# Patient Record
Sex: Male | Born: 1945
Health system: Southern US, Community
[De-identification: ages and names within clinical notes are randomized; demographics above are authoritative.]

## PROBLEM LIST (undated history)

## (undated) DIAGNOSIS — N209 Urinary calculus, unspecified: Secondary | ICD-10-CM

## (undated) DIAGNOSIS — B743 Loiasis: Secondary | ICD-10-CM

## (undated) DIAGNOSIS — G4730 Sleep apnea, unspecified: Secondary | ICD-10-CM

## (undated) DIAGNOSIS — J40 Bronchitis, not specified as acute or chronic: Secondary | ICD-10-CM

## (undated) DIAGNOSIS — M109 Gout, unspecified: Secondary | ICD-10-CM

## (undated) DIAGNOSIS — J449 Chronic obstructive pulmonary disease, unspecified: Secondary | ICD-10-CM

## (undated) DIAGNOSIS — R7303 Prediabetes: Secondary | ICD-10-CM

## (undated) DIAGNOSIS — I499 Cardiac arrhythmia, unspecified: Secondary | ICD-10-CM

## (undated) DIAGNOSIS — M1712 Unilateral primary osteoarthritis, left knee: Secondary | ICD-10-CM

## (undated) DIAGNOSIS — M5410 Radiculopathy, site unspecified: Secondary | ICD-10-CM

## (undated) DIAGNOSIS — H269 Unspecified cataract: Secondary | ICD-10-CM

## (undated) DIAGNOSIS — I4891 Unspecified atrial fibrillation: Secondary | ICD-10-CM

## (undated) DIAGNOSIS — I5033 Acute on chronic diastolic (congestive) heart failure: Secondary | ICD-10-CM

## (undated) DIAGNOSIS — Z6841 Body Mass Index (BMI) 40.0 and over, adult: Secondary | ICD-10-CM

## (undated) DIAGNOSIS — J189 Pneumonia, unspecified organism: Secondary | ICD-10-CM

## (undated) DIAGNOSIS — Z96659 Presence of unspecified artificial knee joint: Secondary | ICD-10-CM

## (undated) DIAGNOSIS — M5137 Other intervertebral disc degeneration, lumbosacral region: Secondary | ICD-10-CM

## (undated) DIAGNOSIS — Z87442 Personal history of urinary calculi: Secondary | ICD-10-CM

## (undated) DIAGNOSIS — M51379 Other intervertebral disc degeneration, lumbosacral region without mention of lumbar back pain or lower extremity pain: Secondary | ICD-10-CM

## (undated) DIAGNOSIS — I10 Essential (primary) hypertension: Secondary | ICD-10-CM

## (undated) HISTORY — DX: Loiasis: B74.3

## (undated) HISTORY — DX: Prediabetes: R73.03

## (undated) HISTORY — DX: Presence of unspecified artificial knee joint: Z96.659

## (undated) HISTORY — DX: Unspecified atrial fibrillation: I48.91

## (undated) HISTORY — DX: Acute on chronic diastolic (congestive) heart failure: I50.33

## (undated) HISTORY — DX: Other intervertebral disc degeneration, lumbosacral region: M51.37

## (undated) HISTORY — PX: KNEE ARTHROSCOPY: SUR90

## (undated) HISTORY — DX: Unilateral primary osteoarthritis, left knee: M17.12

## (undated) HISTORY — DX: Morbid (severe) obesity due to excess calories: E66.01

## (undated) HISTORY — DX: Body Mass Index (BMI) 40.0 and over, adult: Z684

## (undated) HISTORY — DX: Radiculopathy, site unspecified: M54.10

## (undated) HISTORY — DX: Chronic obstructive pulmonary disease, unspecified: J44.9

## (undated) HISTORY — DX: Unspecified cataract: H26.9

## (undated) HISTORY — DX: Other intervertebral disc degeneration, lumbosacral region without mention of lumbar back pain or lower extremity pain: M51.379

## (undated) HISTORY — DX: Gout, unspecified: M10.9

## (undated) HISTORY — PX: CHOLECYSTECTOMY: SHX55

---

## 2000-09-05 ENCOUNTER — Encounter (INDEPENDENT_AMBULATORY_CARE_PROVIDER_SITE_OTHER): Payer: Self-pay | Admitting: *Deleted

## 2000-09-05 ENCOUNTER — Other Ambulatory Visit: Admission: RE | Admit: 2000-09-05 | Discharge: 2000-09-05 | Payer: Self-pay | Admitting: Plastic Surgery

## 2001-03-02 ENCOUNTER — Encounter (HOSPITAL_COMMUNITY): Admission: RE | Admit: 2001-03-02 | Discharge: 2001-04-01 | Payer: Self-pay | Admitting: Rheumatology

## 2001-04-19 HISTORY — PX: TOTAL KNEE ARTHROPLASTY: SHX125

## 2001-04-20 ENCOUNTER — Encounter (HOSPITAL_COMMUNITY): Admission: RE | Admit: 2001-04-20 | Discharge: 2001-05-20 | Payer: Self-pay | Admitting: Rheumatology

## 2001-08-10 ENCOUNTER — Encounter (HOSPITAL_COMMUNITY): Admission: RE | Admit: 2001-08-10 | Discharge: 2001-09-09 | Payer: Self-pay | Admitting: Rheumatology

## 2001-10-12 ENCOUNTER — Encounter (HOSPITAL_COMMUNITY): Admission: RE | Admit: 2001-10-12 | Discharge: 2001-11-11 | Payer: Self-pay | Admitting: Rheumatology

## 2001-12-05 ENCOUNTER — Encounter: Payer: Self-pay | Admitting: Orthopedic Surgery

## 2001-12-11 ENCOUNTER — Inpatient Hospital Stay (HOSPITAL_COMMUNITY): Admission: RE | Admit: 2001-12-11 | Discharge: 2001-12-14 | Payer: Self-pay | Admitting: Orthopedic Surgery

## 2002-12-02 ENCOUNTER — Emergency Department (HOSPITAL_COMMUNITY): Admission: EM | Admit: 2002-12-02 | Discharge: 2002-12-03 | Payer: Self-pay | Admitting: *Deleted

## 2004-02-04 ENCOUNTER — Ambulatory Visit (HOSPITAL_COMMUNITY): Admission: RE | Admit: 2004-02-04 | Discharge: 2004-02-04 | Payer: Self-pay | Admitting: Internal Medicine

## 2004-09-11 ENCOUNTER — Encounter: Admission: RE | Admit: 2004-09-11 | Discharge: 2004-09-11 | Payer: Self-pay | Admitting: Sports Medicine

## 2005-06-23 ENCOUNTER — Encounter: Admission: RE | Admit: 2005-06-23 | Discharge: 2005-06-23 | Payer: Self-pay | Admitting: Family Medicine

## 2005-07-07 ENCOUNTER — Encounter: Admission: RE | Admit: 2005-07-07 | Discharge: 2005-07-07 | Payer: Self-pay | Admitting: Family Medicine

## 2005-07-27 ENCOUNTER — Encounter: Admission: RE | Admit: 2005-07-27 | Discharge: 2005-07-27 | Payer: Self-pay | Admitting: Family Medicine

## 2008-02-05 ENCOUNTER — Ambulatory Visit (HOSPITAL_COMMUNITY): Admission: RE | Admit: 2008-02-05 | Discharge: 2008-02-05 | Payer: Self-pay | Admitting: Family Medicine

## 2008-04-17 ENCOUNTER — Encounter: Admission: RE | Admit: 2008-04-17 | Discharge: 2008-04-17 | Payer: Self-pay | Admitting: Neurosurgery

## 2008-12-31 ENCOUNTER — Ambulatory Visit (HOSPITAL_COMMUNITY): Admission: RE | Admit: 2008-12-31 | Discharge: 2008-12-31 | Payer: Self-pay | Admitting: Family Medicine

## 2009-01-28 ENCOUNTER — Encounter: Admission: RE | Admit: 2009-01-28 | Discharge: 2009-03-10 | Payer: Self-pay | Admitting: Anesthesiology

## 2009-11-26 ENCOUNTER — Encounter: Admission: RE | Admit: 2009-11-26 | Discharge: 2009-11-26 | Payer: Self-pay | Admitting: Anesthesiology

## 2010-03-05 ENCOUNTER — Ambulatory Visit (HOSPITAL_COMMUNITY): Admission: RE | Admit: 2010-03-05 | Discharge: 2010-03-05 | Payer: Self-pay | Admitting: Ophthalmology

## 2010-05-10 ENCOUNTER — Encounter: Payer: Self-pay | Admitting: Family Medicine

## 2010-06-30 LAB — BASIC METABOLIC PANEL
Creatinine, Ser: 0.93 mg/dL (ref 0.4–1.5)
GFR calc non Af Amer: >60 mL/min (ref >60)
Glucose, Bld: 81 mg/dL (ref 70–99)
Sodium: 141 mEq/L (ref 135–145)

## 2010-06-30 LAB — HEMOGLOBIN AND HEMATOCRIT, BLOOD: HCT: 42.5 % (ref 39.0–52.0)

## 2010-09-04 NOTE — Op Note (Signed)
NAME:  Garrett Munoz, Garrett Munoz                         ACCOUNT NO.:  [id number]   MEDICAL RECORD NO.:  [id number]                   PATIENT TYPE:  INP   LOCATION:  5024                                 FACILITY:  [facility]   PHYSICIAN:  Elana Alm. Thurston Hole, M.D.              DATE OF BIRTH:  1945-05-03   DATE OF PROCEDURE:  DATE OF DISCHARGE:                                 OPERATIVE REPORT   PREOPERATIVE DIAGNOSIS:  Right knee degenerative joint disease.   POSTOPERATIVE DIAGNOSIS:  Right  knee degenerative joint disease.   PROCEDURE:  1. Right total knee replacement using Osteonix Scorpio total knee system     with a #9 femoral cemented component, a #11 cemented tibial component     with a 15 mm polyethylene tibial spacer, a 28 mm polyethylene cemented     patella.  2. Right knee lateral retinacular release.   SURGEON:  Elana Alm. Thurston Hole, M.D.   ASSISTANT:  Julien Girt, P.A.   ANESTHESIA:  General.   OPERATIVE TIME:  1 hour and 40 minutes.   COMPLICATIONS:  None.   DESCRIPTION OF PROCEDURE:  The patient was brought to the operating room on  December 11, 2001, and placed on the operating table in the supine position.  After an adequate level of general anesthesia was obtained, his right knee  was examined under anesthesia. The range of motion from 0 to 125 degrees, 1  to 2+ crepitation, moderate varus deformity, knee stable ligamentous exam  with normal patellar tracking. He  had a Foley catheter placed  under  sterile conditions and received Ancef 1 gm IV preoperatively for  prophylaxis. Initially the leg was exsanguinated and a thigh tourniquet was  elevated to 375 mm.   A 20 cm longitudinal incision was placed over the patella. The underlying  subcutaneous tissues were incised along with the skin incision. A median  arthrotomy was performed revealing an excessive amount of normal appearing  joint fluid. The articular surfaces were inspected. He had grade 4 changes  medially,  grade 3 and 4 changes laterally and grade 3 and 4 changes in the  patellofemoral joint. Osteophytes were removed from the femoral condyles and  tibial plateau as well as around the patella.   After this was done the medial and lateral meniscal remnants were removed as  well as the anterior cruciate ligament. An intramedullary was then drilled  up the femoral canal for placement of the distal femoral cutting jig which  was placed in the appropriate amount of rotation and the distal 12 mm cut  was made. The proximal tibia was exposed. The tibial spines were removed  with an oscillating saw. The distal  femur was then further sized. A #9 was  felt to be the appropriate size and a #9 cutting jig was placed and then  these cuts were made.   After this was done, the proximal tibia was again  exposed. The tibial  surface was sized and a #11 was found to be the appropriate size. An  intramedullary drill was then drilled down the tibial canal for placement of  the  proximal tibial cutting jig, and a proximal 10  mm cut was made in the  appropriate amount  of rotation and angulation.   After this was done and the Scorpio PCL cutter was placed back on the distal  femur and these cuts were made. At this point the #9 femoral trial was  hammered into position with an excellent fit. The #11 tibial base plate was  placed with a 15 mm polyethylene spacer. This was found to give excellent  stability, range of motion 0 to 125 degrees with no liftoff on the tray, and  excellent correction of his varus deformity.  The tibial baseplate was then  marked for rotation and the keel cut was made.   After this was done the patella was sized. The 28 mm was found to be the  appropriate size and a recessed 10 x 28 mm cut was made and three locking  holes were placed. After this was done, it was felt that all the trial  components were of excellent size, fit and stability. The knee was then jet  lavage irrigated with  3 liters of saline solution. The proximal tibia was  then exposed and the #11 tibial base plate with cement backing was then  hammered into position with an excellent fit with excess cement being  removed from  around the edges. The #9 femoral component with cement backing  was hammered into position, also with an excellent fit,  with excess cement  being removed from around the edges.   The 15 mm polyethylene  spacer was then locked on the tibial base plate. The  knee was taken through the range of motion, 0 to 120 degrees with excellent  stability and no  liftoff on the tray and excellent correction of  his varus  deformity. The 28 mm patella with cement backing was also locked into its  recessed hole and held there with a clamp.   After the cement hardened,  patellofemoral tracking was evaluated.. There  was some lateral tracking noted and lateral patellofemoral tightness, and  thus a lateral retinacular release was carried out, decompressing the  patellofemoral joint and improving the patellar tracking to normal.   After this was  done, it was felt that all the components were of  excellent  size, fit and stability.  The knee was further irrigated with antibiotic solution. The arthrotomy was  then closed with #1 Ethibond sutures over two medium Hemovac drains. The  subcutaneous tissues were closed with 0 and 2-0 Vicryl. The skin was closed  with skin staples. Sterile dressings were applied. The Hemovac was injected  with 0.25% Marcaine with epinephrine and clamped. The tourniquet was  released. The patient then had a femoral nerve block placed by anesthesia  for postoperative pain control.   He was  then awakened and transferred to the recovery room in stable  condition. Needle and  sponge counts were correct x 2 at the end of the  case.                                                  Robert A. Thurston Hole, M.D.  RAW/MEDQ  D:  12/11/2001  T:  12/12/2001  Job:  [id number]

## 2010-09-04 NOTE — Consult Note (Signed)
[facility]  Patient:    Garrett Munoz, Garrett Munoz Visit Number: [id number] MRN: [id number]          Service Type: RHE Location: [facility] Attending Physician:  Aundra Dubin Dictated by:   Nathaneil Canary, M.D. Proc. Date: 10/11/01 Admit Date:  10/12/2001   CC:         Patrica Duel, M.D.   Consultation Report  CHIEF COMPLAINT:  Right knee, suspected gout.  HISTORY:  The patient returns reporting that his right knee remains his worst joint.  He had an injection to the right knee by Dr. Nobie Putnam about two months ago.  He has few days where the knee does not bother him.  This is basically a chronic every day problem.  He was doing fairly well last week but on Sunday the knee flared up.  He has had two aspirations by myself which show negative gout crystals.  He has some pain in the left knee but no other joints are particularly bothering him.  He has been on some prednisone but this has been stopped for about three to four weeks.  MEDICINES: 1. Colchicine 0.6 mg b.i.d. 2. Bextra 10 mg q.d. 3. Glucosamine. 4. Ambien 10 mg h.s.  PHYSICAL EXAMINATION  VITAL SIGNS:  Weight 305 pounds, blood pressure 136/80, respirations 18.  GENERAL:  no distress.  MUSCULOSKELETAL:  Hands, wrists, elbows, shoulders have a good range of motion and show no arthritis.  BACK:  Nontender.  EXTREMITIES:  The right knee is cool and flexes easily to about 120 degrees and shows no tenderness with flexion beyond this.  There was mild, if any, right joint line tenderness.  The ankles and feet were nontender.  ASSESSMENT AND PLAN:  Right knee.  Mr. Labonte has become frustrated with his lack of improvement with his right knee.  I wanted to x-ray the knee but he said that he brought the x-rays at some point in the past.  He may have done this.  I have no record of what was seen on these if I did look at them.  I will try and obtain the MRI report.  We discussed him going to get the  x-rays but he became frustrated and left the office and did not want to make a return appointment.  I was continuing him on colchicine 0.6 mg b.i.d. for suspected gout to other joints.  He was planned to return in two months and we will see if he is interested. Dictated by:   Nathaneil Canary, M.D. Attending Physician:  Aundra Dubin DD:  10/12/01 TD:  10/13/01 Job: [id number]

## 2010-09-04 NOTE — Consult Note (Signed)
[facility]  Patient:    Garrett Munoz, Garrett Munoz Visit Number: [id number] MRN: [id number]          Service Type: RHE Location: [facility] Attending Physician:  Aundra Dubin Dictated by:   Aundra Dubin, M.D. Proc. Date: 03/02/01 Admit Date:  03/02/2001   CC:         Patrica Duel, M.D., [facility]   Consultation Report  CHIEF COMPLAINT:  "Knees."  Dear Loraine Leriche,  Thank you for allowing me to help in Mr. Dawsons care.  He is a 65 year old man who has had a diagnosis of gout for several years.  Over this last year, he feels that he has worsened and in the last three to four months, he has had probably three to four episodes of acute swelling to the right knee.  He has also had a recent MRI of the right knee and he tells me that there is a "tear."  He is scheduled for arthroscopy with Dr. Elana Alm. Thurston Hole for March 22, 2001.  He has never had a swollen big toe.  Ankles have been involved occasionally, as has a wrist.  He remembers at least several episodes of going to bed, having no pain in any joint, and then awakening with a swollen, hurting joint.  REVIEW OF SYSTEMS:  He feels his weight is up 40 pounds over the last year. His energy level is good.  He does not sleep well and snores frequently.  He is quite stiff with a number of joints, especially the knees in the mornings, but has little pain.  He denies fevers, rashes, headaches, chest pain, shortness of breath, constipation, blood or mucus with the bowel movement.  He has had some diarrhea recently.  He has some moderate back pain.  PAST MEDICAL/SURGICAL HISTORY:  History of irregular heartburn, kidney stones, episode of pneumonia, left knee arthroscopy, 2002, cholecystectomy in 1990.  MEDICINES: 1. Voltaren 75 mg q.d. 2. Darvocet-N 100 q.d. 3. Vitamin E. 4. Glucosamine. 5. Chondroitin.  DRUG INTOLERANCES:  None.  FAMILY HISTORY:  His father died at age 75 with lung cancer.   His mother died at age 35 and she had a history of a stroke.  SOCIAL HISTORY:  He is a [place] native.  He has three grown children.  He worked many years and retired at [employer].  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  He completed high school.  He smoked for 20 years and stopped 15 years or so ago.  He has a glass of wine or beer most nights.  PHYSICAL EXAMINATION:  VITAL SIGNS:  Weight 300 pounds.  Height 5 feet 11 inches.  Blood pressure 132/84; respirations 18; pulse 60, irregular.  GENERAL:  He is significantly overweight and is in no distress.  SKIN:  Clear.  HEENT:  PERRL/EOMI.  Mouth:  Dentures.  No obvious ulcers or petechiae.  NECK:  Negative JVD.  LUNGS:  Clear.  HEART:  Regular with no murmur.  ABDOMEN:  Soft, obese, nontender.  MUSCULOSKELETAL:  The hands, wrists, elbows, shoulders and neck have a good range of motion with mild neck stiffness.  Trigger points in the low back were nontender.  Hips:  Good range of motion.  The right knee is swollen, warm and quite tender, with limited range of motion with flexion to 100 degrees.  The left knee is cool and nontender.  The ankles and feet were nonswollen and nontender.  There was no chronic  swelling to the MTPs.  NEUROLOGIC:  Nonfocal.  PROCEDURE:  Right knee aspiration and injection.  DESCRIPTION OF PROCEDURE:  The skin was cleaned with Betadine and alcohol swabs and cooled with ethyl chloride.  Using a 21-gauge needle, approximately 50 cc of mildly hazy-yellow and fairly clear fluid were removed.  Depo-Medrol 80 mg in 1 cc of 2% lidocaine was injected.  ASSESSMENT AND PLAN: 1. Suspected gout.  Hopefully with the above fluid removed from the right    knee, we can diagnose gout.  He likely has two processes going on to the    knee.  There could be chronic gout attacks but also he has the tear that    has been observed on the MRI.     I will treat this as  gout and have placed him on colchicine 0.6 mg b.i.d.    He can also use Voltaren 75 mg q.d.-- b.i.d. p.r.n.  I will check a    complete blood count, CMET and uric acid.  I have discussed with him    avoiding foods that might precipitate a gout attack.  He has mentioned that    tuna can do this for him.  I believe if he is drinking no more than one or    two glasses or beer or wine per day that this will be acceptable.  2. Right knee swelling.  He is planned for arthroscopy, March 22, 2001.  If    this happens to be more of a chronic gout situation, then this may possibly    improve with the above approach and the injection.  3. Obesity.  4. Insomnia.  His obesity and insomnia may be related and there is some    possibility he could have sleep apnea.  I am not working this up at this    point.  5. History of heart arrhythmia.  Loraine Leriche, I suspect that Mr. Colley does have gout, but I am not seeing a presentation at this point to fully diagnose this.  We may have a diagnosis if the monosodium urate crystals are found with the joint fluid.  I will be seeing him back in two months but before this if joints start swelling.  Thank you.  Sincerely, Dictated by:   Aundra Dubin, M.D. Attending Physician:  Aundra Dubin DD:  03/02/01 TD:  03/02/01 Job: [id number]

## 2010-09-04 NOTE — Consult Note (Signed)
[facility]  Patient:    Garrett Munoz, Garrett Munoz Visit Number: [id number] MRN: [id number]          Service Type: RHE Location: [facility] Attending Physician:  Aundra Dubin Dictated by:   Nathaneil Canary, M.D. Proc. Date: 08/10/01 Admit Date:  08/10/2001   CC:         Patrica Duel, M.D.   Consultation Report  CHIEF COMPLAINT:  Suspected gout.  HISTORY OF PRESENT ILLNESS:  Since seeing the patient in January he has called the office twice reporting that he was having knee swelling. He says that he increases the colchicine to about three or four pills a day and his pain quickly improves. On July 12, 2001, he was also hurting in his knees. I believe he received a cortisone injection through Dr. Lars Pinks office which he improved but now he has worsened. He is aching a great deal, primarily in the bilateral knees. He did undergo right knee arthroscopy in February. It is very sore. His hands, wrists, and elbows are not bothering him. He does have some achiness in his ankles. His weight is up eight pounds. He says he eats out of frustration.  CURRENT MEDICATIONS:  Colchicine 0.6 mg b.i.d., Ambien 10 mg h.s., Bextra 10 mg q.d., glucosamine q.d.  PHYSICAL EXAMINATION:  VITAL SIGNS:  Weight 305 pounds. Blood pressure 130/88, respirations 16.  SKIN:  Clear.  LUNGS:  Clear.  HEART:  Regular.  MUSCULOSKELETAL:  Hands and wrists have a full appearance but are cool and nontender. Elbows extend fully, shoulders good range of motion with mild stiffness. The right knee has the scars consistent with arthroscopy. The knee is warm, there is no effusion. He has moderate tenderness with flexion at 120 degrees. The left knee has slight warmth and very minor tenderness with flexion. The ankles have some slight edema but were cool and nontender. Compression of the MTPs is nontender.  ASSESSMENT AND PLAN: 1. Possible gout. It seems that he still has gout as he improves  very    quickly with the increased colchicine. His uric acid was mildly    elevated at 7.7 in late 2002. The plan is to treat him with prednisone    40 mg, then 30 mg, then 20 mg each for four day intervals, and then    he will continue on 10 mg a day. I have encouraged him to not gain    weight while on the prednisone. I have seen many, many people lose    a few pounds while on these types of doses.     I am increasing the colchicine to 0.6 mg t.i.d. I will start him on    allopurinol 150 mg q.d. on Aug 24, 2001. I will be looking to try    and increase this when he returns. This is still presently not    crystal-proven gout. 2. Recent right knee arthroscopy. He has had several x-rays through    Dr. Clide Cliff office, and he is willing to go and get these x-rays    so I can evaluate them.  He will return in about five weeks. Dictated by:   Nathaneil Canary, M.D. Attending Physician:  Aundra Dubin DD:  08/10/01 TD:  08/10/01 Job: [id number]

## 2010-09-04 NOTE — Consult Note (Signed)
[facility]  Patient:    Garrett Munoz, Garrett Munoz Visit Number: [id number] MRN: [id number]          Service Type: RHE Location: [facility] Attending Physician:  Donnal Moat Dictated by:   Aundra Dubin, M.D. Proc. Date: 03/30/01 Admit Date:  03/02/2001 Discharge Date: 04/01/2001   CC:         Garrett Munoz, M.D.                          Consultation Report  CHIEF COMPLAINT:  Right knee.  HISTORY:  Mr. Garrett Munoz is back in the office today because of a swollen right knee.  This began swelling one and a half to two days ago.  Prior to this he did not feel that he was having any pain in any joints.  For some reason he became confused and had stopped the colchicine.  He does take some Voltaren. He stopped the colchicine about two weeks after I saw him on March 02, 2001.  The joint fluid from the right knee at that time did not show MSU crystals.  His weight is stable.  No other joints are bothering him.  MEDICATIONS: 1. Voltaren 75 mg q.d. 2. Darvocet one q.d. 3. No colchicine presently. 4. Glucosamine one q.d.  PHYSICAL EXAMINATION  VITAL SIGNS:  Weight 300 pounds, blood pressure 116/70, respirations 18.  MUSCULOSKELETAL:  Hands, wrists, elbows, shoulders, left knee, ankles, feet, and toes show no swelling and all are cool and nontender.  The right knee is warm, has moderate effusion, and there is little tenderness until flexion at 120 degrees.  PROCEDURE:  Right knee aspiration and injection.  The skin was cleaned with Betadine and alcohol swabs and cooled with ethyl chloride.  Using a 21 gauge needle 30 cc of mildly hazy yellow fluid was removed.  The viscosity seemed a little thin.  Depo-Medrol 80 mg and 1 cc of 2% lidocaine was injected.  ASSESSMENT AND PLAN: 1. Right knee swelling.  The fluid will be again sent to evaluate for MSU    crystals and cell count.  I have placed him on prednisone for six days    starting at 60 mg.  He will  return to taking colchicine b.i.d. and will    continue this.  He can also use the Voltaren. 2. Obesity.  His weight is stable.  He will return on April 20, 2001. Dictated by:   Aundra Dubin, M.D. Attending Physician:  Donnal Moat DD:  03/30/01 TD:  03/30/01 Job: [id number]

## 2010-09-04 NOTE — Op Note (Signed)
Garrett Munoz, Garrett Munoz               ACCOUNT NO.:  [id number]   MEDICAL RECORD NO.:  [id number]          PATIENT TYPE:  AMB   LOCATION:  DAY                           FACILITY:  [facility]   PHYSICIAN:  Lionel December, M.D.    DATE OF BIRTH:  [birth year]   DATE OF PROCEDURE:  02/04/2004  DATE OF DISCHARGE:                                 OPERATIVE REPORT   PROCEDURE:  Total colonoscopy.   INDICATIONS:  Garrett Munoz is a 65 year old Caucasian male who is here for  screening colonoscopy.  He presently does not have any GI symptoms.  Family  history is negative for colorectal carcinoma.   The procedure risks were reviewed with the patient, and informed consent was  obtained.   PREMEDICATION:  Demerol 50 mg IV, Versed 4 mg IV.   FINDINGS:  Procedure performed in endoscopy suite.  The patient's vital  signs and O2 saturation were monitored during procedure and remained stable.  The patient was placed in the left lateral recumbent position and rectal  examination performed.  No abnormality noted on external or digital exam.  The Olympus video scope was placed in the rectum and advanced under vision  into the sigmoid colon and beyond.  Preparation was satisfactory.  He still  had some liquid stool, which was suctioned out.  The scope was easily passed  into the cecum, which was identified by the appendiceal orifice and  ileocecal valve.  Pictures taken for the record.  As the scope was  withdrawn, colonic mucosa was carefully examined.  There was submucosal  lipoma at mid-transverse colon measuring about 10 x 6 mm.  This was left  alone.  Pictures, however, were taken for the record.  Mucosa of the rest of  the colon was normal.  Rectal mucosa similarly was normal.  Scope was  retroflexed to examine the anorectal junction, which was unremarkable.  The  endoscope was straightened and withdrawn.  The patient tolerated the  procedure well.   FINAL DIAGNOSES:  1.  Normal colonoscopy.  2.  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   RECOMMENDATIONS:  1.  Standard instructions given.  2.  He should continue yearly Hemoccults for occult blood and consider next      screening exam in 10 years from now.     Naje   NR/MEDQ  D:  02/04/2004  T:  02/04/2004  Job:  [id number]   cc:   Patrica Duel, M.D.  [address]  [place]  [place] [address]  Fax: [phone]

## 2010-09-04 NOTE — Discharge Summary (Signed)
NAME:  HERNANDO, REALI NO.:  [id number]   MEDICAL RECORD NO.:  [id number]                   PATIENT TYPE:   LOCATION:                                       FACILITY:   PHYSICIAN:  Elana Alm. Thurston Hole, M.D.              DATE OF BIRTH:  May 21, 1945   DATE OF ADMISSION:  12/11/2001  DATE OF DISCHARGE:  12/14/2001                                 DISCHARGE SUMMARY   ADMISSION DIAGNOSES:  1. 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  2. Hypertension.  3. Irregular heart beat.   DISCHARGE DIAGNOSES:  1. 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  2. Hypertension.  3. History of an irregular heart beat.   HISTORY OF PRESENT ILLNESS:  The patient is a 65 year old white male with a  history of end stage DJD of his right knee. He has tried conservative  treatment including anti-inflammatories and cortisone injections, all  without success. 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 He  understand the risks, benefits and possible complications of a right total  knee replacement.   PROCEDURE:  12/11/2001 - Right total knee replacement by Dr. Thurston Hole.   HOSPITAL COURSE:  On 12/11/2001 the patient underwent a right total knee  replacement by Dr. Thurston Hole. He tolerated the procedure well. Immediately  postoperatively, he had a femoral nerve block by anesthesia; he tolerated  that very well. On postoperative day one the patient began having symptoms  of a gouty flareup. His wife gave him 0.6 mg of Colchicine and he tolerated  this well. The surgical wound was well approximated. His drain was  discontinued. His hemoglobin was 11.8. He was afebrile. His PCA was  discontinued. He was started on p.o. pain medicine and up with physical  therapy. On postoperative day two the patient was progressing well.  His  hemoglobin was 11.5, his BMET was within normal limits. He was still  afebrile. Surgical wound was well approximated. On postoperative day three  the patient had a bowel movement. Hemoglobin was 11.4 and INR was 1.2. He  was metabolically stable. He was afebrile. He was discharged to home after  physical therapy in stable condition.   DISCHARGE MEDICATIONS:  1. Percocet 1-2 q.4-6h. p.r.n. pain, no more than 8 per day.  2. Coumadin 5 mg 2 tablets daily until redraw of Coumadin on 12/14/01.  3. Colace 100 mg one tablet twice a day.  4. Senokot 2 tablets q.h.s.   DISCHARGE INSTRUCTIONS:  1. He was instructed to keep his wound clean and dry.  2. He is weight bearing as tolerated.  3. He is on a regular diet.  4.     He will follow up with Dr. Thurston Hole 12/25/01. He will call with  a temperature     greater than 101.0, increased pain or increased drainage.  5. He was discharged to home in stable condition with home health physical     therapy and home health nursing.     Kirstin Shepperson, P.A.                  Robert A. Thurston Hole, M.D.    KS/MEDQ  D:  01/23/2002  T:  01/26/2002  Job:  [id number]

## 2010-09-04 NOTE — Consult Note (Signed)
[facility]  Patient:    Garrett Munoz, Garrett Munoz Visit Number: [id number] MRN: [id number]          Service Type: RHE Location: [facility] Attending Physician:  Aundra Dubin Dictated by:   Aundra Dubin, M.D. Proc. Date: 04/20/01 Admit Date:  04/20/2001   CC:         Patrica Duel, M.D.   Consultation Report  CHIEF COMPLAINT:  Suspected gout.  HISTORY:  Mr. Sartin right knee synovial fluid of March 30, 2001 again did not show gout crystals.  The synovial fluid sent for a differential count was mishandled and a CBC was run.  He improved very quickly after the injection and prednisone use.  He continues on the b.i.d. colchicine dose.  No other joints are bothering him.  He has some mild achiness to the right knee from a prior injury.  MEDICATIONS: 1. Colchicine 0.6 mg b.i.d. 2. Ambien 10 mg h.s.  PHYSICAL EXAMINATION  VITAL SIGNS:  Weight 297 pounds, blood pressure 140/90, respirations 16.  GENERAL:  No distress.  LUNGS:  Clear.  MUSCULOSKELETAL:  Hands, wrists, elbows, shoulders:  Good range of motion.  No arthritis.  There are no elbow nodules.  The knees flex well and are nontender.  The right knee is significantly less swollen and is nontender. Ankles and feet are nontender.  ASSESSMENT AND PLAN:  Suspected gout.  Although the synovial fluid did not show the gout crystals, again, I still believe that his clinical presentation is consistent with gout.  Also, we are treating only for gout with the colchicine and he is now asymptomatic.  He will continue with the colchicine 0.6 mg b.i.d.  He will return in four months. Dictated by:   Aundra Dubin, M.D. Attending Physician:  Aundra Dubin DD:  04/20/01 TD:  04/20/01 Job: [id number]

## 2011-02-18 HISTORY — PX: EYE SURGERY: SHX253

## 2011-03-20 HISTORY — PX: RETINAL DETACHMENT SURGERY: SHX105

## 2011-07-19 HISTORY — PX: EYE EXAMINATION UNDER ANESTHESIA W/ RETINAL CRYOTHERAPY AND RETINAL LASER: SHX1561

## 2012-01-21 ENCOUNTER — Encounter (HOSPITAL_COMMUNITY): Payer: Self-pay | Admitting: Pharmacy Technician

## 2012-01-26 ENCOUNTER — Other Ambulatory Visit: Payer: Self-pay | Admitting: Physician Assistant

## 2012-01-26 ENCOUNTER — Encounter: Payer: Self-pay | Admitting: Physician Assistant

## 2012-01-26 DIAGNOSIS — M1712 Unilateral primary osteoarthritis, left knee: Secondary | ICD-10-CM | POA: Insufficient documentation

## 2012-01-26 DIAGNOSIS — M5410 Radiculopathy, site unspecified: Secondary | ICD-10-CM | POA: Insufficient documentation

## 2012-01-26 DIAGNOSIS — M109 Gout, unspecified: Secondary | ICD-10-CM

## 2012-01-26 DIAGNOSIS — H269 Unspecified cataract: Secondary | ICD-10-CM

## 2012-01-26 DIAGNOSIS — Z96659 Presence of unspecified artificial knee joint: Secondary | ICD-10-CM

## 2012-01-26 DIAGNOSIS — M5137 Other intervertebral disc degeneration, lumbosacral region: Secondary | ICD-10-CM

## 2012-01-26 DIAGNOSIS — B743 Loiasis: Secondary | ICD-10-CM | POA: Insufficient documentation

## 2012-01-26 NOTE — H&P (Signed)
TOTAL KNEE ADMISSION H&P  Patient is being admitted for left total knee arthroplasty.  Subjective:  Chief Complaint:left knee pain.  HPI: Garrett Munoz, 65 y.o. male, has a history of pain and functional disability in the left knee due to arthritis and has failed non-surgical conservative treatments for greater than 12 weeks to includeNSAID's and/or analgesics, corticosteriod injections, flexibility and strengthening excercises, weight reduction as appropriate and activity modification.  Onset of symptoms was gradual, starting 10 years ago with gradually worsening course since that time. The patient noted prior procedures on the knee to include  arthroscopy and menisectomy on the bilaterally knee(s).  Patient currently rates pain in the left knee(s) at 8 out of 10 with activity. Patient has night pain, worsening of pain with activity and weight bearing, pain that interferes with activities of daily living, crepitus and joint swelling.  Patient has evidence of subchondral sclerosis, periarticular osteophytes and joint space narrowing by imaging studies.  There is no active infection.  Patient Active Problem List   Diagnosis Date Noted  . Gout   . Cataract   . Eye worm   . S/P total knee replacement   . DDD (degenerative disc disease), lumbosacral   . Radicular syndrome of left leg   . Left knee DJD    Past Medical History  Diagnosis Date  . Gout   . Cataract     right eye  . Eye worm     right eye  . S/P total knee replacement     right  . DDD (degenerative disc disease), lumbosacral   . Radicular syndrome of left leg   . Left knee DJD     Past Surgical History  Procedure Date  . Eye surgery 02/2011  . Retinal detachment surgery 03/2011  . Eye examination under anesthesia w/ retinal cryotherapy and retinal laser 07/2011  . Cholecystectomy   . Knee arthroscopy     bilateral  . Total knee arthroplasty 2003    Current Outpatient Prescriptions on File Prior to Visit    Medication Sig Dispense Refill  . allopurinol (ZYLOPRIM) 300 MG tablet Take 300 mg by mouth daily.      . diazepam (VALIUM) 2 MG tablet Take 2 mg by mouth daily.      . fish oil-omega-3 fatty acids 1000 MG capsule Take 1 g by mouth daily.      . HYDROcodone-acetaminophen (LORCET) 10-650 MG per tablet Take 1 tablet by mouth 4 (four) times daily as needed. For pain      . magnesium oxide (MAG-OX) 400 MG tablet Take 400 mg by mouth daily.      . Misc Natural Products (BLACK CHERRY CONCENTRATE PO) Take 1 tablet by mouth daily.      . temazepam (RESTORIL) 30 MG capsule Take 30 mg by mouth at bedtime.        (Not in a hospital admission) No Known Allergies  History  Substance Use Topics  . Smoking status: Never Smoker   . Smokeless tobacco: Not on file  . Alcohol Use: Yes     occasional    Family History  Problem Relation Age of Onset  . Lung cancer Father   . Lung cancer Sister      Review of Systems  Constitutional: Negative.   HENT: Positive for hearing loss. Negative for ear pain, nosebleeds, congestion, sore throat, neck pain, tinnitus and ear discharge.   Eyes: Negative for blurred vision, double vision, photophobia, pain, discharge and redness.  Respiratory: Negative   for cough, hemoptysis, sputum production, shortness of breath, wheezing and stridor.   Cardiovascular: Negative for chest pain, palpitations, orthopnea, claudication, leg swelling and PND.  Gastrointestinal: Negative for heartburn, nausea, vomiting, abdominal pain, diarrhea, constipation, blood in stool and melena.  Genitourinary: Negative for dysuria, urgency, frequency, hematuria and flank pain.  Musculoskeletal: Positive for back pain and joint pain. Negative for myalgias and falls.       Left knee  Skin: Negative.   Neurological: Negative for dizziness, tingling, tremors, sensory change, speech change, focal weakness, seizures, loss of consciousness and headaches.  Endo/Heme/Allergies: Negative for  environmental allergies and polydipsia. Does not bruise/bleed easily.  Psychiatric/Behavioral: Negative for depression, suicidal ideas, hallucinations, memory loss and substance abuse. The patient is not nervous/anxious and does not have insomnia.     Objective:  Physical Exam  Constitutional: He is oriented to person, place, and time. He appears well-developed and well-nourished.  HENT:  Head: Normocephalic and atraumatic.  Mouth/Throat: Oropharynx is clear and moist.  Eyes: Conjunctivae normal and EOM are normal. Pupils are equal, round, and reactive to light.  Neck: Neck supple.  Cardiovascular: Normal rate, regular rhythm and intact distal pulses.   Respiratory: Effort normal and breath sounds normal.  GI: Soft. Bowel sounds are normal.  Genitourinary:       Not pertinent to current symptomatology therefore not examined.  Musculoskeletal:       He is independently ambulatory with a moderately antalgic gait.  Right knee has active range of motion 0-110 degrees.  Well healed total knee incision.  Neurovascularly intact.  Left knee has active range of motion 0-120 degrees with a positive straight leg raise at 70 degrees with pain radiating all the way down his left leg with sciatica.  He has 2+ dorsalis pedis pulses.  He is neurovascularly intact.     Neurological: He is alert and oriented to person, place, and time.  Skin: Skin is warm and dry.  Psychiatric: He has a normal mood and affect. His behavior is normal. Judgment and thought content normal.    Vital signs in last 24 hours:  171/76 bp 97.6 temp 95% O2 sat 52 pulse   Estimated Body mass index is 42.09 kg/(m^2) as calculated from the following:   Height as of this encounter: 5' 9"(1.753 m).   Weight as of this encounter: 285 lb(129.275 kg).   Imaging Review Four view knee shows right total knee replacement in excellent position.  End stage DJD of his left knee with medial compartment bone on bone, varus deformity,  periosteal spurring and subchondral sclerosis.    Plain radiographs demonstrate severe degenerative joint disease of the left knee(s). The overall alignment issignificant varus. The bone quality appears to be good for age and reported activity level.  Assessment/Plan:  End stage arthritis, left knee  Patient Active Problem List  Diagnosis  . Gout  . Cataract  . Eye worm  . S/P total knee replacement  . DDD (degenerative disc disease), lumbosacral  . Radicular syndrome of left leg  . Left knee DJD    The patient history, physical examination, clinical judgment of the provider and imaging studies are consistent with end stage degenerative joint disease of the left knee(s) and total knee arthroplasty is deemed medically necessary. The treatment options including medical management, injection therapy arthroscopy and arthroplasty were discussed at length. The risks and benefits of total knee arthroplasty were presented and reviewed. The risks due to aseptic loosening, infection, stiffness, patella tracking problems, thromboembolic complications and   other imponderables were discussed. The patient acknowledged the explanation, agreed to proceed with the plan and consent was signed. Patient is being admitted for inpatient treatment for surgery, pain control, PT, OT, prophylactic antibiotics, VTE prophylaxis, progressive ambulation and ADL's and discharge planning. The patient is planning to be discharged home with home health services   

## 2012-02-02 ENCOUNTER — Ambulatory Visit (HOSPITAL_COMMUNITY)
Admission: RE | Admit: 2012-02-02 | Discharge: 2012-02-02 | Disposition: A | Discharge status: Home / Self Care | Payer: 59 | Source: Ambulatory Visit | Attending: Physician Assistant | Admitting: Physician Assistant

## 2012-02-02 ENCOUNTER — Encounter (HOSPITAL_COMMUNITY): Payer: Self-pay

## 2012-02-02 ENCOUNTER — Encounter (HOSPITAL_COMMUNITY)
Admission: RE | Admit: 2012-02-02 | Discharge: 2012-02-02 | Disposition: A | Discharge status: Home / Self Care | Payer: 59 | Source: Ambulatory Visit | Attending: Orthopedic Surgery | Admitting: Orthopedic Surgery

## 2012-02-02 DIAGNOSIS — Z01818 Encounter for other preprocedural examination: Secondary | ICD-10-CM | POA: Insufficient documentation

## 2012-02-02 DIAGNOSIS — Z01812 Encounter for preprocedural laboratory examination: Secondary | ICD-10-CM | POA: Insufficient documentation

## 2012-02-02 HISTORY — DX: Urinary calculus, unspecified: N20.9

## 2012-02-02 HISTORY — DX: Cardiac arrhythmia, unspecified: I49.9

## 2012-02-02 HISTORY — DX: Sleep apnea, unspecified: G47.30

## 2012-02-02 HISTORY — DX: Bronchitis, not specified as acute or chronic: J40

## 2012-02-02 HISTORY — DX: Pneumonia, unspecified organism: J18.9

## 2012-02-02 LAB — COMPREHENSIVE METABOLIC PANEL
Albumin: 4.1 g/dL (ref 3.5–5.2)
BUN: 10 mg/dL (ref 6–23)
Calcium: 9.7 mg/dL (ref 8.4–10.5)
Creatinine, Ser: 0.85 mg/dL (ref 0.50–1.35)
Total Protein: 7.9 g/dL (ref 6.0–8.3)

## 2012-02-02 LAB — PROTIME-INR
INR: 0.93 (ref 0.00–1.49)
Prothrombin Time: 12.4 seconds (ref 11.6–15.2)

## 2012-02-02 LAB — URINALYSIS, ROUTINE W REFLEX MICROSCOPIC
Bilirubin Urine: NEGATIVE
Ketones, ur: NEGATIVE mg/dL
Nitrite: NEGATIVE
Urobilinogen, UA: 0.2 mg/dL (ref 0.0–1.0)

## 2012-02-02 LAB — ABO/RH: ABO/RH(D): O POS

## 2012-02-02 LAB — TYPE AND SCREEN: Antibody Screen: NEGATIVE

## 2012-02-02 LAB — SURGICAL PCR SCREEN
MRSA, PCR: NEGATIVE
Staphylococcus aureus: NEGATIVE

## 2012-02-02 LAB — URINE MICROSCOPIC-ADD ON

## 2012-02-02 MED ORDER — CHLORHEXIDINE GLUCONATE 4 % EX LIQD: 60.0000 mL | Freq: Once | CUTANEOUS | Status: DC

## 2012-02-02 NOTE — Pre-Procedure Instructions (Addendum)
18 Garrett Munoz  02/02/2012   Your procedure is scheduled on:  02/07/12  Report to [facility] at 715AM.  Call this number if you have problems the morning of surgery: [phone]   Remember:   Do not eat food or drink:After Midnight.    Take these medicines the morning of surgery with A SIP OF WATER: pain med,STOP black cherry concentrate, fish oil, any nsaids or aspirin now   Do not wear jewelry,   Do not wear lotions, powders, or perfumes. .  Do not shave 48 hours prior to surgery. Men may shave face and neck.  Do not bring valuables to the hospital.  Contacts, dentures or bridgework may not be worn into surgery.  Leave suitcase in the car. After surgery it may be brought to your room.  For patients admitted to the hospital, checkout time is 11:00 AM the day of discharge.   Patients discharged the day of surgery will not be allowed to drive home.  Name and phone number of your driver: sandra wife [phone]  Special Instructions: Incentive Spirometry - Practice and bring it with you on the day of surgery. Shower using CHG 2 nights before surgery and the night before surgery.  If you shower the day of surgery use CHG.  Use special wash - you have one bottle of CHG for all showers.  You should use approximately 1/3 of the bottle for each shower.   Please read over the following fact sheets that you were given: Pain Booklet, Coughing and Deep Breathing, Blood Transfusion Information, Total Joint Packet, MRSA Information and Surgical Site Infection Prevention

## 2012-02-02 NOTE — Progress Notes (Signed)
req'd card studies, notes done 6-7 yrs ago at [facility]

## 2012-02-03 LAB — URINE CULTURE: Culture: NO GROWTH

## 2012-02-03 NOTE — Consult Note (Addendum)
Anesthesia Chart Review: Anesthesia chart review: Patient is a 66 year old male scheduled for left knee total knee replacement by Dr. Thurston Hole on 10/21/131st 2013. History includes prior pneumonia, bronchitis, sleep apnea, gout, cataract extraction '11, prior right total knee replacement '03, kidney stones, history of "irregular heart beat" without mention of afib (documented as early as 2003), obesity.  EKG on 02/02/12 showed sinus bradycardia with marked sinus arrhythmia, cannot rule out inferior infarct, age undetermined.  It was not felt to significantly changed from his previous EKG on 02/26/10.  He has had prior Cardiology evaluation at [facility], but not since 2006.   Chest x-ray on 02/02/2012 showed low lung volumes, no acute abnormality.  Labs noted.  CBC was not done at [facility].  Kirstin Shepperson, PA-C notified.  Patient will come to Short Stay on 02/04/12 for a lab only visit.  I left a message for Mr. Hing to call me so I can inquire further about his "irregular heart beat" history.  Shonna Chock, PA-C 02/03/12 1645  Addendum: 02/04/12 1545 I spoke with Mr. Arita Miss earlier today over the telephone.  He has had an "irregular heart beat" all of his life.  He does not have a history of atrial fibrillation, known MI, chest pain, or SOB.  He said that he saw a cardiologist at [facility] in 2006 for a baseline and reportedly had a normal stress and echo then.  His CBC today was WNL.  He has no known CAD/MI, afib, or DM history.  His EKG is stable for at least the past 2 years.  He denied any CV symptoms.  If no significant change in his status then anticipate he can proceed as planned.

## 2012-02-04 ENCOUNTER — Encounter (HOSPITAL_COMMUNITY)
Admission: RE | Admit: 2012-02-04 | Discharge: 2012-02-04 | Disposition: A | Discharge status: Home / Self Care | Payer: 59 | Source: Ambulatory Visit | Attending: Orthopedic Surgery | Admitting: Orthopedic Surgery

## 2012-02-04 LAB — DIFFERENTIAL
Basophils Absolute: 0 10*3/uL (ref 0.0–0.1)
Basophils Relative: 0 % (ref 0–1)
Eosinophils Absolute: 0.2 10*3/uL (ref 0.0–0.7)
Eosinophils Relative: 2 % (ref 0–5)
Monocytes Absolute: 0.6 10*3/uL (ref 0.1–1.0)
Neutro Abs: 6.3 10*3/uL (ref 1.7–7.7)

## 2012-02-04 LAB — CBC
HCT: 45.2 % (ref 39.0–52.0)
MCH: 32.2 pg (ref 26.0–34.0)
MCHC: 35.2 g/dL (ref 30.0–36.0)
RDW: 13.6 % (ref 11.5–15.5)

## 2012-02-06 MED ORDER — LACTATED RINGERS IV SOLN
INTRAVENOUS | Status: DC
  Administered 2012-02-07: 50 mL/h via INTRAVENOUS

## 2012-02-06 MED ORDER — DEXTROSE 5 % IV SOLN
3.0000 g | INTRAVENOUS | Status: AC
  Administered 2012-02-07: 3 g via INTRAVENOUS
  Filled 2012-02-06: qty 3000

## 2012-02-06 MED ORDER — POVIDONE-IODINE 7.5 % EX SOLN
Freq: Once | CUTANEOUS | Status: DC
  Filled 2012-02-06: qty 118

## 2012-02-07 ENCOUNTER — Ambulatory Visit (HOSPITAL_COMMUNITY): Payer: 59 | Admitting: Vascular Surgery

## 2012-02-07 ENCOUNTER — Inpatient Hospital Stay (HOSPITAL_COMMUNITY)
Admission: RE | Admit: 2012-02-07 | Discharge: 2012-02-08 | DRG: 470 | Disposition: A | Discharge status: Home Health | Payer: 59 | Source: Ambulatory Visit | Attending: Orthopedic Surgery | Admitting: Orthopedic Surgery

## 2012-02-07 ENCOUNTER — Encounter (HOSPITAL_COMMUNITY): Payer: Self-pay | Admitting: Vascular Surgery

## 2012-02-07 ENCOUNTER — Encounter (HOSPITAL_COMMUNITY)
Admission: RE | Disposition: A | Discharge status: Home Health | Payer: Self-pay | Source: Ambulatory Visit | Attending: Orthopedic Surgery

## 2012-02-07 DIAGNOSIS — F411 Generalized anxiety disorder: Secondary | ICD-10-CM | POA: Diagnosis present

## 2012-02-07 DIAGNOSIS — M5137 Other intervertebral disc degeneration, lumbosacral region: Secondary | ICD-10-CM | POA: Diagnosis present

## 2012-02-07 DIAGNOSIS — Z96659 Presence of unspecified artificial knee joint: Secondary | ICD-10-CM

## 2012-02-07 DIAGNOSIS — G4733 Obstructive sleep apnea (adult) (pediatric): Secondary | ICD-10-CM | POA: Diagnosis present

## 2012-02-07 DIAGNOSIS — Z801 Family history of malignant neoplasm of trachea, bronchus and lung: Secondary | ICD-10-CM

## 2012-02-07 DIAGNOSIS — I498 Other specified cardiac arrhythmias: Secondary | ICD-10-CM | POA: Diagnosis present

## 2012-02-07 DIAGNOSIS — I10 Essential (primary) hypertension: Secondary | ICD-10-CM | POA: Diagnosis present

## 2012-02-07 DIAGNOSIS — Z87891 Personal history of nicotine dependence: Secondary | ICD-10-CM

## 2012-02-07 DIAGNOSIS — E669 Obesity, unspecified: Secondary | ICD-10-CM | POA: Diagnosis present

## 2012-02-07 DIAGNOSIS — M1710 Unilateral primary osteoarthritis, unspecified knee: Principal | ICD-10-CM | POA: Diagnosis present

## 2012-02-07 DIAGNOSIS — M1712 Unilateral primary osteoarthritis, left knee: Secondary | ICD-10-CM

## 2012-02-07 DIAGNOSIS — M109 Gout, unspecified: Secondary | ICD-10-CM | POA: Diagnosis present

## 2012-02-07 DIAGNOSIS — M51379 Other intervertebral disc degeneration, lumbosacral region without mention of lumbar back pain or lower extremity pain: Secondary | ICD-10-CM | POA: Diagnosis present

## 2012-02-07 DIAGNOSIS — Z01812 Encounter for preprocedural laboratory examination: Secondary | ICD-10-CM

## 2012-02-07 DIAGNOSIS — M5410 Radiculopathy, site unspecified: Secondary | ICD-10-CM | POA: Diagnosis present

## 2012-02-07 DIAGNOSIS — H269 Unspecified cataract: Secondary | ICD-10-CM | POA: Diagnosis present

## 2012-02-07 HISTORY — PX: TOTAL KNEE ARTHROPLASTY: SHX125

## 2012-02-07 SURGERY — ARTHROPLASTY, KNEE, TOTAL
Anesthesia: General | Site: Knee | Laterality: Left | Wound class: Clean

## 2012-02-07 MED ORDER — OXYCODONE HCL 5 MG PO TABS
5.0000 mg | ORAL_TABLET | ORAL | Status: DC | PRN
  Administered 2012-02-07 – 2012-02-08 (×7): 10 mg via ORAL
  Filled 2012-02-07 (×6): qty 2

## 2012-02-07 MED ORDER — METOCLOPRAMIDE HCL 10 MG PO TABS: 5.0000 mg | ORAL_TABLET | Freq: Three times a day (TID) | ORAL | Status: DC | PRN

## 2012-02-07 MED ORDER — CELECOXIB 200 MG PO CAPS
ORAL_CAPSULE | ORAL | Status: AC
  Administered 2012-02-07: 200 mg via ORAL
  Filled 2012-02-07: qty 1

## 2012-02-07 MED ORDER — ACETAMINOPHEN 650 MG RE SUPP: 650.0000 mg | Freq: Four times a day (QID) | RECTAL | Status: DC | PRN

## 2012-02-07 MED ORDER — LIDOCAINE HCL (CARDIAC) 20 MG/ML IV SOLN
INTRAVENOUS | Status: DC | PRN
  Administered 2012-02-07: 100 mg via INTRAVENOUS

## 2012-02-07 MED ORDER — OXYCODONE HCL 5 MG PO TABS: 5.0000 mg | ORAL_TABLET | Freq: Once | ORAL | Status: DC | PRN

## 2012-02-07 MED ORDER — TEMAZEPAM 15 MG PO CAPS
30.0000 mg | ORAL_CAPSULE | Freq: Every day | ORAL | Status: DC
  Administered 2012-02-07: 30 mg via ORAL
  Filled 2012-02-07: qty 2

## 2012-02-07 MED ORDER — OXYCODONE HCL 5 MG/5ML PO SOLN: 5.0000 mg | Freq: Once | ORAL | Status: DC | PRN

## 2012-02-07 MED ORDER — CEFUROXIME SODIUM 1.5 G IJ SOLR
INTRAMUSCULAR | Status: AC
  Filled 2012-02-07: qty 1.5

## 2012-02-07 MED ORDER — HYDROMORPHONE HCL PF 1 MG/ML IJ SOLN
0.5000 mg | INTRAMUSCULAR | Status: DC | PRN
  Administered 2012-02-07 – 2012-02-08 (×3): 1 mg via INTRAVENOUS
  Filled 2012-02-07 (×3): qty 1

## 2012-02-07 MED ORDER — BUPIVACAINE-EPINEPHRINE 0.25% -1:200000 IJ SOLN
INTRAMUSCULAR | Status: DC | PRN
  Administered 2012-02-07: 30 mL

## 2012-02-07 MED ORDER — CELECOXIB 200 MG PO CAPS
200.0000 mg | ORAL_CAPSULE | Freq: Two times a day (BID) | ORAL | Status: DC
  Administered 2012-02-07 – 2012-02-08 (×2): 200 mg via ORAL
  Filled 2012-02-07 (×3): qty 1

## 2012-02-07 MED ORDER — CEFUROXIME SODIUM 1.5 G IJ SOLR
INTRAMUSCULAR | Status: DC | PRN
  Administered 2012-02-07: 1.5 g

## 2012-02-07 MED ORDER — ACETAMINOPHEN 325 MG PO TABS: 650.0000 mg | ORAL_TABLET | Freq: Four times a day (QID) | ORAL | Status: DC | PRN

## 2012-02-07 MED ORDER — BISACODYL 5 MG PO TBEC
10.0000 mg | DELAYED_RELEASE_TABLET | Freq: Every day | ORAL | Status: DC
  Administered 2012-02-07: 10 mg via ORAL
  Filled 2012-02-07: qty 2

## 2012-02-07 MED ORDER — 0.9 % SODIUM CHLORIDE (POUR BTL) OPTIME
TOPICAL | Status: DC | PRN
  Administered 2012-02-07: 1000 mL

## 2012-02-07 MED ORDER — ACETAMINOPHEN 10 MG/ML IV SOLN
INTRAVENOUS | Status: AC
  Filled 2012-02-07: qty 100

## 2012-02-07 MED ORDER — DEXAMETHASONE SODIUM PHOSPHATE 10 MG/ML IJ SOLN
INTRAMUSCULAR | Status: AC
  Administered 2012-02-07: 10 mg
  Filled 2012-02-07: qty 1

## 2012-02-07 MED ORDER — DOCUSATE SODIUM 100 MG PO CAPS
100.0000 mg | ORAL_CAPSULE | Freq: Two times a day (BID) | ORAL | Status: DC
  Administered 2012-02-07 – 2012-02-08 (×2): 100 mg via ORAL
  Filled 2012-02-07 (×3): qty 1

## 2012-02-07 MED ORDER — CEFAZOLIN SODIUM-DEXTROSE 2-3 GM-% IV SOLR
2.0000 g | Freq: Four times a day (QID) | INTRAVENOUS | Status: AC
  Administered 2012-02-07 (×2): 2 g via INTRAVENOUS
  Filled 2012-02-07 (×3): qty 50

## 2012-02-07 MED ORDER — LACTATED RINGERS IV SOLN
INTRAVENOUS | Status: DC | PRN
  Administered 2012-02-07: 09:00:00 via INTRAVENOUS

## 2012-02-07 MED ORDER — FENTANYL CITRATE 0.05 MG/ML IJ SOLN
INTRAMUSCULAR | Status: AC
  Filled 2012-02-07: qty 2

## 2012-02-07 MED ORDER — MAGNESIUM OXIDE 400 (241.3 MG) MG PO TABS
400.0000 mg | ORAL_TABLET | Freq: Every day | ORAL | Status: DC
  Administered 2012-02-07 – 2012-02-08 (×2): 400 mg via ORAL
  Filled 2012-02-07 (×3): qty 1

## 2012-02-07 MED ORDER — DEXAMETHASONE SODIUM PHOSPHATE 4 MG/ML IJ SOLN
INTRAMUSCULAR | Status: DC | PRN
  Administered 2012-02-07: 4 mg

## 2012-02-07 MED ORDER — PHENOL 1.4 % MT LIQD
1.0000 | OROMUCOSAL | Status: DC | PRN
  Administered 2012-02-07: 1 via OROMUCOSAL
  Filled 2012-02-07: qty 177

## 2012-02-07 MED ORDER — ARTIFICIAL TEARS OP OINT
TOPICAL_OINTMENT | OPHTHALMIC | Status: DC | PRN
  Administered 2012-02-07: 1 via OPHTHALMIC

## 2012-02-07 MED ORDER — BUPIVACAINE-EPINEPHRINE PF 0.5-1:200000 % IJ SOLN
INTRAMUSCULAR | Status: DC | PRN
  Administered 2012-02-07: 25 mL

## 2012-02-07 MED ORDER — NEOSTIGMINE METHYLSULFATE 1 MG/ML IJ SOLN
INTRAMUSCULAR | Status: DC | PRN
  Administered 2012-02-07: 4 mg via INTRAVENOUS

## 2012-02-07 MED ORDER — ALLOPURINOL 300 MG PO TABS
300.0000 mg | ORAL_TABLET | Freq: Every day | ORAL | Status: DC
  Administered 2012-02-07 – 2012-02-08 (×2): 300 mg via ORAL
  Filled 2012-02-07 (×2): qty 1

## 2012-02-07 MED ORDER — ONDANSETRON HCL 4 MG PO TABS: 4.0000 mg | ORAL_TABLET | Freq: Four times a day (QID) | ORAL | Status: DC | PRN

## 2012-02-07 MED ORDER — ENOXAPARIN SODIUM 30 MG/0.3ML ~~LOC~~ SOLN
30.0000 mg | Freq: Two times a day (BID) | SUBCUTANEOUS | Status: DC
  Administered 2012-02-08: 30 mg via SUBCUTANEOUS
  Filled 2012-02-07 (×3): qty 0.3

## 2012-02-07 MED ORDER — MAGNESIUM OXIDE 400 MG PO TABS: 400.0000 mg | ORAL_TABLET | Freq: Every day | ORAL | Status: DC

## 2012-02-07 MED ORDER — GLYCOPYRROLATE 0.2 MG/ML IJ SOLN
INTRAMUSCULAR | Status: DC | PRN
  Administered 2012-02-07: .8 mg via INTRAVENOUS
  Administered 2012-02-07 (×2): 0.2 mg via INTRAVENOUS

## 2012-02-07 MED ORDER — ACETAMINOPHEN 10 MG/ML IV SOLN
1000.0000 mg | Freq: Once | INTRAVENOUS | Status: AC
  Administered 2012-02-07: 1000 mg via INTRAVENOUS
  Filled 2012-02-07: qty 100

## 2012-02-07 MED ORDER — HYDROMORPHONE HCL PF 1 MG/ML IJ SOLN: 0.2500 mg | INTRAMUSCULAR | Status: DC | PRN

## 2012-02-07 MED ORDER — POTASSIUM CHLORIDE IN NACL 20-0.9 MEQ/L-% IV SOLN
INTRAVENOUS | Status: DC
  Administered 2012-02-07: 100 mL via INTRAVENOUS
  Administered 2012-02-08: 03:00:00 via INTRAVENOUS
  Filled 2012-02-07 (×5): qty 1000

## 2012-02-07 MED ORDER — FENTANYL CITRATE 0.05 MG/ML IJ SOLN
INTRAMUSCULAR | Status: DC | PRN
  Administered 2012-02-07 (×5): 50 ug via INTRAVENOUS

## 2012-02-07 MED ORDER — METOCLOPRAMIDE HCL 5 MG/ML IJ SOLN: 5.0000 mg | Freq: Three times a day (TID) | INTRAMUSCULAR | Status: DC | PRN

## 2012-02-07 MED ORDER — DIAZEPAM 2 MG PO TABS
2.0000 mg | ORAL_TABLET | Freq: Three times a day (TID) | ORAL | Status: DC | PRN
  Administered 2012-02-08 (×2): 2 mg via ORAL
  Filled 2012-02-07 (×3): qty 1

## 2012-02-07 MED ORDER — DEXAMETHASONE SODIUM PHOSPHATE 10 MG/ML IJ SOLN
10.0000 mg | Freq: Every day | INTRAMUSCULAR | Status: DC
  Filled 2012-02-07 (×3): qty 1

## 2012-02-07 MED ORDER — MIDAZOLAM HCL 2 MG/2ML IJ SOLN
1.0000 mg | INTRAMUSCULAR | Status: DC | PRN
  Administered 2012-02-07: 2 mg via INTRAVENOUS

## 2012-02-07 MED ORDER — OXYCODONE HCL 5 MG PO TABS
ORAL_TABLET | ORAL | Status: AC
  Filled 2012-02-07: qty 2

## 2012-02-07 MED ORDER — MENTHOL 3 MG MT LOZG: 1.0000 | LOZENGE | OROMUCOSAL | Status: DC | PRN

## 2012-02-07 MED ORDER — DEXAMETHASONE 6 MG PO TABS
10.0000 mg | ORAL_TABLET | Freq: Every day | ORAL | Status: DC
  Administered 2012-02-07 – 2012-02-08 (×2): 10 mg via ORAL
  Filled 2012-02-07 (×3): qty 1

## 2012-02-07 MED ORDER — BUPIVACAINE-EPINEPHRINE PF 0.25-1:200000 % IJ SOLN
INTRAMUSCULAR | Status: AC
  Filled 2012-02-07: qty 30

## 2012-02-07 MED ORDER — ROCURONIUM BROMIDE 100 MG/10ML IV SOLN
INTRAVENOUS | Status: DC | PRN
  Administered 2012-02-07: 50 mg via INTRAVENOUS

## 2012-02-07 MED ORDER — FENTANYL CITRATE 0.05 MG/ML IJ SOLN
50.0000 ug | Freq: Once | INTRAMUSCULAR | Status: AC
  Administered 2012-02-07: 100 ug via INTRAVENOUS

## 2012-02-07 MED ORDER — MIDAZOLAM HCL 2 MG/2ML IJ SOLN
INTRAMUSCULAR | Status: AC
  Filled 2012-02-07: qty 2

## 2012-02-07 MED ORDER — PROMETHAZINE HCL 25 MG/ML IJ SOLN: 6.2500 mg | INTRAMUSCULAR | Status: DC | PRN

## 2012-02-07 MED ORDER — ONDANSETRON HCL 4 MG/2ML IJ SOLN: 4.0000 mg | Freq: Four times a day (QID) | INTRAMUSCULAR | Status: DC | PRN

## 2012-02-07 MED ORDER — ONDANSETRON HCL 4 MG/2ML IJ SOLN
INTRAMUSCULAR | Status: DC | PRN
  Administered 2012-02-07: 4 mg via INTRAVENOUS

## 2012-02-07 MED ORDER — PROPOFOL 10 MG/ML IV BOLUS
INTRAVENOUS | Status: DC | PRN
  Administered 2012-02-07: 200 mg via INTRAVENOUS

## 2012-02-07 SURGICAL SUPPLY — 77 items
BANDAGE ESMARK 6X9 LF (GAUZE/BANDAGES/DRESSINGS) ×1 IMPLANT
BLADE SAGITTAL 25.0X1.19X90 (BLADE) ×2 IMPLANT
BLADE SAW SGTL 11.0X1.19X90.0M (BLADE) IMPLANT
BLADE SAW SGTL 13.0X1.19X90.0M (BLADE) ×2 IMPLANT
BLADE SURG 10 STRL SS (BLADE) ×4 IMPLANT
BNDG CMPR 9X6 STRL LF SNTH (GAUZE/BANDAGES/DRESSINGS) ×1
BNDG CMPR MED 15X6 ELC VLCR LF (GAUZE/BANDAGES/DRESSINGS) ×1
BNDG ELASTIC 6X15 VLCR STRL LF (GAUZE/BANDAGES/DRESSINGS) ×2 IMPLANT
BNDG ESMARK 6X9 LF (GAUZE/BANDAGES/DRESSINGS) ×2
BOWL SMART MIX CTS (DISPOSABLE) ×2 IMPLANT
CEMENT HV SMART SET (Cement) ×4 IMPLANT
CLOTH BEACON ORANGE TIMEOUT ST (SAFETY) ×2 IMPLANT
CLSR STERI-STRIP ANTIMIC 1/2X4 (GAUZE/BANDAGES/DRESSINGS) ×1 IMPLANT
COVER BACK TABLE 24X17X13 BIG (DRAPES) IMPLANT
COVER PROBE W GEL 5X96 (DRAPES) ×2 IMPLANT
COVER SURGICAL LIGHT HANDLE (MISCELLANEOUS) ×2 IMPLANT
CUFF TOURNIQUET SINGLE 34IN LL (TOURNIQUET CUFF) ×2 IMPLANT
CUFF TOURNIQUET SINGLE 44IN (TOURNIQUET CUFF) IMPLANT
DRAPE EXTREMITY T 121X128X90 (DRAPE) ×2 IMPLANT
DRAPE INCISE IOBAN 66X45 STRL (DRAPES) ×2 IMPLANT
DRAPE PROXIMA HALF (DRAPES) ×2 IMPLANT
DRAPE U-SHAPE 47X51 STRL (DRAPES) ×2 IMPLANT
DRSG ADAPTIC 3X8 NADH LF (GAUZE/BANDAGES/DRESSINGS) ×2 IMPLANT
DRSG PAD ABDOMINAL 8X10 ST (GAUZE/BANDAGES/DRESSINGS) ×4 IMPLANT
DURAPREP 26ML APPLICATOR (WOUND CARE) ×2 IMPLANT
ELECT CAUTERY BLADE 6.4 (BLADE) ×3 IMPLANT
ELECT REM PT RETURN 9FT ADLT (ELECTROSURGICAL) ×2
ELECTRODE REM PT RTRN 9FT ADLT (ELECTROSURGICAL) ×1 IMPLANT
EVACUATOR 1/8 PVC DRAIN (DRAIN) ×1 IMPLANT
FACESHIELD LNG OPTICON STERILE (SAFETY) ×2 IMPLANT
GLOVE BIO SURGEON STRL SZ7 (GLOVE) ×2 IMPLANT
GLOVE BIOGEL PI IND STRL 6.5 (GLOVE) IMPLANT
GLOVE BIOGEL PI IND STRL 7.0 (GLOVE) ×1 IMPLANT
GLOVE BIOGEL PI IND STRL 7.5 (GLOVE) ×1 IMPLANT
GLOVE BIOGEL PI INDICATOR 6.5 (GLOVE) ×1
GLOVE BIOGEL PI INDICATOR 7.0 (GLOVE) ×1
GLOVE BIOGEL PI INDICATOR 7.5 (GLOVE) ×1
GLOVE BIOGEL PI ORTHO PRO SZ7 (GLOVE) ×1
GLOVE ECLIPSE 6.5 STRL STRAW (GLOVE) ×1 IMPLANT
GLOVE PI ORTHO PRO STRL SZ7 (GLOVE) IMPLANT
GLOVE SS BIOGEL STRL SZ 7.5 (GLOVE) ×1 IMPLANT
GLOVE SUPERSENSE BIOGEL SZ 7.5 (GLOVE) ×1
GLOVE SURG SS PI 7.0 STRL IVOR (GLOVE) ×1 IMPLANT
GOWN PREVENTION PLUS XLARGE (GOWN DISPOSABLE) ×4 IMPLANT
GOWN STRL NON-REIN LRG LVL3 (GOWN DISPOSABLE) ×4 IMPLANT
HANDPIECE INTERPULSE COAX TIP (DISPOSABLE) ×2
HOOD PEEL AWAY FACE SHEILD DIS (HOOD) ×4 IMPLANT
IMMOBILIZER KNEE 22 UNIV (SOFTGOODS) ×1 IMPLANT
INSERT CUSHION PRONEVIEW LG (MISCELLANEOUS) ×2 IMPLANT
KIT BASIN OR (CUSTOM PROCEDURE TRAY) ×2 IMPLANT
KIT ROOM TURNOVER OR (KITS) ×2 IMPLANT
MANIFOLD NEPTUNE II (INSTRUMENTS) ×2 IMPLANT
NS IRRIG 1000ML POUR BTL (IV SOLUTION) ×2 IMPLANT
PACK TOTAL JOINT (CUSTOM PROCEDURE TRAY) ×2 IMPLANT
PAD ARMBOARD 7.5X6 YLW CONV (MISCELLANEOUS) ×4 IMPLANT
PAD CAST 4YDX4 CTTN HI CHSV (CAST SUPPLIES) ×1 IMPLANT
PADDING CAST COTTON 4X4 STRL (CAST SUPPLIES) ×2
PADDING CAST COTTON 6X4 STRL (CAST SUPPLIES) ×2 IMPLANT
POSITIONER HEAD PRONE TRACH (MISCELLANEOUS) ×2 IMPLANT
RUBBERBAND STERILE (MISCELLANEOUS) ×3 IMPLANT
SET HNDPC FAN SPRY TIP SCT (DISPOSABLE) ×1 IMPLANT
SHIELD SPLASH 9X12 PIC/PGM (MISCELLANEOUS) ×1 IMPLANT
SPONGE GAUZE 4X4 12PLY (GAUZE/BANDAGES/DRESSINGS) ×2 IMPLANT
STRIP CLOSURE SKIN 1/2X4 (GAUZE/BANDAGES/DRESSINGS) ×3 IMPLANT
SUCTION FRAZIER TIP 10 FR DISP (SUCTIONS) ×2 IMPLANT
SUT ETHIBOND NAB CT1 #1 30IN (SUTURE) ×3 IMPLANT
SUT MNCRL AB 3-0 PS2 18 (SUTURE) ×2 IMPLANT
SUT VIC AB 0 CT1 27 (SUTURE) ×4
SUT VIC AB 0 CT1 27XBRD ANBCTR (SUTURE) ×2 IMPLANT
SUT VIC AB 2-0 CT1 27 (SUTURE) ×4
SUT VIC AB 2-0 CT1 TAPERPNT 27 (SUTURE) ×2 IMPLANT
SUT VLOC 180 0 24IN GS25 (SUTURE) ×2 IMPLANT
SYR 30ML SLIP (SYRINGE) ×3 IMPLANT
TOWEL OR 17X24 6PK STRL BLUE (TOWEL DISPOSABLE) ×2 IMPLANT
TOWEL OR 17X26 10 PK STRL BLUE (TOWEL DISPOSABLE) ×2 IMPLANT
TRAY FOLEY CATH 14FR (SET/KITS/TRAYS/PACK) ×2 IMPLANT
WATER STERILE IRR 1000ML POUR (IV SOLUTION) ×6 IMPLANT

## 2012-02-07 NOTE — OR Nursing (Signed)
A/o x 3 / hemodynamically stable but has some marked brady cardia into 30's / Dr Randa Evens notified. Will see

## 2012-02-07 NOTE — Progress Notes (Signed)
UR COMPLETED  

## 2012-02-07 NOTE — H&P (View-Only) (Signed)
TOTAL KNEE ADMISSION H&P  Patient is being admitted for left total knee arthroplasty.  Subjective:  Chief Complaint:left knee pain.  HPI: Garrett Munoz, 66 y.o. male, has a history of pain and functional disability in the left knee due to arthritis and has failed non-surgical conservative treatments for greater than 12 weeks to includeNSAID's and/or analgesics, corticosteriod injections, flexibility and strengthening excercises, weight reduction as appropriate and activity modification.  Onset of symptoms was gradual, starting 10 years ago with gradually worsening course since that time. The patient noted prior procedures on the knee to include  arthroscopy and menisectomy on the bilaterally knee(s).  Patient currently rates pain in the left knee(s) at 8 out of 10 with activity. Patient has night pain, worsening of pain with activity and weight bearing, pain that interferes with activities of daily living, crepitus and joint swelling.  Patient has evidence of subchondral sclerosis, periarticular osteophytes and joint space narrowing by imaging studies.  There is no active infection.  Patient Active Problem List   Diagnosis Date Noted  . Gout   . Cataract   . Eye worm   . S/P total knee replacement   . DDD (degenerative disc disease), lumbosacral   . Radicular syndrome of left leg   . Left knee DJD    Past Medical History  Diagnosis Date  . Gout   . Cataract     right eye  . Eye worm     right eye  . S/P total knee replacement     right  . DDD (degenerative disc disease), lumbosacral   . Radicular syndrome of left leg   . Left knee DJD     Past Surgical History  Procedure Date  . Eye surgery 02/2011  . Retinal detachment surgery 03/2011  . Eye examination under anesthesia w/ retinal cryotherapy and retinal laser 07/2011  . Cholecystectomy   . Knee arthroscopy     bilateral  . Total knee arthroplasty 2003    Current Outpatient Prescriptions on File Prior to Visit    Medication Sig Dispense Refill  . allopurinol (ZYLOPRIM) 300 MG tablet Take 300 mg by mouth daily.      . diazepam (VALIUM) 2 MG tablet Take 2 mg by mouth daily.      . fish oil-omega-3 fatty acids 1000 MG capsule Take 1 g by mouth daily.      [name] HYDROcodone-acetaminophen (LORCET) 10-650 MG per tablet Take 1 tablet by mouth 4 (four) times daily as needed. For pain      . magnesium oxide (MAG-OX) 400 MG tablet Take 400 mg by mouth daily.      . Misc Natural Products (BLACK CHERRY CONCENTRATE PO) Take 1 tablet by mouth daily.      . temazepam (RESTORIL) 30 MG capsule Take 30 mg by mouth at bedtime.        (Not in a hospital admission) No Known Allergies  History  Substance Use Topics  . Smoking status: Never Smoker   . Smokeless tobacco: Not on file  . Alcohol Use: Yes     occasional    Family History  Problem Relation Age of Onset  . Lung cancer Father   . Lung cancer Sister      Review of Systems  Constitutional: Negative.   HENT: Positive for hearing loss. Negative for ear pain, nosebleeds, congestion, sore throat, neck pain, tinnitus and ear discharge.   Eyes: Negative for blurred vision, double vision, photophobia, pain, discharge and redness.  Respiratory: Negative  for cough, hemoptysis, sputum production, shortness of breath, wheezing and stridor.   Cardiovascular: Negative for chest pain, palpitations, orthopnea, claudication, leg swelling and PND.  Gastrointestinal: Negative for heartburn, nausea, vomiting, abdominal pain, diarrhea, constipation, blood in stool and melena.  Genitourinary: Negative for dysuria, urgency, frequency, hematuria and flank pain.  Musculoskeletal: Positive for back pain and joint pain. Negative for myalgias and falls.       Left knee  Skin: Negative.   Neurological: Negative for dizziness, tingling, tremors, sensory change, speech change, focal weakness, seizures, loss of consciousness and headaches.  Endo/Heme/Allergies: Negative for  environmental allergies and polydipsia. Does not bruise/bleed easily.  Psychiatric/Behavioral: Negative for depression, suicidal ideas, hallucinations, memory loss and substance abuse. The patient is not nervous/anxious and does not have insomnia.     Objective:  Physical Exam  Constitutional: He is oriented to person, place, and time. He appears well-developed and well-nourished.  HENT:  Head: Normocephalic and atraumatic.  Mouth/Throat: Oropharynx is clear and moist.  Eyes: Conjunctivae normal and EOM are normal. Pupils are equal, round, and reactive to light.  Neck: Neck supple.  Cardiovascular: Normal rate, regular rhythm and intact distal pulses.   Respiratory: Effort normal and breath sounds normal.  GI: Soft. Bowel sounds are normal.  Genitourinary:       Not pertinent to current symptomatology therefore not examined.  Musculoskeletal:       He is independently ambulatory with a moderately antalgic gait.  Right knee has active range of motion 0-110 degrees.  Well healed total knee incision.  Neurovascularly intact.  Left knee has active range of motion 0-120 degrees with a positive straight leg raise at 70 degrees with pain radiating all the way down his left leg with sciatica.  He has 2+ dorsalis pedis pulses.  He is neurovascularly intact.     Neurological: He is alert and oriented to person, place, and time.  Skin: Skin is warm and dry.  Psychiatric: He has a normal mood and affect. His behavior is normal. Judgment and thought content normal.    Vital signs in last 24 hours:  171/76 bp 97.6 temp 95% O2 sat 52 pulse   Estimated Body mass index is 42.09 kg/(m^2) as calculated from the following:   Height as of this encounter: 5\' 9" (1.753 m).   Weight as of this encounter: 285 lb(129.275 kg).   Imaging Review Four view knee shows right total knee replacement in excellent position.  End stage DJD of his left knee with medial compartment bone on bone, varus deformity,  periosteal spurring and subchondral sclerosis.    Plain radiographs demonstrate severe degenerative joint disease of the left knee(s). The overall alignment issignificant varus. The bone quality appears to be good for age and reported activity level.  Assessment/Plan:  End stage arthritis, left knee  Patient Active Problem List  Diagnosis  . Gout  . Cataract  . Eye worm  . S/P total knee replacement  . DDD (degenerative disc disease), lumbosacral  . Radicular syndrome of left leg  . Left knee DJD    The patient history, physical examination, clinical judgment of the provider and imaging studies are consistent with end stage degenerative joint disease of the left knee(s) and total knee arthroplasty is deemed medically necessary. The treatment options including medical management, injection therapy arthroscopy and arthroplasty were discussed at length. The risks and benefits of total knee arthroplasty were presented and reviewed. The risks due to aseptic loosening, infection, stiffness, patella tracking problems, thromboembolic complications and  other imponderables were discussed. The patient acknowledged the explanation, agreed to proceed with the plan and consent was signed. Patient is being admitted for inpatient treatment for surgery, pain control, PT, OT, prophylactic antibiotics, VTE prophylaxis, progressive ambulation and ADL's and discharge planning. The patient is planning to be discharged home with home health services

## 2012-02-07 NOTE — Consult Note (Signed)
Reason for Consult:  Bradycardia Referring Physician:  [name] is an 66 y.o. male.  HPI:   The patient is a 66 yo obese male who has intentionally lost 50 pounds in the last six months or so with increased exercise and dietary modification.  His history includes irregular HR all his life, OSA, for which he does not use a CPAP, HTN (taken off BP meds because of improvements), Gout, OA, Anxiety.  He was last seen by Dr. Domingo Munoz on July 15, 2004.  He just had a left total knee arthroplasty.  We are ask to see the patient for bradycardia.  He is a patient at [facility].  The patients states he has had an irregular HR for all his life.  He does have LEE.  He denies N, V, fever, CP, dizziness, SOB, orthopnea, PND, Abd pain, dysuria, cough, congestion.    Past Medical History  Diagnosis Date  . Gout   . Cataract     right eye  . Eye worm     right eye  . S/P total knee replacement     right  . DDD (degenerative disc disease), lumbosacral   . Radicular syndrome of left leg   . Left knee DJD   . Bronchitis     hx  . Pneumonia     hx  . Sleep apnea     can't use sleep study 10 yrs ago  . Dysrhythmia     irregular heatbeat  . 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 Retinal detachment surgery 03/2011    rt  . Eye examination under anesthesia w/ retinal cryotherapy and retinal laser 07/2011  . Cholecystectomy   . Knee arthroscopy     bilateral  . Total knee arthroplasty 2003    rt  . Eye surgery 02/2011    cat rt    Family History  Problem Relation Age of Onset  . Lung cancer Father   . Lung cancer Sister     Social History:  reports that he quit smoking about 20 years ago. His smoking use included Cigarettes. He has a 30 pack-year smoking history. He does not have any smokeless tobacco history on file. He reports that he drinks alcohol. He reports that he does not use illicit drugs.  Allergies: No Known  Allergies  Medications:    . acetaminophen  1,000 mg Intravenous Once  . allopurinol  300 mg Oral Daily  . bisacodyl  10 mg Oral QAC supper  .  ceFAZolin (ANCEF) IV  3 g Intravenous 60 min Pre-Op  .  ceFAZolin (ANCEF) IV  2 g Intravenous Q6H  . celecoxib  200 mg Oral Q12H  . dexamethasone      . dexamethasone  10 mg Oral Q breakfast   Or  . dexamethasone  10 mg Intravenous Q breakfast  . docusate sodium  100 mg Oral BID  . enoxaparin (LOVENOX) injection  30 mg Subcutaneous Q12H  . fentaNYL  50-100 mcg Intravenous Once  . magnesium oxide  400 mg Oral Daily  . oxyCODONE      . temazepam  30 mg Oral QHS  . DISCONTD: magnesium oxide  400 mg Oral Daily  . DISCONTD: povidone-iodine   Topical Once     No results found for this or any previous visit (from the past 48 hour(s)).  No results found.  Review of Systems  Constitutional: Negative for  fever.  HENT: Negative for congestion and sore throat.   Eyes: Negative for blurred vision.  Respiratory: Negative for cough and shortness of breath.   Cardiovascular: Negative for chest pain, palpitations, orthopnea, leg swelling and PND.  Gastrointestinal: Negative for nausea, vomiting, abdominal pain, diarrhea and constipation.  Genitourinary: Negative for dysuria.  Musculoskeletal: Negative for myalgias.  Neurological: Negative for dizziness and weakness.   Blood pressure 150/68, pulse 49, temperature 97.6 F (36.4 C), temperature source Oral, resp. rate 12, SpO2 95.00%. Physical Exam  Constitutional: He is oriented to person, place, and time. He appears well-developed. No distress.  HENT:  Head: Normocephalic and atraumatic.  Eyes: EOM are normal. Pupils are equal, round, and reactive to light. No scleral icterus.  Neck: Normal range of motion. Neck supple.  Cardiovascular: S1 normal and S2 normal.  An irregularly irregular rhythm present. Bradycardia present.   No murmur heard. Pulses:      Radial pulses are 2+ on the right  side, and 2+ on the left side.       Dorsalis pedis pulses are 2+ on the right side. Left dorsalis pedis pulse not accessible.       No carotid Bruits.  Respiratory: Effort normal and breath sounds normal. He has no wheezes. He has no rales.  GI: Soft. Bowel sounds are normal. He exhibits no distension. There is no tenderness.  Musculoskeletal:       1+ left LEE   Lymphadenopathy:    He has no cervical adenopathy.  Neurological: He is oriented to person, place, and time. He exhibits normal muscle tone.  Skin: Skin is warm and dry.  Psychiatric: He has a normal mood and affect.    Assessment/Plan: Patient Active Hospital Problem List: Left knee DJD () Gout () S/P total knee replacement () DDD (degenerative disc disease), lumbosacral () Radicular syndrome of left leg ()  Plan:  S/P left total knee today.  EKG from 10/16 shows sinus bradycardia and irregular.  Tele looks like he is in and out of afib or second degree block.   Getting EKG to confirm.   HR range 40-72.  BP elevated.  Ordering Echo.  EKG:  Sinus brady. Rate 58. Irregular.  Sinus node dysfunction.  Q in II, aVF.   He is not on any rate controlling meds and apparently this is a chronic issue.  He is asymptomatic.  Actually, his wife just told me he does get SOB at times particularly with exertion.   Garrett Munoz, Garrett Munoz 02/07/2012, 2:57 PM    Patient seen and examined. Agree with assessment and plan. Very pleasant 67 yo WM with history of prior HTN, OSA untreated due to previous intolerance to FFM. He had a normal myoview scan in 2005 with inferior thinning. He has had an irregular heart rate noted in past. Today he underwent Left TKR and developed transient sinus arrythmia with asymptomatic bradycardia noted. ECG suggests possible inferior Q waves; no known history of MI.  He is not a on any rate controlling meds. At present will check 2d- echo. Ultimately, he would benefit from a myoview scan once stable from knee surgery as an  outpatient.  I also discussed with him the potential adverse cardiovascular complications associated with untreated sleep apnea. He has lost 50 lbs which will undoubtedly be helpful, but a with new mask technology, a f/u sleep study may be worthwhile in the future to readdress this issue.   Lennette Bihari, MD, [facility] 02/07/2012 5:05 PM

## 2012-02-07 NOTE — Preoperative (Signed)
Beta Blockers   Reason not to administer Beta Blockers: not prescribed 

## 2012-02-07 NOTE — OR Nursing (Signed)
Garrett Munoz notified of  Hr concerns/ will place in tele and  Have [facility] see for f/u

## 2012-02-07 NOTE — Anesthesia Preprocedure Evaluation (Addendum)
Anesthesia Evaluation  Patient identified by MRN, date of birth, ID band Patient awake    Reviewed: Allergy & Precautions, H&P , NPO status , Patient's Chart, lab work & pertinent test results  Airway Mallampati: II TM Distance: >3 FB Neck ROM: Full    Dental   Pulmonary sleep apnea , former smoker,  breath sounds clear to auscultation        Cardiovascular Rhythm:Regular Rate:Normal     Neuro/Psych  Neuromuscular disease    GI/Hepatic   Endo/Other  Morbid obesity  Renal/GU      Musculoskeletal   Abdominal (+) + obese,   Peds  Hematology   Anesthesia Other Findings   Reproductive/Obstetrics                          Anesthesia Physical Anesthesia Plan  ASA: III  Anesthesia Plan: General   Post-op Pain Management:    Induction: Intravenous  Airway Management Planned: Oral ETT  Additional Equipment:   Intra-op Plan:   Post-operative Plan: Extubation in OR  Informed Consent: I have reviewed the patients History and Physical, chart, labs and discussed the procedure including the risks, benefits and alternatives for the proposed anesthesia with the patient or authorized representative who has indicated his/her understanding and acceptance.     Plan Discussed with: CRNA and Surgeon  Anesthesia Plan Comments:         Anesthesia Quick Evaluation

## 2012-02-07 NOTE — Interval H&P Note (Signed)
History and Physical Interval Note:  02/07/2012 8:59 AM  Garrett Munoz  has presented today for surgery, with the diagnosis of DJD LEFT KNEE  The various methods of treatment have been discussed with the patient and family. After consideration of risks, benefits and other options for treatment, the patient has consented to  Procedure(s) (LRB) with comments: TOTAL KNEE ARTHROPLASTY (Left) as a surgical intervention .  The patient's history has been reviewed, patient examined, no change in status, stable for surgery.  I have reviewed the patient's chart and labs.  Questions were answered to the patient's satisfaction.     Salvatore Marvel A

## 2012-02-07 NOTE — Op Note (Signed)
MRN:     [id number] DOB/AGE:    68/14/1947 / 68 y.o.       OPERATIVE REPORT    DATE OF PROCEDURE:  02/07/2012       PREOPERATIVE DIAGNOSIS:   DJD LEFT KNEE      There is no height or weight on file to calculate BMI.                                                        POSTOPERATIVE DIAGNOSIS:   DJD LEFT KNEE                                                                      PROCEDURE:  Procedure(s): TOTAL KNEE ARTHROPLASTY Using Depuy Sigma RP implants #4 Femur, #4Tibia, 12.63mm sigma RP bearing, 35 Patella     SURGEON: Ulysee Fyock A    ASSISTANT:  Kirstin Shepperson PA-C   (Present and scrubbed throughout the case, critical for assistance with exposure, retraction, instrumentation, and closure.)         ANESTHESIA: GET with Femoral Nerve Block  DRAINS: foley, 2 medium hemovac in knee   TOURNIQUET TIME:   COMPLICATIONS:  None     SPECIMENS: None   INDICATIONS FOR PROCEDURE: The patient has  DJD LEFT KNEE, varus deformities, XR shows bone on bone arthritis. Patient has failed all conservative measures including anti-inflammatory medicines, narcotics, attempts at  exercise and weight loss, cortisone injections and viscosupplementation.  Risks and benefits of surgery have been discussed, questions answered.   DESCRIPTION OF PROCEDURE: The patient identified by armband, received  right femoral nerve block and IV antibiotics, in the holding area at [facility]. Patient taken to the operating room, appropriate anesthetic  monitors were attached General endotracheal anesthesia induced with  the patient in supine position, Foley catheter was inserted. Tourniquet  applied high to the operative thigh. Lateral post and foot positioner  applied to the table, the lower extremity was then prepped and draped  in usual sterile fashion from the ankle to the tourniquet. Time-out procedure was performed. The limb was wrapped with an Esmarch bandage and the tourniquet inflated to  365 mmHg. We began the operation by making the anterior midline incision starting at handbreadth above the patella going over the patella 1 cm medial to and  4 cm distal to the tibial tubercle. Small bleeders in the skin and the  subcutaneous tissue identified and cauterized. Transverse retinaculum was incised and reflected medially and a medial parapatellar arthrotomy was accomplished. the patella was everted and theprepatellar fat pad resected. The superficial medial collateral  ligament was then elevated from anterior to posterior along the proximal  flare of the tibia and anterior half of the menisci resected. The knee was hyperflexed exposing bone on bone arthritis. Peripheral and notch osteophytes as well as the cruciate ligaments were then resected. We continued to  work our way around posteriorly along the proximal tibia, and externally  rotated the tibia subluxing it out from underneath the femur. A McHale  retractor was placed through the notch and a lateral  Hohmann retractor  placed, and we then drilled through the proximal tibia in line with the  axis of the tibia followed by an intramedullary guide rod and 2-degree  posterior slope cutting guide. The tibial cutting guide was pinned into place  allowing resection of 4 mm of bone medially and about 7 mm of bone  laterally because of her varus deformity. Satisfied with the tibial resection, we then  entered the distal femur 2 mm anterior to the PCL origin with the  intramedullary guide rod and applied the distal femoral cutting guide  set at 11mm, with 5 degrees of valgus. This was pinned along the  epicondylar axis. At this point, the distal femoral cut was accomplished without difficulty. We then sized for a #4 femoral component and pinned the guide in 3 degrees of external rotation.The chamfer cutting guide was pinned into place. The anterior, posterior, and chamfer cuts were accomplished without difficulty followed by  the Sigma RP box  cutting guide and the box cut. We also removed posterior osteophytes from the posterior femoral condyles. At this  time, the knee was brought into full extension. We checked our  extension and flexion gaps and found them symmetric at 12.25mm.  The patella thickness measured at 21 mm. We set the cutting guide at 12 and removed the posterior 9 mm  of the patella sized for 35 button and drilled the lollipop. The knee  was then once again hyperflexed exposing the proximal tibia. We sized for a #4 tibial base plate, applied the smokestack and the conical reamer followed by the the Delta fin keel punch. We then hammered into place the Sigma RP trial femoral component, inserted a 12.5-mm trial bearing, trial patellar button, and took the knee through range of motion from 0-130 degrees. No thumb pressure was required for patellar  tracking. At this point, all trial components were removed, a double batch of DePuy HV cement with 1500 mg of Zinacef was mixed and applied to all bony metallic mating surfaces except for the posterior condyles of the femur itself. In order, we  hammered into place the tibial tray and removed excess cement, the femoral component and removed excess cement, a 12.5-mm Sigma RP bearing  was inserted, and the knee brought to full extension with compression.  The patellar button was clamped into place, and excess cement  removed. While the cement cured the wound was irrigated out with normal saline solution pulse lavage, and medium Hemovac drains were placed.. Ligament stability and patellar tracking were checked and found to be excellent. The tourniquet was then released and hemostasis was obtained with cautery. The parapatellar arthrotomy was closed with  #1 ethibond suture. The subcutaneous tissue with 0 and 2-0 undyed  Vicryl suture, and 4-0 Monocryl.. A dressing of Xeroform,  4 x 4, dressing sponges, Webril, and Ace wrap applied. Needle and sponge count were correct times 2.The patient  awakened, extubated, and taken to recovery room without difficulty. Vascular status was normal, pulses 2+ and symmetric.   [name] A 02/07/2012, 11:13 AM

## 2012-02-07 NOTE — Anesthesia Procedure Notes (Addendum)
Anesthesia Regional Block:  Femoral nerve block  Pre-Anesthetic Checklist: ,, timeout performed, Correct Patient, Correct Site, Correct Laterality, Correct Procedure, Correct Position, site marked, Risks and benefits discussed,  Surgical consent,  Pre-op evaluation,  At surgeon's request and post-op pain management  Laterality: Left  Prep: chloraprep       Needles:   Needle Type: Stimulator Needle - 80     Needle Length: 8cm  Needle Gauge: 22 and 22 G    Additional Needles:  Procedures: nerve stimulator Femoral nerve block  Nerve Stimulator or Paresthesia:  Response: 0.5 mA,   Additional Responses:   Narrative:  Start time: 02/07/2012 8:53 AM End time: 02/07/2012 9:00 AM Injection made incrementally with aspirations every 5 mL. Anesthesiologist: Dr Gypsy Balsam  Additional Notes: [phone] L FNB POP CHG prep, sterile tech #22 stim needle with stim down to .5ma Multiple neg asp Vernia Buff .5% w/epi 25cc+ decadron 4mg  infiltrated No compl Dr Gypsy Balsam   Procedure Name: Intubation Date/Time: 02/07/2012 9:41 AM Performed by: Gayla Medicus Pre-anesthesia Checklist: Patient identified, Timeout performed, Emergency Drugs available, Suction available and Patient being monitored Patient Re-evaluated:Patient Re-evaluated prior to inductionOxygen Delivery Method: Circle system utilized Preoxygenation: Pre-oxygenation with 100% oxygen Intubation Type: IV induction Ventilation: Mask ventilation without difficulty Laryngoscope Size: Mac and 3 Grade View: Grade II Tube type: Oral Tube size: 7.5 mm Number of attempts: 1 Airway Equipment and Method: Stylet Placement Confirmation: ETT inserted through vocal cords under direct vision,  positive ETCO2 and breath sounds checked- equal and bilateral Secured at: 23 cm Tube secured with: Tape Dental Injury: Teeth and Oropharynx as per pre-operative assessment

## 2012-02-07 NOTE — Transfer of Care (Signed)
Immediate Anesthesia Transfer of Care Note  Patient: Garrett Munoz  Procedure(s) Performed: Procedure(s) (LRB) with comments: TOTAL KNEE ARTHROPLASTY (Left)  Patient Location: PACU  Anesthesia Type: General  Level of Consciousness: awake, alert  and oriented  Airway & Oxygen Therapy: Patient Spontanous Breathing and Patient connected to nasal cannula oxygen  Post-op Assessment: Report given to PACU RN, Post -op Vital signs reviewed and stable and Patient moving all extremities X 4  Post vital signs: Reviewed and stable  Complications: No apparent anesthesia complications

## 2012-02-07 NOTE — Progress Notes (Signed)
Orthopedic Tech Progress Note Patient Details:  Garrett Munoz 30-Jun-1945 [id number]  CPM Left Knee CPM Left Knee: On Left Knee Flexion (Degrees): 60  Left Knee Extension (Degrees): 0  Additional Comments: TRAPEZE BAR   [name] 02/07/2012, 12:47 PM

## 2012-02-07 NOTE — Anesthesia Postprocedure Evaluation (Signed)
  Anesthesia Post-op Note  Patient: Garrett Munoz  Procedure(s) Performed: Procedure(s) (LRB) with comments: TOTAL KNEE ARTHROPLASTY (Left)  Patient Location: PACU  Anesthesia Type: General  Level of Consciousness: awake  Airway and Oxygen Therapy: Patient Spontanous Breathing  Post-op Pain: mild  Post-op Assessment: Post-op Vital signs reviewed  Post-op Vital Signs: Reviewed  Complications: No apparent anesthesia complications

## 2012-02-08 ENCOUNTER — Encounter (HOSPITAL_COMMUNITY): Payer: Self-pay | Admitting: Orthopedic Surgery

## 2012-02-08 LAB — CBC
HCT: 39.4 % (ref 39.0–52.0)
Hemoglobin: 13.6 g/dL (ref 13.0–17.0)
MCH: 31.6 pg (ref 26.0–34.0)
MCHC: 34.5 g/dL (ref 30.0–36.0)
MCV: 91.6 fL (ref 78.0–100.0)
RBC: 4.3 MIL/uL (ref 4.22–5.81)

## 2012-02-08 LAB — BASIC METABOLIC PANEL
BUN: 10 mg/dL (ref 6–23)
CO2: 28 mEq/L (ref 19–32)
Calcium: 8.8 mg/dL (ref 8.4–10.5)
Creatinine, Ser: 0.75 mg/dL (ref 0.50–1.35)
GFR calc non Af Amer: >90 mL/min (ref >90)
Glucose, Bld: 153 mg/dL — ABNORMAL HIGH (ref 70–99)

## 2012-02-08 LAB — GLUCOSE, CAPILLARY

## 2012-02-08 MED ORDER — DSS 100 MG PO CAPS: 100.0000 mg | ORAL_CAPSULE | Freq: Two times a day (BID) | ORAL | Status: DC

## 2012-02-08 MED ORDER — OXYCODONE HCL 5 MG PO TABS: 5.0000 mg | ORAL_TABLET | ORAL | Status: DC | PRN

## 2012-02-08 MED ORDER — BISACODYL 5 MG PO TBEC: DELAYED_RELEASE_TABLET | ORAL | Status: DC

## 2012-02-08 MED ORDER — ACETAMINOPHEN 325 MG PO TABS: 650.0000 mg | ORAL_TABLET | Freq: Four times a day (QID) | ORAL | Status: DC | PRN

## 2012-02-08 MED ORDER — DEXAMETHASONE 2 MG PO TABS: 4.0000 mg | ORAL_TABLET | Freq: Every day | ORAL | Status: DC

## 2012-02-08 MED ORDER — ENOXAPARIN SODIUM 30 MG/0.3ML ~~LOC~~ SOLN: 30.0000 mg | Freq: Two times a day (BID) | SUBCUTANEOUS | Status: DC

## 2012-02-08 NOTE — Care Management Note (Signed)
    Page 1 of 1   02/08/2012     1:33:44 PM   CARE MANAGEMENT NOTE 02/08/2012  Patient:  Garrett Munoz, Garrett Munoz   Account Number:  [id number]  Date Initiated:  02/08/2012  Documentation initiated by:  Anette Guarneri  Subjective/Objective Assessment:   left TKA  [facility] services pre-arranged by MD office w/[facility]  has DME     Action/Plan:   home with [facility]   Anticipated DC Date:  02/08/2012   Anticipated DC Plan:  HOME W HOME HEALTH SERVICES      DC Planning Services  CM consult      Choice offered to / List presented to:             Status of service:  Completed, signed off Medicare Important Message given?   (If response is "NO", the following Medicare IM given date fields will be blank) Date Medicare IM given:   Date Additional Medicare IM given:    Discharge Disposition:  HOME W HOME HEALTH SERVICES  Per UR Regulation:  Reviewed for med. necessity/level of care/duration of stay  If discussed at Long Length of Stay Meetings, dates discussed:    Comments:  02/08/12  13:32  Anette Guarneri RN/CM [facility] services pre-arranged by MD office with [facility] per Mr. Arita Miss he has all DME at home(3n1, RW, CPM)

## 2012-02-08 NOTE — Progress Notes (Signed)
[facility]  Subjective: No complaints of dizziness, SOB, CP  Objective: Vital signs in last 24 hours: Temp:  [97.4 F (36.3 C)-99 F (37.2 C)] 97.4 F (36.3 C) (10/22 0521) Pulse Rate:  [40-87] 57  (10/22 0521) Resp:  [9-19] 18  (10/22 0521) BP: (124-133)/(66-85) 124/66 mmHg (10/22 0521) SpO2:  [93 %-99 %] 98 % (10/22 0521) FiO2 (%):  [2 %] 2 % (10/21 0854) Last BM Date: 02/07/12  Intake/Output from previous day: 10/21 0701 - 10/22 0700 In: 3710 [P.O.:760; I.V.:2900; IV Piggyback:50] Out: 4950 [Urine:4725; Drains:200; Blood:25] Intake/Output this shift:    Medications Current Facility-Administered Medications  Medication Dose Route Frequency Provider Last Rate Last Dose  . 0.9 % NaCl with KCl 20 mEq/ L  infusion   Intravenous Continuous Kirstin J Shepperson, PA 100 mL/hr at 02/08/12 0310    . acetaminophen (OFIRMEV) IV 1,000 mg  1,000 mg Intravenous Once Bedelia Person, MD   1,000 mg at 02/07/12 0945  . acetaminophen (TYLENOL) tablet 650 mg  650 mg Oral Q6H PRN Kirstin J Shepperson, PA       Or  . acetaminophen (TYLENOL) suppository 650 mg  650 mg Rectal Q6H PRN Kirstin J Shepperson, PA      . allopurinol (ZYLOPRIM) tablet 300 mg  300 mg Oral Daily Kirstin J Shepperson, PA   300 mg at 02/07/12 1601  . bisacodyl (DULCOLAX) EC tablet 10 mg  10 mg Oral QAC supper Kirstin J Shepperson, PA   10 mg at 02/07/12 1718  . ceFAZolin (ANCEF) 3 g in dextrose 5 % 50 mL IVPB  3 g Intravenous 60 min Pre-Op Kirstin J Shepperson, PA   3 g at 02/07/12 0942  . ceFAZolin (ANCEF) IVPB 2 g/50 mL premix  2 g Intravenous Q6H Kirstin J Shepperson, PA   2 g at 02/07/12 2201  . celecoxib (CELEBREX) capsule 200 mg  200 mg Oral Q12H Kirstin J Shepperson, PA   200 mg at 02/07/12 1158  . dexamethasone (DECADRON) 10 MG/ML injection        10 mg at 02/07/12 1158  . dexamethasone (DECADRON) tablet 10 mg  10 mg Oral Q breakfast Kirstin J Shepperson, PA   10 mg at 02/08/12 0757   Or    . dexamethasone (DECADRON) injection 10 mg  10 mg Intravenous Q breakfast Kirstin J Shepperson, PA      . diazepam (VALIUM) tablet 2 mg  2 mg Oral Q8H PRN Pascal Lux, PA   2 mg at 02/08/12 0612  . docusate sodium (COLACE) capsule 100 mg  100 mg Oral BID Kirstin J Shepperson, PA   100 mg at 02/07/12 2203  . enoxaparin (LOVENOX) injection 30 mg  30 mg Subcutaneous Q12H Kirstin J Shepperson, PA   30 mg at 02/08/12 0757  . fentaNYL (SUBLIMAZE) injection 50-100 mcg  50-100 mcg Intravenous Once Bedelia Person, MD   100 mcg at 02/07/12 0902  . HYDROmorphone (DILAUDID) injection 0.5-1 mg  0.5-1 mg Intravenous Q2H PRN Pascal Lux, PA   1 mg at 02/08/12 0757  . magnesium oxide (MAG-OX) tablet 400 mg  400 mg Oral Daily Nilda Simmer, MD   400 mg at 02/07/12 1602  . menthol-cetylpyridinium (CEPACOL) lozenge 3 mg  1 lozenge Oral PRN Kirstin J Shepperson, PA       Or  . phenol (CHLORASEPTIC) mouth spray 1 spray  1 spray Mouth/Throat PRN Pascal Lux, PA   1 spray at 02/07/12 2159  .  metoCLOPramide (REGLAN) tablet 5-10 mg  5-10 mg Oral Q8H PRN Kirstin J Shepperson, PA       Or  . metoCLOPramide (REGLAN) injection 5-10 mg  5-10 mg Intravenous Q8H PRN Kirstin J Shepperson, PA      . ondansetron (ZOFRAN) tablet 4 mg  4 mg Oral Q6H PRN Kirstin J Shepperson, PA       Or  . ondansetron (ZOFRAN) injection 4 mg  4 mg Intravenous Q6H PRN Kirstin J Shepperson, PA      . oxyCODONE (Oxy IR/ROXICODONE) 5 MG immediate release tablet           . oxyCODONE (Oxy IR/ROXICODONE) immediate release tablet 5-10 mg  5-10 mg Oral Q3H PRN Pascal Lux, PA   10 mg at 02/08/12 0612  . temazepam (RESTORIL) capsule 30 mg  30 mg Oral QHS Kirstin J Shepperson, PA   30 mg at 02/07/12 2201  . DISCONTD: 0.9 % irrigation (POUR BTL)    PRN Nilda Simmer, MD   1,000 mL at 02/07/12 1016  . DISCONTD: bupivacaine-EPINEPHrine (MARCAINE W/ EPI) 0.25 % (with pres) injection    PRN Nilda Simmer, MD   30 mL at  02/07/12 1017  . DISCONTD: cefUROXime (ZINACEF) injection    PRN Nilda Simmer, MD   1.5 g at 02/07/12 1018  . DISCONTD: HYDROmorphone (DILAUDID) injection 0.25-0.5 mg  0.25-0.5 mg Intravenous Q5 min PRN Bedelia Person, MD      . DISCONTD: lactated ringers infusion   Intravenous Continuous Pascal Lux, PA 50 mL/hr at 02/07/12 0833 50 mL/hr at 02/07/12 0833  . DISCONTD: magnesium oxide (MAG-OX) tablet 400 mg  400 mg Oral Daily Kirstin J Shepperson, PA      . DISCONTD: midazolam (VERSED) injection 1-2 mg  1-2 mg Intravenous PRN Bedelia Person, MD   2 mg at 02/07/12 0901  . DISCONTD: oxyCODONE (Oxy IR/ROXICODONE) immediate release tablet 5 mg  5 mg Oral Once PRN Bedelia Person, MD      . DISCONTD: oxyCODONE (ROXICODONE) 5 MG/5ML solution 5 mg  5 mg Oral Once PRN Bedelia Person, MD      . DISCONTD: povidone-iodine (BETADINE) 7.5 % scrub   Topical Once Kirstin J Shepperson, PA      . DISCONTD: promethazine (PHENERGAN) injection 6.25-12.5 mg  6.25-12.5 mg Intravenous Q15 min PRN Bedelia Person, MD       Facility-Administered Medications Ordered in Other Encounters  Medication Dose Route Frequency Provider Last Rate Last Dose  . DISCONTD: artificial tears (LACRILUBE) ophthalmic ointment    PRN Lesle Chris. Hypes, RN   1 application at 02/07/12 0942  . DISCONTD: Bupivacaine-Epinephrine PF (MARCAINE W/ EPI (PF)) 0.5-1:200000 % injection    PRN Bedelia Person, MD   25 mL at 02/07/12 0858  . DISCONTD: dexamethasone (DECADRON) injection    PRN Bedelia Person, MD   4 mg at 02/07/12 0858  . DISCONTD: fentaNYL (SUBLIMAZE) injection    PRN Lesle Chris. Hypes, RN   50 mcg at 02/07/12 1015  . DISCONTD: glycopyrrolate (ROBINUL) injection    PRN Lesle Chris. Hypes, RN   0.8 mg at 02/07/12 1113  . DISCONTD: lactated ringers infusion    Continuous PRN Lesle Chris. Hypes, RN      . DISCONTD: lidocaine (cardiac) 100 mg/64ml (XYLOCAINE) 20 MG/ML injection 2%    PRN Lesle Chris. Hypes, RN   100 mg at 02/07/12 0938  . DISCONTD: neostigmine (PROSTIGMINE) injection    Intravenous PRN Lesle Chris. Hypes, RN  4 mg at 02/07/12 1113  . DISCONTD: ondansetron (ZOFRAN) injection    PRN Lesle Chris. Hypes, RN   4 mg at 02/07/12 1054  . DISCONTD: propofol (DIPRIVAN) 10 mg/mL bolus/IV push    PRN Lesle Chris. Hypes, RN   200 mg at 02/07/12 0938  . DISCONTD: rocuronium (ZEMURON) injection    PRN Lesle Chris. Hypes, RN   50 mg at 02/07/12 1610    PE: General appearance: alert, cooperative and no distress Lungs: clear to auscultation bilaterally Heart: irregular rhythm Pulses: 2+ and symmetric Neurologic: Grossly normal  Lab Results:   [facility] 02/08/12 0520  WBC 14.7*  HGB 13.6  HCT 39.4  PLT 181   BMET  Basename 02/08/12 0520  NA 137  K 4.4  CL 102  CO2 28  GLUCOSE 153*  BUN 10  CREATININE 0.75  CALCIUM 8.8    Assessment/Plan   Principal Problem:  *Left knee DJD Active Problems:  Gout  S/P total knee replacement  DDD (degenerative disc disease), lumbosacral  Radicular syndrome of left leg  Plan:  Maintaining irreg sinus rhythm.  2D echo pending.  BP and HR stable.  2D echo pending.  He is not on any rate control meds.  He has a long history of irreg heart beat.   If echo ok, 30 monitor would be a good idea.   LOS: 1 day    HAGER, BRYAN 02/08/2012 8:35 AM  I have seen & examined the patient this PM after Mr. Leron Croak.  I agree with his findings, exam & impression / recommendations.  I have reviewed his echo- poor quality images, but no obvious abnormalities.  Preserved EF & no obviously valvular lesions.  BP & HR stable - no further notable bradycardia.    Should be fine for d/c from Cardiology standpoint -- can see Dr. Tresa Endo or myself in OP to address need for monitor +/- Myoview ST.  Marykay Lex, M.D., M.S. [facility] [address] [place], [place]  [address]  [phone] Pager # [phone] 02/08/2012 3:03 PM

## 2012-02-08 NOTE — Progress Notes (Signed)
  Echocardiogram 2D Echocardiogram has been performed.  Garrett Munoz 02/08/2012, 11:31 AM

## 2012-02-08 NOTE — Progress Notes (Signed)
Physical Therapy Treatment Patient Details Name: Garrett Munoz MRN: [id number] DOB: 1946-01-31 Today's Date: 02/08/2012 Time: 1191-4782 PT Time Calculation (min): 28 min  PT Assessment / Plan / Recommendation Comments on Treatment Session  Pt continues to progress towards PT goals.  Pt able to increase ambulation and demonstrates ability to perform stair negotiation and car transfers safely.  Reviewed HEP with patient and discussed expectations for activity and mobility upon discharge.      Follow Up Recommendations  Home health PT     Does the patient have the potential to tolerate intense rehabilitation     Barriers to Discharge        Equipment Recommendations  None recommended by PT    Recommendations for Other Services    Frequency 7X/week   Plan Discharge plan remains appropriate    Precautions / Restrictions Precautions Precautions: Knee Precaution Booklet Issued: No Precaution Comments: discussed no pillow under knee Restrictions Weight Bearing Restrictions: Yes LLE Weight Bearing: Weight bearing as tolerated   Pertinent Vitals/Pain 4/10    Mobility  Bed Mobility Bed Mobility: Not assessed Transfers Transfers: Sit to Stand;Stand to Sit Sit to Stand: 5: Supervision;From chair/3-in-1;With armrests Stand to Sit: 5: Supervision;To chair/3-in-1;With armrests Ambulation/Gait Ambulation/Gait Assistance: 5: Supervision Ambulation Distance (Feet): 400 Feet Assistive device: Rolling walker Gait Pattern: Step-through pattern;Antalgic;Trunk flexed Gait velocity: decreased General Gait Details: Pt very steady with ambulation Stairs: Yes Stairs Assistance: 5: Supervision Stairs Assistance Details (indicate cue type and reason): VC's on technique and sequencing Stair Management Technique: Forwards;Two rails Number of Stairs: 6       PT Goals Acute Rehab PT Goals PT Goal Formulation: With patient Time For Goal Achievement: 02/13/12 Potential to Achieve Goals:  Good Pt will Stand: with modified independence PT Goal: Stand - Progress: Progressing toward goal Pt will Ambulate: >150 feet;with modified independence;with rolling walker PT Goal: Ambulate - Progress: Progressing toward goal Pt will Go Up / Down Stairs: 3-5 stairs;with rolling walker;with least restrictive assistive device PT Goal: Up/Down Stairs - Progress: Met Pt will Perform Home Exercise Program: Independently PT Goal: Perform Home Exercise Program - Progress: Progressing toward goal  Visit Information  Last PT Received On: 02/08/12 Assistance Needed: +1    Subjective Data  Subjective: I have been waiting to get my walking papers Patient Stated Goal: to go home today   Cognition  Overall Cognitive Status: Appears within functional limits for tasks assessed/performed Arousal/Alertness: Awake/alert Orientation Level: Oriented X4 / Intact;Appears intact for tasks assessed Behavior During Session: [facility] for tasks performed    Balance  Balance Balance Assessed: Yes High Level Balance High Level Balance Activites: Side stepping;Backward walking;Direction changes;Turns;Sudden stops;Head turns High Level Balance Comments: Pt steady with all aspects of balance  End of Session PT - End of Session Equipment Utilized During Treatment: Gait belt Activity Tolerance: Patient tolerated treatment well Patient left: in chair;with call bell/phone within reach;with family/visitor present Nurse Communication: Mobility status;Other (comment) (PT safe for dc) CPM Left Knee CPM Left Knee: On Left Knee Flexion (Degrees): 75  Left Knee Extension (Degrees): 0    GP     Fabio Asa 02/08/2012, 4:34 PM  Charlotte Crumb, PT DPT  [phone]

## 2012-02-08 NOTE — Evaluation (Signed)
Physical Therapy Evaluation Patient Details Name: Garrett Munoz MRN: [id number] DOB: 06/23/1945 Today's Date: 02/08/2012 Time: 1478-2956 PT Time Calculation (min): 46 min  PT Assessment / Plan / Recommendation Clinical Impression  Pt is pleasent and motivated 66 y.o. male s/p left TKA.  Pt itolerated ther-ex and ambulation well.  Pt has been instructed on curb negotiation and education of expections for activity upon discharge. Feel pt will be safe for d/c home after afternoon session. Rec home PT upon discharge.    PT Assessment  Patient needs continued PT services    Follow Up Recommendations  Home health PT    Does the patient have the potential to tolerate intense rehabilitation      Barriers to Discharge        Equipment Recommendations  None recommended by PT    Recommendations for Other Services     Frequency 7X/week    Precautions / Restrictions Precautions Precautions: Knee Precaution Booklet Issued: No Precaution Comments: discussed no pillow under knee Restrictions Weight Bearing Restrictions: Yes LLE Weight Bearing: Weight bearing as tolerated   Pertinent Vitals/Pain 4/10      Mobility  Bed Mobility Bed Mobility: Not assessed Transfers Transfers: Sit to Stand;Stand to Sit Sit to Stand: 5: Supervision;From chair/3-in-1;With armrests Stand to Sit: 5: Supervision;To chair/3-in-1;With armrests Ambulation/Gait Ambulation/Gait Assistance: 5: Supervision Ambulation Distance (Feet): 250 Feet Assistive device: Rolling walker Ambulation/Gait Assistance Details: VC's for upright posture; pt able to convert to step thru without VC's needed Gait Pattern: Step-through pattern;Antalgic;Trunk flexed Gait velocity: decreased General Gait Details: Pt very steady with ambulation Stairs: Yes Stairs Assistance: 5: Supervision Stairs Assistance Details (indicate cue type and reason): instruction provided for ascend/descend curb Stair Management Technique: No  rails;Forwards;With walker Number of Stairs: 1        Exercises Total Joint Exercises Ankle Circles/Pumps: AROM;20 reps;Both Quad Sets: AROM;10 reps;Left Straight Leg Raises: AROM;Left;5 reps Knee Flexion: AROM;Left;5 reps   PT Diagnosis: Difficulty walking;Generalized weakness;Abnormality of gait;Acute pain  PT Problem List: Decreased strength;Decreased range of motion;Decreased activity tolerance;Decreased mobility;Decreased safety awareness;Pain PT Treatment Interventions: DME instruction;Gait training;Stair training;Functional mobility training;Therapeutic activities;Therapeutic exercise;Patient/family education   PT Goals Acute Rehab PT Goals PT Goal Formulation: With patient Time For Goal Achievement: 02/13/12 Potential to Achieve Goals: Good Pt will Stand: with modified independence PT Goal: Stand - Progress: Goal set today Pt will Ambulate: >150 feet;with modified independence;with rolling walker PT Goal: Ambulate - Progress: Goal set today Pt will Go Up / Down Stairs: 3-5 stairs;with rolling walker;with least restrictive assistive device PT Goal: Up/Down Stairs - Progress: Goal set today Pt will Perform Home Exercise Program: Independently PT Goal: Perform Home Exercise Program - Progress: Goal set today  Visit Information  Last PT Received On: 02/08/12 Assistance Needed: +1    Subjective Data  Subjective: The pain isn't too bad right now Patient Stated Goal: to go home today   Prior Functioning  Home Living Lives With: Spouse Available Help at Discharge: Family Type of Home: House Home Access: Level entry Home Layout: One level Bathroom Shower/Tub: Health visitor: Handicapped height Bathroom Accessibility: Yes Home Adaptive Equipment: Walker - rolling;Bedside commode/3-in-1;Built-in shower seat Prior Function Level of Independence: Independent Able to Take Stairs?: Yes Driving: Yes Vocation: Retired Comments: very  active Musician: No difficulties Dominant Hand: Right    Cognition  Overall Cognitive Status: Appears within functional limits for tasks assessed/performed Arousal/Alertness: Awake/alert Orientation Level: Oriented X4 / Intact;Appears intact for tasks assessed Behavior During Session: [facility] for tasks  performed    Extremity/Trunk Assessment Right Upper Extremity Assessment RUE ROM/Strength/Tone: [facility] for tasks assessed Left Upper Extremity Assessment LUE ROM/Strength/Tone: WFL for tasks assessed Right Lower Extremity Assessment RLE ROM/Strength/Tone: [facility] for tasks assessed Left Lower Extremity Assessment LLE ROM/Strength/Tone: Deficits;Unable to fully assess;Due to pain;Due to precautions Trunk Assessment Trunk Assessment: Normal   Balance Balance Balance Assessed: Yes High Level Balance High Level Balance Activites: Side stepping;Backward walking;Direction changes;Turns;Sudden stops;Head turns High Level Balance Comments: Pt steady with all aspects of balance  End of Session PT - End of Session Equipment Utilized During Treatment: Gait belt Activity Tolerance: Patient tolerated treatment well Patient left: in chair;with call bell/phone within reach;with family/visitor present CPM Left Knee CPM Left Knee: Off  GP     Fabio Asa 02/08/2012, 11:11 AM  Charlotte Crumb, PT DPT  [phone]

## 2012-02-09 NOTE — Discharge Summary (Signed)
Patient ID: SHABAZZ WEIMAN MRN: [id number] DOB/AGE: 11-12-1945 66 y.o.  Admit date: 02/07/2012 Discharge date: 02/09/2012  Admission Diagnoses:  Principal Problem:  *Left knee DJD Active Problems:  Gout  S/P total knee replacement  DDD (degenerative disc disease), lumbosacral  Radicular syndrome of left leg   Discharge Diagnoses:  Same  Past Medical History  Diagnosis Date  . Gout   . Cataract     right eye  . Eye worm     right eye  . S/P total knee replacement     right  . DDD (degenerative disc disease), lumbosacral   . Radicular syndrome of left leg   . Left knee DJD   . Bronchitis     hx  . Pneumonia     hx  . Sleep apnea     can't use sleep study 10 yrs ago  . Dysrhythmia     irregular heatbeat  . 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     Surgeries: Procedure(s): TOTAL KNEE ARTHROPLASTY on 02/07/2012   Consultants: Treatment Team:  Lennette Bihari, MD  Discharged Condition: Improved  Hospital Course: STEPHAN STRANZ is an 66 y.o. male who was admitted 02/07/2012 for operative treatment ofLeft knee DJD. Patient has severe unremitting pain that affects sleep, daily activities, and work/hobbies. After pre-op clearance the patient was taken to the operating room on 02/07/2012 and underwent  Procedure(s): TOTAL KNEE ARTHROPLASTY.    Patient was given perioperative antibiotics: Anti-infectives     Start     Dose/Rate Route Frequency Ordered Stop   02/07/12 1600   ceFAZolin (ANCEF) IVPB 2 g/50 mL premix        2 g 100 mL/hr over 30 Minutes Intravenous Every 6 hours 02/07/12 1410 02/07/12 2231   02/07/12 1018   cefUROXime (ZINACEF) injection  Status:  Discontinued          As needed 02/07/12 1019 02/07/12 1140   02/06/12 1348   ceFAZolin (ANCEF) 3 g in dextrose 5 % 50 mL IVPB        3 g 160 mL/hr over 30 Minutes Intravenous 60 min pre-op 02/06/12 1348 02/07/12 0942           Patient was given sequential compression devices, early ambulation, and  chemoprophylaxis to prevent DVT. Post operatively the patient had some assymptomatic bradycardia.  [facility] was consulted as he was prior patient.  ECHO was done and showed no acute abnormality.  They will continue to follow this patient closely on an outpatient basis.  Patient benefited maximally from hospital stay and there were no othercomplications.    Recent vital signs: No data found.    Recent laboratory studies:  [facility] 02/08/12 0520  WBC 14.7*  HGB 13.6  HCT 39.4  PLT 181  NA 137  K 4.4  CL 102  CO2 28  BUN 10  CREATININE 0.75  GLUCOSE 153*  INR --  CALCIUM 8.8     Discharge Medications:     Medication List     As of 02/09/2012  3:19 PM    STOP taking these medications         HYDROcodone-acetaminophen 10-650 MG per tablet   Commonly known as: LORCET      TAKE these medications         acetaminophen 325 MG tablet   Commonly known as: TYLENOL   Take 2 tablets (650 mg total) by mouth every 6 (six) hours as needed (or Fever >/= 101).  allopurinol 300 MG tablet   Commonly known as: ZYLOPRIM   Take 300 mg by mouth daily.      bisacodyl 5 MG EC tablet   Commonly known as: DULCOLAX   2 tablets before dinner until bowels are regular      BLACK CHERRY CONCENTRATE PO   Take 1 tablet by mouth daily.      dexamethasone 2 MG tablet   Commonly known as: DECADRON   Take 2 tablets (4 mg total) by mouth daily with breakfast.      diazepam 2 MG tablet   Commonly known as: VALIUM   Take 2 mg by mouth daily.      DSS 100 MG Caps   Take 100 mg by mouth 2 (two) times daily.      enoxaparin 30 MG/0.3ML injection   Commonly known as: LOVENOX   Inject 0.3 mLs (30 mg total) into the skin every 12 (twelve) hours.      fish oil-omega-3 fatty acids 1000 MG capsule   Take 1 g by mouth daily.      magnesium oxide 400 MG tablet   Commonly known as: MAG-OX   Take 400 mg by mouth daily.      oxyCODONE 5 MG immediate release tablet    Commonly known as: Oxy IR/ROXICODONE   Take 1-2 tablets (5-10 mg total) by mouth every 4 (four) hours as needed.      temazepam 30 MG capsule   Commonly known as: RESTORIL   Take 30 mg by mouth at bedtime.        Diagnostic Studies: Dg Chest 2 View  02/02/2012  *RADIOLOGY REPORT*  Clinical Data: Preop knee arthroplasty  CHEST - 2 VIEW  Comparison: 11/24/2010  Findings: Low lung volumes.  Negative for infiltrate or effusion. Negative for heart failure.  Negative for mass lesion.  IMPRESSION: Low lung volumes.  No acute abnormality.   Original Report Authenticated By: Camelia Phenes, M.D.     Disposition: 06-Home-Health Care Svc      Discharge Orders    Future Orders Please Complete By Expires   Diet - low sodium heart healthy      Call MD / Call 911      Comments:   If you experience chest pain or shortness of breath, CALL 911 and be transported to the hospital emergency room.  If you develope a fever above 101 F, pus (white drainage) or increased drainage or redness at the wound, or calf pain, call your surgeon's office.   Constipation Prevention      Comments:   Drink plenty of fluids.  Prune juice may be helpful.  You may use a stool softener, such as Colace (over the counter) 100 mg twice a day.  Use MiraLax (over the counter) for constipation as needed.   Increase activity slowly as tolerated      Discharge instructions      Comments:   Total Knee Replacement Care After Refer to this sheet in the next few weeks. These discharge instructions provide you with general information on caring for yourself after you leave the hospital. Your caregiver may also give you specific instructions. Your treatment has been planned according to the most current medical practices available, but unavoidable complications sometimes occur. If you have any problems or questions after discharge, please call your caregiver. Regaining a near full range of motion of your knee within the first 3 to 6 weeks  after surgery is critical. HOME CARE INSTRUCTIONS  You may resume a normal diet and activities as directed.  Perform exercises as directed.  Place yellow foam block, yellow side up under heel at all times except when in CPM or when walking.  DO NOT modify, tear, cut, or change in any way. You will receive physical therapy daily  Take showers instead of baths until informed otherwise.  Change bandages (dressings)daily Do not take over-the-counter or prescription medicines for pain, discomfort, or fever. Eat a well-balanced diet.  Avoid lifting or driving until you are instructed otherwise.  Make an appointment to see your caregiver for stitches (suture) or staple removal as directed.  If you have been sent home with a continuous passive motion machine (CPM machine), 0-90 degrees 6 hrs a day   2 hrs a shift SEEK MEDICAL CARE IF: You have swelling of your calf or leg.  You develop shortness of breath or chest pain.  You have redness, swelling, or increasing pain in the wound.  There is pus or any unusual drainage coming from the surgical site.  You notice a bad smell coming from the surgical site or dressing.  The surgical site breaks open after sutures or staples have been removed.  There is persistent bleeding from the suture or staple line.  You are getting worse or are not improving.  You have any other questions or concerns.  SEEK IMMEDIATE MEDICAL CARE IF:  You have a fever.  You develop a rash.  You have difficulty breathing.  You develop any reaction or side effects to medicines given.  Your knee motion is decreasing rather than improving.  MAKE SURE YOU:  Understand these instructions.  Will watch your condition.  Will get help right away if you are not doing well or get worse.   CPM      Comments:   Continuous passive motion machine (CPM):      Use the CPM from 0 to 90 for 6 hours per day.       You may break it up into 2 or 3 sessions per day.      Use CPM for 2 weeks or  until you are told to stop.   TED hose      Comments:   Use stockings (TED hose) for 2 weeks on both leg(s).  You may remove them at night for sleeping.   Change dressing      Comments:   Change the dressing daily with sterile 4 x 4 inch gauze dressing and apply TED hose.  You may clean the incision with alcohol prior to redressing.   Do not put a pillow under the knee. Place it under the heel.      Scheduling Instructions:   Place yellow foam block, yellow side up under heel at all times except when in CPM or when walking.  DO NOT modify, tear, cut, or change in any way the yellow foam block.      Follow-up Information    Follow up with Nilda Simmer, MD. On 02/21/2012. (appt time 2 pm)    Contact information:   [address] [place] [address] [phone]       Follow up with Lennette Bihari, MD. (Our office will call you with the appt. date and time.)    Contact information:   [address] [place] [address] [phone]           Signed: Pascal Lux 02/09/2012, 3:19 PM

## 2012-02-11 ENCOUNTER — Encounter (HOSPITAL_COMMUNITY): Payer: Self-pay

## 2012-02-18 NOTE — Addendum Note (Signed)
Addendum  created 02/18/12 1001 by Bedelia Person, MD   Modules edited:Anesthesia Responsible Staff

## 2012-02-18 NOTE — Addendum Note (Signed)
Addendum  created 02/18/12 1000 by Bedelia Person, MD   Modules edited:Anesthesia Responsible Staff

## 2012-02-24 ENCOUNTER — Ambulatory Visit (HOSPITAL_COMMUNITY)
Admission: RE | Admit: 2012-02-24 | Discharge: 2012-02-24 | Disposition: A | Discharge status: Home / Self Care | Payer: 59 | Source: Ambulatory Visit | Attending: Orthopedic Surgery | Admitting: Orthopedic Surgery

## 2012-02-24 DIAGNOSIS — IMO0001 Reserved for inherently not codable concepts without codable children: Secondary | ICD-10-CM | POA: Insufficient documentation

## 2012-02-24 DIAGNOSIS — M6281 Muscle weakness (generalized): Secondary | ICD-10-CM | POA: Insufficient documentation

## 2012-02-24 DIAGNOSIS — M25562 Pain in left knee: Secondary | ICD-10-CM | POA: Insufficient documentation

## 2012-02-24 DIAGNOSIS — Z96659 Presence of unspecified artificial knee joint: Secondary | ICD-10-CM | POA: Insufficient documentation

## 2012-02-24 DIAGNOSIS — R29898 Other symptoms and signs involving the musculoskeletal system: Secondary | ICD-10-CM | POA: Insufficient documentation

## 2012-02-24 NOTE — Evaluation (Signed)
Physical Therapy Evaluation  Patient Details  Name: Garrett Munoz MRN: [id number] Date of Birth: May 21, 1945  Today's Date: 02/24/2012 Time: 1110-1146 PT Time Calculation (min): 36 min  Visit#: 1  of 8   Re-eval: 03/25/12 Assessment Diagnosis: L TKR Surgical Date: 02/07/12 Next MD Visit: 03/22/2012 Prior Therapy: HH  Authorization: [employer]/ [employer]   Past Medical History:  Past Medical History  Diagnosis Date  . Gout   . Cataract     right eye  . Eye worm     right eye  . S/P total knee replacement     right  . DDD (degenerative disc disease), lumbosacral   . Radicular syndrome of left leg   . Left knee DJD   . Bronchitis     hx  . Pneumonia     hx  . Sleep apnea     can't use sleep study 10 yrs ago  . Dysrhythmia     irregular heatbeat  . 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    Past Surgical History:  Past Surgical History  Procedure Date  . Retinal detachment surgery 03/2011    rt  . Eye examination under anesthesia w/ retinal cryotherapy and retinal laser 07/2011  . Cholecystectomy   . Knee arthroscopy     bilateral  . Total knee arthroplasty 2003    rt  . Eye surgery 02/2011    cat rt  . Total knee arthroplasty 02/07/2012    Procedure: TOTAL KNEE ARTHROPLASTY;  Surgeon: Nilda Simmer, MD;  Location[facility];  Service: Orthopedics;  Laterality: Left;    Subjective Symptoms/Limitations Symptoms: Mr. Garrett Munoz had a TKR on 1021/2013.  He went home with [facility] and is now being referred to OP therapy.  He comes to the department walking without an assistive device. He states he has been limiting his activity until he is told that he is able to increase his activity level.  He is eager to get back into the pool. Therapist explained that he can not get into the pool until his scars are completely healed. How long can you sit comfortably?: Able to sit as long as he wants. How long can you stand comfortably?: Able to stand as long as he wants. How long can you walk comfortably?:  Able to walk for a mile Pain Assessment Currently in Pain?: Yes Pain Score:   2 Pain Location: Knee Pain Orientation: Left Pain Type: Surgical pain    Prior Functioning  Prior Function Leisure: Hobbies-yes (Comment) Comments: gardening    Sensation/Coordination/Flexibility/Functional Tests Functional Tests Functional Tests: LEFS 47/64  Assessment LLE AROM (degrees) Left Knee Extension: 9  Left Knee Flexion: 105  LLE Strength Left Hip Flexion: 4/5 Left Hip Extension: 3+/5 Left Hip ABduction: 3/5 Left Knee Flexion: 3+/5 Left Knee Extension: 4/5 Left Ankle Dorsiflexion: 5/5  Exercise/Treatments Mobility/Balance    Ambulating without an assistive device.   Aerobic Stationary Bike: 1.0 x 5:00   Seated Long Arc Quad: Strengthening;Left;10 reps;Weights Long Arc Quad Weight: 4 lbs. Supine Quad Sets: Strengthening;10 reps Heel Slides: 5 reps Straight Leg Raises: 5 reps Sidelying Hip ABduction: 10 reps Prone  Hamstring Curl: 10 reps;Limitations Hamstring Curl Limitations: 4 # Hip Extension: Left;10 reps;Limitations Hip Extension Limitations: 4#    Physical Therapy Assessment and Plan    Begin heel raise; mini squats, rockerboard, standing knee flexion with 4# wt next treatment;  3rd treatment add lateral and forward step ups as well as SLS.  Goals Home Exercise Program Pt will Perform Home  Exercise Program: Independently PT Short Term Goals Time to Complete Short Term Goals: 2 weeks PT Short Term Goal 1: Pt to state pain is no greater than a 2 80% of the day PT Short Term Goal 2: ROM increased to 5-120 to allow squatting PT Long Term Goals Time to Complete Long Term Goals: 4 weeks PT Long Term Goal 1: Pain free PT Long Term Goal 2: able to ascend steps reciprocally Long Term Goal 3: Strength wnl to allow sit to stand and return from squatted positon with ease.  Problem List Patient Active Problem List  Diagnosis  . Gout  . Cataract  . Eye worm  . S/P  total knee replacement  . DDD (degenerative disc disease), lumbosacral  . Radicular syndrome of left leg  . Left knee DJD  . Pain in left knee  . Weakness of left leg       GP Functional Assessment Tool Used: LEFS Functional Limitation: Mobility: Walking and moving around Mobility: Walking and Moving Around Current Status [phone]): At least 20 percent but less than 40 percent impaired, limited or restricted Mobility: Walking and Moving Around Goal Status [phone]): At least 1 percent but less than 20 percent impaired, limited or restricted  Garrett Munoz 02/24/2012, 11:57 AM  Physician Documentation Your signature is required to indicate approval of the treatment plan as stated above.  Please sign and either send electronically or make a copy of this report for your files and return this physician signed original.   Please mark one 1.__approve of plan  2. ___approve of plan with the following conditions.   ______________________________                                                          _____________________ Physician Signature                                                                                                             Date

## 2012-02-29 ENCOUNTER — Ambulatory Visit (HOSPITAL_COMMUNITY)
Admission: RE | Admit: 2012-02-29 | Discharge: 2012-02-29 | Disposition: A | Discharge status: Home / Self Care | Payer: 59 | Source: Ambulatory Visit | Attending: Orthopedic Surgery | Admitting: Orthopedic Surgery

## 2012-02-29 NOTE — Progress Notes (Signed)
Physical Therapy Treatment Patient Details  Name: Garrett Munoz MRN: [id number] Date of Birth: 05/14/1945  Today's Date: 02/29/2012 Time: 0845-0929 PT Time Calculation (min): 44 min Visit#: 2  of 8   Re-eval: 03/24/12 Authorization: [employer] Medicare  Authorization Visit#: 2  of 8   Charges:  therex 42'  Subjective: Symptoms/Limitations Symptoms: Pt. states he is not hurting today.  States he is compliant with his HEP, wife is helping with his PROM and is walking a little over a mile. Pain Assessment Currently in Pain?: No/denies   Exercise/Treatments Aerobic Stationary Bike: 6' @ 2.0, seat 14 Standing Heel Raises: 10 reps;Limitations Heel Raises Limitations: toeraises 10 reps Knee Flexion: 10 reps;Limitations Knee Flexion Limitations: 4# weight Functional Squat: 10 reps Rocker Board: 2 minutes Seated Long Arc Quad: 15 reps;Weights Long Arc Quad Weight: 4 lbs. Supine Quad Sets: 10 reps;Limitations Quad Sets Limitations: 5" holds Heel Slides: 10 reps Straight Leg Raises: 10 reps Knee Extension: PROM;3 sets Knee Flexion: PROM;3 sets Sidelying Hip ABduction: 10 reps;Limitations Hip ABduction Limitations: 4# Prone  Hamstring Curl: 15 reps;Limitations Hamstring Curl Limitations: 4 # Hip Extension: 15 reps;Limitations Hip Extension Limitations: 4#     Physical Therapy Assessment and Plan PT Assessment and Plan Clinical Impression Statement: Pt. progressing well with strength and ROM; pt. motivated and completing all activities per HEP.  Added new activities per PT POC and able to complete without difficulty other than manual cues for form.  Tightness palpated in posterior knee and may benefit from MFR along with prone knee hang.  Pt. instructed to continue icing at home. PT Plan: Add lateral and fwd step ups, SLS and prone knee hang with MFR to posterior knee next visit.     Problem List Patient Active Problem List  Diagnosis  . Gout  . Cataract  . Eye worm  .  S/P total knee replacement  . DDD (degenerative disc disease), lumbosacral  . Radicular syndrome of left leg  . Left knee DJD  . Pain in left knee  . Weakness of left leg    PT - End of Session Activity Tolerance: Patient tolerated treatment well General Behavior During Session: [facility] for tasks performed Cognition: [facility] for tasks performed   Lurena Nida, PTA/CLT 02/29/2012, 9:55 AM

## 2012-03-02 ENCOUNTER — Ambulatory Visit (HOSPITAL_COMMUNITY)
Admission: RE | Admit: 2012-03-02 | Discharge: 2012-03-02 | Disposition: A | Discharge status: Home / Self Care | Payer: 59 | Source: Ambulatory Visit | Attending: Orthopedic Surgery | Admitting: Orthopedic Surgery

## 2012-03-02 NOTE — Progress Notes (Signed)
Physical Therapy Treatment Patient Details  Name: Garrett Munoz MRN: [id number] Date of Birth: 1946-02-05  Today's Date: 03/02/2012 Time: 4098-1191 PT Time Calculation (min): 55 min  Visit#: 3  of 8   Re-eval: 03/24/12 Charges: Therex x 40' ice a 10'  Authorization: [employer] Medicare  Authorization Visit#: 3  of 8    Subjective: Symptoms/Limitations Symptoms: Pt states that his knee feels better every day.  Pain Assessment Currently in Pain?: No/denies   Exercise/Treatments Aerobic Stationary Bike: 6' @ 2.0, seat 15 Standing Heel Raises: 15 reps Heel Raises Limitations: Toe raises 15 reps Knee Flexion: 10 reps;Limitations Knee Flexion Limitations: 5# weight Lateral Step Up: 10 reps;Hand Hold: 2;Step Height: 4";Left Forward Step Up: 10 reps;Hand Hold: 1;Step Height: 6";Left Functional Squat: 10 reps Rocker Board: 2 minutes SLS: L 5"max of 3 Seated Long Arc Quad: 15 reps;Weights Long Arc Quad Weight: 5 lbs. Supine Quad Sets: 10 reps;Limitations Quad Sets Limitations: 5" holds Heel Slides: 10 reps Knee Extension: PROM Knee Flexion: PROM Prone  Prone Knee Hang:  (Demonstrated for HEP(May begin MFR w/knee hang next session))   Modalities Modalities: Cryotherapy Cryotherapy Number Minutes Cryotherapy: 10 Minutes Cryotherapy Location: Knee (Left) Type of Cryotherapy: Ice pack  Physical Therapy Assessment and Plan PT Assessment and Plan Clinical Impression Statement: Pt appears to be progressing well. Pt requires multimodal cueing with quad sets to avoid glute comensation. Pt presents with edema in L knee and lower leg. Unable to complete retro massage due to pants, may begin next session. Ice applied to L knee to limit swelling and pain.  PT Plan: Continue to progress per PT POC. Begin MFR with prone knee hang next session. May begin retro massage to decrease edema if needed.    Goals    Problem List Patient Active Problem List  Diagnosis  . Gout  . Cataract    . Eye worm  . S/P total knee replacement  . DDD (degenerative disc disease), lumbosacral  . Radicular syndrome of left leg  . Left knee DJD  . Pain in left knee  . Weakness of left leg    PT - End of Session Activity Tolerance: Patient tolerated treatment well General Behavior During Session: [facility] for tasks performed Cognition: [facility] for tasks performed  GP    Antonieta Iba 03/02/2012, 9:38 AM

## 2012-03-06 ENCOUNTER — Ambulatory Visit (HOSPITAL_COMMUNITY): Payer: Medicare Other | Admitting: *Deleted

## 2012-03-07 ENCOUNTER — Ambulatory Visit (HOSPITAL_COMMUNITY)
Admission: RE | Admit: 2012-03-07 | Discharge: 2012-03-07 | Disposition: A | Discharge status: Home / Self Care | Payer: 59 | Source: Ambulatory Visit | Attending: Orthopedic Surgery | Admitting: Orthopedic Surgery

## 2012-03-07 ENCOUNTER — Ambulatory Visit (HOSPITAL_COMMUNITY): Payer: Medicare Other

## 2012-03-07 DIAGNOSIS — M25562 Pain in left knee: Secondary | ICD-10-CM

## 2012-03-07 DIAGNOSIS — R29898 Other symptoms and signs involving the musculoskeletal system: Secondary | ICD-10-CM

## 2012-03-07 NOTE — Progress Notes (Signed)
Physical Therapy Treatment Patient Details  Name: Garrett Munoz MRN: [id number] Date of Birth: 11/23/1945  Today's Date: 03/07/2012 Time: 0800-0850 PT Time Calculation (min): 50 min  Visit#: 4  of 8   Re-eval: 03/24/12    Authorization: [employer] Medicare     Authorization Visit#: 4  of 8    Subjective: Symptoms/Limitations Symptoms: I just have a little pain.  I'm walking every day I'm up to a mile and a half Pain Assessment Currently in Pain?: Yes Pain Score:   2 Pain Location: Knee Pain Orientation: Left   Exercise/Treatments   Aerobic Stationary Bike: 6' @ 2.0, seat 15   Standing Heel Raises: 15 reps Heel Raises Limitations: Toe raises 15 reps Knee Flexion: 10 reps;Limitations Knee Flexion Limitations: 5# weight Terminal Knee Extension: 15 reps;Theraband Lateral Step Up: Left;15 reps;Hand Hold: 2;Step Height: 4" Forward Step Up: Left;15 reps;Hand Hold: 1;Step Height: 6" Functional Squat: 10 reps Rocker Board: 2 minutes SLS: L 5"max of 3 Seated Long Arc Quad: 15 reps;Weights Long Arc Quad Weight: 5 lbs. Supine Quad Sets: 10 reps;Limitations Heel Slides: 10 reps Knee Extension: PROM Knee Flexion: PROM Sidelying Hip ABduction: 15 reps Hip ABduction Limitations: 5# Prone  Hamstring Curl: 15 reps Hamstring Curl Limitations: 5# Hip Extension: 15 reps Hip Extension Limitations: 5# Prone Knee Hang:  (Demonstrated for HEP(May begin MFR w/knee hang next session))    Physical Therapy Assessment and Plan PT Assessment and Plan Clinical Impression Statement: Pt needs cueing to keep in proper posture with exercises.  Pt unable to tolerate even mild PROM for extension; states he is fine with the way it is.  Pt to the beach today will return in one week. PT Plan: began standing knee extension.  Edema is improving no retro needed.      Goals Home Exercise Program PT Goal: Perform Home Exercise Program - Progress: Met PT Short Term Goals PT Short Term Goal 1 -  Progress: Met PT Short Term Goal 2 - Progress: Progressing toward goal PT Long Term Goals PT Long Term Goal 1 - Progress: Progressing toward goal PT Long Term Goal 2 - Progress: Progressing toward goal Long Term Goal 3 Progress: Progressing toward goal  Problem List Patient Active Problem List  Diagnosis  . Gout  . Cataract  . Eye worm  . S/P total knee replacement  . DDD (degenerative disc disease), lumbosacral  . Radicular syndrome of left leg  . Left knee DJD  . Pain in left knee  . Weakness of left leg    PT - End of Session Activity Tolerance: Patient tolerated treatment well General Behavior During Session: [facility] for tasks performed Cognition: [facility] for tasks performed  GP    RUSSELL,CINDY 03/07/2012, 8:55 AM

## 2012-03-09 ENCOUNTER — Ambulatory Visit (HOSPITAL_COMMUNITY): Payer: Medicare Other | Admitting: *Deleted

## 2012-03-14 ENCOUNTER — Ambulatory Visit (HOSPITAL_COMMUNITY)
Admission: RE | Admit: 2012-03-14 | Discharge: 2012-03-14 | Disposition: A | Discharge status: Home / Self Care | Payer: 59 | Source: Ambulatory Visit | Attending: Orthopedic Surgery | Admitting: Orthopedic Surgery

## 2012-03-14 NOTE — Progress Notes (Signed)
Physical Therapy Treatment Patient Details  Name: Garrett Munoz MRN: [id number] Date of Birth: 1946/02/14  Today's Date: 03/14/2012 Time: 0812-0852 PT Time Calculation (min): 40 min  Visit#: 5  of 8   Re-eval: 03/24/12 Charges:  Therex x 32'   Authorization: [employer] Medicare  Authorization Visit#: 5  of 8    Subjective: Symptoms/Limitations Symptoms: Pt reports HEP compliance. Pain Assessment Currently in Pain?: No/denies   Exercise/Treatments Aerobic Stationary Bike: 6' @ 2.0, seat 15 Standing Heel Raises: 15 reps Heel Raises Limitations: Toe raises 15 reps Knee Flexion: 15 reps Knee Flexion Limitations: 5# weight Lateral Step Up: Left;15 reps;Hand Hold: 2;Step Height: 4" Forward Step Up: Left;15 reps;Hand Hold: 1;Step Height: 6" Functional Squat: 15 reps Rocker Board: 2 minutes SLS: L 10"max of 3 Seated Long Arc Quad: 15 reps;Weights Long Arc Quad Weight: 5 lbs. Supine Short Arc Quad Sets: 10 reps;Limitations Short Arc Quad Sets Limitations: 5" holds Heel Slides: 10 reps Knee Extension: PROM Knee Flexion: PROM  Physical Therapy Assessment and Plan PT Assessment and Plan Clinical Impression Statement: Pt requires constant cueing to look up. Pt refuses PROM for extension and continues to states he is pleased with it the way it is. Pt educated on the importance of regaining full extension. Pt reports no increase in pain at end of session.  PT Plan: Continue to progress strength and ROM per PT POC.     Problem List Patient Active Problem List  Diagnosis  . Gout  . Cataract  . Eye worm  . S/P total knee replacement  . DDD (degenerative disc disease), lumbosacral  . Radicular syndrome of left leg  . Left knee DJD  . Pain in left knee  . Weakness of left leg    PT - End of Session Activity Tolerance: Patient tolerated treatment well General Behavior During Session: [facility] for tasks performed Cognition: [facility] for tasks performed  Seth Bake,  PTA 03/14/2012, 9:13 AM

## 2012-03-21 ENCOUNTER — Ambulatory Visit (HOSPITAL_COMMUNITY): Payer: Medicare Other | Admitting: Physical Therapy

## 2012-03-23 ENCOUNTER — Ambulatory Visit (HOSPITAL_COMMUNITY): Payer: Medicare Other | Admitting: Physical Therapy

## 2012-07-17 NOTE — Addendum Note (Signed)
Addendum created 07/17/12 1043 by Bedelia Person, MD   Modules edited: Anesthesia Responsible Staff

## 2012-08-11 NOTE — Addendum Note (Signed)
Addendum created 08/11/12 1111 by Micaela Stith, MD   Modules edited: Anesthesia Responsible Staff    

## 2014-01-15 ENCOUNTER — Encounter (INDEPENDENT_AMBULATORY_CARE_PROVIDER_SITE_OTHER): Payer: Self-pay | Admitting: *Deleted

## 2014-02-15 ENCOUNTER — Other Ambulatory Visit (INDEPENDENT_AMBULATORY_CARE_PROVIDER_SITE_OTHER): Payer: Self-pay | Admitting: *Deleted

## 2014-02-15 ENCOUNTER — Encounter (INDEPENDENT_AMBULATORY_CARE_PROVIDER_SITE_OTHER): Payer: Self-pay | Admitting: *Deleted

## 2014-02-15 DIAGNOSIS — Z1211 Encounter for screening for malignant neoplasm of colon: Secondary | ICD-10-CM

## 2014-03-22 ENCOUNTER — Telehealth (INDEPENDENT_AMBULATORY_CARE_PROVIDER_SITE_OTHER): Payer: Self-pay | Admitting: *Deleted

## 2014-03-22 DIAGNOSIS — Z1211 Encounter for screening for malignant neoplasm of colon: Secondary | ICD-10-CM

## 2014-03-22 NOTE — Telephone Encounter (Signed)
Patient needs trilyte 

## 2014-03-26 MED ORDER — PEG 3350-KCL-NA BICARB-NACL 420 G PO SOLR: 4000.0000 mL | Freq: Once | ORAL | Status: DC

## 2014-04-08 ENCOUNTER — Telehealth (INDEPENDENT_AMBULATORY_CARE_PROVIDER_SITE_OTHER): Payer: Self-pay | Admitting: *Deleted

## 2014-04-08 NOTE — Telephone Encounter (Signed)
Referring MD/PCP: Noah Delaine   Procedure: tcs  Reason/Indication:  screening  Has patient had this procedure before?  Yes, 2005 -- epic  If so, when, by whom and where?    Is there a family history of colon cancer?  no  Who?  What age when diagnosed?    Is patient diabetic?   no      Does patient have prosthetic heart valve?  no  Do you have a pacemaker?  no  Has patient ever had endocarditis? no  Has patient had joint replacement within last 12 months?  no  Does patient tend to be constipated or take laxatives? no  Is patient on Coumadin, Plavix and/or Aspirin? no  Medications: zolpidem 10 mg daily, allopurinol 300 mg daily, diazepam 2 mg bid  Allergies: nkda  Medication Adjustment:   Procedure date & time: 05/09/14 at 830

## 2014-04-15 NOTE — Telephone Encounter (Signed)
agree

## 2014-05-09 ENCOUNTER — Encounter (HOSPITAL_COMMUNITY): Admission: RE | Payer: Self-pay | Source: Ambulatory Visit

## 2014-05-09 ENCOUNTER — Ambulatory Visit (HOSPITAL_COMMUNITY): Admission: RE | Admit: 2014-05-09 | Payer: 59 | Source: Ambulatory Visit | Admitting: Internal Medicine

## 2014-05-09 SURGERY — COLONOSCOPY
Anesthesia: Moderate Sedation

## 2014-07-04 DIAGNOSIS — M545 Low back pain: Secondary | ICD-10-CM | POA: Diagnosis not present

## 2014-07-04 DIAGNOSIS — M109 Gout, unspecified: Secondary | ICD-10-CM | POA: Diagnosis not present

## 2014-07-04 DIAGNOSIS — M2550 Pain in unspecified joint: Secondary | ICD-10-CM | POA: Diagnosis not present

## 2014-07-04 DIAGNOSIS — M1990 Unspecified osteoarthritis, unspecified site: Secondary | ICD-10-CM | POA: Diagnosis not present

## 2014-07-04 DIAGNOSIS — M064 Inflammatory polyarthropathy: Secondary | ICD-10-CM | POA: Diagnosis not present

## 2014-07-25 DIAGNOSIS — M0609 Rheumatoid arthritis without rheumatoid factor, multiple sites: Secondary | ICD-10-CM | POA: Diagnosis not present

## 2014-07-25 DIAGNOSIS — M1990 Unspecified osteoarthritis, unspecified site: Secondary | ICD-10-CM | POA: Diagnosis not present

## 2014-07-25 DIAGNOSIS — M545 Low back pain: Secondary | ICD-10-CM | POA: Diagnosis not present

## 2014-07-25 DIAGNOSIS — M109 Gout, unspecified: Secondary | ICD-10-CM | POA: Diagnosis not present

## 2014-10-10 DIAGNOSIS — M779 Enthesopathy, unspecified: Secondary | ICD-10-CM | POA: Diagnosis not present

## 2014-10-10 DIAGNOSIS — M2041 Other hammer toe(s) (acquired), right foot: Secondary | ICD-10-CM | POA: Diagnosis not present

## 2014-10-22 ENCOUNTER — Ambulatory Visit: Payer: 59 | Admitting: Podiatry

## 2015-01-03 DIAGNOSIS — F17211 Nicotine dependence, cigarettes, in remission: Secondary | ICD-10-CM | POA: Diagnosis not present

## 2015-01-03 DIAGNOSIS — Z0000 Encounter for general adult medical examination without abnormal findings: Secondary | ICD-10-CM | POA: Diagnosis not present

## 2015-01-23 DIAGNOSIS — M779 Enthesopathy, unspecified: Secondary | ICD-10-CM | POA: Diagnosis not present

## 2015-01-23 DIAGNOSIS — M2041 Other hammer toe(s) (acquired), right foot: Secondary | ICD-10-CM | POA: Diagnosis not present

## 2015-01-27 ENCOUNTER — Telehealth: Payer: Self-pay | Admitting: *Deleted

## 2015-01-27 NOTE — Telephone Encounter (Signed)
PT  WAS SENT TO OFFICE   FROM  SURGERY CENTER  WAS  TO HAVE  FOOT SURGERY  UPON HAVING EKG DONE   SOME ABNORMALITIES  WERE NOTED .SURGERY WAS  CANCELLED  AND  PT  WAS INSTRUCTED  TO FOLLOW UP WITH  CARDIOLOGISTS   REVIEWED  PT'S  RECORDS   PT  HAS SEEN  DR KELLY IN PAST  AS  A CONSULTATION IN HOSPITAL ALSO   DISCUSSED  WITH DR Marlou Porch . DR Marlou Porch  REVIEWED  EKG AND  RHYTHM  STRIPS (A FLUTTER ?)  AND PT  NEEDS  PRE OP  CLEARANCE WITH  DR KELLY   APPT  MADE FOR   01-30-15 AT  8:00 AM .PT HAS NO COMPLAINTS  AT THIS TIME OR  S/S  .EKG AND  RHYTHM STRIPS GIVEN BACK  TO PT  ALONG  WITH  NOTE  FROM DR COSGROVE TO  HAVE  FOR UPCOMING APPT .Adonis Housekeeper

## 2015-01-30 ENCOUNTER — Ambulatory Visit (INDEPENDENT_AMBULATORY_CARE_PROVIDER_SITE_OTHER): Payer: 59 | Admitting: Cardiovascular Disease

## 2015-01-30 ENCOUNTER — Encounter: Payer: Self-pay | Admitting: Cardiovascular Disease

## 2015-01-30 VITALS — BP 124/90 | HR 97 | Ht 70.5 in | Wt 297.8 lb

## 2015-01-30 DIAGNOSIS — Z79899 Other long term (current) drug therapy: Secondary | ICD-10-CM

## 2015-01-30 DIAGNOSIS — Z8739 Personal history of other diseases of the musculoskeletal system and connective tissue: Secondary | ICD-10-CM

## 2015-01-30 DIAGNOSIS — Z01818 Encounter for other preprocedural examination: Secondary | ICD-10-CM | POA: Diagnosis not present

## 2015-01-30 DIAGNOSIS — I4819 Other persistent atrial fibrillation: Secondary | ICD-10-CM

## 2015-01-30 DIAGNOSIS — Z13220 Encounter for screening for lipoid disorders: Secondary | ICD-10-CM

## 2015-01-30 DIAGNOSIS — I481 Persistent atrial fibrillation: Secondary | ICD-10-CM

## 2015-01-30 DIAGNOSIS — Z8639 Personal history of other endocrine, nutritional and metabolic disease: Secondary | ICD-10-CM

## 2015-01-30 DIAGNOSIS — I4891 Unspecified atrial fibrillation: Secondary | ICD-10-CM

## 2015-01-30 DIAGNOSIS — G4733 Obstructive sleep apnea (adult) (pediatric): Secondary | ICD-10-CM

## 2015-01-30 MED ORDER — METOPROLOL SUCCINATE ER 50 MG PO TB24: ORAL_TABLET | ORAL | Status: DC

## 2015-01-30 MED ORDER — APIXABAN 5 MG PO TABS: 5.0000 mg | ORAL_TABLET | Freq: Two times a day (BID) | ORAL | Status: DC

## 2015-01-30 MED ORDER — APIXABAN 5 MG PO TABS
5.0000 mg | ORAL_TABLET | Freq: Two times a day (BID) | ORAL | Status: DC
Start: 2015-01-30 — End: 2015-04-02

## 2015-01-30 NOTE — Patient Instructions (Addendum)
Your physician has requested that you have an echocardiogram. Echocardiography is a painless test that uses sound waves to create images of your heart. It provides your doctor with information about the size and shape of your heart and how well your heart's chambers and valves are working. This procedure takes approximately one hour. There are no restrictions for this procedure.   Your physician has requested that you have a lexiscan myoview. For further information please visit [email or web address]. Please follow instruction sheet, as given.  Your physician recommends that you return for fasting lab work.  Your physician has recommended you make the following change in your medication: start new prescription for metoprolol SUCC 50 mg as directed on the bottle. This has been sent to your pharmacy. Start new prescription for Eliquis 5 mg twice a day.  Your physician has recommended that you have a sleep study. This test records several body functions during sleep, including: brain activity, eye movement, oxygen and carbon dioxide blood levels, heart rate and rhythm, breathing rate and rhythm, the flow of air through your mouth and nose, snoring, body muscle movements, and chest and belly movement.   Your physician recommends that you schedule a follow-up appointment in: 3-4 weeks.

## 2015-01-31 LAB — CBC
HCT: 47.2 % (ref 39.0–52.0)
HEMOGLOBIN: 16.6 g/dL (ref 13.0–17.0)
MCH: 32.7 pg (ref 26.0–34.0)
MCHC: 35.2 g/dL (ref 30.0–36.0)
MCV: 92.9 fL (ref 78.0–100.0)
MPV: 11.1 fL (ref 8.6–12.4)
Platelets: 196 10*3/uL (ref 150–400)
RBC: 5.08 MIL/uL (ref 4.22–5.81)
RDW: 14.2 % (ref 11.5–15.5)
WBC: 6.6 10*3/uL (ref 4.0–10.5)

## 2015-01-31 LAB — MAGNESIUM: Magnesium: 1.9 mg/dL (ref 1.5–2.5)

## 2015-01-31 LAB — COMPREHENSIVE METABOLIC PANEL
ALBUMIN: 4 g/dL (ref 3.6–5.1)
ALT: 18 U/L (ref 9–46)
AST: 18 U/L (ref 10–35)
Alkaline Phosphatase: 60 U/L (ref 40–115)
BUN: 15 mg/dL (ref 7–25)
CHLORIDE: 104 mmol/L (ref 98–110)
CO2: 29 mmol/L (ref 20–31)
CREATININE: 0.97 mg/dL (ref 0.70–1.18)
Calcium: 9.5 mg/dL (ref 8.6–10.3)
Glucose, Bld: 96 mg/dL (ref 65–99)
POTASSIUM: 4.8 mmol/L (ref 3.5–5.3)
SODIUM: 142 mmol/L (ref 135–146)
TOTAL PROTEIN: 6.7 g/dL (ref 6.1–8.1)
Total Bilirubin: 0.4 mg/dL (ref 0.2–1.2)

## 2015-01-31 LAB — LIPID PANEL
CHOLESTEROL: 143 mg/dL (ref 125–200)
HDL: 51 mg/dL (ref >=40)
LDL Cholesterol: 65 mg/dL (ref <130)
TRIGLYCERIDES: 136 mg/dL (ref <150)
Total CHOL/HDL Ratio: 2.8 Ratio (ref <=5.0)
VLDL: 27 mg/dL (ref <30)

## 2015-01-31 LAB — TSH: TSH: 0.834 u[IU]/mL (ref 0.350–4.500)

## 2015-02-01 ENCOUNTER — Encounter: Payer: Self-pay | Admitting: Cardiovascular Disease

## 2015-02-01 DIAGNOSIS — G4733 Obstructive sleep apnea (adult) (pediatric): Secondary | ICD-10-CM | POA: Insufficient documentation

## 2015-02-01 DIAGNOSIS — I4891 Unspecified atrial fibrillation: Secondary | ICD-10-CM | POA: Insufficient documentation

## 2015-02-01 DIAGNOSIS — Z8739 Personal history of other diseases of the musculoskeletal system and connective tissue: Secondary | ICD-10-CM | POA: Insufficient documentation

## 2015-02-01 NOTE — Progress Notes (Signed)
Patient ID: Garrett Munoz, male   DOB: [birth year], 69 y.o.   MRN: [id number]     HPI: Garrett Munoz is a 69 y.o. male who presents to the office today for a preoperative evaluation after elective foot surgery was canceled due to abnormal heart rhythm.  Garrett Munoz has a history of hypertension, and remotely had taken blood pressure medications for several years but none since the last 20 years.  He was seen a proximally 3 years ago.  He has a history of prior hypertension, obstructive sleep apnea, untreated due to previous intolerance to full face mask, as well as obesity.  He admits to having an intermittent irregular rhythm.  He recently was scheduled to undergo elective surgery on his right foot hammertoe by Dr. Fritzi Mandes.  Surgery was canceled due to concerns for possible Mobitz type II block with possible atrial flutter.  He presents now for cardiology evaluation.  He denies any chest pain.  He denies shortness of breath.  He is been unable to walk secondary to his toe abnormality.  He admits to daytime sleepiness and snoring.  He is not utilize CPAP therapy in years.  He is status post cataract surgery with lens implant.  He presents for evaluation.  Past Medical History  Diagnosis Date  . Gout   . Cataract     right eye  . Eye worm     right eye  . S/P total knee replacement     right  . DDD (degenerative disc disease), lumbosacral   . Radicular syndrome of left leg   . Left knee DJD   . Bronchitis     hx  . Pneumonia     hx  . Sleep apnea     can't use sleep study 10 yrs ago  . Dysrhythmia     irregular heatbeat  . 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 Retinal detachment surgery  03/2011    rt  . Eye examination under anesthesia w/ retinal cryotherapy and retinal laser  07/2011  . Cholecystectomy    . Knee arthroscopy      bilateral  . Total knee arthroplasty  2003    rt  . Eye surgery  02/2011    cat rt  . Total  knee arthroplasty  02/07/2012    Procedure: TOTAL KNEE ARTHROPLASTY;  Surgeon: [name], MD;  Location: [facility];  Service: Orthopedics;  Laterality: Left;    No Known Allergies  Current Outpatient Prescriptions  Medication Sig Dispense Refill  . acetaminophen (TYLENOL) 325 MG tablet Take 2 tablets (650 mg total) by mouth every 6 (six) hours as needed (or Fever >/= 101).    [name] allopurinol (ZYLOPRIM) 300 MG tablet Take 300 mg by mouth daily.    [name] ALPRAZolam (XANAX) 1 MG tablet Take 1 tablet by mouth at bedtime.  2  . fish oil-omega-3 fatty acids 1000 MG capsule Take 1 g by mouth daily.    . magnesium oxide (MAG-OX) 400 MG tablet Take 400 mg by mouth daily.    . Misc Natural Products (BLACK CHERRY CONCENTRATE PO) Take 1 tablet by mouth daily.    . temazepam (RESTORIL) 30 MG capsule Take 30 mg by mouth at bedtime.    [name] apixaban (ELIQUIS) 5 MG TABS tablet Take 1 tablet (5 mg total) by mouth 2 (two) times daily. 60 tablet 6  . apixaban (ELIQUIS) 5 MG TABS tablet Take 1  tablet (5 mg total) by mouth 2 (two) times daily. 28 tablet 0  . metoprolol succinate (TOPROL-XL) 50 MG 24 hr tablet Take 1/2 tablet daily for 4 days then increase to 1 tablet daily. If pulse drops less than 56 then hold. 30 tablet 6   No current facility-administered medications for this visit.    Social History   Social History  . Marital Status: Married    Spouse Name: N/A  . Number of Children: N/A  . Years of Education: N/A   Occupational History  . Not on file.   Social History Main Topics  . Smoking status: Former Smoker -- 2.00 packs/day for 15 years    Types: Cigarettes    Quit date: 02/02/1992  . Smokeless tobacco: Not on file     Comment: occ alcohol  . Alcohol Use: 0.0 oz/week    0 Standard drinks or equivalent per week     Comment: occasional  . Drug Use: No  . Sexual Activity: Not on file   Other Topics Concern  . 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  He is married.   Family History  Problem Relation Age of Onset  . Lung cancer Father   . Lung cancer Sister    Family history is notable that his father died at 74 with lung CA and his mother died at 79.  She had an irregular heart rhythm.  He has 2 sisters and one is deceased secondary to cancer and 1 brother.  ROS General: Negative; No fevers, chills, or night sweats HEENT: He has a right eye lens  implant after cataract surgery No changes in vision or hearing, sinus congestion, difficulty swallowing Pulmonary: Negative; No cough, wheezing, shortness of breath, hemoptysis Cardiovascular: See HPI: No chest pain, presyncope, syncope, or awareness of palpatations GI: Negative; No nausea, vomiting, diarrhea, or abdominal pain GU: Negative; No dysuria, hematuria, or difficulty voiding Musculoskeletal: Negative; no myalgias, joint pain, or weakness Hematologic: Negative; no easy bruising, bleeding Endocrine: Negative; no heat/cold intolerance; no diabetes, Neuro: Negative; no changes in balance, headaches Skin: Negative; No rashes or skin lesions Psychiatric: Negative; No behavioral problems, depression Sleep: Positive for untreated sleep apnea; positive for snoring and hypersomnolence. Other comprehensive 14 point system review is negative   Physical Exam BP 124/90 mmHg  Pulse 97  Ht 5' 10.5" (1.791 m)  Wt 297 lb 12.8 oz (135.081 kg)  BMI 42.11 kg/m2 Wt Readings from Last 3 Encounters:  01/30/15 297 lb 12.8 oz (135.081 kg)  02/02/12 283 lb 8 oz (128.595 kg)  01/26/12 285 lb (129.275 kg)   General: Alert, oriented, no distress.  Skin: normal turgor, no rashes, warm and dry HEENT: Normocephalic, atraumatic. Pupils equal round and reactive to light; sclera anicteric; extraocular muscles intact, No lid lag; Nose without nasal septal hypertrophy; Mouth/Parynx benign; Mallinpatti scale 3 Neck: No JVD, no carotid bruits; normal  carotid upstroke Lungs: clear to ausculatation and percussion bilaterally; no wheezing or rales, normal inspiratory and expiratory effort Chest wall: without tenderness to palpitation Heart: PMI not displaced, RRR, s1 s2 normal, 1/6 systolic murmur, No diastolic murmur, no rubs, gallops, thrills, or heaves Abdomen: soft, nontender; no hepatosplenomehaly, BS+; abdominal aorta nontender and not dilated by palpation. Back: no CVA tenderness Pulses: 2+  Musculoskeletal: full range of motion, normal strength, no joint deformities Extremities: Pulses 2+, no clubbing cyanosis or edema, Homan's  sign negative  Neurologic: grossly nonfocal; Cranial nerves grossly wnl Psychologic: Normal mood and affect   ECG (independently read by me): Atrial fibrillation at a approximately 90-100 bpm.  Small Q wave in lead 3.  Early transition.  LABS:  BMP Latest Ref Rng 01/30/2015 02/08/2012 02/02/2012  Glucose 65 - 99 mg/dL 96 153(H) 104(H)  BUN 7 - 25 mg/dL _0 Creatinine 0.70 - 1.18 mg/dL 0.97 0.75 0.85  Sodium 135 - 146 mmol/L 142 137 140  Potassium 3.5 - 5.3 mmol/L 4.8 4.4 4.6  Chloride 98 - 110 mmol/L 104 102 104  CO2 20 - 31 mmol/L _1 Calcium 8.6 - 10.3 mg/dL 9.5 8.8 9.7    Hepatic Function Latest Ref Rng 01/30/2015 02/02/2012  Total Protein 6.1 - 8.1 g/dL 6.7 7.9  Albumin 3.6 - 5.1 g/dL 4.0 4.1  AST 10 - 35 U/L 18 21  ALT 9 - 46 U/L 18 20  Alk Phosphatase 40 - 115 U/L 60 76  Total Bilirubin 0.2 - 1.2 mg/dL 0.4 0.4    CBC Latest Ref Rng 01/30/2015 02/08/2012 02/04/2012  WBC 4.0 - 10.5 K/uL 6.6 14.7(H) 8.8  Hemoglobin 13.0 - 17.0 g/dL 16.6 13.6 15.9  Hematocrit 39.0 - 52.0 % 47.2 39.4 45.2  Platelets 150 - 400 K/uL 196 181 186   Lab Results  Component Value Date   MCV 92.9 01/30/2015   MCV 91.6 02/08/2012   MCV 91.5 02/04/2012    Lab Results  Component Value Date   TSH 0.834 01/30/2015    BNP No results found for: BNP  ProBNP No results found for:  PROBNP   Lipid Panel     Component Value Date/Time   CHOL 143 01/30/2015 0920   TRIG 136 01/30/2015 0920   HDL 51 01/30/2015 0920   CHOLHDL 2.8 01/30/2015 0920   VLDL 27 01/30/2015 0920   LDLCALC 65 01/30/2015 0920     RADIOLOGY: No results found.    ASSESSMENT AND PLAN: Garrett Munoz is a 69 year old gentleman who has a history of morbid obesity with a body mass index of 42.11, a remote history of hypertension and a history of obstructive sleep apnea, untreated for many years.  His ECG today demonstrates atrial fibrillation of questionable duration.  His recent elective surgery was canceled due to his abnormal rhythm.  He has not been very active due to his toe abnormality.  Presently, I am recommending complete set of blood work be obtained.  I am scheduling him for an echo Doppler study to evaluate both systolic and diastolic function as well as chamber dimensions.  With a small inferior Q wave in lead 3 and early transition I am also scheduling him for a Lexiscan sestamibi study to evaluate for potential CAD, scar/ischemia.  I am initiating Toprol XL 25 mg for the first 4 days and he will then increase this to 50 mg daily.  With his atrial fibrillation I discussed the importance of anticoagulation in reducing potential thromboembolic risk and as result am initiating anticoagulation therapy with eliquis 5 mg twice a day.  He has untreated sleep apnea which may be contributory in his development of atrial fibrillation.  I am scheduling him for a split-night sleep study , which will be done at [facility].  He has a history of gout, which is stable with his current dose of allupurinol.   I am presently deferring his elective surgery on his foot and I will see him in  3-4 weeks for follow-up evaluation and further recommendations will be made at that time.  Time spent: 45 minutes   [name], MD, [facility]  02/01/2015 10:37 PM

## 2015-02-02 ENCOUNTER — Encounter (HOSPITAL_BASED_OUTPATIENT_CLINIC_OR_DEPARTMENT_OTHER): Payer: Medicare Other

## 2015-02-04 ENCOUNTER — Encounter: Payer: Self-pay | Admitting: *Deleted

## 2015-02-06 ENCOUNTER — Telehealth (HOSPITAL_COMMUNITY): Payer: Self-pay

## 2015-02-06 NOTE — Telephone Encounter (Signed)
Encounter complete. 

## 2015-02-07 ENCOUNTER — Telehealth (HOSPITAL_COMMUNITY): Payer: Self-pay

## 2015-02-07 NOTE — Telephone Encounter (Signed)
Encounter complete. 

## 2015-02-11 ENCOUNTER — Ambulatory Visit (HOSPITAL_COMMUNITY)
Admission: RE | Admit: 2015-02-11 | Discharge: 2015-02-11 | Disposition: A | Discharge status: Home / Self Care | Payer: 59 | Source: Ambulatory Visit | Attending: Cardiology | Admitting: Cardiology

## 2015-02-11 DIAGNOSIS — Z87891 Personal history of nicotine dependence: Secondary | ICD-10-CM | POA: Insufficient documentation

## 2015-02-11 DIAGNOSIS — E669 Obesity, unspecified: Secondary | ICD-10-CM | POA: Diagnosis not present

## 2015-02-11 DIAGNOSIS — I4891 Unspecified atrial fibrillation: Secondary | ICD-10-CM | POA: Diagnosis not present

## 2015-02-11 DIAGNOSIS — Z6841 Body Mass Index (BMI) 40.0 and over, adult: Secondary | ICD-10-CM | POA: Diagnosis not present

## 2015-02-11 DIAGNOSIS — G4733 Obstructive sleep apnea (adult) (pediatric): Secondary | ICD-10-CM | POA: Diagnosis not present

## 2015-02-11 MED ORDER — REGADENOSON 0.4 MG/5ML IV SOLN
0.4000 mg | Freq: Once | INTRAVENOUS | Status: AC
  Administered 2015-02-11: 0.4 mg via INTRAVENOUS

## 2015-02-11 MED ORDER — TECHNETIUM TC 99M SESTAMIBI GENERIC - CARDIOLITE
30.8000 | Freq: Once | INTRAVENOUS | Status: AC | PRN
  Administered 2015-02-11: 30.8 via INTRAVENOUS

## 2015-02-12 ENCOUNTER — Encounter: Payer: Self-pay | Admitting: Cardiovascular Disease

## 2015-02-12 ENCOUNTER — Ambulatory Visit (HOSPITAL_COMMUNITY)
Admission: RE | Admit: 2015-02-12 | Discharge: 2015-02-12 | Disposition: A | Discharge status: Home / Self Care | Payer: 59 | Source: Ambulatory Visit | Attending: Cardiology | Admitting: Cardiology

## 2015-02-12 LAB — MYOCARDIAL PERFUSION IMAGING
CHL CUP NUCLEAR SDS: 1
CHL CUP RESTING HR STRESS: 86 {beats}/min
CSEPPHR: 102 {beats}/min
SRS: 1
SSS: 2
TID: 0.9

## 2015-02-12 MED ORDER — TECHNETIUM TC 99M SESTAMIBI GENERIC - CARDIOLITE
31.0000 | Freq: Once | INTRAVENOUS | Status: AC | PRN
  Administered 2015-02-12: 31 via INTRAVENOUS

## 2015-02-14 ENCOUNTER — Encounter: Payer: Self-pay | Admitting: *Deleted

## 2015-02-18 ENCOUNTER — Ambulatory Visit (HOSPITAL_COMMUNITY): Payer: 59 | Attending: Cardiovascular Disease

## 2015-02-18 ENCOUNTER — Other Ambulatory Visit: Payer: Self-pay

## 2015-02-18 DIAGNOSIS — I10 Essential (primary) hypertension: Secondary | ICD-10-CM | POA: Diagnosis not present

## 2015-02-18 DIAGNOSIS — I517 Cardiomegaly: Secondary | ICD-10-CM | POA: Insufficient documentation

## 2015-02-18 DIAGNOSIS — I4891 Unspecified atrial fibrillation: Secondary | ICD-10-CM | POA: Insufficient documentation

## 2015-02-18 DIAGNOSIS — Z6841 Body Mass Index (BMI) 40.0 and over, adult: Secondary | ICD-10-CM | POA: Insufficient documentation

## 2015-02-21 ENCOUNTER — Encounter: Payer: Self-pay | Admitting: Cardiovascular Disease

## 2015-02-21 ENCOUNTER — Ambulatory Visit
Admission: RE | Admit: 2015-02-21 | Discharge: 2015-02-21 | Disposition: A | Discharge status: Home / Self Care | Payer: 59 | Source: Ambulatory Visit | Attending: Cardiovascular Disease | Admitting: Cardiovascular Disease

## 2015-02-21 ENCOUNTER — Other Ambulatory Visit: Payer: Self-pay | Admitting: *Deleted

## 2015-02-21 ENCOUNTER — Ambulatory Visit (INDEPENDENT_AMBULATORY_CARE_PROVIDER_SITE_OTHER): Payer: 59 | Admitting: Cardiovascular Disease

## 2015-02-21 VITALS — BP 140/88 | HR 99 | Ht 71.0 in | Wt 298.3 lb

## 2015-02-21 DIAGNOSIS — G4733 Obstructive sleep apnea (adult) (pediatric): Secondary | ICD-10-CM

## 2015-02-21 DIAGNOSIS — D689 Coagulation defect, unspecified: Secondary | ICD-10-CM | POA: Diagnosis not present

## 2015-02-21 DIAGNOSIS — Z01810 Encounter for preprocedural cardiovascular examination: Secondary | ICD-10-CM | POA: Diagnosis not present

## 2015-02-21 DIAGNOSIS — I480 Paroxysmal atrial fibrillation: Secondary | ICD-10-CM | POA: Diagnosis not present

## 2015-02-21 DIAGNOSIS — Z7901 Long term (current) use of anticoagulants: Secondary | ICD-10-CM

## 2015-02-21 MED ORDER — METOPROLOL SUCCINATE ER 50 MG PO TB24: ORAL_TABLET | ORAL | Status: DC

## 2015-02-21 NOTE — Progress Notes (Signed)
Patient ID: Jerrol F Relph, male   DOB: 10/02/1945, 69 y.o.   MRN: [id number]     HPI: Durante F Goble is a 69 y.o. male who presents to the office today for follow-up evaluation of recently diagnosed atrial fibrillation.  Mr. Codrington has a history of hypertension, and remotely had taken blood pressure medications for several years but none since the last 20 years. He has a history of prior hypertension, obstructive sleep apnea, untreated due to previous intolerance to full face mask, as well as obesity.  He admits to having an intermittent irregular rhythm.  He recently was scheduled to undergo elective surgery on his right foot hammertoe by Dr. Ajlouny.  Surgery was canceled due to concerns for possible Mobitz type II block with possible atrial flutter.  He was seen by me for preoperative evaluation on 71/13/2016.  At that time, his ECG demonstrated atrial fibrillation with ventricular rate at approximately 100 bpm.  He was noted have small Q wave in lead 3.  He was started on Toprol 25 mg for 4 days and this was titrated up to 50 mg.  He also was started on eloquence 5 mg twice a day for anticoagulation.  A 2-D echo Doppler study done on February 18 2015 revealed an ejection fraction of 55-60%.  There was moderate left ventricular hypertrophy.  The left atrium was moderately dilated.  A nuclear perfusion study done on 02/13/2015 was low risk without ischemia with only a mild apical defect, probably attenuation. He denies any chest pain.  He denies shortness of breath.  He is been unable to walk secondary to his toe abnormality.  He admits to daytime sleepiness and snoring.  He has not utilize CPAP therapy in years.  He is status post cataract surgery with lens implant.    He denies PND, orthopnea.  He is unaware of his heart rhythm being irregular.  He denies bleeding as tolerated eloquence.  He presents for follow-up evaluation.  Past Medical History  Diagnosis Date  . Gout   . Cataract     right  eye  . Eye worm     right eye  . S/P total knee replacement     right  . DDD (degenerative disc disease), lumbosacral   . Radicular syndrome of left leg   . Left knee DJD   . Bronchitis     hx  . Pneumonia     hx  . Sleep apnea     can't use sleep study 10 yrs ago  . Dysrhythmia     irregular heatbeat  . 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 Retinal detachment surgery  03/2011    rt  . Eye examination under anesthesia w/ retinal cryotherapy and retinal laser  07/2011  . Cholecystectomy    . Knee arthroscopy      bilateral  . Total knee arthroplasty  2003    rt  . Eye surgery  02/2011    cat rt  . Total knee arthroplasty  02/07/2012    Procedure: TOTAL KNEE ARTHROPLASTY;  Surgeon: Robert A Wainer, MD;  Location: [facility];  Service: Orthopedics;  Laterality: Left;    No Known Allergies  Current Outpatient Prescriptions  Medication Sig Dispense Refill  . acetaminophen (TYLENOL) 325 MG tablet Take 2 tablets (650 mg total) by mouth every 6 (six) hours as needed (or Fever >/= 101).    . allopurinol (ZYLOPRIM) 300 MG tablet   Take 300 mg by mouth daily.    [name] ALPRAZolam (XANAX) 1 MG tablet Take 1 tablet by mouth at bedtime.  2  . apixaban (ELIQUIS) 5 MG TABS tablet Take 1 tablet (5 mg total) by mouth 2 (two) times daily. 28 tablet 0  . fish oil-omega-3 fatty acids 1000 MG capsule Take 1 g by mouth daily.    . magnesium oxide (MAG-OX) 400 MG tablet Take 400 mg by mouth daily.    . metoprolol succinate (TOPROL-XL) 50 MG 24 hr tablet Take 1.5 tablets once daily. If pulse drops less than 56 then hold. 45 tablet 6  . Misc Natural Products (BLACK CHERRY CONCENTRATE PO) Take 1 tablet by mouth daily.    . temazepam (RESTORIL) 30 MG capsule Take 30 mg by mouth at bedtime.     No current facility-administered medications for this visit.    Social History   Social History  . Marital Status: Married    Spouse Name: N/A  . Number of  Children: N/A  . Years of Education: N/A   Occupational History  . Not on file.   Social History Main Topics  . Smoking status: Former Smoker -- 2.00 packs/day for 15 years    Types: Cigarettes    Quit date: 02/02/1992  . Smokeless tobacco: Not on file     Comment: occ alcohol  . Alcohol Use: 0.0 oz/week    0 Standard drinks or equivalent per week     Comment: occasional  . Drug Use: No  . Sexual Activity: Not on file   Other Topics Concern  . 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  He is married.   Family History  Problem Relation Age of Onset  . Lung cancer Father   . Lung cancer Sister    Family history is notable that his father died at 26 with lung CA and his mother died at 86.  She had an irregular heart rhythm.  He has 2 sisters and one is deceased secondary to cancer and 1 brother.  ROS General: Negative; No fevers, chills, or night sweats HEENT: He has a right eye lens  implant after cataract surgery No changes in vision or hearing, sinus congestion, difficulty swallowing Pulmonary: Negative; No cough, wheezing, shortness of breath, hemoptysis Cardiovascular: See HPI: No chest pain, presyncope, syncope, or awareness of palpatations GI: Negative; No nausea, vomiting, diarrhea, or abdominal pain GU: Negative; No dysuria, hematuria, or difficulty voiding Musculoskeletal: Negative; no myalgias, joint pain, or weakness Hematologic: Negative; no easy bruising, bleeding Endocrine: Negative; no heat/cold intolerance; no diabetes, Neuro: Negative; no changes in balance, headaches Skin: Negative; No rashes or skin lesions Psychiatric: Negative; No behavioral problems, depression Sleep: Positive for untreated sleep apnea; positive for snoring and hypersomnolence. Other comprehensive 14 point system review is negative   Physical Exam BP 140/88 mmHg  Pulse 99  Ht 5' 11" (1.803 m)   Wt 298 lb 4.8 oz (135.308 kg)  BMI 41.62 kg/m2  Repeat blood pressure 130/86.  Wt Readings from Last 3 Encounters:  02/21/15 298 lb 4.8 oz (135.308 kg)  02/11/15 297 lb (134.718 kg)  01/30/15 297 lb 12.8 oz (135.081 kg)   General: Alert, oriented, no distress.  Skin: normal turgor, no rashes, warm and dry HEENT: Normocephalic, atraumatic. Pupils equal round and reactive to light; sclera anicteric; extraocular muscles intact, No lid lag; Nose without nasal  septal hypertrophy; Mouth/Parynx benign; Mallinpatti scale 3 Neck: No JVD, no carotid bruits; normal carotid upstroke Lungs: clear to ausculatation and percussion bilaterally; no wheezing or rales, normal inspiratory and expiratory effort Chest wall: without tenderness to palpitation Heart: Irregularly irregular rhythm with a rate at approximately mid 90s, s1 s2 normal, 1/6 systolic murmur, No diastolic murmur, no rubs, gallops, thrills, or heaves Abdomen: Moderate central adiposity soft, nontender; no hepatosplenomehaly, BS+; abdominal aorta nontender and not dilated by palpation. Back: no CVA tenderness Pulses: 2+  Musculoskeletal: full range of motion, normal strength, no joint deformities Extremities: Pulses 2+, no clubbing cyanosis or edema, Homan's sign negative  Neurologic: grossly nonfocal; Cranial nerves grossly wnl Psychologic: Normal mood and affect  ECG (independently read by me): Atrial fibrillation at 99 bpm.   01/30/2015 ECG (independently read by me): Atrial fibrillation at a approximately 90-100 bpm.  Small Q wave in lead 3.  Early transition.  LABS:  BMP Latest Ref Rng 01/30/2015 02/08/2012 02/02/2012  Glucose 65 - 99 mg/dL 96 153(H) 104(H)  BUN 7 - 25 mg/dL _0 Creatinine 0.70 - 1.18 mg/dL 0.97 0.75 0.85  Sodium 135 - 146 mmol/L 142 137 140  Potassium 3.5 - 5.3 mmol/L 4.8 4.4 4.6  Chloride 98 - 110 mmol/L 104 102 104  CO2 20 - 31 mmol/L _1 Calcium 8.6 - 10.3 mg/dL 9.5 8.8 9.7    Hepatic  Function Latest Ref Rng 01/30/2015 02/02/2012  Total Protein 6.1 - 8.1 g/dL 6.7 7.9  Albumin 3.6 - 5.1 g/dL 4.0 4.1  AST 10 - 35 U/L 18 21  ALT 9 - 46 U/L 18 20  Alk Phosphatase 40 - 115 U/L 60 76  Total Bilirubin 0.2 - 1.2 mg/dL 0.4 0.4    CBC Latest Ref Rng 01/30/2015 02/08/2012 02/04/2012  WBC 4.0 - 10.5 K/uL 6.6 14.7(H) 8.8  Hemoglobin 13.0 - 17.0 g/dL 16.6 13.6 15.9  Hematocrit 39.0 - 52.0 % 47.2 39.4 45.2  Platelets 150 - 400 K/uL 196 181 186   Lab Results  Component Value Date   MCV 92.9 01/30/2015   MCV 91.6 02/08/2012   MCV 91.5 02/04/2012    Lab Results  Component Value Date   TSH 0.834 01/30/2015    BNP No results found for: BNP  ProBNP No results found for: PROBNP   Lipid Panel     Component Value Date/Time   CHOL 143 01/30/2015 0920   TRIG 136 01/30/2015 0920   HDL 51 01/30/2015 0920   CHOLHDL 2.8 01/30/2015 0920   VLDL 27 01/30/2015 0920   LDLCALC 65 01/30/2015 0920     RADIOLOGY: No results found.    ASSESSMENT AND PLAN: Mr. Natasha Paulson is a 69 year old gentleman who has a history of morbid obesity with a body mass index of 41.62, a remote history of hypertension and a history of obstructive sleep apnea, untreated for many years.  When I saw him 3 weeks ago, his ECG revealed atrial fibrillation of questionable duration.  His elective surgery was canceled due to his abnormal rhythm.  He has not been very active due to his toe abnormality.  I reviewed his recent laboratory with him in detail and also reviewed his echo Doppler findings as well as his nuclear perfusion study.  His ventricular rate is still in the upper 90s.  I will now further titrate his Toprol to 75 mg daily.  I discussed potential outpatient DC cardioversion.  In several weeks he will undergo attempt at  restoration of sinus rhythm on his increased beta blocker regimen after he has been on eliquis for ~ 4-5 weeks.  I discussed the risk, benefits of the cardioversion procedure.   Repeat blood work will be obtained prior to his scheduled date.  Time spent: 25 minutes  Dorianna Mckiver A. Palmina Clodfelter, MD, FACC  02/21/2015 3:12 PM    

## 2015-02-21 NOTE — Patient Instructions (Signed)
Your physician has recommended that you have a Cardioversion (DCCV). Electrical Cardioversion uses a jolt of electricity to your heart either through paddles or wired patches attached to your chest. This is a controlled, usually prescheduled, procedure. Defibrillation is done under light anesthesia in the hospital, and you usually go home the day of the procedure. This is done to get your heart back into a normal rhythm. You are not awake for the procedure. Please see the instruction sheet given to you today. This will be done by Dr. Claiborne Billings in 1-2 weeks.  Your physician recommends that you return for lab work and chest xray within 7 days of the procedure.   Your physician has recommended you make the following change in your medication: the lopressor( metoprolol) has been changed to 1 & 1/2 tablets daily. (75 mg)   :

## 2015-02-24 ENCOUNTER — Other Ambulatory Visit: Payer: Self-pay | Admitting: *Deleted

## 2015-02-24 DIAGNOSIS — Z01818 Encounter for other preprocedural examination: Secondary | ICD-10-CM

## 2015-02-25 LAB — CBC
HCT: 46.3 % (ref 39.0–52.0)
Hemoglobin: 16.4 g/dL (ref 13.0–17.0)
MCH: 32.3 pg (ref 26.0–34.0)
MCHC: 35.4 g/dL (ref 30.0–36.0)
MCV: 91.1 fL (ref 78.0–100.0)
MPV: 11.1 fL (ref 8.6–12.4)
PLATELETS: 196 10*3/uL (ref 150–400)
RBC: 5.08 MIL/uL (ref 4.22–5.81)
RDW: 14.1 % (ref 11.5–15.5)
WBC: 6.7 10*3/uL (ref 4.0–10.5)

## 2015-02-25 LAB — APTT: aPTT: 29 seconds (ref 24–37)

## 2015-02-25 LAB — PROTIME-INR
INR: 1.01 (ref <1.50)
Prothrombin Time: 13.4 seconds (ref 11.6–15.2)

## 2015-02-26 LAB — BASIC METABOLIC PANEL
BUN: 14 mg/dL (ref 7–25)
CHLORIDE: 106 mmol/L (ref 98–110)
CO2: 24 mmol/L (ref 20–31)
CREATININE: 0.9 mg/dL (ref 0.70–1.18)
Calcium: 9.1 mg/dL (ref 8.6–10.3)
Glucose, Bld: 111 mg/dL — ABNORMAL HIGH (ref 65–99)
Potassium: 4.4 mmol/L (ref 3.5–5.3)
Sodium: 141 mmol/L (ref 135–146)

## 2015-03-04 ENCOUNTER — Encounter (HOSPITAL_COMMUNITY)
Admission: RE | Disposition: A | Discharge status: Home / Self Care | Payer: Self-pay | Source: Ambulatory Visit | Attending: Cardiovascular Disease

## 2015-03-04 ENCOUNTER — Ambulatory Visit (HOSPITAL_COMMUNITY)
Admission: RE | Admit: 2015-03-04 | Discharge: 2015-03-04 | Disposition: A | Discharge status: Home / Self Care | Payer: 59 | Source: Ambulatory Visit | Attending: Cardiovascular Disease | Admitting: Cardiovascular Disease

## 2015-03-04 ENCOUNTER — Ambulatory Visit (HOSPITAL_COMMUNITY): Payer: 59 | Admitting: Anesthesiology

## 2015-03-04 ENCOUNTER — Encounter (HOSPITAL_COMMUNITY): Payer: Self-pay | Admitting: *Deleted

## 2015-03-04 DIAGNOSIS — I481 Persistent atrial fibrillation: Secondary | ICD-10-CM

## 2015-03-04 DIAGNOSIS — Z96653 Presence of artificial knee joint, bilateral: Secondary | ICD-10-CM | POA: Diagnosis not present

## 2015-03-04 DIAGNOSIS — Z961 Presence of intraocular lens: Secondary | ICD-10-CM | POA: Diagnosis not present

## 2015-03-04 DIAGNOSIS — G4730 Sleep apnea, unspecified: Secondary | ICD-10-CM | POA: Diagnosis not present

## 2015-03-04 DIAGNOSIS — I10 Essential (primary) hypertension: Secondary | ICD-10-CM | POA: Diagnosis not present

## 2015-03-04 DIAGNOSIS — G4733 Obstructive sleep apnea (adult) (pediatric): Secondary | ICD-10-CM | POA: Diagnosis not present

## 2015-03-04 DIAGNOSIS — Z01818 Encounter for other preprocedural examination: Secondary | ICD-10-CM

## 2015-03-04 DIAGNOSIS — Z7901 Long term (current) use of anticoagulants: Secondary | ICD-10-CM | POA: Diagnosis not present

## 2015-03-04 DIAGNOSIS — I4819 Other persistent atrial fibrillation: Secondary | ICD-10-CM | POA: Insufficient documentation

## 2015-03-04 DIAGNOSIS — I4891 Unspecified atrial fibrillation: Secondary | ICD-10-CM | POA: Diagnosis not present

## 2015-03-04 DIAGNOSIS — Z87891 Personal history of nicotine dependence: Secondary | ICD-10-CM | POA: Insufficient documentation

## 2015-03-04 DIAGNOSIS — M1990 Unspecified osteoarthritis, unspecified site: Secondary | ICD-10-CM | POA: Insufficient documentation

## 2015-03-04 DIAGNOSIS — Z79899 Other long term (current) drug therapy: Secondary | ICD-10-CM | POA: Diagnosis not present

## 2015-03-04 HISTORY — PX: CARDIOVERSION: SHX1299

## 2015-03-04 SURGERY — CARDIOVERSION
Anesthesia: General

## 2015-03-04 MED ORDER — PROPOFOL 10 MG/ML IV BOLUS
INTRAVENOUS | Status: DC | PRN
  Administered 2015-03-04: 80 mg via INTRAVENOUS

## 2015-03-04 MED ORDER — HYDROCORTISONE 1 % EX CREA
1.0000 "application " | TOPICAL_CREAM | Freq: Three times a day (TID) | CUTANEOUS | Status: DC | PRN
  Filled 2015-03-04: qty 28

## 2015-03-04 MED ORDER — SODIUM CHLORIDE 0.9 % IV SOLN: 250.0000 mL | INTRAVENOUS | Status: DC

## 2015-03-04 MED ORDER — SODIUM CHLORIDE 0.9 % IJ SOLN: 3.0000 mL | INTRAMUSCULAR | Status: DC | PRN

## 2015-03-04 MED ORDER — SODIUM CHLORIDE 0.9 % IV SOLN
INTRAVENOUS | Status: DC
  Administered 2015-03-04: 500 mL via INTRAVENOUS

## 2015-03-04 MED ORDER — LIDOCAINE HCL (CARDIAC) 20 MG/ML IV SOLN
INTRAVENOUS | Status: DC | PRN
  Administered 2015-03-04: 60 mg via INTRAVENOUS

## 2015-03-04 MED ORDER — HYDROCORTISONE 1 % EX CREA: 1.0000 "application " | TOPICAL_CREAM | Freq: Three times a day (TID) | CUTANEOUS | Status: DC | PRN

## 2015-03-04 MED ORDER — SODIUM CHLORIDE 0.9 % IJ SOLN: 3.0000 mL | Freq: Two times a day (BID) | INTRAMUSCULAR | Status: DC

## 2015-03-04 NOTE — CV Procedure (Signed)
  CARDIOVERSION NOTE   Procedure: Electrical Cardioversion Indications:  Atrial Fibrillation  Procedure Details:  Consent: Risks of procedure as well as the alternatives and risks of each were explained to the (patient/caregiver).  Consent for procedure obtained.  Time Out: Verified patient identification, verified procedure, site/side was marked, verified correct patient position, special equipment/implants available, medications/allergies/relevent history reviewed, required imaging and test results available.  Performed  Patient placed on cardiac monitor, pulse oximetry, supplemental oxygen as necessary.  Sedation given: lidocaine 60 mg and propofol 80 mg Pacer pads placed anterior and posterior chest.  Cardioverted 2 time(s).  Cardioverted at 150J biphasic, synchronized  Evaluation: Findings: Post procedure EKG shows: NSR Complications: None Patient did tolerate procedure well.   [name], MD, [facility] 03/04/2015 11:30 AM

## 2015-03-04 NOTE — H&P (View-Only) (Signed)
Patient ID: Garrett Munoz, male   DOB: 02/01/1946, 69 y.o.   MRN: [id number]     HPI: Garrett Munoz is a 69 y.o. male who presents to the office today for follow-up evaluation of recently diagnosed atrial fibrillation.  Garrett Munoz has a history of hypertension, and remotely had taken blood pressure medications for several years but none since the last 20 years. He has a history of prior hypertension, obstructive sleep apnea, untreated due to previous intolerance to full face mask, as well as obesity.  He admits to having an intermittent irregular rhythm.  He recently was scheduled to undergo elective surgery on his right foot hammertoe by Dr. Fritzi Mandes.  Surgery was canceled due to concerns for possible Mobitz type II block with possible atrial flutter.  He was seen by me for preoperative evaluation on 01/30/2015.  At that time, his ECG demonstrated atrial fibrillation with ventricular rate at approximately 100 bpm.  He was noted have small Q wave in lead 3.  He was started on Toprol 25 mg for 4 days and this was titrated up to 50 mg.  He also was started on eloquence 5 mg twice a day for anticoagulation.  A 2-D echo Doppler study done on February 18 2015 revealed an ejection fraction of 55-60%.  There was moderate left ventricular hypertrophy.  The left atrium was moderately dilated.  A nuclear perfusion study done on 02/13/2015 was low risk without ischemia with only a mild apical defect, probably attenuation. He denies any chest pain.  He denies shortness of breath.  He is been unable to walk secondary to his toe abnormality.  He admits to daytime sleepiness and snoring.  He has not utilize CPAP therapy in years.  He is status post cataract surgery with lens implant.    He denies PND, orthopnea.  He is unaware of his heart rhythm being irregular.  He denies bleeding as tolerated eloquence.  He presents for follow-up evaluation.  Past Medical History  Diagnosis Date  . Gout   . Cataract     right  eye  . Eye worm     right eye  . S/P total knee replacement     right  . DDD (degenerative disc disease), lumbosacral   . Radicular syndrome of left leg   . Left knee DJD   . Bronchitis     hx  . Pneumonia     hx  . Sleep apnea     can't use sleep study 10 yrs ago  . Dysrhythmia     irregular heatbeat  . 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 Retinal detachment surgery  03/2011    rt  . Eye examination under anesthesia w/ retinal cryotherapy and retinal laser  07/2011  . Cholecystectomy    . Knee arthroscopy      bilateral  . Total knee arthroplasty  2003    rt  . Eye surgery  02/2011    cat rt  . Total knee arthroplasty  02/07/2012    Procedure: TOTAL KNEE ARTHROPLASTY;  Surgeon: [name], MD;  Location: [place];  Service: Orthopedics;  Laterality: Left;    No Known Allergies  Current Outpatient Prescriptions  Medication Sig Dispense Refill  . acetaminophen (TYLENOL) 325 MG tablet Take 2 tablets (650 mg total) by mouth every 6 (six) hours as needed (or Fever >/= 101).    [name] allopurinol (ZYLOPRIM) 300 MG tablet  Take 300 mg by mouth daily.    [name] ALPRAZolam (XANAX) 1 MG tablet Take 1 tablet by mouth at bedtime.  2  . apixaban (ELIQUIS) 5 MG TABS tablet Take 1 tablet (5 mg total) by mouth 2 (two) times daily. 28 tablet 0  . fish oil-omega-3 fatty acids 1000 MG capsule Take 1 g by mouth daily.    . magnesium oxide (MAG-OX) 400 MG tablet Take 400 mg by mouth daily.    . metoprolol succinate (TOPROL-XL) 50 MG 24 hr tablet Take 1.5 tablets once daily. If pulse drops less than 56 then hold. 45 tablet 6  . Misc Natural Products (BLACK CHERRY CONCENTRATE PO) Take 1 tablet by mouth daily.    . temazepam (RESTORIL) 30 MG capsule Take 30 mg by mouth at bedtime.     No current facility-administered medications for this visit.    Social History   Social History  . Marital Status: Married    Spouse Name: N/A  . Number of  Children: N/A  . Years of Education: N/A   Occupational History  . Not on file.   Social History Main Topics  . Smoking status: Former Smoker -- 2.00 packs/day for 15 years    Types: Cigarettes    Quit date: 02/02/1992  . Smokeless tobacco: Not on file     Comment: occ alcohol  . Alcohol Use: 0.0 oz/week    0 Standard drinks or equivalent per week     Comment: occasional  . Drug Use: No  . Sexual Activity: Not on file   Other Topics Concern  . 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  He is married.   Family History  Problem Relation Age of Onset  . Lung cancer Father   . Lung cancer Sister    Family history is notable that his father died at 26 with lung CA and his mother died at 86.  She had an irregular heart rhythm.  He has 2 sisters and one is deceased secondary to cancer and 1 brother.  ROS General: Negative; No fevers, chills, or night sweats HEENT: He has a right eye lens  implant after cataract surgery No changes in vision or hearing, sinus congestion, difficulty swallowing Pulmonary: Negative; No cough, wheezing, shortness of breath, hemoptysis Cardiovascular: See HPI: No chest pain, presyncope, syncope, or awareness of palpatations GI: Negative; No nausea, vomiting, diarrhea, or abdominal pain GU: Negative; No dysuria, hematuria, or difficulty voiding Musculoskeletal: Negative; no myalgias, joint pain, or weakness Hematologic: Negative; no easy bruising, bleeding Endocrine: Negative; no heat/cold intolerance; no diabetes, Neuro: Negative; no changes in balance, headaches Skin: Negative; No rashes or skin lesions Psychiatric: Negative; No behavioral problems, depression Sleep: Positive for untreated sleep apnea; positive for snoring and hypersomnolence. Other comprehensive 14 point system review is negative   Physical Exam BP 140/88 mmHg  Pulse 99  Ht 5' 11" (1.803 m)   Wt 298 lb 4.8 oz (135.308 kg)  BMI 41.62 kg/m2  Repeat blood pressure 130/86.  Wt Readings from Last 3 Encounters:  02/21/15 298 lb 4.8 oz (135.308 kg)  02/11/15 297 lb (134.718 kg)  01/30/15 297 lb 12.8 oz (135.081 kg)   General: Alert, oriented, no distress.  Skin: normal turgor, no rashes, warm and dry HEENT: Normocephalic, atraumatic. Pupils equal round and reactive to light; sclera anicteric; extraocular muscles intact, No lid lag; Nose without nasal  septal hypertrophy; Mouth/Parynx benign; Mallinpatti scale 3 Neck: No JVD, no carotid bruits; normal carotid upstroke Lungs: clear to ausculatation and percussion bilaterally; no wheezing or rales, normal inspiratory and expiratory effort Chest wall: without tenderness to palpitation Heart: Irregularly irregular rhythm with a rate at approximately mid 90s, s1 s2 normal, 1/6 systolic murmur, No diastolic murmur, no rubs, gallops, thrills, or heaves Abdomen: Moderate central adiposity soft, nontender; no hepatosplenomehaly, BS+; abdominal aorta nontender and not dilated by palpation. Back: no CVA tenderness Pulses: 2+  Musculoskeletal: full range of motion, normal strength, no joint deformities Extremities: Pulses 2+, no clubbing cyanosis or edema, Homan's sign negative  Neurologic: grossly nonfocal; Cranial nerves grossly wnl Psychologic: Normal mood and affect  ECG (independently read by me): Atrial fibrillation at 99 bpm.   01/30/2015 ECG (independently read by me): Atrial fibrillation at a approximately 90-100 bpm.  Small Q wave in lead 3.  Early transition.  LABS:  BMP Latest Ref Rng 01/30/2015 02/08/2012 02/02/2012  Glucose 65 - 99 mg/dL 96 153(H) 104(H)  BUN 7 - 25 mg/dL _0 Creatinine 0.70 - 1.18 mg/dL 0.97 0.75 0.85  Sodium 135 - 146 mmol/L 142 137 140  Potassium 3.5 - 5.3 mmol/L 4.8 4.4 4.6  Chloride 98 - 110 mmol/L 104 102 104  CO2 20 - 31 mmol/L _1 Calcium 8.6 - 10.3 mg/dL 9.5 8.8 9.7    Hepatic  Function Latest Ref Rng 01/30/2015 02/02/2012  Total Protein 6.1 - 8.1 g/dL 6.7 7.9  Albumin 3.6 - 5.1 g/dL 4.0 4.1  AST 10 - 35 U/L 18 21  ALT 9 - 46 U/L 18 20  Alk Phosphatase 40 - 115 U/L 60 76  Total Bilirubin 0.2 - 1.2 mg/dL 0.4 0.4    CBC Latest Ref Rng 01/30/2015 02/08/2012 02/04/2012  WBC 4.0 - 10.5 K/uL 6.6 14.7(H) 8.8  Hemoglobin 13.0 - 17.0 g/dL 16.6 13.6 15.9  Hematocrit 39.0 - 52.0 % 47.2 39.4 45.2  Platelets 150 - 400 K/uL 196 181 186   Lab Results  Component Value Date   MCV 92.9 01/30/2015   MCV 91.6 02/08/2012   MCV 91.5 02/04/2012    Lab Results  Component Value Date   TSH 0.834 01/30/2015    BNP No results found for: BNP  ProBNP No results found for: PROBNP   Lipid Panel     Component Value Date/Time   CHOL 143 01/30/2015 0920   TRIG 136 01/30/2015 0920   HDL 51 01/30/2015 0920   CHOLHDL 2.8 01/30/2015 0920   VLDL 27 01/30/2015 0920   LDLCALC 65 01/30/2015 0920     RADIOLOGY: No results found.    ASSESSMENT AND PLAN: Garrett Munoz is a 69 year old gentleman who has a history of morbid obesity with a body mass index of 41.62, a remote history of hypertension and a history of obstructive sleep apnea, untreated for many years.  When I saw him 3 weeks ago, his ECG revealed atrial fibrillation of questionable duration.  His elective surgery was canceled due to his abnormal rhythm.  He has not been very active due to his toe abnormality.  I reviewed his recent laboratory with him in detail and also reviewed his echo Doppler findings as well as his nuclear perfusion study.  His ventricular rate is still in the upper 90s.  I will now further titrate his Toprol to 75 mg daily.  I discussed potential outpatient DC cardioversion.  In several weeks he will undergo attempt at  restoration of sinus rhythm on his increased beta blocker regimen after he has been on eliquis for ~ 4-5 weeks.  I discussed the risk, benefits of the cardioversion procedure.   Repeat blood work will be obtained prior to his scheduled date.  Time spent: 25 minutes  [name], MD, [facility]  02/21/2015 3:12 PM

## 2015-03-04 NOTE — Interval H&P Note (Signed)
History and Physical Interval Note:  03/04/2015 11:25 AM  Garrett Munoz  has presented today for surgery, with the diagnosis of AFIB  The various methods of treatment have been discussed with the patient and family. After consideration of risks, benefits and other options for treatment, the patient has consented to  Procedure(s): CARDIOVERSION (N/A) as a surgical intervention .  The patient's history has been reviewed, patient examined, no change in status, stable for surgery.  I have reviewed the patient's chart and labs.  Questions were answered to the patient's satisfaction.     Zadia Uhde A

## 2015-03-04 NOTE — Discharge Instructions (Signed)
Electrical Cardioversion, Care After °Refer to this sheet in the next few weeks. These instructions provide you with information on caring for yourself after your procedure. Your health care provider may also give you more specific instructions. Your treatment has been planned according to current medical practices, but problems sometimes occur. Call your health care provider if you have any problems or questions after your procedure. °WHAT TO EXPECT AFTER THE PROCEDURE °After your procedure, it is typical to have the following sensations: °· Some redness on the skin where the shocks were delivered. If this is tender, a sunburn lotion or hydrocortisone cream may help. °· Possible return of an abnormal heart rhythm within hours or days after the procedure. °HOME CARE INSTRUCTIONS °· Take medicines only as directed by your health care provider. Be sure you understand how and when to take your medicine. °· Learn how to feel your pulse and check it often. °· Limit your activity for 48 hours after the procedure or as directed by your health care provider. °· Avoid or minimize caffeine and other stimulants as directed by your health care provider. °SEEK MEDICAL CARE IF: °· You feel like your heart is beating too fast or your pulse is not regular. °· You have any questions about your medicines. °· You have bleeding that will not stop. °SEEK IMMEDIATE MEDICAL CARE IF: °· You are dizzy or feel faint. °· It is hard to breathe or you feel short of breath. °· There is a change in discomfort in your chest. °· Your speech is slurred or you have trouble moving an arm or leg on one side of your body. °· You get a serious muscle cramp that does not go away. °· Your fingers or toes turn cold or blue. °  °This information is not intended to replace advice given to you by your health care provider. Make sure you discuss any questions you have with your health care provider. °  °Document Released: 01/24/2013 Document Revised: 04/26/2014  Document Reviewed: 01/24/2013 °Elsevier Interactive Patient Education ©2016 [employer] ° °

## 2015-03-04 NOTE — Anesthesia Postprocedure Evaluation (Signed)
  Anesthesia Post-op Note  Patient: Garrett Munoz  Procedure(s) Performed: Procedure(s): CARDIOVERSION (N/A)  Patient Location: PACU  Anesthesia Type:General  Level of Consciousness: awake and alert   Airway and Oxygen Therapy: Patient Spontanous Breathing  Post-op Pain: Controlled  Post-op Assessment: Post-op Vital signs reviewed, Patient's Cardiovascular Status Stable and Respiratory Function Stable  Post-op Vital Signs: Reviewed  Filed Vitals:   03/04/15 1140  BP: 107/69  Pulse: 74  Temp:   Resp: 17    Complications: No apparent anesthesia complications

## 2015-03-04 NOTE — Transfer of Care (Signed)
Immediate Anesthesia Transfer of Care Note  Patient: Garrett Munoz  Procedure(s) Performed: Procedure(s): CARDIOVERSION (N/A)  Patient Location: Endoscopy Unit  Anesthesia Type:MAC  Level of Consciousness: awake, alert  and oriented  Airway & Oxygen Therapy: Patient Spontanous Breathing and Patient connected to nasal cannula oxygen  Post-op Assessment: Report given to RN and Post -op Vital signs reviewed and stable  Post vital signs: Reviewed and stable  Last Vitals:  Filed Vitals:   03/04/15 1019  BP: 122/84  Pulse: 85  Temp: 36.5 C  Resp: 16    Complications: No apparent anesthesia complications

## 2015-03-04 NOTE — Anesthesia Preprocedure Evaluation (Addendum)
Anesthesia Evaluation  Patient identified by MRN, date of birth, ID band Patient awake    Reviewed: Allergy & Precautions, H&P , NPO status , Patient's Chart, lab work & pertinent test results, reviewed documented beta blocker date and time   Airway Mallampati: II  TM Distance: >3 FB Neck ROM: Full    Dental no notable dental hx. (+) Upper Dentures, Dental Advisory Given   Pulmonary sleep apnea , former smoker,    Pulmonary exam normal breath sounds clear to auscultation       Cardiovascular Pt. on home beta blockers + dysrhythmias Atrial Fibrillation  Rhythm:Irregular Rate:Normal     Neuro/Psych negative neurological ROS  negative psych ROS   GI/Hepatic negative GI ROS, Neg liver ROS,   Endo/Other  Morbid obesity  Renal/GU negative Renal ROS  negative genitourinary   Musculoskeletal  (+) Arthritis , Osteoarthritis,    Abdominal   Peds  Hematology negative hematology ROS (+)   Anesthesia Other Findings   Reproductive/Obstetrics negative OB ROS                            Anesthesia Physical Anesthesia Plan  ASA: III  Anesthesia Plan: General   Post-op Pain Management:    Induction: Intravenous  Airway Management Planned: Mask  Additional Equipment:   Intra-op Plan:   Post-operative Plan:   Informed Consent: I have reviewed the patients History and Physical, chart, labs and discussed the procedure including the risks, benefits and alternatives for the proposed anesthesia with the patient or authorized representative who has indicated his/her understanding and acceptance.   Dental advisory given  Plan Discussed with: CRNA  Anesthesia Plan Comments:         Anesthesia Quick Evaluation

## 2015-03-05 ENCOUNTER — Encounter (HOSPITAL_COMMUNITY): Payer: Self-pay | Admitting: Cardiovascular Disease

## 2015-03-18 ENCOUNTER — Ambulatory Visit (INDEPENDENT_AMBULATORY_CARE_PROVIDER_SITE_OTHER): Payer: 59 | Admitting: Physician Assistant

## 2015-03-18 ENCOUNTER — Encounter: Payer: Self-pay | Admitting: Physician Assistant

## 2015-03-18 VITALS — BP 122/88 | HR 69 | Ht 71.0 in | Wt 302.2 lb

## 2015-03-18 DIAGNOSIS — Z7901 Long term (current) use of anticoagulants: Secondary | ICD-10-CM

## 2015-03-18 DIAGNOSIS — I4819 Other persistent atrial fibrillation: Secondary | ICD-10-CM

## 2015-03-18 DIAGNOSIS — I481 Persistent atrial fibrillation: Secondary | ICD-10-CM | POA: Diagnosis not present

## 2015-03-18 NOTE — Patient Instructions (Signed)
Your physician recommends that you schedule a follow-up appointment in: 3-4 months with Dr. Claiborne Billings.  No changes were made today in your therapy.

## 2015-03-18 NOTE — Progress Notes (Signed)
Cardiology Office Note   Date:  03/18/2015   ID:  JIMM SALK, DOB [birth year], MRN [id number]  PCP:  Thressa Sheller, MD  Cardiologist:  Dr Meredeth Ide, PA-C   Chief Complaint  Patient presents with  . Follow-up    Pt states no chest pain no SOB no light headedness or dizziness no edema    History of Present Illness: Garrett Munoz is a 69 y.o. male with a history of atrial fibrillation on Eliquis, s/p DCCV 11/15, HTN, gout, and DDD in his lower back.  Garrett Munoz presents for follow-up after cardioversion.  Garrett Munoz is struggling right now due to joint issues. He states he would like to get an injection in his back but the pain clinic M.D. told him he could not do that because of his blood thinners.  He is back in atrial fibrillation today but the rate is controlled. He is not aware of the atrial fibrillation. He denies chest pain, shortness of breath, increased dyspnea on exertion, palpitations, presyncope or being lightheaded. He states he has no awareness of the atrial fibrillation. He does not feel the atrial fibrillation limits his activity in any way.  He is frustrated because he states the doctor that fixed his cataract but the wrong lens in. Additionally, the musculoskeletal pain is getting worse which does every year when it gets colder outside.  He seems depressed about his physical limitations. He wants to give up and quit taking all of his medications.   Past Medical History  Diagnosis Date  . Gout   . Cataract     right eye  . Eye worm     right eye  . S/P total knee replacement     right  . DDD (degenerative disc disease), lumbosacral   . Radicular syndrome of left leg   . Left knee DJD   . Bronchitis     hx  . Pneumonia     hx  . Sleep apnea     can't use sleep study 10 yrs ago  . Dysrhythmia     irregular heatbeat  . 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 Retinal detachment  surgery  03/2011    rt  . Eye examination under anesthesia w/ retinal cryotherapy and retinal laser  07/2011  . Cholecystectomy    . Knee arthroscopy      bilateral  . Total knee arthroplasty  2003    rt  . Eye surgery  02/2011    cat rt  . Total knee arthroplasty  02/07/2012    Procedure: TOTAL KNEE ARTHROPLASTY;  Surgeon: [name], MD;  Location: [place];  Service: Orthopedics;  Laterality: Left;  . Cardioversion N/A 03/04/2015    Procedure: CARDIOVERSION;  Surgeon: [name], MD;  Location: [facility]COPY;  Service: Cardiovascular;  Laterality: N/A;    Current Outpatient Prescriptions  Medication Sig Dispense Refill  . acetaminophen (TYLENOL) 325 MG tablet Take 2 tablets (650 mg total) by mouth every 6 (six) hours as needed (or Fever >/= 101).    [name] allopurinol (ZYLOPRIM) 300 MG tablet Take 300 mg by mouth daily.    [name] ALPRAZolam (XANAX) 1 MG tablet Take 1 mg by mouth at bedtime.   2  . apixaban (ELIQUIS) 5 MG TABS tablet Take 1 tablet (5 mg total) by mouth 2 (two) times daily. 28 tablet 0  . fish oil-omega-3 fatty acids  1000 MG capsule Take 1 g by mouth daily.    . hydrocortisone cream 1 % Apply 1 application topically 3 (three) times daily as needed for itching (skin irritation). 30 g 0  . magnesium oxide (MAG-OX) 400 MG tablet Take 400 mg by mouth daily.    . metoprolol succinate (TOPROL-XL) 50 MG 24 hr tablet Take 1.5 tablets once daily. If pulse drops less than 56 then hold. (Patient taking differently: Take 75 mg by mouth daily. If pulse drops less than 56 then hold.) 45 tablet 6  . Misc Natural Products (BLACK CHERRY CONCENTRATE PO) Take 1 tablet by mouth daily.    . temazepam (RESTORIL) 30 MG capsule Take 30 mg by mouth at bedtime.     No current facility-administered medications for this visit.    Allergies:   Review of patient's allergies indicates no known allergies.    Social History:  The patient  reports that he quit smoking about 23 years ago. His smoking use  included Cigarettes. He has a 30 pack-year smoking history. He does not have any smokeless tobacco history on file. He reports that he drinks alcohol. He reports that he does not use illicit drugs.   Family History:  The patient's family history includes Lung cancer in his father and sister.    ROS:  Please see the history of present illness. All other systems are reviewed and negative.    PHYSICAL EXAM: VS:  BP 122/88 mmHg  Pulse 69  Ht 5\' 11"  (1.803 m)  Wt 302 lb 3.2 oz (137.077 kg)  BMI 42.17 kg/m2 , BMI Body mass index is 42.17 kg/(m^2). GEN: Well nourished, well developed, male in no acute distress HEENT: normal for age  Neck: no JVD, no carotid bruit, no masses Cardiac: Irregular rate and rhythm; no murmur, no rubs, or gallops Respiratory:  clear to auscultation bilaterally, normal work of breathing GI: soft, nontender, nondistended, + BS MS: no deformity or atrophy; no edema; distal pulses are 2+ in all 4 extremities  Skin: warm and dry, no rash Neuro:  Strength and sensation are intact Psych: euthymic mood, full affect   EKG:  EKG is ordered today. The ekg ordered today demonstrates atrial fibrillation, heart rate is 69   Recent Labs: 01/30/2015: ALT 18; Magnesium 1.9; TSH 0.834 02/25/2015: BUN 14; Creat 0.90; Hemoglobin 16.4; Platelets 196; Potassium 4.4; Sodium 141    Lipid Panel    Component Value Date/Time   CHOL 143 01/30/2015 0920   TRIG 136 01/30/2015 0920   HDL 51 01/30/2015 0920   CHOLHDL 2.8 01/30/2015 0920   VLDL 27 01/30/2015 0920   LDLCALC 65 01/30/2015 0920     Wt Readings from Last 3 Encounters:  03/18/15 302 lb 3.2 oz (137.077 kg)  03/04/15 298 lb (135.172 kg)  02/21/15 298 lb 4.8 oz (135.308 kg)     Other studies Reviewed: Additional studies/ records that were reviewed today include: Office Notes, hospital records and previous ECG.  ASSESSMENT AND PLAN:  1.  Atrial fibrillation: Garrett Munoz heart rate is controlled right now. He states  he does not wish to go through another cardioversion. It is not clear why as there were no obvious complications, but he had a bad experience with it.   Reinforced the need to continue anticoagulation which he understands because his mother had a stroke prior to her death. I reinforced the need to keep taking the beta blocker because his heart rate is controlled right now and he is more likely  to feel bad from the atrial fibrillation if his heart rate is not controlled. I advised that if he did not wish to undergo another cardioversion we would not attempt this.  2. Chronic anticoagulation: He is compliant with the Eliquis and not having any bleeding issues. He is to continue this.   Current medicines are reviewed at length with the patient today.  The patient has concerns regarding medicines. All concerns were addressed  The following changes have been made:  no change  Labs/ tests ordered today include: ECG   Disposition:   FU with Dr. Claiborne Billings  Signed, Lenoard Aden  03/18/2015 2:37 PM    [address], [place], [place]  [address] Phone: [phone]; Fax: [phone]

## 2015-03-20 ENCOUNTER — Telehealth: Payer: Self-pay | Admitting: Cardiovascular Disease

## 2015-03-20 NOTE — Telephone Encounter (Signed)
Did not need this encounter at this time 

## 2015-04-02 ENCOUNTER — Other Ambulatory Visit: Payer: Self-pay | Admitting: Cardiovascular Disease

## 2015-04-02 MED ORDER — APIXABAN 5 MG PO TABS: 5.0000 mg | ORAL_TABLET | Freq: Two times a day (BID) | ORAL | Status: DC

## 2015-04-02 MED ORDER — METOPROLOL SUCCINATE ER 50 MG PO TB24: ORAL_TABLET | ORAL | Status: DC

## 2015-04-02 NOTE — Telephone Encounter (Signed)
°*  STAT* If patient is at the pharmacy, call can be transferred to refill team.   1. Which medications need to be refilled? (please list name of each medication and dose if known) Eliquis, and Metoprolol  2. Which pharmacy/location (including street and city if local pharmacy) is medication to be sent to?[phone] 3. Do they need a 30 day or 90 day supply? 90 and refills

## 2015-04-10 ENCOUNTER — Telehealth: Payer: Self-pay | Admitting: Cardiovascular Disease

## 2015-04-10 ENCOUNTER — Inpatient Hospital Stay (HOSPITAL_COMMUNITY)
Admission: EM | Admit: 2015-04-10 | Discharge: 2015-04-11 | DRG: 194 | Discharge status: Against Medical Advice | Payer: 59 | Attending: Internal Medicine | Admitting: Internal Medicine

## 2015-04-10 ENCOUNTER — Encounter (HOSPITAL_COMMUNITY): Payer: Self-pay

## 2015-04-10 ENCOUNTER — Emergency Department (HOSPITAL_COMMUNITY): Payer: 59

## 2015-04-10 DIAGNOSIS — M109 Gout, unspecified: Secondary | ICD-10-CM | POA: Diagnosis present

## 2015-04-10 DIAGNOSIS — J189 Pneumonia, unspecified organism: Secondary | ICD-10-CM | POA: Diagnosis not present

## 2015-04-10 DIAGNOSIS — Z87891 Personal history of nicotine dependence: Secondary | ICD-10-CM | POA: Diagnosis not present

## 2015-04-10 DIAGNOSIS — M5137 Other intervertebral disc degeneration, lumbosacral region: Secondary | ICD-10-CM | POA: Diagnosis not present

## 2015-04-10 DIAGNOSIS — R5383 Other fatigue: Secondary | ICD-10-CM | POA: Diagnosis present

## 2015-04-10 DIAGNOSIS — G4733 Obstructive sleep apnea (adult) (pediatric): Secondary | ICD-10-CM | POA: Diagnosis not present

## 2015-04-10 DIAGNOSIS — H269 Unspecified cataract: Secondary | ICD-10-CM | POA: Diagnosis not present

## 2015-04-10 DIAGNOSIS — I4891 Unspecified atrial fibrillation: Secondary | ICD-10-CM

## 2015-04-10 DIAGNOSIS — R079 Chest pain, unspecified: Secondary | ICD-10-CM | POA: Diagnosis not present

## 2015-04-10 DIAGNOSIS — Z79899 Other long term (current) drug therapy: Secondary | ICD-10-CM

## 2015-04-10 DIAGNOSIS — Y95 Nosocomial condition: Secondary | ICD-10-CM | POA: Diagnosis present

## 2015-04-10 DIAGNOSIS — I10 Essential (primary) hypertension: Secondary | ICD-10-CM | POA: Diagnosis not present

## 2015-04-10 DIAGNOSIS — Z96651 Presence of right artificial knee joint: Secondary | ICD-10-CM | POA: Diagnosis present

## 2015-04-10 DIAGNOSIS — Z6841 Body Mass Index (BMI) 40.0 and over, adult: Secondary | ICD-10-CM

## 2015-04-10 DIAGNOSIS — F419 Anxiety disorder, unspecified: Secondary | ICD-10-CM | POA: Diagnosis not present

## 2015-04-10 DIAGNOSIS — Z66 Do not resuscitate: Secondary | ICD-10-CM | POA: Diagnosis not present

## 2015-04-10 LAB — HEPATIC FUNCTION PANEL
ALBUMIN: 3.6 g/dL (ref 3.5–5.0)
ALK PHOS: 64 U/L (ref 38–126)
ALT: 30 U/L (ref 17–63)
AST: 29 U/L (ref 15–41)
BILIRUBIN DIRECT: 0.2 mg/dL (ref 0.1–0.5)
BILIRUBIN TOTAL: 0.8 mg/dL (ref 0.3–1.2)
Indirect Bilirubin: 0.6 mg/dL (ref 0.3–0.9)
Total Protein: 6.5 g/dL (ref 6.5–8.1)

## 2015-04-10 LAB — CBC
HCT: 47.7 % (ref 39.0–52.0)
HEMOGLOBIN: 15.8 g/dL (ref 13.0–17.0)
MCH: 31.5 pg (ref 26.0–34.0)
MCHC: 33.1 g/dL (ref 30.0–36.0)
MCV: 95.2 fL (ref 78.0–100.0)
Platelets: 166 10*3/uL (ref 150–400)
RBC: 5.01 MIL/uL (ref 4.22–5.81)
RDW: 13.4 % (ref 11.5–15.5)
WBC: 12.2 10*3/uL — ABNORMAL HIGH (ref 4.0–10.5)

## 2015-04-10 LAB — TROPONIN I: Troponin I: 0.03 ng/mL (ref <0.031)

## 2015-04-10 LAB — BASIC METABOLIC PANEL
ANION GAP: 10 (ref 5–15)
BUN: 8 mg/dL (ref 6–20)
CALCIUM: 9.2 mg/dL (ref 8.9–10.3)
CO2: 27 mmol/L (ref 22–32)
Chloride: 103 mmol/L (ref 101–111)
Creatinine, Ser: 1.03 mg/dL (ref 0.61–1.24)
GFR calc Af Amer: >60 mL/min (ref >60)
GLUCOSE: 138 mg/dL — AB (ref 65–99)
Potassium: 4.7 mmol/L (ref 3.5–5.1)
Sodium: 140 mmol/L (ref 135–145)

## 2015-04-10 LAB — LIPASE, BLOOD: Lipase: 43 U/L (ref 11–51)

## 2015-04-10 LAB — EXPECTORATED SPUTUM ASSESSMENT W REFEX TO RESP CULTURE

## 2015-04-10 LAB — TSH: TSH: 1.3 u[IU]/mL (ref 0.350–4.500)

## 2015-04-10 LAB — EXPECTORATED SPUTUM ASSESSMENT W GRAM STAIN, RFLX TO RESP C

## 2015-04-10 LAB — I-STAT TROPONIN, ED: TROPONIN I, POC: 0 ng/mL (ref 0.00–0.08)

## 2015-04-10 LAB — STREP PNEUMONIAE URINARY ANTIGEN: Strep Pneumo Urinary Antigen: NEGATIVE

## 2015-04-10 MED ORDER — ALLOPURINOL 300 MG PO TABS
300.0000 mg | ORAL_TABLET | Freq: Every day | ORAL | Status: DC
  Administered 2015-04-10: 300 mg via ORAL
  Filled 2015-04-10: qty 1

## 2015-04-10 MED ORDER — ALLOPURINOL 300 MG PO TABS
300.0000 mg | ORAL_TABLET | Freq: Every day | ORAL | Status: DC
  Filled 2015-04-10: qty 1

## 2015-04-10 MED ORDER — TEMAZEPAM 15 MG PO CAPS
30.0000 mg | ORAL_CAPSULE | Freq: Every day | ORAL | Status: DC
  Administered 2015-04-10: 30 mg via ORAL
  Filled 2015-04-10: qty 2

## 2015-04-10 MED ORDER — ALPRAZOLAM 0.5 MG PO TABS: 1.0000 mg | ORAL_TABLET | Freq: Every day | ORAL | Status: DC | PRN

## 2015-04-10 MED ORDER — DEXTROSE 5 % IV SOLN
1.0000 g | Freq: Three times a day (TID) | INTRAVENOUS | Status: DC
  Administered 2015-04-10 – 2015-04-11 (×3): 1 g via INTRAVENOUS
  Filled 2015-04-10 (×5): qty 1

## 2015-04-10 MED ORDER — MAGNESIUM OXIDE 400 (241.3 MG) MG PO TABS
400.0000 mg | ORAL_TABLET | Freq: Every day | ORAL | Status: DC
  Filled 2015-04-10: qty 1

## 2015-04-10 MED ORDER — METOPROLOL SUCCINATE ER 50 MG PO TB24: 75.0000 mg | ORAL_TABLET | Freq: Every day | ORAL | Status: DC

## 2015-04-10 MED ORDER — ONDANSETRON HCL 4 MG/2ML IJ SOLN: 4.0000 mg | Freq: Once | INTRAMUSCULAR | Status: DC

## 2015-04-10 MED ORDER — APIXABAN 5 MG PO TABS
5.0000 mg | ORAL_TABLET | Freq: Two times a day (BID) | ORAL | Status: DC
  Administered 2015-04-10: 5 mg via ORAL
  Filled 2015-04-10: qty 1

## 2015-04-10 MED ORDER — DEXTROSE 5 % IV SOLN
1.0000 g | Freq: Once | INTRAVENOUS | Status: AC
  Administered 2015-04-10: 1 g via INTRAVENOUS
  Filled 2015-04-10: qty 10

## 2015-04-10 MED ORDER — ACETAMINOPHEN 325 MG PO TABS: 650.0000 mg | ORAL_TABLET | Freq: Four times a day (QID) | ORAL | Status: DC | PRN

## 2015-04-10 MED ORDER — VANCOMYCIN HCL IN DEXTROSE 1-5 GM/200ML-% IV SOLN
1000.0000 mg | Freq: Two times a day (BID) | INTRAVENOUS | Status: DC
  Administered 2015-04-11: 1000 mg via INTRAVENOUS
  Filled 2015-04-10 (×2): qty 200

## 2015-04-10 MED ORDER — DEXTROSE 5 % IV SOLN
500.0000 mg | Freq: Once | INTRAVENOUS | Status: AC
  Administered 2015-04-10: 500 mg via INTRAVENOUS
  Filled 2015-04-10 (×2): qty 500

## 2015-04-10 MED ORDER — SODIUM CHLORIDE 0.9 % IV SOLN
2500.0000 mg | Freq: Once | INTRAVENOUS | Status: AC
  Administered 2015-04-10: 2500 mg via INTRAVENOUS
  Filled 2015-04-10: qty 2500

## 2015-04-10 NOTE — Progress Notes (Signed)
Patient admitted to room 2w03 alert and oriented X4. Able to make needs known. No C/O pain/discomfort at this time. No shortness of breath noted. Orientation to room and unit completed. Will continue to monitor.

## 2015-04-10 NOTE — Telephone Encounter (Signed)
[name] is calling because Garrett Munoz woke up this morning because he was having chest pains ,  Chest Pains have eased up that was going down his back and nausea and he was very cold , Took Tylenol and is on Eliquis .[name] Please call    Thanks

## 2015-04-10 NOTE — ED Notes (Signed)
Pt arrives with c/o chest pain with dry cough for last couple of days. Chest pain started at 0400 and radiates into back. Pt states back pain is chronic.

## 2015-04-10 NOTE — Progress Notes (Signed)
ANTIBIOTIC CONSULT NOTE - INITIAL  Pharmacy Consult for Vanc/Cefepime Indication: HCAP  No Known Allergies  Patient Measurements: Height: 5\' 10"  (177.8 cm) Weight: (!) 304 lb 0.2 oz (137.9 kg) IBW/kg (Calculated) : 73   Vital Signs: Temp: 98.3 F (36.8 C) (12/22 1411) Temp Source: Oral (12/22 1411) BP: 111/85 mmHg (12/22 1411) Pulse Rate: 99 (12/22 1411) Intake/Output from previous day:   Intake/Output from this shift:    Labs:  Recent Labs  04/10/15 1005  WBC 12.2*  HGB 15.8  PLT 166  CREATININE 1.03   Estimated Creatinine Clearance: 92.1 mL/min (by C-G formula based on Cr of 1.03). No results for input(s): VANCOTROUGH, VANCOPEAK, VANCORANDOM, GENTTROUGH, GENTPEAK, GENTRANDOM, TOBRATROUGH, TOBRAPEAK, TOBRARND, AMIKACINPEAK, AMIKACINTROU, AMIKACIN in the last 72 hours.   Microbiology: No results found for this or any previous visit (from the past 720 hour(s)).  Medical History: Past Medical History  Diagnosis Date  . Gout   . Cataract     right eye  . Eye worm     right eye  . S/P total knee replacement     right  . DDD (degenerative disc disease), lumbosacral   . Radicular syndrome of left leg   . Left knee DJD   . Bronchitis     hx  . Pneumonia     hx  . Sleep apnea     can't use sleep study 10 yrs ago  . Dysrhythmia     irregular heatbeat  . 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     Assessment: 52 yom with CP, chills, N/V. Pharmacy consulted to dose vanc/cefepime for HCAP empiric coverage. Afeb, wbc 12.2 on admit. SCr 1.03 (baseline SCr ~0.7-1), normalized CrCl~66. Received 1x doses of CTX/azithro in the ED.  12/22 vanc>> 12/22 cefepime>>  12/22 BCx2>> 12/22 UC>> 12/22 Sputum>>  Goal of Therapy:  Vancomycin trough level 15-20 mcg/ml  Plan:  Cefepime 1g IV q8h Vanc 2500mg  IV x 1; then Vanc 1g IV q12h per obesity nomogram Monitor clinical progress, c/s, renal function, abx plan/LOT VT@SS  as indicated   Elicia Lamp, PharmD, [facility] Clinical  Pharmacist Pager [phone] 04/10/2015 4:21 PM

## 2015-04-10 NOTE — ED Provider Notes (Signed)
CSN: [id number]     Arrival date & time 04/10/15  [id number] History   First MD Initiated Contact with Patient 04/10/15 1012     Chief Complaint  Patient presents with  . Chest Pain     (Consider location/radiation/quality/duration/timing/severity/associated sxs/prior Treatment) HPI Comments: Patient presents with chest pain and nausea. He states he woke up this morning feeling nauseated. His wife states that he looked pale and broke out in a sweat. As he was walking to the bathroom, he developed chest pain. He describes it as achy pain in the center of his chest. It's otherwise nonradiating. He does have some back pain but he feels that's more related to his arthritis. He denies any shortness of breath. He's had nausea but no vomiting. He denies abdominal pain. He has arthritis pain is unchanged from his baseline. He denies any fevers. There is no vomiting or diarrhea. He has had a little bit of a cough that started today but seems to be better now. Eyes any increase in leg swelling. He does have a history of atrial fibrillation. He's had recurrent cardioversion which is unsuccessful. His last cardioversion was about 2 weeks ago. He is on Eliquis.  Patient is a 69 y.o. male presenting with chest pain.  Chest Pain Associated symptoms: cough, diaphoresis and nausea   Associated symptoms: no abdominal pain, no back pain, no dizziness, no fatigue, no fever, no headache, no numbness, no shortness of breath, not vomiting and no weakness     Past Medical History  Diagnosis Date  . Gout   . Cataract     right eye  . Eye worm     right eye  . S/P total knee replacement     right  . DDD (degenerative disc disease), lumbosacral   . Radicular syndrome of left leg   . Left knee DJD   . Bronchitis     hx  . Pneumonia     hx  . Sleep apnea     can't use sleep study 10 yrs ago  . Dysrhythmia     irregular heatbeat  . 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    Past Surgical History  Procedure Laterality Date   . Retinal detachment surgery  03/2011    rt  . Eye examination under anesthesia w/ retinal cryotherapy and retinal laser  07/2011  . Cholecystectomy    . Knee arthroscopy      bilateral  . Total knee arthroplasty  2003    rt  . Eye surgery  02/2011    cat rt  . Total knee arthroplasty  02/07/2012    Procedure: TOTAL KNEE ARTHROPLASTY;  Surgeon: [name], MD;  Location: [place];  Service: Orthopedics;  Laterality: Left;  . Cardioversion N/A 03/04/2015    Procedure: CARDIOVERSION;  Surgeon: [name], MD;  Location: [facility] ENDOSCOPY;  Service: Cardiovascular;  Laterality: N/A;   Family History  Problem Relation Age of Onset  . Lung cancer Father   . Lung cancer Sister    Social History  Substance Use Topics  . Smoking status: Former Smoker -- 2.00 packs/day for 15 years    Types: Cigarettes    Quit date: 02/02/1992  . Smokeless tobacco: None     Comment: occ alcohol  . Alcohol Use: 0.0 oz/week    0 Standard drinks or equivalent per week     Comment: occasional    Review of Systems  Constitutional: Positive for diaphoresis. Negative for fever, chills and fatigue.  HENT: Negative for congestion, rhinorrhea and sneezing.   Eyes: Negative.   Respiratory: Positive for cough. Negative for chest tightness and shortness of breath.   Cardiovascular: Positive for chest pain. Negative for leg swelling.  Gastrointestinal: Positive for nausea. Negative for vomiting, abdominal pain, diarrhea and blood in stool.  Genitourinary: Negative for frequency, hematuria, flank pain and difficulty urinating.  Musculoskeletal: Negative for back pain and arthralgias.  Skin: Negative for rash.  Neurological: Negative for dizziness, speech difficulty, weakness, numbness and headaches.      Allergies  Review of patient's allergies indicates no known allergies.  Home Medications   Prior to Admission medications   Medication Sig Start Date End Date Taking? Authorizing Provider   acetaminophen (TYLENOL) 325 MG tablet Take 2 tablets (650 mg total) by mouth every 6 (six) hours as needed (or Fever >/= 101). Patient taking differently: Take 650 mg by mouth every 6 (six) hours as needed for mild pain (or Fever >/= 101).  02/08/12  Yes Kirstin Shepperson, PA-C  allopurinol (ZYLOPRIM) 300 MG tablet Take 300 mg by mouth daily.   Yes Historical Provider, MD  ALPRAZolam Duanne Moron) 1 MG tablet Take 1 mg by mouth at bedtime.  01/09/15  Yes Historical Provider, MD  apixaban (ELIQUIS) 5 MG TABS tablet Take 1 tablet (5 mg total) by mouth 2 (two) times daily. 04/02/15  Yes [name], MD  fish oil-omega-3 fatty acids 1000 MG capsule Take 1 g by mouth daily.   Yes Historical Provider, MD  magnesium oxide (MAG-OX) 400 MG tablet Take 400 mg by mouth daily.   Yes Historical Provider, MD  metoprolol succinate (TOPROL-XL) 50 MG 24 hr tablet Take 1.5 tablets once daily. If pulse drops less than 56 then hold. Patient taking differently: Take 75 mg by mouth daily. Take 1.5 tablets once daily. If pulse drops less than 56 then hold. 04/02/15  Yes [name], MD  Misc Natural Products (BLACK CHERRY CONCENTRATE PO) Take 1 tablet by mouth daily.   Yes Historical Provider, MD  temazepam (RESTORIL) 30 MG capsule Take 30 mg by mouth at bedtime.   Yes Historical Provider, MD  hydrocortisone cream 1 % Apply 1 application topically 3 (three) times daily as needed for itching (skin irritation). Patient not taking: Reported on 04/10/2015 03/04/15   [name], MD   BP 104/63 mmHg  Pulse 95  Temp(Src) 98 F (36.7 C) (Oral)  Resp 19  Ht 5\' 10"  (1.778 m)  Wt 300 lb (136.079 kg)  BMI 43.05 kg/m2  SpO2 95% Physical Exam  Constitutional: He is oriented to person, place, and time. He appears well-developed and well-nourished.  HENT:  Head: Normocephalic and atraumatic.  Eyes: Pupils are equal, round, and reactive to light.  Neck: Normal range of motion. Neck supple.  Cardiovascular: Normal rate,  regular rhythm and normal heart sounds.   Pulmonary/Chest: Effort normal and breath sounds normal. No respiratory distress. He has no wheezes. He has no rales. He exhibits no tenderness.  Abdominal: Soft. Bowel sounds are normal. There is no tenderness. There is no rebound and no guarding.  Musculoskeletal: Normal range of motion. He exhibits no edema.  Lymphadenopathy:    He has no cervical adenopathy.  Neurological: He is alert and oriented to person, place, and time.  Skin: Skin is warm and dry. No rash noted.  Psychiatric: He has a normal mood and affect.    ED Course  Procedures (including critical care time) Labs Review Labs Reviewed  BASIC METABOLIC  PANEL - Abnormal; Notable for the following:    Glucose, Bld 138 (*)    All other components within normal limits  CBC - Abnormal; Notable for the following:    WBC 12.2 (*)    All other components within normal limits  LIPASE, BLOOD  HEPATIC FUNCTION PANEL  I-STAT TROPOININ, ED    Imaging Review Dg Chest 2 View  04/10/2015  CLINICAL DATA:  Central chest pain, nausea, vomiting since this morning. EXAM: CHEST  2 VIEW COMPARISON:  02/21/2015 FINDINGS: Airspace opacity noted in the right lower lobe compatible with pneumonia. Left lung is clear. Heart is borderline in size. No effusions or acute bony abnormality. IMPRESSION: Right lower lobe pneumonia. Electronically Signed   By: Rolm Baptise M.D.   On: 04/10/2015 11:21   I have personally reviewed and evaluated these images and lab results as part of my medical decision-making.   EKG Interpretation   Date/Time:  Thursday April 10 2015 10:07:13 EST Ventricular Rate:  119 PR Interval:    QRS Duration: 108 QT Interval:  308 QTC Calculation: 433 R Axis:   -17 Text Interpretation:  Atrial fibrillation Borderline left axis deviation  Low voltage, precordial leads Abnormal R-wave progression, early  transition Baseline wander in lead(s) V3 V4 Confirmed by Male Iafrate  MD,  Avaley Coop  ([id number]) on 04/10/2015 10:57:40 AM      MDM   Final diagnoses:  CAP (community acquired pneumonia)  Chest pain, unspecified chest pain type  Atrial fibrillation, unspecified type [facility])    Patient presents with chest pain diaphoresis and shortness of breath. His wife does say that he's had a cough for the last few days. He has evidence of pneumonia on x-ray. His troponin is negative. He has no ischemic changes noted on EKG. He is in atrial fibrillation but at a controlled rate. He has no ongoing chest pain. He was started on antibiotics for community-acquired pneumonia. I will consult the hospitalist for admission.  I spoke with Nevin Bloodgood with the hospitalist service who is accepted the patient to a telemetry bed, for admission.  Malvin Johns, MD 04/10/15 1230

## 2015-04-10 NOTE — Telephone Encounter (Signed)
Returned call to patient's wife.She stated when husband woke up this morning he was very cold,chest pain radiating into back,nausea.Stated he took 2 Tylenol and laid back down.Stated he continues to have chest pain radiating into back,nausea.Advised to go to ER.Trish called.

## 2015-04-10 NOTE — Progress Notes (Signed)
RT gave pt a sputum sample cup to collect a sample with. Pt unable to provide a sample at this time. RN notified.

## 2015-04-10 NOTE — ED Notes (Signed)
89% on RA, 2L O2 applied, pt resting.

## 2015-04-10 NOTE — ED Notes (Signed)
Patient transported to X-ray 

## 2015-04-10 NOTE — H&P (Signed)
[facility] History and Physical  Garrett Munoz [id number] DOB: Apr 19, 1946 DOA: 04/10/2015  Referring physician: Emergency Department PCP: Thressa Sheller, MD   CHIEF COMPLAINT:   chest pain, shortness of breath       HPI: Garrett Munoz is a 69 y.o. male with hypertension, atrial fibrillation, morbid obesity and gout. This morning patient developed shortness of breath associated with left-sided chest pain which radiated through to the back. Patient also mentions that the pain radiated down his left leg. After 2 Tylenol the chest pain resolved. Patient recalls having significant chills requiring electric blanket to keep warm. Patient had an episode of nausea and vomiting this a.m and wife describes patient as appearing gray in color when all these symptoms were occuring. Wife describes a dry cough for the last 3-4 days. Patient does state he had pneumonia twice and both times he had chest pain but none of these other symptoms.     Patient also reports significant fatigue since starting [place] was approximately 3 weeks ago. Though not short of breath patient is just to tired to carry out some of his normal activities    ED COURSE:    Labs:    WBC 12.2 Troponin 0.0            CXR:    RLL pna       EKG:    Atrial fibrillation , Vent rate 119  Borderline left axis deviation Low voltage, precordial leads Abnormal R-wave progression, early transition Baseline wander in lead(s) V3 V4 Confirmed by BELFI MD, MELANIE ([id number]) on 04/10/2015 10:57:40 AM               Medications  ondansetron (ZOFRAN) injection 4 mg (0 mg Intravenous Hold 04/10/15 1138)  cefTRIAXone (ROCEPHIN) 1 g in dextrose 5 % 50 mL IVPB (1 g Intravenous New Bag/Given 04/10/15 1212)  azithromycin (ZITHROMAX) 500 mg in dextrose 5 % 250 mL IVPB (not administered)   Review of Systems  Constitutional: Positive for chills and malaise/fatigue.  HENT: Negative.   Eyes: Negative.   Respiratory: Positive for cough  and shortness of breath.   Cardiovascular: Positive for chest pain.  Gastrointestinal: Positive for nausea and vomiting.  Genitourinary: Negative.   Musculoskeletal: Negative.   Skin: Negative.   Neurological: Negative.   Endo/Heme/Allergies: Negative.   Psychiatric/Behavioral: Negative.    Past Medical History  Diagnosis Date  . Gout   . Cataract     right eye  . Eye worm     right eye  . S/P total knee replacement     right  . DDD (degenerative disc disease), lumbosacral   . Radicular syndrome of left leg   . Left knee DJD   . Bronchitis     hx  . Pneumonia     hx  . Sleep apnea     can't use sleep study 10 yrs ago  . Dysrhythmia     irregular heatbeat  . 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    Past Surgical History  Procedure Laterality Date  . Retinal detachment surgery  03/2011    rt  . Eye examination under anesthesia w/ retinal cryotherapy and retinal laser  07/2011  . Cholecystectomy    . Knee arthroscopy      bilateral  . Total knee arthroplasty  2003    rt  . Eye surgery  02/2011    cat rt  . Total knee arthroplasty  02/07/2012    Procedure: TOTAL KNEE ARTHROPLASTY;  Surgeon: Herbie Baltimore  Venetia Maxon, MD;  Location: [place];  Service: Orthopedics;  Laterality: Left;  . Cardioversion N/A 03/04/2015    Procedure: CARDIOVERSION;  Surgeon: [name], MD;  Location: [place];  Service: Cardiovascular;  Laterality: N/A;    SOCIAL HISTORY:  reports that he quit smoking about 23 years ago. His smoking use included Cigarettes. He has a 30 pack-year smoking history. He does not have any smokeless tobacco history on file. He reports that he drinks alcohol. He reports that he does not use illicit drugs. Lives: at home with wife   Assistive devices:   None needed for ambulation.   No Known Allergies  Family History  Problem Relation Age of Onset  . Lung cancer Father   . Lung cancer Sister     Prior to Admission medications   Medication Sig Start Date End Date Taking?  Authorizing Provider  acetaminophen (TYLENOL) 325 MG tablet Take 2 tablets (650 mg total) by mouth every 6 (six) hours as needed (or Fever >/= 101). Patient taking differently: Take 650 mg by mouth every 6 (six) hours as needed for mild pain (or Fever >/= 101).  02/08/12  Yes Kirstin Shepperson, PA-C  allopurinol (ZYLOPRIM) 300 MG tablet Take 300 mg by mouth daily.   Yes Historical Provider, MD  ALPRAZolam Duanne Moron) 1 MG tablet Take 1 mg by mouth at bedtime.  01/09/15  Yes Historical Provider, MD  apixaban (ELIQUIS) 5 MG TABS tablet Take 1 tablet (5 mg total) by mouth 2 (two) times daily. 04/02/15  Yes [name], MD  fish oil-omega-3 fatty acids 1000 MG capsule Take 1 g by mouth daily.   Yes Historical Provider, MD  magnesium oxide (MAG-OX) 400 MG tablet Take 400 mg by mouth daily.   Yes Historical Provider, MD  metoprolol succinate (TOPROL-XL) 50 MG 24 hr tablet Take 1.5 tablets once daily. If pulse drops less than 56 then hold. Patient taking differently: Take 75 mg by mouth daily. Take 1.5 tablets once daily. If pulse drops less than 56 then hold. 04/02/15  Yes [name], MD  Misc Natural Products (BLACK CHERRY CONCENTRATE PO) Take 1 tablet by mouth daily.   Yes Historical Provider, MD  temazepam (RESTORIL) 30 MG capsule Take 30 mg by mouth at bedtime.   Yes Historical Provider, MD  hydrocortisone cream 1 % Apply 1 application topically 3 (three) times daily as needed for itching (skin irritation). Patient not taking: Reported on 04/10/2015 03/04/15   [name], MD   PHYSICAL EXAM: Filed Vitals:   04/10/15 1148 04/10/15 1154 04/10/15 1200 04/10/15 1215  BP: 104/63  114/84 107/74  Pulse: 87 95 108 103  Temp:      TempSrc:      Resp: 20 19 23 20   Height:      Weight:      SpO2: 88% 95% 95% 95%    Wt Readings from Last 3 Encounters:  04/10/15 136.079 kg (300 lb)  03/18/15 137.077 kg (302 lb 3.2 oz)  03/04/15 135.172 kg (298 lb)    General:  Pleasant obese white male.  Appears calm and comfortable Eyes: PER, normal lids, irises & conjunctiva ENT: grossly normal hearing, lips & tongue Neck: no LAD, no masses Cardiovascular: RRR, no murmurs. No LE edema.  Respiratory: Nasal cannula at 2 L/m Respirations even and unlabored. Decreased breath sounds in bilateral lower lobes, right greater than left . No wheezes heard..   Abdomen: soft, obese,, non-tender, active bowel sounds. No obvious masses.  Skin: no rash seen on limited exam Musculoskeletal: grossly normal tone BUE/BLE Psychiatric: grossly normal mood and affect, speech fluent and appropriate Neurologic: grossly non-focal.         LABS ON ADMISSION:    Basic Metabolic Panel:  Recent Labs Lab 04/10/15 1005  NA 140  K 4.7  CL 103  CO2 27  GLUCOSE 138*  BUN 8  CREATININE 1.03  CALCIUM 9.2   Liver Function Tests:  Recent Labs Lab 04/10/15 1005  AST 29  ALT 30  ALKPHOS 64  BILITOT 0.8  PROT 6.5  ALBUMIN 3.6    Recent Labs Lab 04/10/15 1005  LIPASE 43    CBC:  Recent Labs Lab 04/10/15 1005  WBC 12.2*  HGB 15.8  HCT 47.7  MCV 95.2  PLT 166    CREATININE: 1.03 (04/10/15 1005) Estimated creatinine clearance - 91.4 mL/min  Radiological Exams on Admission: Dg Chest 2 View  04/10/2015  CLINICAL DATA:  Central chest pain, nausea, vomiting since this morning. EXAM: CHEST  2 VIEW COMPARISON:  02/21/2015 FINDINGS: Airspace opacity noted in the right lower lobe compatible with pneumonia. Left lung is clear. Heart is borderline in size. No effusions or acute bony abnormality. IMPRESSION: Right lower lobe pneumonia. Electronically Signed   By: Rolm Baptise M.D.   On: 04/10/2015 11:21   Echo Nov 2016 EF 55-60% Wall motion normal, no regional wall abnormalities.  Nuclear stress late October was low risk without ischemia .   ASSESSMENT / PLAN   Chest pain, diaphoresis, isolated episode of nausea and vomiting with findings of community acquired pneumonia on CXR.  -Admit to  telemetry -IV antibiotics -Supplemental oxygen - Symptoms all likely secondary to CAP but other etiologies need to be considered as well ( cardiac? PE?).   Will cycle troponins (first one normal) and place on telemetry. No acute findings on EKG  Atrial fibrillation. Heart rate 119, s/p recent cardioversion. ChadsVasc score 2 -Monitor on telemetry -Continue beta blocker and Eliquis   Fatigue. Etiology unclear but could be secondary to sleep apnea vrs cardiac in nature vrs other. .Patient will let with initiation of Eliquis three weeks ago.   OSA (obstructive sleep apnea). Untreated.    Hypertension, controlled. Only on a beta blocker at present  Anxiety.  -Continue home xanax  CONSULTANTS:   none  Code Status: DNR DVT Prophylaxis: On Eliquis Family Communication:  Patient alert, oriented and understands plan of care.   Disposition Plan: Discharge to home in 3-4 days   Time spent: 60 minutes Tye Savoy  NP [facility] Pager [phone]

## 2015-04-11 LAB — TROPONIN I

## 2015-04-11 LAB — HIV ANTIBODY (ROUTINE TESTING W REFLEX): HIV SCREEN 4TH GENERATION: NONREACTIVE

## 2015-04-11 MED ORDER — LEVOFLOXACIN 500 MG PO TABS: 500.0000 mg | ORAL_TABLET | Freq: Every day | ORAL | Status: DC

## 2015-04-11 NOTE — Discharge Summary (Signed)
Physician Discharge Summary  Garrett Munoz MRN: [id number] DOB/AGE: 07/27/1945 69 y.o.  PCP: Thressa Sheller, MD   Admit date: 04/10/2015 Discharge date: 04/11/2015  Discharge Diagnoses:     Principal Problem:   CAP (community acquired pneumonia) Active Problems:   OSA (obstructive sleep apnea)   Atrial fibrillation (HCC)   Fatigue   Hypertension   Pneumonia   HCAP (healthcare-associated pneumonia)    Follow-up recommendations Follow-up with PCP in 3-5 days , including all  additional recommended appointments as below Follow-up CBC, CMP in 3-5 days Patient left against medical advise, despite being notified that the patient needs to stay another 48 hours , for the blood cultures to result, patient and wife declined to stay another day     Medication List    TAKE these medications        acetaminophen 325 MG tablet  Commonly known as:  TYLENOL  Take 2 tablets (650 mg total) by mouth every 6 (six) hours as needed (or Fever >/= 101).     allopurinol 300 MG tablet  Commonly known as:  ZYLOPRIM  Take 300 mg by mouth daily.     ALPRAZolam 1 MG tablet  Commonly known as:  XANAX  Take 1 mg by mouth at bedtime.     apixaban 5 MG Tabs tablet  Commonly known as:  ELIQUIS  Take 1 tablet (5 mg total) by mouth 2 (two) times daily.     BLACK CHERRY CONCENTRATE PO  Take 1 tablet by mouth daily.     fish oil-omega-3 fatty acids 1000 MG capsule  Take 1 g by mouth daily.     hydrocortisone cream 1 %  Apply 1 application topically 3 (three) times daily as needed for itching (skin irritation).     levofloxacin 500 MG tablet  Commonly known as:  LEVAQUIN  Take 1 tablet (500 mg total) by mouth daily.     magnesium oxide 400 MG tablet  Commonly known as:  MAG-OX  Take 400 mg by mouth daily.     metoprolol succinate 50 MG 24 hr tablet  Commonly known as:  TOPROL-XL  Take 1.5 tablets once daily. If pulse drops less than 56 then hold.     temazepam 30 MG capsule   Commonly known as:  RESTORIL  Take 30 mg by mouth at bedtime.         Discharge Condition: Signed out [facility] Discharge Instructions     No Known Allergies    Disposition: 07-Left Against Medical Advice   Consults: None  Significant Diagnostic Studies:  Dg Chest 2 View  04/10/2015  CLINICAL DATA:  Central chest pain, nausea, vomiting since this morning. EXAM: CHEST  2 VIEW COMPARISON:  02/21/2015 FINDINGS: Airspace opacity noted in the right lower lobe compatible with pneumonia. Left lung is clear. Heart is borderline in size. No effusions or acute bony abnormality. IMPRESSION: Right lower lobe pneumonia. Electronically Signed   By: Rolm Baptise M.D.   On: 04/10/2015 11:21        Filed Weights   04/10/15 1008 04/10/15 1411  Weight: 136.079 kg (300 lb) 137.9 kg (304 lb 0.2 oz)     Microbiology: Recent Results (from the past 240 hour(s))  Culture, blood (routine x 2) Call MD if unable to obtain prior to antibiotics being given     Status: None (Preliminary result)   Collection Time: 04/10/15  3:15 PM  Result Value Ref Range Status   Specimen Description BLOOD LEFT ARM  Final  Special Requests IN PEDIATRIC BOTTLE 2CC  Final   Culture NO GROWTH < 24 HOURS  Final   Report Status PENDING  Incomplete  Culture, blood (routine x 2) Call MD if unable to obtain prior to antibiotics being given     Status: None (Preliminary result)   Collection Time: 04/10/15  3:30 PM  Result Value Ref Range Status   Specimen Description BLOOD LEFT HAND  Final   Special Requests IN PEDIATRIC BOTTLE 3CC  Final   Culture NO GROWTH < 24 HOURS  Final   Report Status PENDING  Incomplete  Culture, sputum-assessment     Status: None   Collection Time: 04/10/15  8:12 PM  Result Value Ref Range Status   Specimen Description SPUTUM  Final   Special Requests NONE  Final   Sputum evaluation   Final    THIS SPECIMEN IS ACCEPTABLE. RESPIRATORY CULTURE REPORT TO FOLLOW.   Report  Status 04/10/2015 FINAL  Final  Culture, respiratory (NON-Expectorated)     Status: None (Preliminary result)   Collection Time: 04/10/15  8:12 PM  Result Value Ref Range Status   Specimen Description SPUTUM  Final   Special Requests NONE  Final   Gram Stain   Final    ABUNDANT WBC PRESENT, PREDOMINANTLY PMN RARE SQUAMOUS EPITHELIAL CELLS PRESENT RARE GRAM POSITIVE COCCI IN PAIRS RARE GRAM NEGATIVE RODS Performed at [facility]    Culture   Final    NO GROWTH 1 DAY Performed at [facility]    Report Status PENDING  Incomplete       Blood Culture    Component Value Date/Time   SDES SPUTUM 04/10/2015 2012   SDES SPUTUM 04/10/2015 2012   [place] NONE 04/10/2015 2012   [place] NONE 04/10/2015 2012   CULT  04/10/2015 2012    NO GROWTH 1 DAY Performed at [place] 04/10/2015 FINAL 04/10/2015 2012   REPTSTATUS PENDING 04/10/2015 2012      Labs: Results for orders placed or performed during the hospital encounter of 04/10/15 (from the past 48 hour(s))  Basic metabolic panel     Status: Abnormal   Collection Time: 04/10/15 10:05 AM  Result Value Ref Range   Sodium 140 135 - 145 mmol/L   Potassium 4.7 3.5 - 5.1 mmol/L   Chloride 103 101 - 111 mmol/L   CO2 27 22 - 32 mmol/L   Glucose, Bld 138 (H) 65 - 99 mg/dL   BUN 8 6 - 20 mg/dL   Creatinine, Ser 1.03 0.61 - 1.24 mg/dL   Calcium 9.2 8.9 - 10.3 mg/dL   GFR calc non Af Amer >60 >60 mL/min   GFR calc Af Amer >60 >60 mL/min    Comment: (NOTE) The eGFR has been calculated using the CKD EPI equation. This calculation has not been validated in all clinical situations. eGFR's persistently <60 mL/min signify possible Chronic Kidney Disease.    Anion gap 10 5 - 15  CBC     Status: Abnormal   Collection Time: 04/10/15 10:05 AM  Result Value Ref Range   WBC 12.2 (H) 4.0 - 10.5 K/uL   RBC 5.01 4.22 - 5.81 MIL/uL   Hemoglobin 15.8 13.0 - 17.0 g/dL   HCT 47.7 39.0 - 52.0 %    MCV 95.2 78.0 - 100.0 fL   MCH 31.5 26.0 - 34.0 pg   MCHC 33.1 30.0 - 36.0 g/dL   RDW 13.4 11.5 - 15.5 %   Platelets  166 150 - 400 K/uL  Lipase, blood     Status: None   Collection Time: 04/10/15 10:05 AM  Result Value Ref Range   Lipase 43 11 - 51 U/L  Hepatic function panel     Status: None   Collection Time: 04/10/15 10:05 AM  Result Value Ref Range   Total Protein 6.5 6.5 - 8.1 g/dL   Albumin 3.6 3.5 - 5.0 g/dL   AST 29 15 - 41 U/L   ALT 30 17 - 63 U/L   Alkaline Phosphatase 64 38 - 126 U/L   Total Bilirubin 0.8 0.3 - 1.2 mg/dL   Bilirubin, Direct 0.2 0.1 - 0.5 mg/dL   Indirect Bilirubin 0.6 0.3 - 0.9 mg/dL  I-stat troponin, ED (not at [facility])     Status: None   Collection Time: 04/10/15 10:58 AM  Result Value Ref Range   Troponin i, poc 0.00 0.00 - 0.08 ng/mL   Comment 3            Comment: Due to the release kinetics of cTnI, a negative result within the first hours of the onset of symptoms does not rule out myocardial infarction with certainty. If myocardial infarction is still suspected, repeat the test at appropriate intervals.   HIV antibody     Status: None   Collection Time: 04/10/15  3:15 PM  Result Value Ref Range   HIV Screen 4th Generation wRfx Non Reactive Non Reactive    Comment: (NOTE) Performed At: [facility] [address] [place], [place] [id number] Lindon Romp MD PC:[id number]   Culture, blood (routine x 2) Call MD if unable to obtain prior to antibiotics being given     Status: None (Preliminary result)   Collection Time: 04/10/15  3:15 PM  Result Value Ref Range   Specimen Description BLOOD LEFT ARM    Special Requests IN PEDIATRIC BOTTLE 2CC    Culture NO GROWTH < 24 HOURS    Report Status PENDING   Troponin I     Status: None   Collection Time: 04/10/15  3:15 PM  Result Value Ref Range   Troponin I 0.03 <0.031 ng/mL    Comment:        NO INDICATION OF MYOCARDIAL INJURY.   Culture, blood (routine x 2) Call MD if  unable to obtain prior to antibiotics being given     Status: None (Preliminary result)   Collection Time: 04/10/15  3:30 PM  Result Value Ref Range   Specimen Description BLOOD LEFT HAND    Special Requests IN PEDIATRIC BOTTLE 3CC    Culture NO GROWTH < 24 HOURS    Report Status PENDING   Culture, sputum-assessment     Status: None   Collection Time: 04/10/15  8:12 PM  Result Value Ref Range   Specimen Description SPUTUM    Special Requests NONE    Sputum evaluation      THIS SPECIMEN IS ACCEPTABLE. RESPIRATORY CULTURE REPORT TO FOLLOW.   Report Status 04/10/2015 FINAL   Culture, respiratory (NON-Expectorated)     Status: None (Preliminary result)   Collection Time: 04/10/15  8:12 PM  Result Value Ref Range   Specimen Description SPUTUM    Special Requests NONE    Gram Stain      ABUNDANT WBC PRESENT, PREDOMINANTLY PMN RARE SQUAMOUS EPITHELIAL CELLS PRESENT RARE GRAM POSITIVE COCCI IN PAIRS RARE GRAM NEGATIVE RODS Performed at [facility]    Culture      NO GROWTH  1 DAY Performed at [facility]    Report Status PENDING   Troponin I     Status: None   Collection Time: 04/10/15  8:41 PM  Result Value Ref Range   Troponin I <0.03 <0.031 ng/mL    Comment:        NO INDICATION OF MYOCARDIAL INJURY.   TSH     Status: None   Collection Time: 04/10/15  8:41 PM  Result Value Ref Range   TSH 1.300 0.350 - 4.500 uIU/mL  Strep pneumoniae urinary antigen     Status: None   Collection Time: 04/10/15  9:06 PM  Result Value Ref Range   Strep Pneumo Urinary Antigen NEGATIVE NEGATIVE    Comment:        Infection due to S. pneumoniae cannot be absolutely ruled out since the antigen present may be below the detection limit of the test.   Troponin I     Status: None   Collection Time: 04/11/15  2:22 AM  Result Value Ref Range   Troponin I <0.03 <0.031 ng/mL    Comment:        NO INDICATION OF MYOCARDIAL INJURY.      Lipid Panel     Component Value  Date/Time   CHOL 143 01/30/2015 0920   TRIG 136 01/30/2015 0920   HDL 51 01/30/2015 0920   CHOLHDL 2.8 01/30/2015 0920   VLDL 27 01/30/2015 0920   LDLCALC 65 01/30/2015 0920     No results found for: HGBA1C   Lab Results  Component Value Date   LDLCALC 65 01/30/2015   CREATININE 1.03 04/10/2015     HPI :69 y.o. male with hypertension, atrial fibrillation, morbid obesity and gout. This morning patient developed shortness of breath associated with left-sided chest pain which radiated through to the back. Patient also mentions that the pain radiated down his left leg. After 2 Tylenol the chest pain resolved. Patient recalls having significant chills requiring electric blanket to keep warm. Patient had an episode of nausea and vomiting this a.m and wife describes patient as appearing gray in color when all these symptoms were occuring. Wife describes a dry cough for the last 3-4 days. Patient does state he had pneumonia twice and both times he had chest pain but none of these other symptoms  HOSPITAL COURSE:   Healthcare associated pneumonia Treated with IV vancomycin and cefepime. Refused to stay, for blood cultures to result Patient left AGAINST MEDICAL ADVICE, as a courtesy a prescription for Levaquin was provided for 7 days   Left-sided chest pain Appears to be pleuritic associated with pneumonia. EKG shows A. fib with no ST-T changes. Initial troponin negative. Cardiac enzymes, telemetry negative  A. fib with RVR Likely secondary to pneumonia. Continue eliquis. Continue Toprol. Continues to be in atrial fibrillation controlled rate  Generalized weakness Patient ambulated and walked out of the hospital  Discharge Exam:    Blood pressure 107/70, pulse 88, temperature 98.3 F (36.8 C), temperature source Oral, resp. rate 18, height '5\' 10"'  (1.778 m), weight 137.9 kg (304 lb 0.2 oz), SpO2 93 %.  Refused examination      Signed: Rajohn Henery 04/11/2015, 10:57 AM         Time spent >45 mins

## 2015-04-11 NOTE — Progress Notes (Signed)
Pt has decided to leave AMA. Pt stated that he could not rest here, he got no sleep last night, pt felt like he needed to go home. MD has cleared pt to leave AMA. Pt and family has been educated on leaving AMA and About the financial costs that come along with it. Pt. Has signed the AMA form and agreed to discharged. Prescriptions have been given to pt. Telemetry has been discontinued.

## 2015-04-11 NOTE — Progress Notes (Signed)
PT Cancellation Note  Patient Details Name: Garrett Munoz MRN: [id number] DOB: 1945-06-09   Cancelled Treatment:    Reason Eval/Treat Not Completed: PT screened, no needs identified, will sign off Pt reports feeling like himself and does not need any PT services. Reports getting up and walking without difficulty and ready to go home. Screened and declines PT. Will sign off for now. Thanks   Myisha Pickerel A Chayden Garrelts 04/11/2015, 9:26 AM Wray Kearns, PT, DPT [phone]

## 2015-04-13 LAB — CULTURE, RESPIRATORY W GRAM STAIN

## 2015-04-13 LAB — CULTURE, RESPIRATORY

## 2015-04-15 LAB — CULTURE, BLOOD (ROUTINE X 2)
Culture: NO GROWTH
Culture: NO GROWTH

## 2015-04-17 NOTE — Telephone Encounter (Signed)
acknowledged

## 2015-04-22 DIAGNOSIS — F17211 Nicotine dependence, cigarettes, in remission: Secondary | ICD-10-CM | POA: Diagnosis not present

## 2015-04-22 DIAGNOSIS — N182 Chronic kidney disease, stage 2 (mild): Secondary | ICD-10-CM | POA: Diagnosis not present

## 2015-04-22 DIAGNOSIS — I4891 Unspecified atrial fibrillation: Secondary | ICD-10-CM | POA: Diagnosis not present

## 2015-04-22 DIAGNOSIS — R7309 Other abnormal glucose: Secondary | ICD-10-CM | POA: Diagnosis not present

## 2015-04-22 DIAGNOSIS — Z23 Encounter for immunization: Secondary | ICD-10-CM | POA: Diagnosis not present

## 2015-04-22 DIAGNOSIS — M109 Gout, unspecified: Secondary | ICD-10-CM | POA: Diagnosis not present

## 2015-04-22 MED FILL — CARVEDILOL 6.25 MG TABLET: 6.25 | 90 days supply | Qty: 180 | Fill #0

## 2015-05-06 DIAGNOSIS — M19049 Primary osteoarthritis, unspecified hand: Secondary | ICD-10-CM | POA: Diagnosis not present

## 2015-05-06 DIAGNOSIS — I4891 Unspecified atrial fibrillation: Secondary | ICD-10-CM | POA: Diagnosis not present

## 2015-06-23 ENCOUNTER — Encounter: Payer: Self-pay | Admitting: Cardiovascular Disease

## 2015-06-23 ENCOUNTER — Ambulatory Visit (INDEPENDENT_AMBULATORY_CARE_PROVIDER_SITE_OTHER): Payer: PPO | Admitting: Cardiovascular Disease

## 2015-06-23 VITALS — BP 140/100 | HR 83 | Ht 70.0 in | Wt 302.0 lb

## 2015-06-23 DIAGNOSIS — I481 Persistent atrial fibrillation: Secondary | ICD-10-CM | POA: Diagnosis not present

## 2015-06-23 DIAGNOSIS — R5383 Other fatigue: Secondary | ICD-10-CM

## 2015-06-23 DIAGNOSIS — Z7901 Long term (current) use of anticoagulants: Secondary | ICD-10-CM | POA: Diagnosis not present

## 2015-06-23 DIAGNOSIS — G4733 Obstructive sleep apnea (adult) (pediatric): Secondary | ICD-10-CM

## 2015-06-23 DIAGNOSIS — I4819 Other persistent atrial fibrillation: Secondary | ICD-10-CM

## 2015-06-23 NOTE — Patient Instructions (Signed)
Your physician recommends that you schedule a follow-up appointment and bloodwork in 6 months or sooner if needed.  If you need a refill on your cardiac medications before your next appointment, please call your pharmacy.

## 2015-06-25 ENCOUNTER — Other Ambulatory Visit: Payer: Self-pay | Admitting: *Deleted

## 2015-06-25 ENCOUNTER — Encounter: Payer: Self-pay | Admitting: Cardiovascular Disease

## 2015-06-25 DIAGNOSIS — I4891 Unspecified atrial fibrillation: Secondary | ICD-10-CM

## 2015-06-25 DIAGNOSIS — R5383 Other fatigue: Secondary | ICD-10-CM

## 2015-06-25 DIAGNOSIS — Z13220 Encounter for screening for lipoid disorders: Secondary | ICD-10-CM

## 2015-06-25 DIAGNOSIS — I10 Essential (primary) hypertension: Secondary | ICD-10-CM

## 2015-06-25 NOTE — Progress Notes (Signed)
Patient ID: Garrett Munoz, male   DOB: 07-10-1945, 70 y.o.   MRN: [id number]     HPI: Garrett Munoz is a 70 y.o. male who presents to the office today for follow-up evaluation of  atrial fibrillation.  Mr. Gorrell has a history of hypertension, and remotely had taken blood pressure medications for several years but none since the last 20 years. He has a history of prior hypertension, obstructive sleep apnea, untreated due to previous intolerance to full face mask, as well as obesity.  He admits to having an intermittent irregular rhythm.  Last autumn he was scheduled to undergo elective surgery on his right foot hammertoe by Dr. Fritzi Mandes.  Surgery was canceled due to concerns for possible Mobitz type II block with possible atrial flutter.  He was seen by me for preoperative evaluation on 01/30/2015.  At that time, his ECG demonstrated atrial fibrillation with ventricular rate at approximately 100 bpm.  He was noted have small Q wave in lead 3.  He was started on Toprol 25 mg for 4 days and this was titrated up to 50 mg.  He also was started on eloquence 5 mg twice a day for anticoagulation.  A 2-D echo Doppler study on February 18 2015 revealed an ejection fraction of 55-60%.  There was moderate left ventricular hypertrophy.  The left atrium was moderately dilated.  A nuclear perfusion study done on 02/13/2015 was low risk without ischemia with only a mild apical defect, probably attenuation. He denies any chest pain.  He denies shortness of breath.  He is been unable to walk secondary to his toe abnormality.  He admits to daytime sleepiness and snoring.  He had not utilized CPAP therapy in years.  He is status post cataract surgery with lens implant.    Since I I last saw him,  He underwent cardioversion on , no murmur 15 2016 and was successfully converted back to sinus rhythm.  On 04/10/2015.  He was admitted with community-acquired pneumonia and was found to be back in atrial fibrillation.  He denies  any episodes of chest pain. He has been on eliquis 5 mg twice a day for anticoagulation , carvedilol 6.25 mg twice a day.  He is unaware of his rhythm being abnormal. He is not sleeping well but has not been using his CPAP. He feels fatigued. He presents for reevaluation.   Past Medical History  Diagnosis Date  . Gout   . Cataract     right eye  . Eye worm     right eye  . S/P total knee replacement     right  . DDD (degenerative disc disease), lumbosacral   . Radicular syndrome of left leg   . Left knee DJD   . Bronchitis     hx  . Pneumonia     hx  . Sleep apnea     can't use sleep study 10 yrs ago  . Dysrhythmia     irregular heatbeat  . 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 Retinal detachment surgery  03/2011    rt  . Eye examination under anesthesia w/ retinal cryotherapy and retinal laser  07/2011  . Cholecystectomy    . Knee arthroscopy      bilateral  . Total knee arthroplasty  2003    rt  . Eye surgery  02/2011    cat rt  . Total knee arthroplasty  02/07/2012  Procedure: TOTAL KNEE ARTHROPLASTY;  Surgeon: [name], MD;  Location: [facility];  Service: Orthopedics;  Laterality: Left;  . Cardioversion N/A 03/04/2015    Procedure: CARDIOVERSION;  Surgeon: [name], MD;  Location: [facility] ENDOSCOPY;  Service: Cardiovascular;  Laterality: N/A;    No Known Allergies  Current Outpatient Prescriptions  Medication Sig Dispense Refill  . acetaminophen (TYLENOL) 325 MG tablet Take 2 tablets (650 mg total) by mouth every 6 (six) hours as needed (or Fever >/= 101). (Patient taking differently: Take 650 mg by mouth every 6 (six) hours as needed for mild pain (or Fever >/= 101). )    . allopurinol (ZYLOPRIM) 300 MG tablet Take 300 mg by mouth daily.    [name] ALPRAZolam (XANAX) 1 MG tablet Take 1 mg by mouth at bedtime.   2  . apixaban (ELIQUIS) 5 MG TABS tablet Take 1 tablet (5 mg total) by mouth 2 (two) times daily. 180  tablet 1  . fish oil-omega-3 fatty acids 1000 MG capsule Take 1 g by mouth daily.    . magnesium oxide (MAG-OX) 400 MG tablet Take 400 mg by mouth daily.    . Misc Natural Products (BLACK CHERRY CONCENTRATE PO) Take 1 tablet by mouth daily.    . temazepam (RESTORIL) 30 MG capsule Take 30 mg by mouth at bedtime.    . carvedilol (COREG) 6.25 MG tablet 6.25 mg 2 (two) times daily.  2   No current facility-administered medications for this visit.    Social History   Social History  . Marital Status: Married    Spouse Name: N/A  . Number of Children: N/A  . Years of Education: N/A   Occupational History  . Not on file.   Social History Main Topics  . Smoking status: Former Smoker -- 2.00 packs/day for 15 years    Types: Cigarettes    Quit date: 02/02/1992  . Smokeless tobacco: Not on file     Comment: occ alcohol  . Alcohol Use: 0.0 oz/week    0 Standard drinks or equivalent per week     Comment: occasional  . Drug Use: No  . Sexual Activity: Not on file   Other Topics Concern  . 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  He is married.   Family History  Problem Relation Age of Onset  . Lung cancer Father   . Lung cancer Sister    Family history is notable that his father died at 82 with lung CA and his mother died at 38.  She had an irregular heart rhythm.  He has 2 sisters and one is deceased secondary to cancer and 1 brother.  ROS General: Negative; No fevers, chills, or night sweats HEENT: He has a right eye lens  implant after cataract surgery No changes in vision or hearing, sinus congestion, difficulty swallowing Pulmonary: Negative; No cough, wheezing, shortness of breath, hemoptysis Cardiovascular: See HPI:  GI: Negative; No nausea, vomiting, diarrhea, or abdominal pain GU: Negative; No dysuria, hematuria, or difficulty voiding Musculoskeletal: Negative; no myalgias, joint  pain, or weakness Hematologic: Negative; no easy bruising, bleeding Endocrine: Negative; no heat/cold intolerance; no diabetes, Neuro: Negative; no changes in balance, headaches Skin: Negative; No rashes or skin lesions Psychiatric: Negative; No behavioral problems, depression Sleep: Positive for untreated sleep apnea; positive for snoring and hypersomnolence. Other comprehensive 14 point system review is negative  Physical Exam BP 140/100 mmHg  Pulse 83  Ht '5\' 10"'  (1.778 m)  Wt 302 lb (136.986 kg)  BMI 43.33 kg/m2  Repeat blood pressure 128/88  Wt Readings from Last 3 Encounters:  06/23/15 302 lb (136.986 kg)  04/10/15 304 lb 0.2 oz (137.9 kg)  03/18/15 302 lb 3.2 oz (137.077 kg)   General: Alert, oriented, no distress.  Skin: normal turgor, no rashes, warm and dry HEENT: Normocephalic, atraumatic. Pupils equal round and reactive to light; sclera anicteric; extraocular muscles intact, No lid lag; Nose without nasal septal hypertrophy; Mouth/Parynx benign; Mallinpatti scale 3 Neck: No JVD, no carotid bruits; normal carotid upstroke Lungs: clear to ausculatation and percussion bilaterally; no wheezing or rales, normal inspiratory and expiratory effort Chest wall: without tenderness to palpitation Heart: Irregularly irregular rhythm with a rate at approximately 80s, s1 s2 normal, 1/6 systolic murmur, No diastolic murmur, no rubs, gallops, thrills, or heaves Abdomen: Moderate central adiposity soft, nontender; no hepatosplenomehaly, BS+; abdominal aorta nontender and not dilated by palpation. Back: no CVA tenderness Pulses: 2+  Musculoskeletal: full range of motion, normal strength, no joint deformities Extremities: Pulses 2+, no clubbing cyanosis or edema, Homan's sign negative  Neurologic: grossly nonfocal; Cranial nerves grossly wnl Psychologic: Normal mood and affect  ECG (independently read by me):  Atrial fibrillation at 83 bpm.  02/21/2015 ECG (independently read by me):  Atrial fibrillation at 99 bpm.  01/30/2015 ECG (independently read by me): Atrial fibrillation at a approximately 90-100 bpm.  Small Q wave in lead 3.  Early transition.  LABS:  BMP Latest Ref Rng 04/10/2015 02/25/2015 01/30/2015  Glucose 65 - 99 mg/dL 138(H) 111(H) 96  BUN 6 - 20 mg/dL '8 14 15  ' Creatinine 0.61 - 1.24 mg/dL 1.03 0.90 0.97  Sodium 135 - 145 mmol/L 140 141 142  Potassium 3.5 - 5.1 mmol/L 4.7 4.4 4.8  Chloride 101 - 111 mmol/L 103 106 104  CO2 22 - 32 mmol/L '27 24 29  ' Calcium 8.9 - 10.3 mg/dL 9.2 9.1 9.5    Hepatic Function Latest Ref Rng 04/10/2015 01/30/2015 02/02/2012  Total Protein 6.5 - 8.1 g/dL 6.5 6.7 7.9  Albumin 3.5 - 5.0 g/dL 3.6 4.0 4.1  AST 15 - 41 U/L '29 18 21  ' ALT 17 - 63 U/L '30 18 20  ' Alk Phosphatase 38 - 126 U/L 64 60 76  Total Bilirubin 0.3 - 1.2 mg/dL 0.8 0.4 0.4  Bilirubin, Direct 0.1 - 0.5 mg/dL 0.2 - -    CBC Latest Ref Rng 04/10/2015 02/25/2015 01/30/2015  WBC 4.0 - 10.5 K/uL 12.2(H) 6.7 6.6  Hemoglobin 13.0 - 17.0 g/dL 15.8 16.4 16.6  Hematocrit 39.0 - 52.0 % 47.7 46.3 47.2  Platelets 150 - 400 K/uL 166 196 196   Lab Results  Component Value Date   MCV 95.2 04/10/2015   MCV 91.1 02/25/2015   MCV 92.9 01/30/2015    Lab Results  Component Value Date   TSH 1.300 04/10/2015    BNP No results found for: BNP  ProBNP No results found for: PROBNP   Lipid Panel     Component Value Date/Time   CHOL 143 01/30/2015 0920   TRIG 136 01/30/2015 0920   HDL 51 01/30/2015 0920   CHOLHDL 2.8 01/30/2015 0920   VLDL 27 01/30/2015 0920   LDLCALC 65 01/30/2015 0920     RADIOLOGY: No results found.    ASSESSMENT AND PLAN: Mr. Keyston Ardolino is a 70 year old gentleman who has a history of morbid obesity with  a body mass index of 43.33, a remote history of hypertension and a history of obstructive sleep apnea, untreated for many years.  When I saw him  In November 2016, his ECG revealed atrial fibrillation of questionable duration.  I  reviewed his echo Doppler dated nuclear study with him.  He was on Eliquis anticoagulation and subsequently underwent successful cardioversion with restoration of sinus rhythm. In retrospect, he does not recall that he felt any different being in sinus rhythm. He developed an episode of pneumonia and was found to be back in atrial fibrillation in December. I discussed consideration of antiarrhythmic therapy rather than just rate suppression medication and discussed risk, benefits of treatment including potential repeat cardioversion on  arrhythmic therapy.  After much discussion, he would prefer not to do this.  We also discussed potential referral for EP evaluation. He feels that he is stable.  I again discussed the importance of treatment of his sleep apnea with reference to his atrial fibrillation.  With his desire not to undergo consideration for ablation or initiation of antiarrhythmic therapy, we will transition him to permanent atrial fibrillation with rate control with continued anticoagulation therapy.  He will contact us if additional heart rate control is necessary.  I will see him in 6 months for reevaluation.  Time spent: 25 minutes  [name], MD, [facility]  06/25/2015 12:35 PM

## 2015-07-07 MED FILL — TEMAZEPAM 30 MG CAPSULE: 30 | 30 days supply | Qty: 60 | Fill #0

## 2015-07-07 MED FILL — ELIQUIS 5 MG TABLET: 5 | 30 days supply | Qty: 60 | Fill #2

## 2015-07-07 MED FILL — CARVEDILOL 6.25 MG TABLET: 6.25 | 30 days supply | Qty: 60 | Fill #0

## 2015-07-07 MED FILL — ALLOPURINOL 300 MG TABLET: 300 | 90 days supply | Qty: 90 | Fill #0

## 2015-07-07 MED FILL — ALPRAZolam 1 MG TABS: 1 | 90 days supply | Qty: 270 | Fill #0

## 2015-08-04 ENCOUNTER — Other Ambulatory Visit: Payer: Self-pay | Admitting: *Deleted

## 2015-08-04 MED ORDER — APIXABAN 5 MG PO TABS: 5.0000 mg | ORAL_TABLET | Freq: Two times a day (BID) | ORAL | Status: DC

## 2015-08-06 MED FILL — TEMAZEPAM 30 MG CAPSULE: 30 | 30 days supply | Qty: 30 | Fill #0

## 2015-08-06 MED FILL — ELIQUIS 5 MG TABLET: 5 | 30 days supply | Qty: 60 | Fill #3

## 2015-08-06 MED FILL — CARVEDILOL 6.25 MG TABLET: 6.25 | 30 days supply | Qty: 60 | Fill #1

## 2015-08-26 DIAGNOSIS — M25541 Pain in joints of right hand: Secondary | ICD-10-CM | POA: Diagnosis not present

## 2015-08-26 DIAGNOSIS — M150 Primary generalized (osteo)arthritis: Secondary | ICD-10-CM | POA: Diagnosis not present

## 2015-08-26 DIAGNOSIS — Z79891 Long term (current) use of opiate analgesic: Secondary | ICD-10-CM | POA: Diagnosis not present

## 2015-08-26 DIAGNOSIS — G894 Chronic pain syndrome: Secondary | ICD-10-CM | POA: Diagnosis not present

## 2015-08-29 DIAGNOSIS — M19032 Primary osteoarthritis, left wrist: Secondary | ICD-10-CM | POA: Diagnosis not present

## 2015-08-29 DIAGNOSIS — M19031 Primary osteoarthritis, right wrist: Secondary | ICD-10-CM | POA: Diagnosis not present

## 2015-09-04 MED FILL — CARVEDILOL 6.25 MG TABLET: 6.25 | 30 days supply | Qty: 60 | Fill #2

## 2015-09-04 MED FILL — TEMAZEPAM 30 MG CAPSULE: 30 | 30 days supply | Qty: 30 | Fill #1

## 2015-09-04 MED FILL — ELIQUIS 5 MG TABLET: 5 | 30 days supply | Qty: 60 | Fill #4

## 2015-10-07 DIAGNOSIS — M0609 Rheumatoid arthritis without rheumatoid factor, multiple sites: Secondary | ICD-10-CM | POA: Diagnosis not present

## 2015-10-07 DIAGNOSIS — M109 Gout, unspecified: Secondary | ICD-10-CM | POA: Diagnosis not present

## 2015-10-07 DIAGNOSIS — F17211 Nicotine dependence, cigarettes, in remission: Secondary | ICD-10-CM | POA: Diagnosis not present

## 2015-10-07 DIAGNOSIS — I4891 Unspecified atrial fibrillation: Secondary | ICD-10-CM | POA: Diagnosis not present

## 2015-10-08 ENCOUNTER — Other Ambulatory Visit: Payer: Self-pay | Admitting: *Deleted

## 2015-10-08 ENCOUNTER — Other Ambulatory Visit: Payer: Self-pay | Admitting: Cardiovascular Disease

## 2015-10-08 MED ORDER — APIXABAN 5 MG PO TABS: 5.0000 mg | ORAL_TABLET | Freq: Two times a day (BID) | ORAL | Status: DC

## 2015-10-08 MED ORDER — CARVEDILOL 6.25 MG PO TABS: 6.2500 mg | ORAL_TABLET | Freq: Two times a day (BID) | ORAL | Status: DC

## 2015-10-08 MED FILL — ALLOPURINOL 300 MG TABLET: 300 | 90 days supply | Qty: 90 | Fill #1

## 2015-10-08 MED FILL — CARVEDILOL 6.25 MG TABLET: 6.25 | 90 days supply | Qty: 180 | Fill #0

## 2015-10-08 MED FILL — ALPRAZolam 1 MG TABS: 1 | 90 days supply | Qty: 270 | Fill #0

## 2015-10-08 MED FILL — ELIQUIS 5 MG TABLET: 5 | 90 days supply | Qty: 180 | Fill #0

## 2015-10-08 MED FILL — TEMAZEPAM 30 MG CAPSULE: 30 | 90 days supply | Qty: 90 | Fill #0

## 2015-10-08 NOTE — Telephone Encounter (Signed)
Follow-up    The wife was concerned the pt usually gets a 90 day supply but his medication were only filled for 30 day supply

## 2015-10-08 NOTE — Telephone Encounter (Signed)
Per msg on the refill vm, patient would like these to be for ninety day supplies.

## 2015-10-13 DIAGNOSIS — Z1211 Encounter for screening for malignant neoplasm of colon: Secondary | ICD-10-CM | POA: Diagnosis not present

## 2015-10-13 DIAGNOSIS — Z1212 Encounter for screening for malignant neoplasm of rectum: Secondary | ICD-10-CM | POA: Diagnosis not present

## 2015-10-30 ENCOUNTER — Encounter (INDEPENDENT_AMBULATORY_CARE_PROVIDER_SITE_OTHER): Payer: Self-pay | Admitting: Internal Medicine

## 2015-11-10 ENCOUNTER — Other Ambulatory Visit (INDEPENDENT_AMBULATORY_CARE_PROVIDER_SITE_OTHER): Payer: Self-pay | Admitting: Internal Medicine

## 2015-11-10 ENCOUNTER — Telehealth: Payer: Self-pay | Admitting: Cardiovascular Disease

## 2015-11-10 ENCOUNTER — Telehealth (INDEPENDENT_AMBULATORY_CARE_PROVIDER_SITE_OTHER): Payer: Self-pay | Admitting: *Deleted

## 2015-11-10 ENCOUNTER — Encounter (INDEPENDENT_AMBULATORY_CARE_PROVIDER_SITE_OTHER): Payer: Self-pay | Admitting: Internal Medicine

## 2015-11-10 ENCOUNTER — Ambulatory Visit (INDEPENDENT_AMBULATORY_CARE_PROVIDER_SITE_OTHER): Payer: PPO | Admitting: Internal Medicine

## 2015-11-10 ENCOUNTER — Encounter (INDEPENDENT_AMBULATORY_CARE_PROVIDER_SITE_OTHER): Payer: Self-pay | Admitting: *Deleted

## 2015-11-10 ENCOUNTER — Encounter (INDEPENDENT_AMBULATORY_CARE_PROVIDER_SITE_OTHER): Payer: Self-pay

## 2015-11-10 VITALS — BP 122/78 | HR 68 | Temp 98.0°F | Ht 70.0 in | Wt 292.6 lb

## 2015-11-10 DIAGNOSIS — R195 Other fecal abnormalities: Secondary | ICD-10-CM

## 2015-11-10 DIAGNOSIS — I4891 Unspecified atrial fibrillation: Secondary | ICD-10-CM

## 2015-11-10 MED ORDER — PEG-KCL-NACL-NASULF-NA ASC-C 100 G PO SOLR: 1.0000 | Freq: Once | ORAL | 0 refills | Status: AC

## 2015-11-10 NOTE — Telephone Encounter (Signed)
Patient needs movi prep 

## 2015-11-10 NOTE — Telephone Encounter (Signed)
Request for surgical clearance:  1. What type of surgery is being performed? Colonoscopy   2. When is this surgery scheduled?9 -14-17  3. Are there any medications that need to be held prior to surgery and how long?Can he stop his Eliquis 2 days before?  4. Name of physician performing surgery? Dr Hildred Laser   5. What is your office phone and fax number? Please send thru Epic

## 2015-11-10 NOTE — Patient Instructions (Signed)
The risks and benefits such as perforation, bleeding, and infection were reviewed with the patient and is agreeable. 

## 2015-11-10 NOTE — Telephone Encounter (Signed)
Surgical clearance routed to MD Claiborne Billings and Primary RN Mariann Laster.

## 2015-11-10 NOTE — Progress Notes (Signed)
Subjective:    Patient ID: Garrett Munoz, male    DOB: Oct 09, 1945, 70 y.o.   MRN: [id number]  HPI Referred by Dr. Thressa Sheller for +Cologard. Also had + stool card. Last colonoscopy in October of 2015 (screening) Dr. Laural Golden: Patient denies seeing blood. He has a BM 3-4 times a day which is normal for him. No melena or BRRB.  No family hx of colon cancer. His appetite is good. No weight loss.  He is not having any GI problems.  hx of atrial fib and maintained on Eliquis.    FINAL DIAGNOSES:  1.  Normal colonoscopy.  2.  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  Review of Systems Past Medical History:  Diagnosis Date  . Bronchitis    hx  . Cataract    right eye  . DDD (degenerative disc disease), lumbosacral   . Dysrhythmia    irregular heatbeat  . Eye worm    right eye  . Gout   . Left knee DJD   . Pneumonia    hx  . Radicular syndrome of left leg   . S/P total knee replacement    right  . Sleep apnea    can't use sleep study 10 yrs ago  . 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     Past Surgical History:  Procedure Laterality Date  . CARDIOVERSION N/A 03/04/2015   Procedure: CARDIOVERSION;  Surgeon: [name], MD;  Location: [place];  Service: Cardiovascular;  Laterality: N/A;  . CHOLECYSTECTOMY    . EYE EXAMINATION UNDER ANESTHESIA W/ RETINAL CRYOTHERAPY AND RETINAL LASER  07/2011  . EYE SURGERY  02/2011   cat rt  . KNEE ARTHROSCOPY     bilateral  . RETINAL DETACHMENT SURGERY  03/2011   rt  . TOTAL KNEE ARTHROPLASTY  2003   rt  . TOTAL KNEE ARTHROPLASTY  02/07/2012   Procedure: TOTAL KNEE ARTHROPLASTY;  Surgeon: [name], MD;  Location: [place];  Service: Orthopedics;  Laterality: Left;    No Known Allergies  Current Outpatient Prescriptions on File Prior to Visit  Medication Sig Dispense Refill  . acetaminophen (TYLENOL) 325 MG tablet Take 2 tablets (650 mg total) by mouth every 6 (six) hours as needed (or  Fever >/= 101). (Patient taking differently: Take 650 mg by mouth every 6 (six) hours as needed for mild pain (or Fever >/= 101). )    . allopurinol (ZYLOPRIM) 300 MG tablet Take 300 mg by mouth daily.    [name] ALPRAZolam (XANAX) 1 MG tablet Take 1 mg by mouth at bedtime.   2  . apixaban (ELIQUIS) 5 MG TABS tablet Take 1 tablet (5 mg total) by mouth 2 (two) times daily. 180 tablet 3  . carvedilol (COREG) 6.25 MG tablet Take 1 tablet (6.25 mg total) by mouth 2 (two) times daily. 180 tablet 3  . fish oil-omega-3 fatty acids 1000 MG capsule Take 1 g by mouth daily.    . magnesium oxide (MAG-OX) 400 MG tablet Take 400 mg by mouth daily.    . Misc Natural Products (BLACK CHERRY CONCENTRATE PO) Take 1 tablet by mouth daily.    . temazepam (RESTORIL) 30 MG capsule Take 30 mg by mouth at bedtime.     No current facility-administered medications on file prior to visit.        Objective:   Physical Exam Blood pressure 122/78, pulse 68, temperature 98 F (36.7 C), height 5\' 10"  (1.778 m),  weight 292 lb 9.6 oz (132.7 kg). Alert and oriented. Skin warm and dry. Oral mucosa is moist.   . Sclera anicteric, conjunctivae is pink. Thyroid not enlarged. No cervical lymphadenopathy. Lungs clear. Heart regular rate and rhythm.  Abdomen is soft. Bowel sounds are positive. No hepatomegaly. No abdominal masses felt. No tenderness.  No edema to lower extremities.       Assessment & Plan:  Positive stool sample. Needs surveillance colonoscopy. Colon cancer needs to be ruled out. The risks and benefits such as perforation, bleeding, and infection were reviewed with the patient and is agreeable.

## 2015-11-26 NOTE — Telephone Encounter (Signed)
Garrett Munoz,  can you do this. Dr Claiborne Billings has had it for 2 weeks and no response.

## 2015-11-26 NOTE — Telephone Encounter (Signed)
(  Reviewed with Kerin Ransom PA).  Ok to hold Eliquis x 2 days for colonoscopy.  Restart following day unless otherwise recommended by GI

## 2015-11-27 ENCOUNTER — Encounter: Payer: Self-pay | Admitting: *Deleted

## 2015-11-27 NOTE — Telephone Encounter (Signed)
Colonoscopy clearance sent via EPIC

## 2015-12-01 NOTE — Telephone Encounter (Signed)
Patient aware.

## 2015-12-04 NOTE — Telephone Encounter (Signed)
agree

## 2015-12-05 ENCOUNTER — Other Ambulatory Visit: Payer: Self-pay | Admitting: Cardiovascular Disease

## 2015-12-05 DIAGNOSIS — I4891 Unspecified atrial fibrillation: Secondary | ICD-10-CM | POA: Diagnosis not present

## 2015-12-05 DIAGNOSIS — R5383 Other fatigue: Secondary | ICD-10-CM | POA: Diagnosis not present

## 2015-12-06 LAB — COMPREHENSIVE METABOLIC PANEL
ALBUMIN: 4.1 g/dL (ref 3.5–4.8)
ALT: 20 IU/L (ref 0–44)
AST: 21 IU/L (ref 0–40)
Albumin/Globulin Ratio: 1.5 (ref 1.2–2.2)
Alkaline Phosphatase: 63 IU/L (ref 39–117)
BUN/Creatinine Ratio: 11 (ref 10–24)
BUN: 11 mg/dL (ref 8–27)
Bilirubin Total: 0.3 mg/dL (ref 0.0–1.2)
CALCIUM: 9.3 mg/dL (ref 8.6–10.2)
CO2: 26 mmol/L (ref 18–29)
CREATININE: 0.97 mg/dL (ref 0.76–1.27)
Chloride: 107 mmol/L — ABNORMAL HIGH (ref 96–106)
GFR calc Af Amer: 90 mL/min/{1.73_m2} (ref >59)
GFR, EST NON AFRICAN AMERICAN: 78 mL/min/{1.73_m2} (ref >59)
GLOBULIN, TOTAL: 2.7 g/dL (ref 1.5–4.5)
GLUCOSE: 118 mg/dL — AB (ref 65–99)
POTASSIUM: 4.5 mmol/L (ref 3.5–5.2)
SODIUM: 148 mmol/L — AB (ref 134–144)
Total Protein: 6.8 g/dL (ref 6.0–8.5)

## 2015-12-06 LAB — CBC WITH DIFFERENTIAL/PLATELET
Basophils Absolute: 0.1 10*3/uL (ref 0.0–0.2)
Basos: 1 %
EOS (ABSOLUTE): 0.3 10*3/uL (ref 0.0–0.4)
EOS: 4 %
HEMATOCRIT: 49.1 % (ref 37.5–51.0)
HEMOGLOBIN: 16.5 g/dL (ref 12.6–17.7)
IMMATURE GRANS (ABS): 0 10*3/uL (ref 0.0–0.1)
IMMATURE GRANULOCYTES: 0 %
LYMPHS: 36 %
Lymphocytes Absolute: 2.2 10*3/uL (ref 0.7–3.1)
MCH: 32.2 pg (ref 26.6–33.0)
MCHC: 33.6 g/dL (ref 31.5–35.7)
MCV: 96 fL (ref 79–97)
MONOCYTES: 8 %
Monocytes Absolute: 0.5 10*3/uL (ref 0.1–0.9)
NEUTROS PCT: 51 %
Neutrophils Absolute: 3.1 10*3/uL (ref 1.4–7.0)
Platelets: 199 10*3/uL (ref 150–379)
RBC: 5.13 x10E6/uL (ref 4.14–5.80)
RDW: 14.7 % (ref 12.3–15.4)
WBC: 6.2 10*3/uL (ref 3.4–10.8)

## 2015-12-06 LAB — LIPID PANEL W/O CHOL/HDL RATIO
CHOLESTEROL TOTAL: 147 mg/dL (ref 100–199)
HDL: 55 mg/dL (ref >39)
LDL Calculated: 76 mg/dL (ref 0–99)
TRIGLYCERIDES: 81 mg/dL (ref 0–149)
VLDL Cholesterol Cal: 16 mg/dL (ref 5–40)

## 2015-12-06 LAB — TSH: TSH: 0.989 u[IU]/mL (ref 0.450–4.500)

## 2015-12-18 MED FILL — MOVIPREP POWDER KIT: 100 | 1 days supply | Qty: 1 | Fill #0

## 2015-12-24 ENCOUNTER — Ambulatory Visit (INDEPENDENT_AMBULATORY_CARE_PROVIDER_SITE_OTHER): Payer: PPO | Admitting: Cardiovascular Disease

## 2015-12-24 ENCOUNTER — Encounter: Payer: Self-pay | Admitting: Cardiovascular Disease

## 2015-12-24 VITALS — BP 132/76 | HR 102 | Ht 70.5 in | Wt 298.8 lb

## 2015-12-24 DIAGNOSIS — I482 Chronic atrial fibrillation: Secondary | ICD-10-CM | POA: Diagnosis not present

## 2015-12-24 DIAGNOSIS — G4733 Obstructive sleep apnea (adult) (pediatric): Secondary | ICD-10-CM | POA: Diagnosis not present

## 2015-12-24 DIAGNOSIS — I4891 Unspecified atrial fibrillation: Secondary | ICD-10-CM | POA: Diagnosis not present

## 2015-12-24 DIAGNOSIS — I4821 Permanent atrial fibrillation: Secondary | ICD-10-CM

## 2015-12-24 DIAGNOSIS — I10 Essential (primary) hypertension: Secondary | ICD-10-CM

## 2015-12-24 DIAGNOSIS — Z7901 Long term (current) use of anticoagulants: Secondary | ICD-10-CM

## 2015-12-24 MED ORDER — CARVEDILOL 12.5 MG PO TABS: 12.5000 mg | ORAL_TABLET | Freq: Two times a day (BID) | ORAL | 3 refills | Status: DC

## 2015-12-24 MED FILL — CARVEDILOL 12.5 MG TABLET: 12.5 | 90 days supply | Qty: 180 | Fill #0

## 2015-12-24 NOTE — Patient Instructions (Addendum)
Your physician has recommended you make the following change in your medication: the carvedilol has been increased from 6.25 mg to 12.5 mg twice a day.  Your physician wants you to follow-up in: 6 months or sooner if needed. You will receive a reminder letter in the mail two months in advance. If you don't receive a letter, please call our office to schedule the follow-up appointment.  If you need a refill on your cardiac medications before your next appointment, please call your pharmacy.  You are Okay to have your colonoscopy. You are to hold your Eliquis 48 hours prior to the procedure.

## 2015-12-26 NOTE — Progress Notes (Signed)
Patient ID: Garrett Munoz, male   DOB: Mar 22, 1946, 70 y.o.   MRN: [id number]     HPI: Garrett Munoz is a 70 y.o. male who presents to the office today for a 6 month follow-up evaluation.  Garrett Munoz has a history of hypertension, and remotely had taken blood pressure medications for several years but none since the last 20 years. He has a history of prior hypertension, obstructive sleep apnea, untreated due to previous intolerance to full face mask, as well as obesity.  He admits to having an intermittent irregular rhythm.  Last autumn he was scheduled to undergo elective surgery on his right foot hammertoe by Dr. Fritzi Mandes.  Surgery was canceled due to concerns for possible Mobitz type II block with possible atrial flutter.  He was seen by me for preoperative evaluation on 01/30/2015.  At that time, his ECG demonstrated atrial fibrillation with ventricular rate at approximately 100 bpm.  He was noted have small Q wave in lead 3.  He was started on Toprol 25 mg for 4 days and this was titrated up to 50 mg.  He also was started on eloquence 5 mg twice a day for anticoagulation.  A 2-D echo Doppler study on February 18 2015 revealed an ejection fraction of 55-60%.  There was moderate left ventricular hypertrophy.  The left atrium was moderately dilated.  A nuclear perfusion study done on 02/13/2015 was low risk without ischemia with only a mild apical defect, probably attenuation. He denies any chest pain.  He denies shortness of breath.  He is been unable to walk secondary to his toe abnormality.  He admits to daytime sleepiness and snoring.  He had not utilized CPAP therapy in years.  He is status post cataract surgery with lens implant.     He underwent cardioversion on March 04, 2015 and was successfully converted back to sinus rhythm.  On 04/10/2015 he was admitted with community-acquired pneumonia and was found to be back in atrial fibrillation.  He denies any episodes of chest pain. He has been on  eliquis 5 mg twice a day for anticoagulation , carvedilol 6.25 mg twice a day.  He is unaware of his rhythm being abnormal. He is not sleeping well but has not been using his CPAP. He feels fatigued.  1.  I last saw him in March 2017.  He continued to be in atrial fibrillation.  After much discussion, guarding, another attempt at trying to convert into sinus rhythm versus staying in permanent atrial fibrillation.  He opted to stay in permanent atrial fibrillation.  He has continued to be on anticoagulation therapy.  He denies chest pain or shortness of breath.  He will be undergoing colonoscopy in the near future.  He presents for evaluation.   Past Medical History:  Diagnosis Date  . Bronchitis    hx  . Cataract    right eye  . DDD (degenerative disc disease), lumbosacral   . Dysrhythmia    irregular heatbeat  . Eye worm    right eye  . Gout   . Left knee DJD   . Pneumonia    hx  . Radicular syndrome of left leg   . S/P total knee replacement    right  . Sleep apnea    can't use sleep study 10 yrs ago  . 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     Past Surgical History:  Procedure Laterality Date  . CARDIOVERSION N/A 03/04/2015   Procedure: CARDIOVERSION;  Surgeon:  [name], MD;  Location: [place];  Service: Cardiovascular;  Laterality: N/A;  . CHOLECYSTECTOMY    . EYE EXAMINATION UNDER ANESTHESIA W/ RETINAL CRYOTHERAPY AND RETINAL LASER  07/2011  . EYE SURGERY  02/2011   cat rt  . KNEE ARTHROSCOPY     bilateral  . RETINAL DETACHMENT SURGERY  03/2011   rt  . TOTAL KNEE ARTHROPLASTY  2003   rt  . TOTAL KNEE ARTHROPLASTY  02/07/2012   Procedure: TOTAL KNEE ARTHROPLASTY;  Surgeon: [name], MD;  Location: [place];  Service: Orthopedics;  Laterality: Left;    No Known Allergies  Current Outpatient Prescriptions  Medication Sig Dispense Refill  . acetaminophen (TYLENOL) 325 MG tablet Take 2 tablets (650 mg total) by mouth every 6 (six) hours as needed (or Fever >/=  101). (Patient taking differently: Take 650 mg by mouth every 6 (six) hours as needed for mild pain (or Fever >/= 101). )    . allopurinol (ZYLOPRIM) 300 MG tablet Take 300 mg by mouth daily.    [name] ALPRAZolam (XANAX) 1 MG tablet Take 1 mg by mouth at bedtime.   2  . apixaban (ELIQUIS) 5 MG TABS tablet Take 1 tablet (5 mg total) by mouth 2 (two) times daily. 180 tablet 3  . fish oil-omega-3 fatty acids 1000 MG capsule Take 1 g by mouth daily.    . magnesium oxide (MAG-OX) 400 MG tablet Take 400 mg by mouth daily.    . Misc Natural Products (BLACK CHERRY CONCENTRATE PO) Take 1 tablet by mouth daily.    . temazepam (RESTORIL) 30 MG capsule Take 30 mg by mouth at bedtime.    . carvedilol (COREG) 12.5 MG tablet Take 1 tablet (12.5 mg total) by mouth 2 (two) times daily. 180 tablet 3   No current facility-administered medications for this visit.     Social History   Social History  . Marital status: Married    Spouse name: N/A  . Number of children: N/A  . Years of education: N/A   Occupational History  . Not on file.   Social History Main Topics  . Smoking status: Former Smoker    Packs/day: 2.00    Years: 15.00    Types: Cigarettes    Quit date: 02/02/1992  . Smokeless tobacco: Not on file     Comment: occ alcohol  . Alcohol use 0.0 oz/week     Comment: occasional  . Drug use: No  . Sexual activity: Not on file   Other Topics Concern  . Not on file   Social History Narrative  . No narrative on file   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  He is married.   Family History  Problem Relation Age of Onset  . Lung cancer Father   . Lung cancer Sister    Family history is notable that his father died at 64 with lung CA and his mother died at 68.  She had an irregular heart rhythm.  He has 2 sisters and one is deceased secondary to cancer and 1 brother.  ROS General: Negative; No fevers, chills, or night sweats HEENT: He has  a right eye lens  implant after cataract surgery No changes in vision or hearing, sinus congestion, difficulty swallowing Pulmonary: Negative; No cough, wheezing, shortness of breath, hemoptysis Cardiovascular: See HPI:  GI: Negative; No nausea, vomiting, diarrhea, or abdominal pain GU: Negative; No dysuria, hematuria,  or difficulty voiding Musculoskeletal: Negative; no myalgias, joint pain, or weakness Hematologic: Negative; no easy bruising, bleeding Endocrine: Negative; no heat/cold intolerance; no diabetes, Neuro: Negative; no changes in balance, headaches Skin: Negative; No rashes or skin lesions Psychiatric: Negative; No behavioral problems, depression Sleep: Positive for untreated sleep apnea; positive for snoring and hypersomnolence. Other comprehensive 14 point system review is negative   Physical Exam BP 132/76   Pulse (!) 102   Ht 5' 10.5" (1.791 m)   Wt 298 lb 12.8 oz (135.5 kg)   BMI 42.27 kg/m   Repeat blood pressure 128/82  Wt Readings from Last 3 Encounters:  12/24/15 298 lb 12.8 oz (135.5 kg)  11/10/15 292 lb 9.6 oz (132.7 kg)  06/23/15 (!) 302 lb (137 kg)   General: Alert, oriented, no distress.  Skin: normal turgor, no rashes, warm and dry HEENT: Normocephalic, atraumatic. Pupils equal round and reactive to light; sclera anicteric; extraocular muscles intact, No lid lag; Nose without nasal septal hypertrophy; Mouth/Parynx benign; Mallinpatti scale 3 Neck: No JVD, no carotid bruits; normal carotid upstroke Lungs: clear to ausculatation and percussion bilaterally; no wheezing or rales, normal inspiratory and expiratory effort Chest wall: without tenderness to palpitation Heart: Irregularly irregular rhythm with a rate at approximately 100, s1 s2 normal, 1/6 systolic murmur, No diastolic murmur, no rubs, gallops, thrills, or heaves Abdomen: Moderate central adiposity soft, nontender; no hepatosplenomehaly, BS+; abdominal aorta nontender and not dilated by  palpation. Back: no CVA tenderness Pulses: 2+  Musculoskeletal: full range of motion, normal strength, no joint deformities Extremities: Pulses 2+, no clubbing cyanosis or edema, Homan's sign negative  Neurologic: grossly nonfocal; Cranial nerves grossly wnl Psychologic: Normal mood and affect  ECG (independently read by me): Atrial fibrillation with ventricular rate at 10 2 bpm.  Occasional unifocal PVCs versus a bare complex.  March 2017 ECG (independently read by me):  Atrial fibrillation at 83 bpm.  02/21/2015 ECG (independently read by me): Atrial fibrillation at 99 bpm.  01/30/2015 ECG (independently read by me): Atrial fibrillation at a approximately 90-100 bpm.  Small Q wave in lead 3.  Early transition.  LABS:  BMP Latest Ref Rng & Units 12/05/2015 04/10/2015 02/25/2015  Glucose 65 - 99 mg/dL 118(H) 138(H) 111(H)  BUN 8 - 27 mg/dL _0 Creatinine 0.76 - 1.27 mg/dL 0.97 1.03 0.90  BUN/Creat Ratio 10 - 24 11 - -  Sodium 134 - 144 mmol/L 148(H) 140 141  Potassium 3.5 - 5.2 mmol/L 4.5 4.7 4.4  Chloride 96 - 106 mmol/L 107(H) 103 106  CO2 18 - 29 mmol/L _1 Calcium 8.6 - 10.2 mg/dL 9.3 9.2 9.1    Hepatic Function Latest Ref Rng & Units 12/05/2015 04/10/2015 01/30/2015  Total Protein 6.0 - 8.5 g/dL 6.8 6.5 6.7  Albumin 3.5 - 4.8 g/dL 4.1 3.6 4.0  AST 0 - 40 IU/L _2 ALT 0 - 44 IU/L _3 Alk Phosphatase 39 - 117 IU/L 63 64 60  Total Bilirubin 0.0 - 1.2 mg/dL 0.3 0.8 0.4  Bilirubin, Direct 0.1 - 0.5 mg/dL - 0.2 -    CBC Latest Ref Rng & Units 12/05/2015 04/10/2015 02/25/2015  WBC 3.4 - 10.8 x10E3/uL 6.2 12.2(H) 6.7  Hemoglobin 13.0 - 17.0 g/dL - 15.8 16.4  Hematocrit 37.5 - 51.0 % 49.1 47.7 46.3  Platelets 150 - 379 x10E3/uL 199 166 196   Lab Results  Component Value Date   MCV 96 12/05/2015   MCV  95.2 04/10/2015   MCV 91.1 02/25/2015    Lab Results  Component Value Date   TSH 0.989 12/05/2015    BNP No results found for: BNP  ProBNP No  results found for: PROBNP   Lipid Panel     Component Value Date/Time   CHOL 147 12/05/2015 0901   TRIG 81 12/05/2015 0901   HDL 55 12/05/2015 0901   CHOLHDL 2.8 01/30/2015 0920   VLDL 27 01/30/2015 0920   LDLCALC 76 12/05/2015 0901     RADIOLOGY: No results found.    ASSESSMENT AND PLAN: Mr. Guerino Caporale is a 70 year old gentleman who has a history of morbid obesity with a body mass index of 43.33, a remote history of hypertension and a history of obstructive sleep apnea, untreated for many years.  When I saw him  In November 2016, his ECG revealed atrial fibrillation of questionable duration.  He was on Eliquis anticoagulation and subsequently underwent successful cardioversion with restoration of sinus rhythm. An echo revealed moderate LVH with normal systolic function with an EF of 55-60%.  He had moderate dilatation of his left atrium.  In retrospect, he does not recall that he felt any different being in sinus rhythm. He developed an episode of pneumonia and was found to be back in atrial fibrillation in December. 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  His ventricular rate is increased today.  I am further titrating carvedilol to 12.5 mg twice a day.  I reviewed recent blood work from 12/05/2015.  He has normal renal function.  Lipid studies are excellent with an LDL of 76.  I have given him clearance to undergo his planned colonoscopy, but he will need to hold eliquis 48 hours prior to the procedure.  I will see him in 6 months for cardiology reevaluation Time spent: 25 minutes  [name], MD, [facility]  12/26/2015 7:04 PM

## 2016-01-01 ENCOUNTER — Ambulatory Visit (HOSPITAL_COMMUNITY)
Admission: RE | Admit: 2016-01-01 | Discharge: 2016-01-01 | Disposition: A | Discharge status: Home / Self Care | Payer: PPO | Source: Ambulatory Visit | Attending: Internal Medicine | Admitting: Internal Medicine

## 2016-01-01 ENCOUNTER — Encounter (HOSPITAL_COMMUNITY): Payer: Self-pay | Admitting: *Deleted

## 2016-01-01 ENCOUNTER — Encounter (HOSPITAL_COMMUNITY)
Admission: RE | Disposition: A | Discharge status: Home / Self Care | Payer: Self-pay | Source: Ambulatory Visit | Attending: Internal Medicine

## 2016-01-01 DIAGNOSIS — D122 Benign neoplasm of ascending colon: Secondary | ICD-10-CM | POA: Diagnosis not present

## 2016-01-01 DIAGNOSIS — K589 Irritable bowel syndrome without diarrhea: Secondary | ICD-10-CM | POA: Insufficient documentation

## 2016-01-01 DIAGNOSIS — Z87891 Personal history of nicotine dependence: Secondary | ICD-10-CM | POA: Diagnosis not present

## 2016-01-01 DIAGNOSIS — Z79899 Other long term (current) drug therapy: Secondary | ICD-10-CM | POA: Diagnosis not present

## 2016-01-01 DIAGNOSIS — K5730 Diverticulosis of large intestine without perforation or abscess without bleeding: Secondary | ICD-10-CM | POA: Insufficient documentation

## 2016-01-01 DIAGNOSIS — D123 Benign neoplasm of transverse colon: Secondary | ICD-10-CM | POA: Insufficient documentation

## 2016-01-01 DIAGNOSIS — Z96653 Presence of artificial knee joint, bilateral: Secondary | ICD-10-CM | POA: Diagnosis not present

## 2016-01-01 DIAGNOSIS — I4891 Unspecified atrial fibrillation: Secondary | ICD-10-CM | POA: Diagnosis not present

## 2016-01-01 DIAGNOSIS — G4730 Sleep apnea, unspecified: Secondary | ICD-10-CM | POA: Insufficient documentation

## 2016-01-01 DIAGNOSIS — Z7901 Long term (current) use of anticoagulants: Secondary | ICD-10-CM | POA: Insufficient documentation

## 2016-01-01 DIAGNOSIS — M109 Gout, unspecified: Secondary | ICD-10-CM | POA: Insufficient documentation

## 2016-01-01 DIAGNOSIS — D125 Benign neoplasm of sigmoid colon: Secondary | ICD-10-CM | POA: Diagnosis not present

## 2016-01-01 DIAGNOSIS — R195 Other fecal abnormalities: Secondary | ICD-10-CM | POA: Diagnosis not present

## 2016-01-01 DIAGNOSIS — D124 Benign neoplasm of descending colon: Secondary | ICD-10-CM | POA: Insufficient documentation

## 2016-01-01 HISTORY — PX: COLONOSCOPY: SHX5424

## 2016-01-01 HISTORY — PX: POLYPECTOMY: SHX5525

## 2016-01-01 SURGERY — COLONOSCOPY
Anesthesia: Moderate Sedation

## 2016-01-01 MED ORDER — MEPERIDINE HCL 50 MG/ML IJ SOLN
INTRAMUSCULAR | Status: DC | PRN
  Administered 2016-01-01 (×2): 25 mg via INTRAVENOUS

## 2016-01-01 MED ORDER — GLUCAGON HCL RDNA (DIAGNOSTIC) 1 MG IJ SOLR
INTRAMUSCULAR | Status: AC
  Filled 2016-01-01: qty 1

## 2016-01-01 MED ORDER — MIDAZOLAM HCL 5 MG/5ML IJ SOLN
INTRAMUSCULAR | Status: DC | PRN
  Administered 2016-01-01 (×2): 2 mg via INTRAVENOUS
  Administered 2016-01-01: 1 mg via INTRAVENOUS
  Administered 2016-01-01: 2 mg via INTRAVENOUS

## 2016-01-01 MED ORDER — GLUCAGON HCL (RDNA) 1 MG IJ SOLR
INTRAMUSCULAR | Status: DC | PRN
  Administered 2016-01-01 (×2): .5 mg via INTRAVENOUS

## 2016-01-01 MED ORDER — STERILE WATER FOR IRRIGATION IR SOLN
Status: DC | PRN
  Administered 2016-01-01: 14:00:00

## 2016-01-01 MED ORDER — MIDAZOLAM HCL 5 MG/5ML IJ SOLN
INTRAMUSCULAR | Status: AC
  Filled 2016-01-01: qty 10

## 2016-01-01 MED ORDER — MEPERIDINE HCL 50 MG/ML IJ SOLN
INTRAMUSCULAR | Status: AC
  Filled 2016-01-01: qty 1

## 2016-01-01 MED ORDER — SODIUM CHLORIDE 0.9 % IV SOLN
INTRAVENOUS | Status: DC
  Administered 2016-01-01: 1000 mL via INTRAVENOUS

## 2016-01-01 NOTE — Discharge Instructions (Signed)
Resume Apixaban on 01/05/2016. Resume other medications and diet as before. No driving for 24 hours. Physician will call with biopsy results.   Colonoscopy, Care After These instructions give you information on caring for yourself after your procedure. Your doctor may also give you more specific instructions. Call your doctor if you have any problems or questions after your procedure. HOME CARE  Do not drive for 24 hours.  Do not sign important papers or use machinery for 24 hours.  You may shower.  You may go back to your usual activities, but go slower for the first 24 hours.  Take rest breaks often during the first 24 hours.  Walk around or use warm packs on your belly (abdomen) if you have belly cramping or gas.  Drink enough fluids to keep your pee (urine) clear or pale yellow.  Resume your normal diet. Avoid heavy or fried foods.  Avoid drinking alcohol for 24 hours or as told by your doctor.  Only take medicines as told by your doctor. If a tissue sample (biopsy) was taken during the procedure:   Do not take aspirin or blood thinners for 7 days, or as told by your doctor.  Do not drink alcohol for 7 days, or as told by your doctor.  Eat soft foods for the first 24 hours. GET HELP IF: You still have a small amount of blood in your poop (stool) 2-3 days after the procedure. GET HELP RIGHT AWAY IF:  You have more than a small amount of blood in your poop.  You see clumps of tissue (blood clots) in your poop.  Your belly is puffy (swollen).  You feel sick to your stomach (nauseous) or throw up (vomit).  You have a fever.  You have belly pain that gets worse and medicine does not help. MAKE SURE YOU:  Understand these instructions.  Will watch your condition.  Will get help right away if you are not doing well or get worse.   This information is not intended to replace advice given to you by your health care provider. Make sure you discuss any questions  you have with your health care provider.   Document Released: 05/08/2010 Document Revised: 04/10/2013 Document Reviewed: 12/11/2012 Elsevier Interactive Patient Education 2016 [employer]   Colon Polyps Polyps are lumps of extra tissue growing inside the body. Polyps can grow in the large intestine (colon). Most colon polyps are noncancerous (benign). However, some colon polyps can become cancerous over time. Polyps that are larger than a pea may be harmful. To be safe, caregivers remove and test all polyps. CAUSES  Polyps form when mutations in the genes cause your cells to grow and divide even though no more tissue is needed. RISK FACTORS There are a number of risk factors that can increase your chances of getting colon polyps. They include:  Being older than 50 years.  Family history of colon polyps or colon cancer.  Long-term colon diseases, such as colitis or Crohn disease.  Being overweight.  Smoking.  Being inactive.  Drinking too much alcohol. SYMPTOMS  Most small polyps do not cause symptoms. If symptoms are present, they may include:  Blood in the stool. The stool may look dark red or black.  Constipation or diarrhea that lasts longer than 1 week. DIAGNOSIS People often do not know they have polyps until their caregiver finds them during a regular checkup. Your caregiver can use 4 tests to check for polyps:  Digital rectal exam. The caregiver wears  gloves and feels inside the rectum. This test would find polyps only in the rectum.  Barium enema. The caregiver puts a liquid called barium into your rectum before taking X-rays of your colon. Barium makes your colon look white. Polyps are dark, so they are easy to see in the X-ray pictures.  Sigmoidoscopy. A thin, flexible tube (sigmoidoscope) is placed into your rectum. The sigmoidoscope has a light and tiny camera in it. The caregiver uses the sigmoidoscope to look at the last third of your colon.  Colonoscopy.  This test is like sigmoidoscopy, but the caregiver looks at the entire colon. This is the most common method for finding and removing polyps. TREATMENT  Any polyps will be removed during a sigmoidoscopy or colonoscopy. The polyps are then tested for cancer. PREVENTION  To help lower your risk of getting more colon polyps:  Eat plenty of fruits and vegetables. Avoid eating fatty foods.  Do not smoke.  Avoid drinking alcohol.  Exercise every day.  Lose weight if recommended by your caregiver.  Eat plenty of calcium and folate. Foods that are rich in calcium include milk, cheese, and broccoli. Foods that are rich in folate include chickpeas, kidney beans, and spinach. HOME CARE INSTRUCTIONS Keep all follow-up appointments as directed by your caregiver. You may need periodic exams to check for polyps. SEEK MEDICAL CARE IF: You notice bleeding during a bowel movement.   This information is not intended to replace advice given to you by your health care provider. Make sure you discuss any questions you have with your health care provider.   Document Released: 12/31/2003 Document Revised: 04/26/2014 Document Reviewed: 06/15/2011 Elsevier Interactive Patient Education [employer].

## 2016-01-01 NOTE — H&P (Signed)
Garrett Munoz is an 70 y.o. male.   Chief Complaint: Garrett Munoz is here for colonoscopy. HPI: Garrett Munoz is 70 year old Caucasian male whose last colonoscopy was normal in October 2005 was noted having positive stool which was followed by Cologard test and it was reportedly positive. Garrett Munoz denies melena or rectal bleeding. He has history of A. fib and is on anticoagulant which is on hold. Family history is negative for CRC.  Past Medical History:  Diagnosis Date  . Bronchitis    hx  . Cataract    right eye  . DDD (degenerative disc disease), lumbosacral   . Dysrhythmia    irregular heatbeat  . Eye worm    right eye  . Gout   . Left knee DJD   . Pneumonia    hx  . Radicular syndrome of left leg   . S/P total knee replacement    right  . Sleep apnea    can't use sleep study 10 yrs ago  . 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     Past Surgical History:  Procedure Laterality Date  . CARDIOVERSION N/A 03/04/2015   Procedure: CARDIOVERSION;  Surgeon: [name], MD;  Location: [place];  Service: Cardiovascular;  Laterality: N/A;  . CHOLECYSTECTOMY    . EYE EXAMINATION UNDER ANESTHESIA W/ RETINAL CRYOTHERAPY AND RETINAL LASER  07/2011  . EYE SURGERY  02/2011   cat rt  . KNEE ARTHROSCOPY     bilateral  . RETINAL DETACHMENT SURGERY  03/2011   rt  . TOTAL KNEE ARTHROPLASTY  2003   rt  . TOTAL KNEE ARTHROPLASTY  02/07/2012   Procedure: TOTAL KNEE ARTHROPLASTY;  Surgeon: [name], MD;  Location: [place];  Service: Orthopedics;  Laterality: Left;    Family History  Problem Relation Age of Onset  . Lung cancer Father   . Lung cancer Sister    Social History:  reports that he quit smoking about 23 years ago. His smoking use included Cigarettes. He has a 30.00 pack-year smoking history. He has never used smokeless tobacco. He reports that he drinks alcohol. He reports that he does not use drugs.  Allergies: No Known Allergies  Medications Prior to Admission  Medication Sig  Dispense Refill  . allopurinol (ZYLOPRIM) 300 MG tablet Take 300 mg by mouth daily.    [name] ALPRAZolam (XANAX) 1 MG tablet Take 1 mg by mouth at bedtime.   2  . carvedilol (COREG) 12.5 MG tablet Take 1 tablet (12.5 mg total) by mouth 2 (two) times daily. 180 tablet 3  . fish oil-omega-3 fatty acids 1000 MG capsule Take 1 g by mouth daily.    . magnesium oxide (MAG-OX) 400 MG tablet Take 400 mg by mouth daily.    . temazepam (RESTORIL) 30 MG capsule Take 30 mg by mouth at bedtime.    [name] acetaminophen (TYLENOL) 325 MG tablet Take 2 tablets (650 mg total) by mouth every 6 (six) hours as needed (or Fever >/= 101). (Garrett Munoz taking differently: Take 650 mg by mouth every 6 (six) hours as needed for mild pain (or Fever >/= 101). )    . apixaban (ELIQUIS) 5 MG TABS tablet Take 1 tablet (5 mg total) by mouth 2 (two) times daily. 180 tablet 3  . Misc Natural Products (BLACK CHERRY CONCENTRATE PO) Take 1 tablet by mouth daily.      No results found for this or any previous visit (from the past 48 hour(s)). No results found.  ROS  Blood  pressure 127/77, pulse 83, temperature 97.4 F (36.3 C), temperature source Oral, resp. rate 14, height 5' 10.5" (1.791 m), weight 298 lb (135.2 kg), SpO2 97 %. Physical Exam  Constitutional: He appears well-developed and well-nourished.  HENT:  Mouth/Throat: Oropharynx is clear and moist.  Eyes: Conjunctivae are normal. No scleral icterus.  Neck: No thyromegaly present.  Cardiovascular:  Irregular rhythm normal S1 and S2. No murmur or gallop noted.  Respiratory: Effort normal and breath sounds normal.  GI:  Abdomen is full but soft and nontender without organomegaly or masses.  Musculoskeletal: He exhibits no edema.  Lymphadenopathy:    He has no cervical adenopathy.  Neurological: He is alert.  Skin: Skin is warm and dry.     Assessment/Plan Heme positive stool. Positive: Cologard test. Diagnostic colonoscopy.  Hildred Laser, MD 01/01/2016, 1:44 PM

## 2016-01-01 NOTE — Op Note (Signed)
[facility] Patient Name: Garrett Munoz Procedure Date: 01/01/2016 1:36 PM MRN: [id number] Date of Birth: [birth year] Attending MD: Hildred Laser , MD CSN: [id number] Age: 70 Admit Type: Outpatient Procedure:                Colonoscopy Indications:              Heme positive stool, Positive Cologuard test Providers:                Hildred Laser, MD, Otis Peak B. Gwenlyn Perking RN, RN, Sherlyn Lees, Technician Referring MD:              Medicines:                Meperidine 50 mg IV, Midazolam 7 mg IV, Glucagon 1                            mg IV Complications:            No immediate complications. Estimated Blood Loss:     Estimated blood loss was minimal. Procedure:                Pre-Anesthesia Assessment:                           - Prior to the procedure, a History and Physical                            was performed, and patient medications and                            allergies were reviewed. The patient's tolerance of                            previous anesthesia was also reviewed. The risks                            and benefits of the procedure and the sedation                            options and risks were discussed with the patient.                            All questions were answered, and informed consent                            was obtained. Prior Anticoagulants: The patient                            last took Eliquis (apixaban) 2 days prior to the                            procedure. ASA Grade Assessment: III - A patient  with severe systemic disease. After reviewing the                            risks and benefits, the patient was deemed in                            satisfactory condition to undergo the procedure.                           After obtaining informed consent, the colonoscope                            was passed under direct vision. Throughout the                            procedure, the  patient's blood pressure, pulse, and                            oxygen saturations were monitored continuously. The                            [id number]) scope was introduced through                            the anus and advanced to the the cecum, identified                            by appendiceal orifice and ileocecal valve. The                            colonoscopy was performed without difficulty. The                            patient tolerated the procedure well. The quality                            of the bowel preparation was adequate to identify                            polyps 6 mm and larger in size. The ileocecal                            valve, appendiceal orifice, and rectum were                            photographed. Scope In: 1:54:36 PM Scope Out: 2:49:29 PM Scope Withdrawal Time: 0 hours 51 minutes 30 seconds  Total Procedure Duration: 0 hours 54 minutes 53 seconds  Findings:      Three sessile polyps were found in the descending colon, transverse       colon and ascending colon. The polyps were 4 to 6 mm in size. These       polyps were removed with a cold snare. Resection and retrieval were       complete.  A 10 mm polyp was found in the transverse colon. The polyp was sessile.       The polyp was removed with a hot snare. Resection and retrieval were       complete. To prevent bleeding after the polypectomy, one hemostatic clip       was successfully placed (MR conditional). There was no bleeding at the       end of the procedure.      A 11 mm polyp was found in the descending colon. The polyp was sessile.       Area was successfully injected with 3 mL saline for lesion assessment,       and this injection appeared to lift the lesion adequately.      A 9 mm polyp was found in the descending colon. The polyp was sessile.       The polyp was removed with a hot snare. Resection and retrieval were       complete. To prevent bleeding  post-intervention, one hemostatic clip was       successfully placed (MR conditional). There was no bleeding at the end       of the procedure.      A few small-mouthed diverticula were found in the sigmoid colon.      The retroflexed view of the distal rectum and anal verge was normal and       showed no anal or rectal abnormalities. Impression:               - Three 4 to 6 mm polyps in the descending colon,                            in the transverse colon and in the ascending colon,                            removed with a cold snare. Resected and retrieved.                           - One 10 mm polyp in the transverse colon, removed                            with a hot snare. Resected and retrieved. Clip (MR                            conditional) was placed.                           - One 11 mm polyp in the descending colon. Injected.                           - One 9 mm polyp in the descending colon, removed                            with a hot snare. Resected and retrieved. Clip (MR                            conditional) was placed.                           -  Diverticulosis in the sigmoid colon.                           Comment; all in all 6 polyps were removed.                           Exam was suboptimal because of poor prep and                            spastic colon. Moderate Sedation:      Moderate (conscious) sedation was administered by the endoscopy nurse       and supervised by the endoscopist. The following parameters were       monitored: oxygen saturation, heart rate, blood pressure, CO2       capnography and response to care. Total physician intraservice time was       60 minutes. Recommendation:           - Patient has a contact number available for                            emergencies. The signs and symptoms of potential                            delayed complications were discussed with the                            patient. Return to normal  activities tomorrow.                            Written discharge instructions were provided to the                            patient.                           - High fiber diet today.                           - Continue present medications.                           - Resume Eliquis (apixaban) at prior dose in 4 days.                           - Await pathology results.                           - Repeat colonoscopy in 1 year for surveillance. Procedure Code(s):        --- Professional ---                           [phone], Colonoscopy, flexible; with removal of                            tumor(s), polyp(s), or other lesion(s) by snare  technique                           [id number], Colonoscopy, flexible; with directed                            submucosal injection(s), any substance                           99152, Moderate sedation services provided by the                            same physician or other qualified health care                            professional performing the diagnostic or                            therapeutic service that the sedation supports,                            requiring the presence of an independent trained                            observer to assist in the monitoring of the                            patient's level of consciousness and physiological                            status; initial 15 minutes of intraservice time,                            patient age 39 years or older                           [phone], Moderate sedation services; each additional                            15 minutes intraservice time                           99153, Moderate sedation services; each additional                            15 minutes intraservice time                           99153, Moderate sedation services; each additional                            15 minutes intraservice time Diagnosis Code(s):        --- Professional ---                            D12.4, Benign neoplasm of descending colon  D12.3, Benign neoplasm of transverse colon (hepatic                            flexure or splenic flexure)                           D12.2, Benign neoplasm of ascending colon                           R19.5, Other fecal abnormalities                           K57.30, Diverticulosis of large intestine without                            perforation or abscess without bleeding CPT copyright 2016 [employer]. All rights reserved. The codes documented in this report are preliminary and upon coder review may  be revised to meet current compliance requirements. Hildred Laser, MD Hildred Laser, MD 01/01/2016 3:09:53 PM This report has been signed electronically. Number of Addenda: 0

## 2016-01-02 DIAGNOSIS — Z23 Encounter for immunization: Secondary | ICD-10-CM | POA: Diagnosis not present

## 2016-01-07 MED FILL — ALPRAZolam 1 MG TABS: 1 | 90 days supply | Qty: 270 | Fill #0

## 2016-01-07 MED FILL — TEMAZEPAM 30 MG CAPSULE: 30 | 90 days supply | Qty: 90 | Fill #0

## 2016-01-07 MED FILL — ELIQUIS 5 MG TABLET: 5 | 90 days supply | Qty: 180 | Fill #1

## 2016-01-07 MED FILL — ALLOPURINOL 300 MG TABLET: 300 | 90 days supply | Qty: 90 | Fill #0

## 2016-01-20 ENCOUNTER — Encounter (HOSPITAL_COMMUNITY): Payer: Self-pay | Admitting: Internal Medicine

## 2016-02-11 ENCOUNTER — Encounter: Payer: Self-pay | Admitting: *Deleted

## 2016-02-12 DIAGNOSIS — M25541 Pain in joints of right hand: Secondary | ICD-10-CM | POA: Diagnosis not present

## 2016-02-12 DIAGNOSIS — G894 Chronic pain syndrome: Secondary | ICD-10-CM | POA: Diagnosis not present

## 2016-02-12 DIAGNOSIS — M150 Primary generalized (osteo)arthritis: Secondary | ICD-10-CM | POA: Diagnosis not present

## 2016-02-12 DIAGNOSIS — Z79891 Long term (current) use of opiate analgesic: Secondary | ICD-10-CM | POA: Diagnosis not present

## 2016-03-31 MED FILL — CARVEDILOL 12.5 MG TABLET: 12.5 | 90 days supply | Qty: 180 | Fill #1

## 2016-03-31 MED FILL — ALLOPURINOL 300 MG TABLET: 300 | 90 days supply | Qty: 90 | Fill #1

## 2016-04-05 MED FILL — TEMAZEPAM 30 MG CAPSULE: 30 | 90 days supply | Qty: 90 | Fill #0

## 2016-04-05 MED FILL — ALPRAZolam 1 MG TABS: 1 | 90 days supply | Qty: 270 | Fill #0

## 2016-04-23 ENCOUNTER — Ambulatory Visit (INDEPENDENT_AMBULATORY_CARE_PROVIDER_SITE_OTHER): Payer: PPO | Admitting: Family Medicine

## 2016-04-23 ENCOUNTER — Encounter: Payer: Self-pay | Admitting: Family Medicine

## 2016-04-23 VITALS — BP 128/90 | HR 84 | Temp 97.6°F | Resp 18 | Ht 70.5 in | Wt 302.0 lb

## 2016-04-23 DIAGNOSIS — I481 Persistent atrial fibrillation: Secondary | ICD-10-CM

## 2016-04-23 DIAGNOSIS — R29898 Other symptoms and signs involving the musculoskeletal system: Secondary | ICD-10-CM

## 2016-04-23 DIAGNOSIS — I4819 Other persistent atrial fibrillation: Secondary | ICD-10-CM

## 2016-04-23 DIAGNOSIS — M5137 Other intervertebral disc degeneration, lumbosacral region: Secondary | ICD-10-CM

## 2016-04-23 MED ORDER — GABAPENTIN 300 MG PO CAPS: 300.0000 mg | ORAL_CAPSULE | Freq: Three times a day (TID) | ORAL | 3 refills | Status: DC

## 2016-04-23 MED FILL — GABAPENTIN 300 MG CAPSULE: 300 | 30 days supply | Qty: 90 | Fill #0

## 2016-04-23 NOTE — Progress Notes (Signed)
Subjective:    Patient ID: Garrett Munoz, male    DOB: 01-Sep-1945, 71 y.o.   MRN: [id number]  HPI Patient is a 71 year old white male here today to establish care. His previous physician is suffering from medical problems and therefore the patient needed to see a new primary care provider. History is significant for atrial fibrillation. He is currently managed by Dr. Claiborne Munoz. He is on carvedilol for rate control and Eliquis for stroke prophylaxis. 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 He had an MRI performed in 2011. I have copied the results of this and included it below for my reference: 1.  Persistent and slightly progressive central and right paracentral disc protrusion at L4-5 with mass effect on the thecal sac and likely affecting the right L5 nerve root. 2.  Advanced disc disease at L5-S1 with an annular rent and a central disc protrusion contacting the thecal sac and possibly irritating both S1 nerve roots.  This appears relatively stable.  Patient reports bilateral sciatica at times burning and stinging neuropathic pain in both legs. He was previously seen Dr. Hardin Munoz for pain control. Per the patient's report he is not a candidate for epidural steroid injections due to the fact he is on anticoagulation for his atrial fibrillation. He is also a poor surgical candidate and also does not want surgery. At the present time he is taking nothing for pain. He is on chronic benzodiazepines for insomnia. He is taking Xanax 1 mg by mouth daily at bedtime. This was recently decreased from 3 times a day. He is also on temazepam 30 mg at night. Without these medications he is unable to sleep. Certainly this puts him at risk for respiratory depression were he to combine this with a narcotic for pain control. He is asking for something a day to help with the pain in his back and the neuropathic pain in his legs. Past Medical History:  Diagnosis Date  .  Bronchitis    hx  . Cataract    right eye  . DDD (degenerative disc disease), lumbosacral   . Dysrhythmia    irregular heatbeat  . Eye worm    right eye  . Gout   . Left knee DJD   . Pneumonia    hx  . Radicular syndrome of left leg   . S/P total knee replacement    right  . Sleep apnea    can't use sleep study 10 yrs ago  . 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    Past Surgical History:  Procedure Laterality Date  . CARDIOVERSION N/A 03/04/2015   Procedure: CARDIOVERSION;  Surgeon: Garrett Sine, MD;  Location: [place];  Service: Cardiovascular;  Laterality: N/A;  . CHOLECYSTECTOMY    . COLONOSCOPY N/A 01/01/2016   Procedure: COLONOSCOPY;  Surgeon: Garrett Houston, MD;  [facility];  Service: Endoscopy;  Laterality: N/A;  1:45  . EYE EXAMINATION UNDER ANESTHESIA W/ RETINAL CRYOTHERAPY AND RETINAL LASER  07/2011  . EYE SURGERY  02/2011   cat rt  . KNEE ARTHROSCOPY     bilateral  . POLYPECTOMY  01/01/2016   Procedure: POLYPECTOMY;  Surgeon: Garrett Houston, MD;  [facility];  Service: Endoscopy;;  colon  . RETINAL DETACHMENT SURGERY  03/2011   rt  . TOTAL KNEE ARTHROPLASTY  2003   rt  . TOTAL KNEE ARTHROPLASTY  02/07/2012   Procedure: TOTAL KNEE ARTHROPLASTY;  Surgeon: Garrett Camel  Noemi Chapel, MD;  Location: [place];  Service: Orthopedics;  Laterality: Left;   Current Outpatient Prescriptions on File Prior to Visit  Medication Sig Dispense Refill  . acetaminophen (TYLENOL) 325 MG tablet Take 2 tablets (650 mg total) by mouth every 6 (six) hours as needed (or Fever >/= 101). (Patient taking differently: Take 650 mg by mouth every 6 (six) hours as needed for mild pain (or Fever >/= 101). )    . allopurinol (ZYLOPRIM) 300 MG tablet Take 300 mg by mouth daily.    [name] ALPRAZolam (XANAX) 1 MG tablet Take 1 mg by mouth at bedtime.   2  . apixaban (ELIQUIS) 5 MG TABS tablet Take 1 tablet (5 mg total) by mouth 2 (two) times daily. 180 tablet 3  . carvedilol (COREG) 12.5 MG  tablet Take 1 tablet (12.5 mg total) by mouth 2 (two) times daily. 180 tablet 3  . fish oil-omega-3 fatty acids 1000 MG capsule Take 1 g by mouth daily.    . magnesium oxide (MAG-OX) 400 MG tablet Take 400 mg by mouth daily.    . Misc Natural Products (BLACK CHERRY CONCENTRATE PO) Take 1 tablet by mouth daily.    . temazepam (RESTORIL) 30 MG capsule Take 30 mg by mouth at bedtime.     No current facility-administered medications on file prior to visit.    No Known Allergies Social History   Social History  . Marital status: Married    Spouse name: N/A  . Number of children: N/A  . Years of education: N/A   Occupational History  . Not on file.   Social History Main Topics  . Smoking status: Former Smoker    Packs/day: 2.00    Years: 15.00    Types: Cigarettes    Quit date: 02/02/1992  . Smokeless tobacco: Never Used     Comment: occ alcohol  . Alcohol use 0.0 oz/week     Comment: occasional  . Drug use: No  . Sexual activity: Not on file   Other Topics Concern  . Not on file   Social History Narrative  . No narrative on file      Review of Systems  All other systems reviewed and are negative.      Objective:   Physical Exam  Constitutional: He appears well-developed and well-nourished. No distress.  HENT:  Right Ear: External ear normal.  Left Ear: External ear normal.  Nose: Nose normal.  Mouth/Throat: Oropharynx is clear and moist. No oropharyngeal exudate.  Neck: Neck supple. No JVD present.  Cardiovascular: Normal rate and normal heart sounds.  An irregularly irregular rhythm present.  Pulmonary/Chest: Effort normal and breath sounds normal. He has no wheezes. He has no rales. He exhibits no tenderness.  Abdominal: Soft. Bowel sounds are normal.  Musculoskeletal:       Lumbar back: He exhibits decreased range of motion, tenderness and pain. He exhibits no deformity.  Lymphadenopathy:    He has no cervical adenopathy.  Skin: He is not diaphoretic.    Vitals reviewed.         Assessment & Plan:  DDD (degenerative disc disease), lumbosacral - Plan: gabapentin (NEURONTIN) 300 MG capsule  Persistent atrial fibrillation (HCC)  Weakness of left leg  Patient is appropriately anticoagulated. His rate is controlled. I reviewed his lab work from October which is significant for well-controlled cholesterol but prediabetes with a blood sugar of 118. We discussed low carbohydrate diets and I would like to recheck his blood sugar including  a hemoglobin A1c in April as he is a prediabetic. Immunizations are up-to-date. He has had Pneumovax 23, Prevnar 13, the shingles vaccine and his flu shot. He is due for a tetanus shot and hepatitis C screening but he declines this at the present time. His colonoscopy was performed this year and is up-to-date. I will try the patient on gabapentin 300 mg by mouth daily at bedtime. He can titrate himself up to 300 mg by mouth 3 times a day for radicular pain and left leg as well as neuropathic pain due to the degenerative disc disease in his lumbar spine. Recheck in April or as needed I am willing to continue the patient on Xanax and temazepam at night. He has been on this medication for many years. I would like to try to wean him down to at least one benzodiazepine if possible.

## 2016-06-21 ENCOUNTER — Telehealth: Payer: Self-pay | Admitting: Family Medicine

## 2016-06-21 ENCOUNTER — Ambulatory Visit (INDEPENDENT_AMBULATORY_CARE_PROVIDER_SITE_OTHER): Payer: PPO | Admitting: Cardiovascular Disease

## 2016-06-21 ENCOUNTER — Encounter: Payer: Self-pay | Admitting: Cardiovascular Disease

## 2016-06-21 VITALS — BP 132/84 | HR 72 | Ht 71.0 in | Wt 302.0 lb

## 2016-06-21 DIAGNOSIS — I482 Chronic atrial fibrillation: Secondary | ICD-10-CM | POA: Diagnosis not present

## 2016-06-21 DIAGNOSIS — I4821 Permanent atrial fibrillation: Secondary | ICD-10-CM

## 2016-06-21 DIAGNOSIS — I10 Essential (primary) hypertension: Secondary | ICD-10-CM

## 2016-06-21 DIAGNOSIS — Z7901 Long term (current) use of anticoagulants: Secondary | ICD-10-CM | POA: Diagnosis not present

## 2016-06-21 DIAGNOSIS — G4733 Obstructive sleep apnea (adult) (pediatric): Secondary | ICD-10-CM

## 2016-06-21 MED ORDER — CARVEDILOL 12.5 MG PO TABS: ORAL_TABLET | ORAL | 3 refills | Status: DC

## 2016-06-21 MED ORDER — APIXABAN 5 MG PO TABS: 5.0000 mg | ORAL_TABLET | Freq: Two times a day (BID) | ORAL | 3 refills | Status: DC

## 2016-06-21 MED FILL — CARVEDILOL 12.5 MG TABLET: 12.5 | 90 days supply | Qty: 225 | Fill #0

## 2016-06-21 MED FILL — ELIQUIS 5 MG TABLET: 5 | 90 days supply | Qty: 180 | Fill #0

## 2016-06-21 NOTE — Telephone Encounter (Signed)
ok 

## 2016-06-21 NOTE — Telephone Encounter (Signed)
Patient is asking for refills on his allopurinol and temazepam to be called into [facility] if possible  sb [phone]

## 2016-06-21 NOTE — Progress Notes (Signed)
Patient ID: Garrett Munoz, male   DOB: 05/24/1945, 71 y.o.   MRN: [id number]     HPI: Garrett Munoz is a 71 y.o. male who presents to the office today for a 6 month follow-up evaluation.  Garrett Munoz has a history of hypertension, and remotely had taken blood pressure medications for several years but none since the last 20 years. He has a history of prior hypertension, obstructive sleep apnea, untreated due to previous intolerance to full face mask, as well as obesity.  He admits to having an intermittent irregular rhythm.  Last autumn he was scheduled to undergo elective surgery on his right foot hammertoe by Dr. Fritzi Mandes.  Surgery was canceled due to concerns for possible Mobitz type II block with possible atrial flutter.  He was seen by me for preoperative evaluation on 01/30/2015.  At that time, his ECG demonstrated atrial fibrillation with ventricular rate at approximately 100 bpm.  He was noted have small Q wave in lead 3.  He was started on Toprol 25 mg for 4 days and this was titrated up to 50 mg.  He also was started on eloquence 5 mg twice a day for anticoagulation.  A 2-D echo Doppler study on February 18 2015 revealed an ejection fraction of 55-60%.  There was moderate left ventricular hypertrophy.  The left atrium was moderately dilated.  A nuclear perfusion study done on 02/13/2015 was low risk without ischemia with only a mild apical defect, probably attenuation. He denies any chest pain.  He denies shortness of breath.  He is been unable to walk secondary to his toe abnormality.  He admits to daytime sleepiness and snoring.  He had not utilized CPAP therapy in years.  He is status post cataract surgery with lens implant.     He underwent cardioversion on March 04, 2015 and was successfully converted back to sinus rhythm.  On 04/10/2015 he was admitted with community-acquired pneumonia and was found to be back in atrial fibrillation.  He denies any episodes of chest pain. He has been on  eliquis 5 mg twice a day for anticoagulation , carvedilol 6.25 mg twice a day.  He is unaware of his rhythm being abnormal. He is not sleeping well but has not been using his CPAP. He feels fatigued.  When I saw him in March 2017 he continued to be in atrial fibrillation.  After much discussion, concerning another attempt at trying to convert into sinus rhythm versus staying in permanent atrial fibrillation he opted to stay in permanent atrial fibrillation.  He has continued to be on anticoagulation therapy.  Since I last saw him,  he denies chest pain or shortness of breath.  He admits to at least a 10 pound weight gain since July.  He is unaware of his heart racing.  He denies presyncope.  He presents for evaluation.  Past Medical History:  Diagnosis Date  . Atrial fibrillation ([place])   . Bronchitis    hx  . Cataract    right eye  . DDD (degenerative disc disease), lumbosacral   . Dysrhythmia    irregular heatbeat  . Eye worm    right eye  . Gout   . Left knee DJD   . Pneumonia    hx  . Prediabetes   . Radicular syndrome of left leg   . S/P total knee replacement    right  . Sleep apnea    can't use sleep study 10 yrs ago  . Stones  in the urinary tract     Past Surgical History:  Procedure Laterality Date  . CARDIOVERSION N/A 03/04/2015   Procedure: CARDIOVERSION;  Surgeon: [name], MD;  Location: [facility];  Service: Cardiovascular;  Laterality: N/A;  . CHOLECYSTECTOMY    . COLONOSCOPY N/A 01/01/2016   Procedure: COLONOSCOPY;  Surgeon: [name], MD;  [facility];  Service: Endoscopy;  Laterality: N/A;  1:45  . EYE EXAMINATION UNDER ANESTHESIA W/ RETINAL CRYOTHERAPY AND RETINAL LASER  07/2011  . EYE SURGERY  02/2011   cat rt  . KNEE ARTHROSCOPY     bilateral  . POLYPECTOMY  01/01/2016   Procedure: POLYPECTOMY;  Surgeon: [name], MD;  [facility];  Service: Endoscopy;;  colon  . RETINAL DETACHMENT SURGERY  03/2011   rt  .  TOTAL KNEE ARTHROPLASTY  2003   rt  . TOTAL KNEE ARTHROPLASTY  02/07/2012   Procedure: TOTAL KNEE ARTHROPLASTY;  Surgeon: [name], MD;  Location: [facility];  Service: Orthopedics;  Laterality: Left;    No Known Allergies  Current Outpatient Prescriptions  Medication Sig Dispense Refill  . acetaminophen (TYLENOL) 325 MG tablet Take 2 tablets (650 mg total) by mouth every 6 (six) hours as needed (or Fever >/= 101). (Patient taking differently: Take 650 mg by mouth every 6 (six) hours as needed for mild pain (or Fever >/= 101). )    . apixaban (ELIQUIS) 5 MG TABS tablet Take 1 tablet (5 mg total) by mouth 2 (two) times daily. 180 tablet 3  . carvedilol (COREG) 12.5 MG tablet Take 1.5 tablet in the morning and 1 tablet in the evening 225 tablet 3  . fish oil-omega-3 fatty acids 1000 MG capsule Take 1 g by mouth daily.    [name] gabapentin (NEURONTIN) 300 MG capsule Take 1 capsule (300 mg total) by mouth 3 (three) times daily. 90 capsule 3  . magnesium oxide (MAG-OX) 400 MG tablet Take 400 mg by mouth daily.    . Misc Natural Products (BLACK CHERRY CONCENTRATE PO) Take 1 tablet by mouth daily.    [name] allopurinol (ZYLOPRIM) 300 MG tablet Take 1 tablet (300 mg total) by mouth daily. 90 tablet 1  . temazepam (RESTORIL) 30 MG capsule Take 1 capsule (30 mg total) by mouth at bedtime. 30 capsule 2   No current facility-administered medications for this visit.     Social History   Social History  . Marital status: Married    Spouse name: N/A  . Number of children: N/A  . Years of education: N/A   Occupational History  . Not on file.   Social History Main Topics  . Smoking status: Former Smoker    Packs/day: 2.00    Years: 15.00    Types: Cigarettes    Quit date: 02/02/1992  . Smokeless tobacco: Never Used     Comment: occ alcohol  . Alcohol use 0.0 oz/week     Comment: occasional  . Drug use: No  . Sexual activity: Not on file   Other Topics Concern  . Not on file   Social History  Narrative  . No narrative on file   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  He is married.   Family History  Problem Relation Age of Onset  . Lung cancer Father   . Lung cancer Sister    Family history is notable that his father died at  45 with lung CA and his mother died at 50.  She had an irregular heart rhythm.  He has 2 sisters and one is deceased secondary to cancer and 1 brother.  ROS General: Negative; No fevers, chills, or night sweats HEENT: He has a right eye lens  implant after cataract surgery No changes in vision or hearing, sinus congestion, difficulty swallowing Pulmonary: Negative; No cough, wheezing, shortness of breath, hemoptysis Cardiovascular: See HPI:  GI: Negative; No nausea, vomiting, diarrhea, or abdominal pain GU: Negative; No dysuria, hematuria, or difficulty voiding Musculoskeletal: Negative; no myalgias, joint pain, or weakness Hematologic: Negative; no easy bruising, bleeding Endocrine: Negative; no heat/cold intolerance; no diabetes, Neuro: Negative; no changes in balance, headaches Skin: Negative; No rashes or skin lesions Psychiatric: Negative; No behavioral problems, depression Sleep: Positive for untreated sleep apnea; positive for snoring and hypersomnolence. Other comprehensive 14 point system review is negative   Physical Exam BP 132/84   Pulse 72   Ht '5\' 11"'  (1.803 m)   Wt (!) 302 lb (137 kg)   BMI 42.12 kg/m   Repeat blood pressure 128/82  Wt Readings from Last 3 Encounters:  06/21/16 (!) 302 lb (137 kg)  04/23/16 (!) 302 lb (137 kg)  01/01/16 298 lb (135.2 kg)   General: Alert, oriented, no distress.  Skin: normal turgor, no rashes, warm and dry HEENT: Normocephalic, atraumatic. Pupils equal round and reactive to light; sclera anicteric; extraocular muscles intact, No lid lag; Nose without nasal septal hypertrophy; Mouth/Parynx benign; Mallinpatti scale 3 Neck: No JVD, no carotid  bruits; normal carotid upstroke Lungs: clear to ausculatation and percussion bilaterally; no wheezing or rales, normal inspiratory and expiratory effort Chest wall: without tenderness to palpitation Heart: Irregularly irregular rhythm with a rate at approximately 80;  s1 s2 normal, 1/6 systolic murmur, No diastolic murmur, no rubs, gallops, thrills, or heaves Abdomen: Moderate central adiposity soft, nontender; no hepatosplenomehaly, BS+; abdominal aorta nontender and not dilated by palpation. Back: no CVA tenderness Pulses: 2+  Musculoskeletal: full range of motion, normal strength, no joint deformities Extremities: Pulses 2+, no clubbing cyanosis or edema, Homan's sign negative  Neurologic: grossly nonfocal; Cranial nerves grossly wnl Psychologic: Normal mood and affect  ECG (independently read by me): Atrial fibrillation at 72 bpm with PVC.  September 2017 ECG (independently read by me): Atrial fibrillation with ventricular rate at 10 2 bpm.  Occasional unifocal PVCs versus a bare complex.  March 2017 ECG (independently read by me):  Atrial fibrillation at 83 bpm.  02/21/2015 ECG (independently read by me): Atrial fibrillation at 99 bpm.  01/30/2015 ECG (independently read by me): Atrial fibrillation at a approximately 90-100 bpm.  Small Q wave in lead 3.  Early transition.  LABS:  BMP Latest Ref Rng & Units 12/05/2015 04/10/2015 02/25/2015  Glucose 65 - 99 mg/dL 118(H) 138(H) 111(H)  BUN 8 - 27 mg/dL '11 8 14  ' Creatinine 0.76 - 1.27 mg/dL 0.97 1.03 0.90  BUN/Creat Ratio 10 - 24 11 - -  Sodium 134 - 144 mmol/L 148(H) 140 141  Potassium 3.5 - 5.2 mmol/L 4.5 4.7 4.4  Chloride 96 - 106 mmol/L 107(H) 103 106  CO2 18 - 29 mmol/L '26 27 24  ' Calcium 8.6 - 10.2 mg/dL 9.3 9.2 9.1    Hepatic Function Latest Ref Rng & Units 12/05/2015 04/10/2015 01/30/2015  Total Protein 6.0 - 8.5 g/dL 6.8 6.5 6.7  Albumin 3.5 - 4.8 g/dL 4.1 3.6 4.0  AST 0 - 40 IU/L 21 29 18  ALT 0 - 44 IU/L '20 30 18  ' Alk  Phosphatase 39 - 117 IU/L 63 64 60  Total Bilirubin 0.0 - 1.2 mg/dL 0.3 0.8 0.4  Bilirubin, Direct 0.1 - 0.5 mg/dL - 0.2 -    CBC Latest Ref Rng & Units 12/05/2015 04/10/2015 02/25/2015  WBC 3.4 - 10.8 x10E3/uL 6.2 12.2(H) 6.7  Hemoglobin 13.0 - 17.0 g/dL - 15.8 16.4  Hematocrit 37.5 - 51.0 % 49.1 47.7 46.3  Platelets 150 - 379 x10E3/uL 199 166 196   Lab Results  Component Value Date   MCV 96 12/05/2015   MCV 95.2 04/10/2015   MCV 91.1 02/25/2015    Lab Results  Component Value Date   TSH 0.989 12/05/2015    BNP No results found for: BNP  ProBNP No results found for: PROBNP   Lipid Panel     Component Value Date/Time   CHOL 147 12/05/2015 0901   TRIG 81 12/05/2015 0901   HDL 55 12/05/2015 0901   CHOLHDL 2.8 01/30/2015 0920   VLDL 27 01/30/2015 0920   LDLCALC 76 12/05/2015 0901     RADIOLOGY: No results found.  IMPRESSION:  1. Essential hypertension   2. Permanent atrial fibrillation ([place])   3. OSA (obstructive sleep apnea)   4. Morbid obesity, unspecified obesity type ([place])   5. Chronic anticoagulation - Eliquis, CHADS2VASC=2     ASSESSMENT AND PLAN: Garrett Munoz is a 71 year old gentleman who has a history of morbid obesity with a body mass index of 42.12, a remote history of hypertension and a history of obstructive sleep apnea, untreated for many years.  When I saw him in November 2016, his ECG revealed atrial fibrillation of questionable duration.  He was on Eliquis anticoagulation and subsequently underwent successful cardioversion with restoration of sinus rhythm. An echo revealed moderate LVH with normal systolic function with an EF of 55-60%.  He had moderate dilatation of his left atrium.  In retrospect, he does not recall that he felt any different being in sinus rhythm. He developed an episode of pneumonia and was found to be back in atrial fibrillation in December 2016. He is now in permanent atrial fibrillation and continues to be on eliquise for  anticoagulation.  I last saw him, his ventricular rate was around 100 and carvedilol was titrated to 12.5 mg twice a day.  His blood pressure today is mildly increased, based on new guideline criteria.  I have suggested a slight additional titration of carvedilol such that he will take 18.75 mg in the morning and continue to take the 12.5 mg at night.  He continues to take allopurinol.  There are no recent gout symptoms.  Of weight loss.  He is morbidly obese.  Undoubtedly this is contributing to his sleep apnea, which is untreated and in the past.  He did not.  Lab in August 2017 showed an LDL cholesterol 76 with total cholesterol 147.  Discussed importance of increased exercise at least 5 days per week for 30 minutes if at all possible.  I will see him in 6 months for cardiology reevaluation  Time spent 25 minutes [name], MD, [facility]  06/23/2016 7:30 PM

## 2016-06-21 NOTE — Patient Instructions (Signed)
Your physician has recommended you make the following change in your medication:   1.) the carvedilol has been changed to 1.5 tablets in the  Morning an 1 tablet in the evening. A new prescription has been sent to your pharmacy to reflect this change.  Your physician wants you to follow-up in: 6 months or sooner if needed. You will receive a reminder letter in the mail two months in advance. If you don't receive a letter, please call our office to schedule the follow-up appointment.  If you need a refill on your cardiac medications before your next appointment, please call your pharmacy.

## 2016-06-21 NOTE — Telephone Encounter (Signed)
Ok to refill 

## 2016-06-22 MED ORDER — TEMAZEPAM 30 MG PO CAPS: 30.0000 mg | ORAL_CAPSULE | Freq: Every day | ORAL | 2 refills | Status: DC

## 2016-06-22 MED ORDER — ALLOPURINOL 300 MG PO TABS
300.0000 mg | ORAL_TABLET | Freq: Every day | ORAL | 1 refills | Status: DC
Start: 2016-06-22 — End: 2016-10-01

## 2016-06-22 MED FILL — ALLOPURINOL 300 MG TABLET: 300 | 90 days supply | Qty: 90 | Fill #0

## 2016-06-22 NOTE — Telephone Encounter (Signed)
Medication called/sent to requested pharmacy  

## 2016-06-24 ENCOUNTER — Encounter: Payer: Self-pay | Admitting: Family Medicine

## 2016-07-01 MED FILL — TEMAZEPAM 30 MG CAPSULE: 30 | 30 days supply | Qty: 30 | Fill #0

## 2016-07-23 ENCOUNTER — Other Ambulatory Visit: Payer: PPO

## 2016-07-23 ENCOUNTER — Other Ambulatory Visit: Payer: Self-pay | Admitting: Family Medicine

## 2016-07-23 DIAGNOSIS — I4891 Unspecified atrial fibrillation: Secondary | ICD-10-CM | POA: Diagnosis not present

## 2016-07-23 DIAGNOSIS — Z79899 Other long term (current) drug therapy: Secondary | ICD-10-CM | POA: Diagnosis not present

## 2016-07-23 DIAGNOSIS — I10 Essential (primary) hypertension: Secondary | ICD-10-CM

## 2016-07-23 DIAGNOSIS — R7303 Prediabetes: Secondary | ICD-10-CM | POA: Diagnosis not present

## 2016-07-23 LAB — LIPID PANEL
CHOL/HDL RATIO: 2.7 ratio (ref <5.0)
CHOLESTEROL: 134 mg/dL (ref <200)
HDL: 50 mg/dL (ref >40)
LDL Cholesterol: 63 mg/dL (ref <100)
TRIGLYCERIDES: 103 mg/dL (ref <150)
VLDL: 21 mg/dL (ref <30)

## 2016-07-23 LAB — CBC WITH DIFFERENTIAL/PLATELET
Basophils Absolute: 0 cells/uL (ref 0–200)
Basophils Relative: 0 %
EOS ABS: 240 {cells}/uL (ref 15–500)
Eosinophils Relative: 4 %
HEMATOCRIT: 47.6 % (ref 38.5–50.0)
HEMOGLOBIN: 16.2 g/dL (ref 13.0–17.0)
LYMPHS ABS: 1920 {cells}/uL (ref 850–3900)
LYMPHS PCT: 32 %
MCH: 32.2 pg (ref 27.0–33.0)
MCHC: 34 g/dL (ref 32.0–36.0)
MCV: 94.6 fL (ref 80.0–100.0)
MONO ABS: 540 {cells}/uL (ref 200–950)
MPV: 10.7 fL (ref 7.5–12.5)
Monocytes Relative: 9 %
NEUTROS PCT: 55 %
Neutro Abs: 3300 cells/uL (ref 1500–7800)
Platelets: 173 10*3/uL (ref 140–400)
RBC: 5.03 MIL/uL (ref 4.20–5.80)
RDW: 14.6 % (ref 11.0–15.0)
WBC: 6 10*3/uL (ref 3.8–10.8)

## 2016-07-23 LAB — COMPLETE METABOLIC PANEL WITH GFR
ALBUMIN: 3.8 g/dL (ref 3.6–5.1)
ALK PHOS: 58 U/L (ref 40–115)
ALT: 20 U/L (ref 9–46)
AST: 18 U/L (ref 10–35)
BUN: 11 mg/dL (ref 7–25)
CALCIUM: 9.1 mg/dL (ref 8.6–10.3)
CHLORIDE: 103 mmol/L (ref 98–110)
CO2: 31 mmol/L (ref 20–31)
Creat: 0.98 mg/dL (ref 0.70–1.18)
GFR, EST AFRICAN AMERICAN: 89 mL/min (ref >=60)
GFR, EST NON AFRICAN AMERICAN: 77 mL/min (ref >=60)
Glucose, Bld: 107 mg/dL — ABNORMAL HIGH (ref 70–99)
POTASSIUM: 4.6 mmol/L (ref 3.5–5.3)
Sodium: 141 mmol/L (ref 135–146)
Total Bilirubin: 0.5 mg/dL (ref 0.2–1.2)
Total Protein: 6.5 g/dL (ref 6.1–8.1)

## 2016-07-23 LAB — TSH: TSH: 0.95 m[IU]/L (ref 0.40–4.50)

## 2016-07-24 LAB — HEMOGLOBIN A1C
Hgb A1c MFr Bld: 5.4 % (ref <5.7)
Mean Plasma Glucose: 108 mg/dL

## 2016-07-27 ENCOUNTER — Encounter: Payer: Self-pay | Admitting: Family Medicine

## 2016-07-29 ENCOUNTER — Ambulatory Visit (INDEPENDENT_AMBULATORY_CARE_PROVIDER_SITE_OTHER): Payer: PPO | Admitting: Family Medicine

## 2016-07-29 ENCOUNTER — Encounter: Payer: Self-pay | Admitting: Family Medicine

## 2016-07-29 VITALS — BP 144/88 | HR 80 | Temp 98.4°F | Resp 20 | Ht 70.5 in | Wt 307.0 lb

## 2016-07-29 DIAGNOSIS — R7303 Prediabetes: Secondary | ICD-10-CM | POA: Diagnosis not present

## 2016-07-29 DIAGNOSIS — I4819 Other persistent atrial fibrillation: Secondary | ICD-10-CM

## 2016-07-29 DIAGNOSIS — I481 Persistent atrial fibrillation: Secondary | ICD-10-CM

## 2016-07-29 DIAGNOSIS — R5382 Chronic fatigue, unspecified: Secondary | ICD-10-CM

## 2016-07-29 MED FILL — TEMAZEPAM 30 MG CAPSULE: 30 | 30 days supply | Qty: 30 | Fill #1

## 2016-07-29 NOTE — Progress Notes (Signed)
Subjective:    Patient ID: Garrett Munoz, male    DOB: 1946/01/05, 71 y.o.   MRN: [id number]  HPI  04/2016 Patient is a 71 year old white male here today to establish care. His previous physician is suffering from medical problems and therefore the patient needed to see a new primary care provider. History is significant for atrial fibrillation. He is currently managed by Dr. Claiborne Billings. He is on carvedilol for rate control and Eliquis for stroke prophylaxis. 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 He had an MRI performed in 2011. I have copied the results of this and included it below for my reference: 1.  Persistent and slightly progressive central and right paracentral disc protrusion at L4-5 with mass effect on the thecal sac and likely affecting the right L5 nerve root. 2.  Advanced disc disease at L5-S1 with an annular rent and a central disc protrusion contacting the thecal sac and possibly irritating both S1 nerve roots.  This appears relatively stable.  Patient reports bilateral sciatica at times burning and stinging neuropathic pain in both legs. He was previously seen Dr. Hardin Negus for pain control. Per the patient's report he is not a candidate for epidural steroid injections due to the fact he is on anticoagulation for his atrial fibrillation. He is also a poor surgical candidate and also does not want surgery. At the present time he is taking nothing for pain. He is on chronic benzodiazepines for insomnia. He is taking Xanax 1 mg by mouth daily at bedtime. This was recently decreased from 3 times a day. He is also on temazepam 30 mg at night. Without these medications he is unable to sleep. Certainly this puts him at risk for respiratory depression were he to combine this with a narcotic for pain control. He is asking for something a day to help with the pain in his back and the neuropathic pain in his legs.  At that time, my plan was: Patient is  appropriately anticoagulated. His rate is controlled. I reviewed his lab work from October which is significant for well-controlled cholesterol but prediabetes with a blood sugar of 118. We discussed low carbohydrate diets and I would like to recheck his blood sugar including a hemoglobin A1c in April as he is a prediabetic. Immunizations are up-to-date. He has had Pneumovax 23, Prevnar 13, the shingles vaccine and his flu shot. He is due for a tetanus shot and hepatitis C screening but he declines this at the present time. His colonoscopy was performed this year and is up-to-date. I will try the patient on gabapentin 300 mg by mouth daily at bedtime. He can titrate himself up to 300 mg by mouth 3 times a day for radicular pain and left leg as well as neuropathic pain due to the degenerative disc disease in his lumbar spine. Recheck in April or as needed. I am willing to continue the patient on temazepam at night. He has been on this medication for many years. I would like to try to wean him down to at least one benzodiazepine if possible.  07/29/16 Appointment on 07/23/2016  Component Date Value Ref Range Status  . Sodium 07/23/2016 141  135 - 146 mmol/L Final  . Potassium 07/23/2016 4.6  3.5 - 5.3 mmol/L Final  . Chloride 07/23/2016 103  98 - 110 mmol/L Final  . CO2 07/23/2016 31  20 - 31 mmol/L Final  . Glucose, Bld 07/23/2016 107* 70 - 99 mg/dL  Final  . BUN 07/23/2016 11  7 - 25 mg/dL Final  . Creat 07/23/2016 0.98  0.70 - 1.18 mg/dL Final   Comment:   For patients > or = 73 years of age: The upper reference limit for Creatinine is approximately 13% higher for people identified as African-American.     . Total Bilirubin 07/23/2016 0.5  0.2 - 1.2 mg/dL Final  . Alkaline Phosphatase 07/23/2016 58  40 - 115 U/L Final  . AST 07/23/2016 18  10 - 35 U/L Final  . ALT 07/23/2016 20  9 - 46 U/L Final  . Total Protein 07/23/2016 6.5  6.1 - 8.1 g/dL Final  . Albumin 07/23/2016 3.8  3.6 - 5.1 g/dL  Final  . Calcium 07/23/2016 9.1  8.6 - 10.3 mg/dL Final  . GFR, Est African American 07/23/2016 89  >=60 mL/min Final  . GFR, Est Non African American 07/23/2016 77  >=60 mL/min Final  . TSH 07/23/2016 0.95  0.40 - 4.50 mIU/L Final  . Cholesterol 07/23/2016 134  <200 mg/dL Final  . Triglycerides 07/23/2016 103  <150 mg/dL Final  . HDL 07/23/2016 50  >40 mg/dL Final  . Total CHOL/HDL Ratio 07/23/2016 2.7  <5.0 Ratio Final  . VLDL 07/23/2016 21  <30 mg/dL Final  . LDL Cholesterol 07/23/2016 63  <100 mg/dL Final  . WBC 07/23/2016 6.0  3.8 - 10.8 K/uL Final  . RBC 07/23/2016 5.03  4.20 - 5.80 MIL/uL Final  . Hemoglobin 07/23/2016 16.2  13.0 - 17.0 g/dL Final  . HCT 07/23/2016 47.6  38.5 - 50.0 % Final  . MCV 07/23/2016 94.6  80.0 - 100.0 fL Final  . MCH 07/23/2016 32.2  27.0 - 33.0 pg Final  . MCHC 07/23/2016 34.0  32.0 - 36.0 g/dL Final  . RDW 07/23/2016 14.6  11.0 - 15.0 % Final  . Platelets 07/23/2016 173  140 - 400 K/uL Final  . MPV 07/23/2016 10.7  7.5 - 12.5 fL Final  . Neutro Abs 07/23/2016 3300  1,500 - 7,800 cells/uL Final  . Lymphs Abs 07/23/2016 1920  850 - 3,900 cells/uL Final  . Monocytes Absolute 07/23/2016 540  200 - 950 cells/uL Final  . Eosinophils Absolute 07/23/2016 240  15 - 500 cells/uL Final  . Basophils Absolute 07/23/2016 0  0 - 200 cells/uL Final  . Neutrophils Relative % 07/23/2016 55  % Final  . Lymphocytes Relative 07/23/2016 32  % Final  . Monocytes Relative 07/23/2016 9  % Final  . Eosinophils Relative 07/23/2016 4  % Final  . Basophils Relative 07/23/2016 0  % Final  . Smear Review 07/23/2016 Criteria for review not met   Final  . Hgb A1c MFr Bld 07/23/2016 5.4  <5.7 % Final   Comment:   For the purpose of screening for the presence of diabetes:   <5.7%       Consistent with the absence of diabetes 5.7-6.4 %   Consistent with increased risk for diabetes (prediabetes) >=6.5 %     Consistent with diabetes   This assay result is consistent with a  decreased risk of diabetes.   Currently, no consensus exists regarding use of hemoglobin A1c for diagnosis of diabetes in children.   According to American Diabetes Association (ADA) guidelines, hemoglobin A1c <7.0% represents optimal control in non-pregnant diabetic patients. Different metrics may apply to specific patient populations. Standards of Medical Care in Diabetes (ADA).     . Mean Plasma Glucose 07/23/2016 108  mg/dL Final  Here today for follow-up. Prediabetes appears stable. Hemoglobin A1c is excellent.  The remainder of the labwork is excellent.  cholesterol is well within target range. However he continues to complain of severe fatigue. Gabapentin has helped his pain. He uses sparingly as needed for neuropathic pain. He has successfully been able to discontinue Xanax as we discussed last time. He is still using temazepam to help him sleep and he still sleeps very poorly. His biggest complaint is fatigue that does not improve. He states that he has no desire to do anything. He has very little energy. He attributes it to the carvedilol however his heart rate today is 80 Past Medical History:  Diagnosis Date  . Atrial fibrillation ([place])   . Bronchitis    hx  . Cataract    right eye  . DDD (degenerative disc disease), lumbosacral   . Dysrhythmia    irregular heatbeat  . Eye worm    right eye  . Gout   . Left knee DJD   . Morbid obesity with BMI of 40.0-44.9, adult ([place])   . Pneumonia    hx  . Prediabetes   . Radicular syndrome of left leg   . S/P total knee replacement    right  . Sleep apnea    can't use sleep study 10 yrs ago  . 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    Past Surgical History:  Procedure Laterality Date  . CARDIOVERSION N/A 03/04/2015   Procedure: CARDIOVERSION;  Surgeon: [name], MD;  Location: [place];  Service: Cardiovascular;  Laterality: N/A;  . CHOLECYSTECTOMY    . COLONOSCOPY N/A 01/01/2016   Procedure: COLONOSCOPY;  Surgeon: [name], MD;  [facility];  Service: Endoscopy;  Laterality: N/A;  1:45  . EYE EXAMINATION UNDER ANESTHESIA W/ RETINAL CRYOTHERAPY AND RETINAL LASER  07/2011  . EYE SURGERY  02/2011   cat rt  . KNEE ARTHROSCOPY     bilateral  . POLYPECTOMY  01/01/2016   Procedure: POLYPECTOMY;  Surgeon: [name], MD;  [facility];  Service: Endoscopy;;  colon  . RETINAL DETACHMENT SURGERY  03/2011   rt  . TOTAL KNEE ARTHROPLASTY  2003   rt  . TOTAL KNEE ARTHROPLASTY  02/07/2012   Procedure: TOTAL KNEE ARTHROPLASTY;  Surgeon: [name], MD;  Location: [place];  Service: Orthopedics;  Laterality: Left;   Current Outpatient Prescriptions on File Prior to Visit  Medication Sig Dispense Refill  . acetaminophen (TYLENOL) 325 MG tablet Take 2 tablets (650 mg total) by mouth every 6 (six) hours as needed (or Fever >/= 101). (Patient taking differently: Take 650 mg by mouth every 6 (six) hours as needed for mild pain (or Fever >/= 101). )    . allopurinol (ZYLOPRIM) 300 MG tablet Take 1 tablet (300 mg total) by mouth daily. 90 tablet 1  . apixaban (ELIQUIS) 5 MG TABS tablet Take 1 tablet (5 mg total) by mouth 2 (two) times daily. 180 tablet 3  . carvedilol (COREG) 12.5 MG tablet Take 1.5 tablet in the morning and 1 tablet in the evening 225 tablet 3  . fish oil-omega-3 fatty acids 1000 MG capsule Take 1 g by mouth daily.    [name] gabapentin (NEURONTIN) 300 MG capsule Take 1 capsule (300 mg total) by mouth 3 (three) times daily. 90 capsule 3  . magnesium oxide (MAG-OX) 400 MG tablet Take 400 mg by mouth daily.    . Misc Natural Products (BLACK  CHERRY CONCENTRATE PO) Take 1 tablet by mouth daily.    . temazepam (RESTORIL) 30 MG capsule Take 1 capsule (30 mg total) by mouth at bedtime. 30 capsule 2   No current facility-administered medications on file prior to visit.    No Known Allergies Social History   Social History  . Marital status: Married    Spouse name: N/A  . Number of  children: N/A  . Years of education: N/A   Occupational History  . Not on file.   Social History Main Topics  . Smoking status: Former Smoker    Packs/day: 2.00    Years: 15.00    Types: Cigarettes    Quit date: 02/02/1992  . Smokeless tobacco: Never Used     Comment: occ alcohol  . Alcohol use 0.0 oz/week     Comment: occasional  . Drug use: No  . Sexual activity: Not on file   Other Topics Concern  . Not on file   Social History Narrative  . No narrative on file      Review of Systems  All other systems reviewed and are negative.      Objective:   Physical Exam  Constitutional: He appears well-developed and well-nourished. No distress.  HENT:  Right Ear: External ear normal.  Left Ear: External ear normal.  Nose: Nose normal.  Mouth/Throat: Oropharynx is clear and moist. No oropharyngeal exudate.  Neck: Neck supple. No JVD present.  Cardiovascular: Normal rate and normal heart sounds.  An irregularly irregular rhythm present.  Pulmonary/Chest: Effort normal and breath sounds normal. He has no wheezes. He has no rales. He exhibits no tenderness.  Abdominal: Soft. Bowel sounds are normal.  Musculoskeletal:       Lumbar back: He exhibits decreased range of motion, tenderness and pain. He exhibits no deformity.  Lymphadenopathy:    He has no cervical adenopathy.  Skin: He is not diaphoretic.  Vitals reviewed.         Assessment & Plan:  Chronic fatigue - Plan: Testosterone Total,Free,Bio, Males  Patient's heart rate is well controlled on carvedilol. He is appropriately anticoagulated. His prediabetes is stable and his cholesterol is outstanding. His pain is better controlled on gabapentin. I'm concerned that his fatigue could be due to hypogonadism. I believe the fatigue that he is reporting is out of proportion to what I would expect simply from a beta blocker. Therefore I'll check a free and total testosterone level. The patient may benefit from  testosterone repletion.

## 2016-08-02 LAB — TESTOSTERONE TOTAL,FREE,BIO, MALES
ALBUMIN: 3.7 g/dL (ref 3.6–5.1)
SEX HORMONE BINDING: 35 nmol/L (ref 22–77)
TESTOSTERONE FREE: 40.8 pg/mL (ref 6.0–73.0)
TESTOSTERONE: 320 ng/dL (ref 250–827)
Testosterone, Bioavailable: 69.7 ng/dL (ref 15.0–150.0)

## 2016-08-24 DIAGNOSIS — Z96659 Presence of unspecified artificial knee joint: Secondary | ICD-10-CM | POA: Insufficient documentation

## 2016-08-24 DIAGNOSIS — Z961 Presence of intraocular lens: Secondary | ICD-10-CM | POA: Diagnosis not present

## 2016-08-24 DIAGNOSIS — H25012 Cortical age-related cataract, left eye: Secondary | ICD-10-CM | POA: Insufficient documentation

## 2016-08-24 DIAGNOSIS — H524 Presbyopia: Secondary | ICD-10-CM | POA: Diagnosis not present

## 2016-08-24 DIAGNOSIS — H52203 Unspecified astigmatism, bilateral: Secondary | ICD-10-CM | POA: Diagnosis not present

## 2016-08-24 DIAGNOSIS — H2512 Age-related nuclear cataract, left eye: Secondary | ICD-10-CM | POA: Diagnosis not present

## 2016-08-24 DIAGNOSIS — M109 Gout, unspecified: Secondary | ICD-10-CM | POA: Insufficient documentation

## 2016-09-01 MED FILL — TEMAZEPAM 30 MG CAPSULE: 30 | 30 days supply | Qty: 30 | Fill #2

## 2016-09-30 ENCOUNTER — Telehealth: Payer: Self-pay | Admitting: Family Medicine

## 2016-09-30 MED FILL — ALLOPURINOL 300 MG TABLET: 300 | 90 days supply | Qty: 90 | Fill #1

## 2016-09-30 MED FILL — CARVEDILOL 12.5 MG TABLET: 12.5 | 90 days supply | Qty: 225 | Fill #1

## 2016-09-30 MED FILL — ELIQUIS 5 MG TABLET: 5 | 90 days supply | Qty: 180 | Fill #1

## 2016-09-30 NOTE — Telephone Encounter (Signed)
OK to do 90 day on Temazepam?

## 2016-09-30 NOTE — Telephone Encounter (Signed)
PATIENT NEEDS 90 DAY SUPPLY REFILL ON HIS TEMAZEPAM, ALLOPURINOL, TO GO TO [facility]

## 2016-09-30 NOTE — Telephone Encounter (Signed)
Temazepam has to be a 1 month supply with refills.  My policy.

## 2016-10-01 ENCOUNTER — Encounter: Payer: Self-pay | Admitting: Family Medicine

## 2016-10-01 MED ORDER — ALLOPURINOL 300 MG PO TABS
300.0000 mg | ORAL_TABLET | Freq: Every day | ORAL | 3 refills | Status: DC
Start: 2016-10-01 — End: 2018-01-03

## 2016-10-01 MED ORDER — TEMAZEPAM 30 MG PO CAPS: 30.0000 mg | ORAL_CAPSULE | Freq: Every day | ORAL | 2 refills | Status: DC

## 2016-10-01 NOTE — Telephone Encounter (Signed)
Temazepam called to pharm and others sent to [facility]

## 2016-10-01 NOTE — Telephone Encounter (Signed)
Pts wife calling to check on status of temazepam.

## 2016-10-01 NOTE — Telephone Encounter (Signed)
This encounter was created in error - please disregard.

## 2016-10-11 ENCOUNTER — Ambulatory Visit
Admission: RE | Admit: 2016-10-11 | Discharge: 2016-10-11 | Disposition: A | Discharge status: Home / Self Care | Payer: PPO | Source: Ambulatory Visit | Attending: Anesthesiology | Admitting: Anesthesiology

## 2016-10-11 ENCOUNTER — Other Ambulatory Visit: Payer: Self-pay | Admitting: Anesthesiology

## 2016-10-11 DIAGNOSIS — M25552 Pain in left hip: Principal | ICD-10-CM

## 2016-10-11 DIAGNOSIS — M25551 Pain in right hip: Secondary | ICD-10-CM

## 2016-10-11 DIAGNOSIS — Z79891 Long term (current) use of opiate analgesic: Secondary | ICD-10-CM | POA: Diagnosis not present

## 2016-10-11 DIAGNOSIS — M150 Primary generalized (osteo)arthritis: Secondary | ICD-10-CM | POA: Diagnosis not present

## 2016-10-11 DIAGNOSIS — G894 Chronic pain syndrome: Secondary | ICD-10-CM | POA: Diagnosis not present

## 2016-10-11 DIAGNOSIS — M545 Low back pain: Secondary | ICD-10-CM | POA: Diagnosis not present

## 2016-10-11 MED FILL — traMADol HCL 50 MG TABS: 50 | 25 days supply | Qty: 200 | Fill #0

## 2016-10-21 MED FILL — TEMAZEPAM 30 MG CAPSULE: 30 | 30 days supply | Qty: 30 | Fill #0

## 2016-11-01 ENCOUNTER — Telehealth: Payer: Self-pay | Admitting: Cardiovascular Disease

## 2016-11-01 ENCOUNTER — Ambulatory Visit
Admission: RE | Admit: 2016-11-01 | Discharge: 2016-11-01 | Disposition: A | Discharge status: Home / Self Care | Payer: PPO | Source: Ambulatory Visit | Attending: Anesthesiology | Admitting: Anesthesiology

## 2016-11-01 ENCOUNTER — Other Ambulatory Visit: Payer: Self-pay | Admitting: Anesthesiology

## 2016-11-01 DIAGNOSIS — M25559 Pain in unspecified hip: Secondary | ICD-10-CM | POA: Diagnosis not present

## 2016-11-01 DIAGNOSIS — M545 Low back pain: Secondary | ICD-10-CM | POA: Diagnosis not present

## 2016-11-01 DIAGNOSIS — M533 Sacrococcygeal disorders, not elsewhere classified: Secondary | ICD-10-CM | POA: Diagnosis not present

## 2016-11-01 DIAGNOSIS — Z79891 Long term (current) use of opiate analgesic: Secondary | ICD-10-CM | POA: Diagnosis not present

## 2016-11-01 DIAGNOSIS — G894 Chronic pain syndrome: Secondary | ICD-10-CM | POA: Diagnosis not present

## 2016-11-01 MED FILL — HYDROmorphone HCL 2 MG TABS: 2 | 30 days supply | Qty: 120 | Fill #0

## 2016-11-01 NOTE — Telephone Encounter (Signed)
Unable to reach caller at number provided.

## 2016-11-01 NOTE — Telephone Encounter (Signed)
She wants to know if pt can stop his Eliquis for his Spinal Injection. How many days prior and how many days after does he need to do this?

## 2016-11-02 NOTE — Telephone Encounter (Signed)
Spoke with office they are trying to schedule right SI joint  Injection, ok to stop Eliquis? How long before injection?

## 2016-11-03 NOTE — Telephone Encounter (Signed)
Letter written and faxed to Dr. Hardin Negus.  Ok to hold x 2 days prior to injection

## 2016-11-15 DIAGNOSIS — G894 Chronic pain syndrome: Secondary | ICD-10-CM | POA: Diagnosis not present

## 2016-11-15 DIAGNOSIS — M533 Sacrococcygeal disorders, not elsewhere classified: Secondary | ICD-10-CM | POA: Diagnosis not present

## 2016-11-15 DIAGNOSIS — Z79891 Long term (current) use of opiate analgesic: Secondary | ICD-10-CM | POA: Diagnosis not present

## 2016-11-15 DIAGNOSIS — M545 Low back pain: Secondary | ICD-10-CM | POA: Diagnosis not present

## 2016-11-26 MED FILL — TEMAZEPAM 30 MG CAPSULE: 30 | 30 days supply | Qty: 30 | Fill #1

## 2016-11-29 ENCOUNTER — Encounter (INDEPENDENT_AMBULATORY_CARE_PROVIDER_SITE_OTHER): Payer: Self-pay | Admitting: Internal Medicine

## 2016-11-29 ENCOUNTER — Ambulatory Visit (INDEPENDENT_AMBULATORY_CARE_PROVIDER_SITE_OTHER): Payer: PPO | Admitting: Internal Medicine

## 2016-11-29 ENCOUNTER — Encounter (INDEPENDENT_AMBULATORY_CARE_PROVIDER_SITE_OTHER): Payer: Self-pay

## 2016-11-29 ENCOUNTER — Other Ambulatory Visit (INDEPENDENT_AMBULATORY_CARE_PROVIDER_SITE_OTHER): Payer: Self-pay | Admitting: Internal Medicine

## 2016-11-29 VITALS — BP 132/80 | HR 72 | Temp 97.7°F | Ht 71.0 in | Wt 300.6 lb

## 2016-11-29 DIAGNOSIS — Z86010 Personal history of colonic polyps: Secondary | ICD-10-CM

## 2016-11-29 NOTE — Progress Notes (Signed)
Subjective:    Patient ID: Garrett Munoz, male    DOB: [birth year], 71 y.o.   MRN: [id number]  HPI Presents today with c/o back pain.Injured about 30 yrs ago. He says cheese constipates him.  He takes Metamucil for constipation.  Had been taking for about a year to keep from getting constipated. Appetite is good. No weight loss. Has a BM BID. No melena or BRRB. He feels the Metamucil caused his back pain. He states he actually started to cancel this OV once his back stopped hurting. His last colonoscopy was in 2017 with multiple polyps. He is on a recall for a colonoscopy  this year.    Hx of A fib and maintained on Eliquis.  01/01/2016 Colonoscopy:  Heme positive stool.  Impression:               - Three 4 to 6 mm polyps in the descending colon,                            in the transverse colon and in the ascending colon,                            removed with a cold snare. Resected and retrieved.                           - One 10 mm polyp in the transverse colon, removed                            with a hot snare. Resected and retrieved. Clip (MR                            conditional) was placed.                           - One 11 mm polyp in the descending colon. Injected.                           - One 9 mm polyp in the descending colon, removed                            with a hot snare. Resected and retrieved. Clip (MR                            conditional) was placed.                           - Diverticulosis in the sigmoid colon.   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 Review of Systems Past Medical History:  Diagnosis Date  . Atrial fibrillation ([place])   . Bronchitis    hx  . Cataract    right eye  . DDD (degenerative disc disease), lumbosacral   . Dysrhythmia    irregular heatbeat  . Eye worm    right eye  . Gout   . Left knee DJD   . Morbid obesity with BMI of 40.0-44.9, adult ([place])   . Pneumonia    hx  . Prediabetes   . Radicular syndrome  of left leg    . S/P total knee replacement    right  . Sleep apnea    can't use sleep study 10 yrs ago  . 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     Past Surgical History:  Procedure Laterality Date  . CARDIOVERSION N/A 03/04/2015   Procedure: CARDIOVERSION;  Surgeon: [name], MD;  Location: [place];  Service: Cardiovascular;  Laterality: N/A;  . CHOLECYSTECTOMY    . COLONOSCOPY N/A 01/01/2016   Procedure: COLONOSCOPY;  Surgeon: [name], MD;  [facility];  Service: Endoscopy;  Laterality: N/A;  1:45  . EYE EXAMINATION UNDER ANESTHESIA W/ RETINAL CRYOTHERAPY AND RETINAL LASER  07/2011  . EYE SURGERY  02/2011   cat rt  . KNEE ARTHROSCOPY     bilateral  . POLYPECTOMY  01/01/2016   Procedure: POLYPECTOMY;  Surgeon: [name], MD;  [facility];  Service: Endoscopy;;  colon  . RETINAL DETACHMENT SURGERY  03/2011   rt  . TOTAL KNEE ARTHROPLASTY  2003   rt  . TOTAL KNEE ARTHROPLASTY  02/07/2012   Procedure: TOTAL KNEE ARTHROPLASTY;  Surgeon: [name], MD;  Location: [place];  Service: Orthopedics;  Laterality: Left;    No Known Allergies  Current Outpatient Prescriptions on File Prior to Visit  Medication Sig Dispense Refill  . acetaminophen (TYLENOL) 325 MG tablet Take 2 tablets (650 mg total) by mouth every 6 (six) hours as needed (or Fever >/= 101). (Patient taking differently: Take 650 mg by mouth every 6 (six) hours as needed for mild pain (or Fever >/= 101). )    . allopurinol (ZYLOPRIM) 300 MG tablet Take 1 tablet (300 mg total) by mouth daily. 90 tablet 3  . apixaban (ELIQUIS) 5 MG TABS tablet Take 1 tablet (5 mg total) by mouth 2 (two) times daily. 180 tablet 3  . carvedilol (COREG) 12.5 MG tablet Take 1.5 tablet in the morning and 1 tablet in the evening 225 tablet 3  . fish oil-omega-3 fatty acids 1000 MG capsule Take 1 g by mouth daily.    [name] gabapentin (NEURONTIN) 300 MG capsule Take 1 capsule (300 mg total) by mouth 3 (three) times daily. 90  capsule 3  . magnesium oxide (MAG-OX) 400 MG tablet Take 400 mg by mouth daily.    . Misc Natural Products (BLACK CHERRY CONCENTRATE PO) Take 1 tablet by mouth daily.    . temazepam (RESTORIL) 30 MG capsule Take 1 capsule (30 mg total) by mouth at bedtime. 30 capsule 2   No current facility-administered medications on file prior to visit.         Objective:   Physical Exam  Blood pressure 132/80, pulse 72, temperature 97.7 F (36.5 C), height 5\' 11"  (1.803 m), weight (!) 300 lb 9.6 oz (136.4 kg). Alert and oriented. Skin warm and dry. Oral mucosa is moist.   . Sclera anicteric, conjunctivae is pink. Thyroid not enlarged. No cervical lymphadenopathy. Lungs clear. Heart regular rate and rhythm.  Abdomen is soft. Bowel sounds are positive. No hepatomegaly. No abdominal masses felt. No tenderness.  No edema to lower extremities.           Assessment & Plan:  Multiple tubular adenoma. Needs surveillance.  Colonoscopy.

## 2016-11-29 NOTE — Patient Instructions (Addendum)
The risks of bleeding, perforation and infection were reviewed with patient.' Colonoscopy.

## 2016-11-30 ENCOUNTER — Telehealth (INDEPENDENT_AMBULATORY_CARE_PROVIDER_SITE_OTHER): Payer: Self-pay | Admitting: *Deleted

## 2016-11-30 ENCOUNTER — Encounter (INDEPENDENT_AMBULATORY_CARE_PROVIDER_SITE_OTHER): Payer: Self-pay | Admitting: *Deleted

## 2016-11-30 ENCOUNTER — Telehealth: Payer: Self-pay | Admitting: Family Medicine

## 2016-11-30 DIAGNOSIS — Z86010 Personal history of colonic polyps: Secondary | ICD-10-CM | POA: Insufficient documentation

## 2016-11-30 MED ORDER — PEG 3350-KCL-NA BICARB-NACL 420 G PO SOLR: 4000.0000 mL | Freq: Once | ORAL | 0 refills | Status: AC

## 2016-11-30 NOTE — Telephone Encounter (Signed)
Patient needs trilyte 

## 2016-11-30 NOTE — Telephone Encounter (Signed)
Patient dropped off DMV form for provider to fill out. Put in yellow folder.  CB# [phone]

## 2016-12-01 ENCOUNTER — Telehealth: Payer: Self-pay | Admitting: Cardiovascular Disease

## 2016-12-01 NOTE — Telephone Encounter (Signed)
New Message       [facility] Pre-operative Risk Assessment    Request for surgical clearance:  1. What type of surgery is being performed? Colonostomy   2. When is this surgery scheduled? 01/14/17   3. Are there any medications that need to be held prior to surgery and how long? Want to know if Eliquis can be held  4. Name of physician performing surgery? Dr. Laural Golden  5. What is your office phone and fax number? [phone] Joylene Igo) (Can send through [facility])  [place] 12/01/2016, 9:45 AM  _________________________________________________________________   (provider comments below)

## 2016-12-02 NOTE — Telephone Encounter (Signed)
Form left up front and patient's wife aware to pick up

## 2016-12-03 NOTE — Telephone Encounter (Signed)
Hold for 48 hours prior to colonoscopy

## 2016-12-03 NOTE — Telephone Encounter (Signed)
Clearance routed to MD in-basket

## 2016-12-03 NOTE — Telephone Encounter (Signed)
Patient with diagnosis of atrial fibrillation on Apixaban for anticoagulation.    Procedure: Colonoscopy Date of procedure: 01/14/2017  CHADS2-VASc score of  2 (CHF, HTN)  Est. CrCl = 70 ml/min Platelet count  173  Per office protocol, patient can hold apixaban for 2days prior to procedure.   Patient will not need bridging with Lovenox (enoxaparin) around procedure.  Patient should restart Apixaban on the evening of procedure or day after, at discretion of procedure MD

## 2016-12-08 NOTE — Telephone Encounter (Signed)
Recommendations noted.

## 2016-12-13 DIAGNOSIS — G894 Chronic pain syndrome: Secondary | ICD-10-CM | POA: Diagnosis not present

## 2016-12-13 DIAGNOSIS — M7062 Trochanteric bursitis, left hip: Secondary | ICD-10-CM | POA: Diagnosis not present

## 2016-12-13 DIAGNOSIS — M545 Low back pain: Secondary | ICD-10-CM | POA: Diagnosis not present

## 2016-12-13 DIAGNOSIS — Z79891 Long term (current) use of opiate analgesic: Secondary | ICD-10-CM | POA: Diagnosis not present

## 2016-12-13 MED FILL — HYDROmorphone HCL 4 MG TABS: 4 | 30 days supply | Qty: 120 | Fill #0

## 2016-12-29 MED FILL — ELIQUIS 5 MG TABLET: 5 | 90 days supply | Qty: 180 | Fill #2

## 2016-12-29 MED FILL — CARVEDILOL 12.5 MG TABLET: 12.5 | 90 days supply | Qty: 225 | Fill #2

## 2016-12-29 MED FILL — PEG-3350 SOLUTION: 420 | 1 days supply | Qty: 4000 | Fill #0

## 2016-12-29 MED FILL — TEMAZEPAM 30 MG CAPSULE: 30 | 30 days supply | Qty: 30 | Fill #2

## 2016-12-29 MED FILL — ALLOPURINOL 300 MG TABLET: 300 | 90 days supply | Qty: 90 | Fill #0

## 2016-12-30 ENCOUNTER — Other Ambulatory Visit: Payer: Self-pay | Admitting: Orthopedic Surgery

## 2016-12-30 DIAGNOSIS — M25562 Pain in left knee: Secondary | ICD-10-CM | POA: Diagnosis not present

## 2016-12-30 DIAGNOSIS — M25561 Pain in right knee: Secondary | ICD-10-CM | POA: Diagnosis not present

## 2016-12-30 DIAGNOSIS — M545 Low back pain: Secondary | ICD-10-CM

## 2017-01-04 ENCOUNTER — Ambulatory Visit (INDEPENDENT_AMBULATORY_CARE_PROVIDER_SITE_OTHER): Payer: PPO | Admitting: Family Medicine

## 2017-01-04 DIAGNOSIS — Z23 Encounter for immunization: Secondary | ICD-10-CM | POA: Diagnosis not present

## 2017-01-14 ENCOUNTER — Encounter (HOSPITAL_COMMUNITY)
Admission: RE | Disposition: A | Discharge status: Home / Self Care | Payer: Self-pay | Source: Ambulatory Visit | Attending: Internal Medicine

## 2017-01-14 ENCOUNTER — Ambulatory Visit (HOSPITAL_COMMUNITY)
Admission: RE | Admit: 2017-01-14 | Discharge: 2017-01-14 | Disposition: A | Discharge status: Home / Self Care | Payer: PPO | Source: Ambulatory Visit | Attending: Internal Medicine | Admitting: Internal Medicine

## 2017-01-14 ENCOUNTER — Encounter (HOSPITAL_COMMUNITY): Payer: Self-pay | Admitting: *Deleted

## 2017-01-14 DIAGNOSIS — K635 Polyp of colon: Secondary | ICD-10-CM | POA: Insufficient documentation

## 2017-01-14 DIAGNOSIS — R7303 Prediabetes: Secondary | ICD-10-CM | POA: Diagnosis not present

## 2017-01-14 DIAGNOSIS — Z87891 Personal history of nicotine dependence: Secondary | ICD-10-CM | POA: Diagnosis not present

## 2017-01-14 DIAGNOSIS — Z1211 Encounter for screening for malignant neoplasm of colon: Secondary | ICD-10-CM | POA: Diagnosis not present

## 2017-01-14 DIAGNOSIS — I4891 Unspecified atrial fibrillation: Secondary | ICD-10-CM | POA: Diagnosis not present

## 2017-01-14 DIAGNOSIS — M109 Gout, unspecified: Secondary | ICD-10-CM | POA: Insufficient documentation

## 2017-01-14 DIAGNOSIS — Z96651 Presence of right artificial knee joint: Secondary | ICD-10-CM | POA: Diagnosis not present

## 2017-01-14 DIAGNOSIS — Z86010 Personal history of colonic polyps: Secondary | ICD-10-CM | POA: Insufficient documentation

## 2017-01-14 DIAGNOSIS — Z801 Family history of malignant neoplasm of trachea, bronchus and lung: Secondary | ICD-10-CM | POA: Diagnosis not present

## 2017-01-14 DIAGNOSIS — Z6841 Body Mass Index (BMI) 40.0 and over, adult: Secondary | ICD-10-CM | POA: Insufficient documentation

## 2017-01-14 DIAGNOSIS — Z09 Encounter for follow-up examination after completed treatment for conditions other than malignant neoplasm: Secondary | ICD-10-CM | POA: Diagnosis not present

## 2017-01-14 DIAGNOSIS — Z79899 Other long term (current) drug therapy: Secondary | ICD-10-CM | POA: Diagnosis not present

## 2017-01-14 DIAGNOSIS — Z7902 Long term (current) use of antithrombotics/antiplatelets: Secondary | ICD-10-CM | POA: Insufficient documentation

## 2017-01-14 DIAGNOSIS — D125 Benign neoplasm of sigmoid colon: Secondary | ICD-10-CM | POA: Diagnosis not present

## 2017-01-14 DIAGNOSIS — D123 Benign neoplasm of transverse colon: Secondary | ICD-10-CM | POA: Diagnosis not present

## 2017-01-14 DIAGNOSIS — D124 Benign neoplasm of descending colon: Secondary | ICD-10-CM | POA: Diagnosis not present

## 2017-01-14 DIAGNOSIS — K5730 Diverticulosis of large intestine without perforation or abscess without bleeding: Secondary | ICD-10-CM | POA: Diagnosis not present

## 2017-01-14 HISTORY — PX: COLONOSCOPY: SHX5424

## 2017-01-14 SURGERY — COLONOSCOPY
Anesthesia: Moderate Sedation

## 2017-01-14 MED ORDER — MIDAZOLAM HCL 5 MG/5ML IJ SOLN
INTRAMUSCULAR | Status: DC | PRN
  Administered 2017-01-14 (×2): 2 mg via INTRAVENOUS

## 2017-01-14 MED ORDER — MIDAZOLAM HCL 5 MG/5ML IJ SOLN
INTRAMUSCULAR | Status: AC
  Filled 2017-01-14: qty 10

## 2017-01-14 MED ORDER — SIMETHICONE 40 MG/0.6ML PO SUSP
ORAL | Status: DC | PRN
  Administered 2017-01-14: 12:00:00

## 2017-01-14 MED ORDER — SODIUM CHLORIDE 0.9 % IV SOLN
INTRAVENOUS | Status: DC
  Administered 2017-01-14: 1000 mL via INTRAVENOUS

## 2017-01-14 MED ORDER — MEPERIDINE HCL 50 MG/ML IJ SOLN
INTRAMUSCULAR | Status: AC
  Filled 2017-01-14: qty 1

## 2017-01-14 MED ORDER — MEPERIDINE HCL 50 MG/ML IJ SOLN
INTRAMUSCULAR | Status: DC | PRN
  Administered 2017-01-14 (×2): 25 mg

## 2017-01-14 NOTE — H&P (Signed)
Garrett Munoz is an 71 y.o. male.   Chief Complaint: Patient is here for colonoscopy. HPI: patient is 71 year old Caucasian male who was found to have positive cologaurd Blood one year ago. He underwent colonoscopy with removal of polyps. Prep was sub-optimal. He was therefore advised to return for follow-up exam in a year. He has no GI complaints other than constipation for which she is taking Colace and when necessary milk of magnesia.He denies rectal bleeding or melena. Family history is negative for CRC.  Past Medical History:  Diagnosis Date  . Atrial fibrillation ([place])   . Bronchitis    hx  . Cataract    right eye  . DDD (degenerative disc disease), lumbosacral   . Dysrhythmia    irregular heatbeat  . Eye worm    right eye  . Gout   . Left knee DJD   . Morbid obesity with BMI of 40.0-44.9, adult ([place])   . Pneumonia    hx  . Prediabetes   . Radicular syndrome of left leg   . S/P total knee replacement    right  . Sleep apnea    can't use sleep study 10 yrs ago  . 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     Past Surgical History:  Procedure Laterality Date  . CARDIOVERSION N/A 03/04/2015   Procedure: CARDIOVERSION;  Surgeon: [name], MD;  Location: [place];  Service: Cardiovascular;  Laterality: N/A;  . CHOLECYSTECTOMY    . COLONOSCOPY N/A 01/01/2016   Procedure: COLONOSCOPY;  Surgeon: [name], MD;  [facility];  Service: Endoscopy;  Laterality: N/A;  1:45  . EYE EXAMINATION UNDER ANESTHESIA W/ RETINAL CRYOTHERAPY AND RETINAL LASER  07/2011  . EYE SURGERY  02/2011   cat rt  . KNEE ARTHROSCOPY     bilateral  . POLYPECTOMY  01/01/2016   Procedure: POLYPECTOMY;  Surgeon: [name], MD;  [facility];  Service: Endoscopy;;  colon  . RETINAL DETACHMENT SURGERY  03/2011   rt  . TOTAL KNEE ARTHROPLASTY  2003   rt  . TOTAL KNEE ARTHROPLASTY  02/07/2012   Procedure: TOTAL KNEE ARTHROPLASTY;  Surgeon: [name], MD;  Location: [place];  Service: Orthopedics;  Laterality: Left;    Family History  Problem Relation Age of Onset  . Lung cancer Father   . Lung cancer Sister    Social History:  reports that he quit smoking about 24 years ago. His smoking use included Cigarettes. He has a 30.00 pack-year smoking history. He has never used smokeless tobacco. He reports that he drinks alcohol. He reports that he does not use drugs.  Allergies: No Known Allergies  Medications Prior to Admission  Medication Sig Dispense Refill  . acetaminophen (TYLENOL) 325 MG tablet Take 2 tablets (650 mg total) by mouth every 6 (six) hours as needed (or Fever >/= 101). (Patient taking differently: Take 650 mg by mouth every 6 (six) hours as needed for mild pain (or Fever >/= 101). )    . allopurinol (ZYLOPRIM) 300 MG tablet Take 1 tablet (300 mg total) by mouth daily. 90 tablet 3  . apixaban (ELIQUIS) 5 MG TABS tablet Take 1 tablet (5 mg total) by mouth 2 (two) times daily. 180 tablet 3  . carvedilol (COREG) 12.5 MG tablet Take 1.5 tablet in the morning and 1 tablet in the evening 225 tablet 3  . fish oil-omega-3 fatty acids 1000 MG capsule Take 1 g by mouth daily.    [name]  HYDROmorphone (DILAUDID) 4 MG tablet Take 4 mg by mouth 2 (two) times daily as needed for severe pain.    . magnesium oxide (MAG-OX) 400 MG tablet Take 400 mg by mouth daily.    . temazepam (RESTORIL) 30 MG capsule Take 1 capsule (30 mg total) by mouth at bedtime. 30 capsule 2    No results found for this or any previous visit (from the past 48 hour(s)). No results found.  ROS  Blood pressure 118/77, pulse 69, temperature 97.8 F (36.6 C), temperature source Oral, resp. rate 17, height 5\' 11"  (1.803 m), weight 297 lb (134.7 kg), SpO2 100 %. Physical Exam  Constitutional: He appears well-developed and well-nourished.  HENT:  Mouth/Throat: Oropharynx is clear and moist.  Eyes: Conjunctivae are normal. No scleral icterus.  Neck: No thyromegaly present.  Cardiovascular:   irregular rhythm normal S1 and S2. No murmur or gallop noted.  Respiratory: Effort normal and breath sounds normal.  GI:  Abdomen is full. It is soft and nontender without organomegaly or mas  Musculoskeletal: He exhibits no edema.  Lymphadenopathy:    He has no cervical adenopathy.     Assessment/Plan History of colonic adenomas. Surveillance colonoscopy.  Hildred Laser, MD 01/14/2017, 11:55 AM

## 2017-01-14 NOTE — Op Note (Signed)
[facility] Patient Name: Garrett Munoz Procedure Date: 01/14/2017 11:31 AM MRN: [id number] Date of Birth: 01-21-1946 Attending MD: Hildred Laser , MD CSN: [id number] Age: 71 Admit Type: Outpatient Procedure:                Colonoscopy Indications:              High risk colon cancer surveillance: Personal                            history of colonic polyps Providers:                Hildred Laser, MD, Otis Peak B. Sharon Seller, RN, Aram Candela Referring MD:             Cammie Mcgee. Pickard, MD Medicines:                Meperidine 50 mg IV, Midazolam 4 mg IV Complications:            No immediate complications. Estimated Blood Loss:     Estimated blood loss was minimal. Procedure:                Pre-Anesthesia Assessment:                           - Prior to the procedure, a History and Physical                            was performed, and patient medications and                            allergies were reviewed. The patient's tolerance of                            previous anesthesia was also reviewed. The risks                            and benefits of the procedure and the sedation                            options and risks were discussed with the patient.                            All questions were answered, and informed consent                            was obtained. Prior Anticoagulants: The patient                            last took Eliquis (apixaban) 3 days prior to the                            procedure. ASA Grade Assessment: III - A patient  with severe systemic disease. After reviewing the                            risks and benefits, the patient was deemed in                            satisfactory condition to undergo the procedure.                           After obtaining informed consent, the colonoscope                            was passed under direct vision. Throughout the   procedure, the patient's blood pressure, pulse, and                            oxygen saturations were monitored continuously. The                            [id number] ([id number]) was introduced through the                            anus and advanced to the the cecum, identified by                            appendiceal orifice and ileocecal valve. The                            colonoscopy was performed without difficulty. The                            patient tolerated the procedure well. The quality                            of the bowel preparation was adequate to identify                            polyps 6 mm and larger in size. The ileocecal                            valve, appendiceal orifice, and rectum were                            photographed. Scope In: 12:03:32 PM Scope Out: 12:26:15 PM Scope Withdrawal Time: 0 hours 18 minutes 33 seconds  Total Procedure Duration: 0 hours 22 minutes 43 seconds  Findings:      The perianal and digital rectal examinations were normal.      Five sessile polyps were found in the sigmoid colon, descending colon       and splenic flexure. The polyps were small in size. These were biopsied       with a cold forceps for histology. The pathology specimen was placed       into Bottle Number 1.      A 4 mm polyp was found in the  distal sigmoid colon. The polyp was       sessile. The polyp was removed with a cold snare. Resection and       retrieval were complete. The pathology specimen was placed into Bottle       Number 1.      Scattered medium-mouthed diverticula were found in the sigmoid colon.      The retroflexed view of the distal rectum and anal verge was normal and       showed no anal or rectal abnormalities. Impression:               - Five small polyps in the sigmoid colon, in the                            descending colon and at the splenic flexure.                            Biopsied.                           - One 4 mm polyp,  removed with a cold snare.                            Resected and retrieved.                           - Diverticulosis in the sigmoid colon. Moderate Sedation:      Moderate (conscious) sedation was administered by the endoscopy nurse       and supervised by the endoscopist. The following parameters were       monitored: oxygen saturation, heart rate, blood pressure, CO2       capnography and response to care. Total physician intraservice time was       26 minutes. Recommendation:           - Patient has a contact number available for                            emergencies. The signs and symptoms of potential                            delayed complications were discussed with the                            patient. Return to normal activities tomorrow.                            Written discharge instructions were provided to the                            patient.                           - High fiber diet today.                           - Continue present medications.                           -  Resume Eliquis (apixaban) at prior dose in 2 days.                           - Await pathology results.                           - Repeat colonoscopy in 3 years for surveillance. Procedure Code(s):        --- Professional ---                           [id number], Colonoscopy, flexible; with removal of                            tumor(s), polyp(s), or other lesion(s) by snare                            technique                           45380, 59, Colonoscopy, flexible; with biopsy,                            single or multiple                           99152, Moderate sedation services provided by the                            same physician or other qualified health care                            professional performing the diagnostic or                            therapeutic service that the sedation supports,                            requiring the presence of an independent trained                             observer to assist in the monitoring of the                            patient's level of consciousness and physiological                            status; initial 15 minutes of intraservice time,                            patient age 82 years or older                           [id number], Moderate sedation services; each additional  15 minutes intraservice time Diagnosis Code(s):        --- Professional ---                           Z86.010, Personal history of colonic polyps                           D12.5, Benign neoplasm of sigmoid colon                           D12.4, Benign neoplasm of descending colon                           D12.3, Benign neoplasm of transverse colon (hepatic                            flexure or splenic flexure)                           K57.30, Diverticulosis of large intestine without                            perforation or abscess without bleeding CPT copyright 2016 [employer]. All rights reserved. The codes documented in this report are preliminary and upon coder review may  be revised to meet current compliance requirements. Hildred Laser, MD Hildred Laser, MD 01/14/2017 12:35:56 PM This report has been signed electronically. Number of Addenda: 0

## 2017-01-14 NOTE — Discharge Instructions (Signed)
Resume Apixaban on 01/16/2017. Resume other medications as before. High fiber diet. Physician will call with biopsy results. Next colonoscopy in 3 years.   Colonoscopy, Adult, Care After This sheet gives you information about how to care for yourself after your procedure. Your doctor may also give you more specific instructions. If you have problems or questions, call your doctor. Follow these instructions at home: General instructions   For the first 24 hours after the procedure: ? Do not drive or use machinery. ? Do not sign important documents. ? Do not drink alcohol. ? Do your daily activities more slowly than normal. ? Eat foods that are soft and easy to digest. ? Rest often.  Take over-the-counter or prescription medicines only as told by your doctor.  It is up to you to get the results of your procedure. Ask your doctor, or the department performing the procedure, when your results will be ready. To help cramping and bloating:  Try walking around.  Put heat on your belly (abdomen) as told by your doctor. Use a heat source that your doctor recommends, such as a moist heat pack or a heating pad. ? Put a towel between your skin and the heat source. ? Leave the heat on for 20-30 minutes. ? Remove the heat if your skin turns bright red. This is especially important if you cannot feel pain, heat, or cold. You can get burned. Eating and drinking  Drink enough fluid to keep your pee (urine) clear or pale yellow.  Return to your normal diet as told by your doctor. Avoid heavy or fried foods that are hard to digest.  Avoid drinking alcohol for as long as told by your doctor. Contact a doctor if:  You have blood in your poop (stool) 2-3 days after the procedure. Get help right away if:  You have more than a small amount of blood in your poop.  You see large clumps of tissue (blood clots) in your poop.  Your belly is swollen.  You feel sick to your stomach  (nauseous).  You throw up (vomit).  You have a fever.  You have belly pain that gets worse, and medicine does not help your pain. This information is not intended to replace advice given to you by your health care provider. Make sure you discuss any questions you have with your health care provider. Document Released: 05/08/2010 Document Revised: 12/29/2015 Document Reviewed: 12/29/2015 Elsevier Interactive Patient Education  2017 [place].  Colon Polyps Polyps are tissue growths inside the body. Polyps can grow in many places, including the large intestine (colon). A polyp may be a round bump or a mushroom-shaped growth. You could have one polyp or several. Most colon polyps are noncancerous (benign). However, some colon polyps can become cancerous over time. What are the causes? The exact cause of colon polyps is not known. What increases the risk? This condition is more likely to develop in people who:  Have a family history of colon cancer or colon polyps.  Are older than 69 or older than 45 if they are African American.  Have inflammatory bowel disease, such as ulcerative colitis or Crohn disease.  Are overweight.  Smoke cigarettes.  Do not get enough exercise.  Drink too much alcohol.  Eat a diet that is: ? High in fat and red meat. ? Low in fiber.  Had childhood cancer that was treated with abdominal radiation.  What are the signs or symptoms? Most polyps do not cause symptoms. If you  have symptoms, they may include:  Blood coming from your rectum when having a bowel movement.  Blood in your stool.The stool may look dark red or black.  A change in bowel habits, such as constipation or diarrhea.  How is this diagnosed? This condition is diagnosed with a colonoscopy. This is a procedure that uses a lighted, flexible scope to look at the inside of your colon. How is this treated? Treatment for this condition involves removing any polyps that are found.  Those polyps will then be tested for cancer. If cancer is found, your health care provider will talk to you about options for colon cancer treatment. Follow these instructions at home: Diet  Eat plenty of fiber, such as fruits, vegetables, and whole grains.  Eat foods that are high in calcium and vitamin D, such as milk, cheese, yogurt, eggs, liver, fish, and broccoli.  Limit foods high in fat, red meats, and processed meats, such as hot dogs, sausage, bacon, and lunch meats.  Maintain a healthy weight, or lose weight if recommended by your health care provider. General instructions  Do not smoke cigarettes.  Do not drink alcohol excessively.  Keep all follow-up visits as told by your health care provider. This is important. This includes keeping regularly scheduled colonoscopies. Talk to your health care provider about when you need a colonoscopy.  Exercise every day or as told by your health care provider. Contact a health care provider if:  You have new or worsening bleeding during a bowel movement.  You have new or increased blood in your stool.  You have a change in bowel habits.  You unexpectedly lose weight. This information is not intended to replace advice given to you by your health care provider. Make sure you discuss any questions you have with your health care provider. Document Released: 12/31/2003 Document Revised: 09/11/2015 Document Reviewed: 02/24/2015 Elsevier Interactive Patient Education  [employer].

## 2017-01-17 ENCOUNTER — Ambulatory Visit
Admission: RE | Admit: 2017-01-17 | Discharge: 2017-01-17 | Disposition: A | Discharge status: Home / Self Care | Payer: PPO | Source: Ambulatory Visit | Attending: Orthopedic Surgery | Admitting: Orthopedic Surgery

## 2017-01-17 DIAGNOSIS — M545 Low back pain: Secondary | ICD-10-CM

## 2017-01-17 DIAGNOSIS — M48061 Spinal stenosis, lumbar region without neurogenic claudication: Secondary | ICD-10-CM | POA: Diagnosis not present

## 2017-01-21 ENCOUNTER — Encounter (HOSPITAL_COMMUNITY): Payer: Self-pay | Admitting: Internal Medicine

## 2017-01-25 ENCOUNTER — Ambulatory Visit (INDEPENDENT_AMBULATORY_CARE_PROVIDER_SITE_OTHER): Payer: PPO | Admitting: Cardiovascular Disease

## 2017-01-25 ENCOUNTER — Encounter: Payer: Self-pay | Admitting: Cardiovascular Disease

## 2017-01-25 VITALS — BP 150/84 | HR 79 | Ht 71.0 in | Wt 303.4 lb

## 2017-01-25 DIAGNOSIS — G4733 Obstructive sleep apnea (adult) (pediatric): Secondary | ICD-10-CM | POA: Diagnosis not present

## 2017-01-25 DIAGNOSIS — I4821 Permanent atrial fibrillation: Secondary | ICD-10-CM

## 2017-01-25 DIAGNOSIS — I10 Essential (primary) hypertension: Secondary | ICD-10-CM | POA: Diagnosis not present

## 2017-01-25 DIAGNOSIS — I482 Chronic atrial fibrillation: Secondary | ICD-10-CM

## 2017-01-25 DIAGNOSIS — Z7901 Long term (current) use of anticoagulants: Secondary | ICD-10-CM

## 2017-01-25 NOTE — Patient Instructions (Signed)
Medication Instructions:  Your physician recommends that you continue on your current medications as directed. Please refer to the Current Medication list given to you today.  Follow-Up: Your physician wants you to follow-up in: 12 MONTHS with Dr. Kelly. You will receive a reminder letter in the mail two months in advance. If you don't receive a letter, please call our office to schedule the follow-up appointment.   Any Other Special Instructions Will Be Listed Below (If Applicable).     If you need a refill on your cardiac medications before your next appointment, please call your pharmacy.   

## 2017-01-25 NOTE — Progress Notes (Signed)
Patient ID: Garrett Munoz, male   DOB: Mar 18, 1946, 71 y.o.   MRN: [id number]     HPI: Garrett Munoz is a 71 y.o. male who presents to the office today for a 7 month follow-up evaluation.  Garrett Munoz has a history of hypertension, and remotely had taken blood pressure medications for several years but none since the last 20 years. He has a history of prior hypertension, obstructive sleep apnea, untreated due to previous intolerance to full face mask, as well as obesity.  He admits to having an intermittent irregular rhythm.  Last autumn he was scheduled to undergo elective surgery on his right foot hammertoe by Dr. Fritzi Mandes.  Surgery was canceled due to concerns for possible Mobitz type II block with possible atrial flutter.  He was seen by me for preoperative evaluation on 01/30/2015.  At that time, his ECG demonstrated atrial fibrillation with ventricular rate at approximately 100 bpm.  He was noted have small Q wave in lead 3.  He was started on Toprol 25 mg for 4 days and this was titrated up to 50 mg.  He also was started on eloquence 5 mg twice a day for anticoagulation.  A 2-D echo Doppler study on February 18 2015 revealed an ejection fraction of 55-60%.  There was moderate left ventricular hypertrophy.  The left atrium was moderately dilated.  A nuclear perfusion study done on 02/13/2015 was low risk without ischemia with only a mild apical defect, probably attenuation. He denies any chest pain.  He denies shortness of breath.  He is been unable to walk secondary to his toe abnormality.  He admits to daytime sleepiness and snoring.  He had not utilized CPAP therapy in years.  He is status post cataract surgery with lens implant.    He underwent cardioversion on March 04, 2015 and was successfully converted back to sinus rhythm.  On 04/10/2015 he was admitted with community-acquired pneumonia and was found to be back in atrial fibrillation.  He denies any episodes of chest pain. He has been on  eliquis 5 mg twice a day for anticoagulation , carvedilol 6.25 mg twice a day.  He is unaware of his rhythm being abnormal. He is not sleeping well but has not been using his CPAP. He feels fatigued.  When I saw him in March 2017 he continued to be in atrial fibrillation.  After much discussion, concerning another attempt at trying to convert into sinus rhythm versus staying in permanent atrial fibrillation he opted to stay in permanent atrial fibrillation.  He has continued to be on anticoagulation therapy.  Since I last saw him in March 2018, he continues to have difficulty with low back pain as well as swelling in his hands due to arthritis.  He sees Dr. Dennard Schaumann for primary care.  He denies any recent episodes of chest pain.  He denies palpitations.  He has not been successful with weight loss and his BMI has increased up to 42.3.  Laboratory in April 2018 had shown glucose increased at 107.  Lipid studies revealed a cholesterol of 134, triglycerides 103, HDL 50, and LDL 63.  He is unaware of episodes of tachycardia.  He denies presyncope or syncope.  Past Medical History:  Diagnosis Date  . Atrial fibrillation ([place])   . Bronchitis    hx  . Cataract    right eye  . DDD (degenerative disc disease), lumbosacral   . Dysrhythmia    irregular heatbeat  . Eye worm  right eye  . Gout   . Left knee DJD   . Morbid obesity with BMI of 40.0-44.9, adult ([place])   . Pneumonia    hx  . Prediabetes   . Radicular syndrome of left leg   . S/P total knee replacement    right  . Sleep apnea    can't use sleep study 10 yrs ago  . 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     Past Surgical History:  Procedure Laterality Date  . CARDIOVERSION N/A 03/04/2015   Procedure: CARDIOVERSION;  Surgeon: [name], MD;  Location: [facility];  Service: Cardiovascular;  Laterality: N/A;  . CHOLECYSTECTOMY    . COLONOSCOPY N/A 01/01/2016   Procedure: COLONOSCOPY;  Surgeon: [name], MD;  [facility];   Service: Endoscopy;  Laterality: N/A;  1:45  . COLONOSCOPY N/A 01/14/2017   Procedure: COLONOSCOPY;  Surgeon: [name], MD;  [facility];  Service: Endoscopy;  Laterality: N/A;  12:05  . EYE EXAMINATION UNDER ANESTHESIA W/ RETINAL CRYOTHERAPY AND RETINAL LASER  07/2011  . EYE SURGERY  02/2011   cat rt  . KNEE ARTHROSCOPY     bilateral  . POLYPECTOMY  01/01/2016   Procedure: POLYPECTOMY;  Surgeon: [name], MD;  [facility];  Service: Endoscopy;;  colon  . RETINAL DETACHMENT SURGERY  03/2011   rt  . TOTAL KNEE ARTHROPLASTY  2003   rt  . TOTAL KNEE ARTHROPLASTY  02/07/2012   Procedure: TOTAL KNEE ARTHROPLASTY;  Surgeon: [name], MD;  Location: [facility];  Service: Orthopedics;  Laterality: Left;    No Known Allergies  Current Outpatient Prescriptions  Medication Sig Dispense Refill  . acetaminophen (TYLENOL) 325 MG tablet Take 2 tablets (650 mg total) by mouth every 6 (six) hours as needed (or Fever >/= 101). (Patient taking differently: Take 650 mg by mouth every 6 (six) hours as needed for mild pain (or Fever >/= 101). )    . allopurinol (ZYLOPRIM) 300 MG tablet Take 1 tablet (300 mg total) by mouth daily. 90 tablet 3  . apixaban (ELIQUIS) 5 MG TABS tablet Take 1 tablet (5 mg total) by mouth 2 (two) times daily. 180 tablet 3  . carvedilol (COREG) 12.5 MG tablet Take 1.5 tablet in the morning and 1 tablet in the evening 225 tablet 3  . fish oil-omega-3 fatty acids 1000 MG capsule Take 1 g by mouth daily.    [name] HYDROmorphone (DILAUDID) 4 MG tablet Take 4 mg by mouth 2 (two) times daily as needed for severe pain.    [name] HYDROmorphone (DILAUDID) 4 MG tablet Take 4 mg by mouth every 4 (four) hours as needed for severe pain.    . magnesium oxide (MAG-OX) 400 MG tablet Take 400 mg by mouth daily.    . temazepam (RESTORIL) 30 MG capsule Take 1 capsule (30 mg total) by mouth at bedtime. 30 capsule 2   No current facility-administered medications for this  visit.     Social History   Social History  . Marital status: Married    Spouse name: N/A  . Number of children: N/A  . Years of education: N/A   Occupational History  . Not on file.   Social History Main Topics  . Smoking status: Former Smoker    Packs/day: 2.00    Years: 15.00    Types: Cigarettes    Quit date: 02/02/1992  . Smokeless tobacco: Never Used  Comment: occ alcohol  . Alcohol use 0.0 oz/week     Comment: occasional  . Drug use: No  . Sexual activity: Not on file   Other Topics Concern  . Not on file   Social History Narrative  . No narrative on file   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  He is married.   Family History  Problem Relation Age of Onset  . Lung cancer Father   . Lung cancer Sister    Family history is notable that his father died at 17 with lung CA and his mother died at 86.  She had an irregular heart rhythm.  He has 2 sisters and one is deceased secondary to cancer and 1 brother.  ROS General: Negative; No fevers, chills, or night sweats.  Positive for morbid obesity. HEENT: He has a right eye lens  implant after cataract surgery No changes in vision or hearing, sinus congestion, difficulty swallowing Pulmonary: Negative; No cough, wheezing, shortness of breath, hemoptysis Cardiovascular: See HPI:  GI: Negative; No nausea, vomiting, diarrhea, or abdominal pain GU: Negative; No dysuria, hematuria, or difficulty voiding Musculoskeletal: History of remote right total knee replacement and left total knee replacement.  Recent low back pain from lumbar spine Hematologic: Negative; no easy bruising, bleeding Endocrine: Negative; no heat/cold intolerance; no diabetes, Neuro: Negative; no changes in balance, headaches Skin: Negative; No rashes or skin lesions Psychiatric: Negative; No behavioral problems, depression Sleep: Positive for untreated sleep apnea; positive for snoring and  hypersomnolence. Other comprehensive 14 point system review is negative   Physical Exam BP (!) 150/84   Pulse 79   Ht '5\' 11"'  (1.803 m)   Wt (!) 303 lb 6.4 oz (137.6 kg)   BMI 42.32 kg/m    Repeat blood pressure by me 134/84  Wt Readings from Last 3 Encounters:  01/25/17 (!) 303 lb 6.4 oz (137.6 kg)  01/14/17 297 lb (134.7 kg)  11/29/16 (!) 300 lb 9.6 oz (136.4 kg)    General: Alert, oriented, no distress.  Skin: normal turgor, no rashes, warm and dry HEENT: Normocephalic, atraumatic. Pupils equal round and reactive to light; sclera anicteric; extraocular muscles intact;  Nose without nasal septal hypertrophy Mouth/Parynx benign; Mallinpatti scale 3 Neck: No JVD, no carotid bruits; normal carotid upstroke Lungs: clear to ausculatation and percussion; no wheezing or rales Chest wall: without tenderness to palpitation Heart: PMI not displaced, RRR, s1 s2 normal, 1/6 systolic murmur, no diastolic murmur, no rubs, gallops, thrills, or heaves Abdomen: Significant central adiposity soft, nontender; no hepatosplenomehaly, BS+; abdominal aorta nontender and not dilated by palpation. Back: no CVA tenderness Pulses 2+ Musculoskeletal: Decreased flexibility; mild swelling of his fingers Extremities: no clubbing cyanosis or edema, Homan's sign negative  Neurologic: grossly nonfocal; Cranial nerves grossly wnl Psychologic: Normal mood and affect   ECG (independently read by me): Atrial fibrillation with ventricular rate at 79 bpm.  March 2018 ECG (independently read by me): Atrial fibrillation at 72 bpm with PVC.  September 2017 ECG (independently read by me): Atrial fibrillation with ventricular rate at 10 2 bpm.  Occasional unifocal PVCs versus a bare complex.  March 2017 ECG (independently read by me):  Atrial fibrillation at 83 bpm.  02/21/2015 ECG (independently read by me): Atrial fibrillation at 99 bpm.  01/30/2015 ECG (independently read by me): Atrial fibrillation at a  approximately 90-100 bpm.  Small Q wave in lead 3.  Early transition.  LABS:  BMP Latest Ref Rng & Units 07/23/2016 12/05/2015 04/10/2015  Glucose 70 - 99 mg/dL 107(H) 118(H) 138(H)  BUN 7 - 25 mg/dL '11 11 8  ' Creatinine 0.70 - 1.18 mg/dL 0.98 0.97 1.03  BUN/Creat Ratio 10 - 24 - 11 -  Sodium 135 - 146 mmol/L 141 148(H) 140  Potassium 3.5 - 5.3 mmol/L 4.6 4.5 4.7  Chloride 98 - 110 mmol/L 103 107(H) 103  CO2 20 - 31 mmol/L '31 26 27  ' Calcium 8.6 - 10.3 mg/dL 9.1 9.3 9.2    Hepatic Function Latest Ref Rng & Units 07/29/2016 07/23/2016 12/05/2015  Total Protein 6.1 - 8.1 g/dL - 6.5 6.8  Albumin 3.6 - 5.1 g/dL 3.7 3.8 4.1  AST 10 - 35 U/L - 18 21  ALT 9 - 46 U/L - 20 20  Alk Phosphatase 40 - 115 U/L - 58 63  Total Bilirubin 0.2 - 1.2 mg/dL - 0.5 0.3  Bilirubin, Direct 0.1 - 0.5 mg/dL - - -    CBC Latest Ref Rng & Units 07/23/2016 12/05/2015 04/10/2015  WBC 3.8 - 10.8 K/uL 6.0 6.2 12.2(H)  Hemoglobin 13.0 - 17.0 g/dL 16.2 16.5 15.8  Hematocrit 38.5 - 50.0 % 47.6 49.1 47.7  Platelets 140 - 400 K/uL 173 199 166   Lab Results  Component Value Date   MCV 94.6 07/23/2016   MCV 96 12/05/2015   MCV 95.2 04/10/2015    Lab Results  Component Value Date   TSH 0.95 07/23/2016    BNP No results found for: BNP  ProBNP No results found for: PROBNP   Lipid Panel     Component Value Date/Time   CHOL 134 07/23/2016 0805   CHOL 147 12/05/2015 0901   TRIG 103 07/23/2016 0805   HDL 50 07/23/2016 0805   HDL 55 12/05/2015 0901   CHOLHDL 2.7 07/23/2016 0805   VLDL 21 07/23/2016 0805   LDLCALC 63 07/23/2016 0805   LDLCALC 76 12/05/2015 0901     RADIOLOGY: Mr Lumbar Spine Wo Contrast  Result Date: 01/17/2017 CLINICAL DATA:  71 year old male with lumbar back pain radiating down the posterior left leg for 6 months with no known injury. Spinal injections without improvement. EXAM: MRI LUMBAR SPINE WITHOUT CONTRAST TECHNIQUE: Multiplanar, multisequence MR imaging of the lumbar spine was  performed. No intravenous contrast was administered. COMPARISON:  Lumbar MRI 11/26/2009. [facility] CT Abdomen and Pelvis 09/19/2009 FINDINGS: Segmentation: Normal as demonstrated on the 2011 CT which is the same numbering system used on the prior MRI. Alignment: Stable vertebral height and alignment since 2011. Relatively preserved lordosis. Vertebrae: No marrow edema or evidence of acute osseous abnormality. Mildly heterogeneous marrow signal is stable since 2011. Intact visible sacrum and SI joints. Conus medullaris: Extends to the L1 level and appears normal. Paraspinal and other soft tissues: Negative. Disc levels: T11-T12: Mild disc desiccation and facet hypertrophy. Borderline to mild T11 foraminal stenosis. T12-L1:  Borderline to mild facet hypertrophy. L1-L2: Minimal disc bulge and endplate spurring. Mild facet hypertrophy. No stenosis. L2-L3: Disc desiccation. Mild mostly far lateral disc bulging. Mild facet and ligament flavum hypertrophy. Mild endplate spurring. Borderline to mild L2 foraminal stenosis has not significantly changed. L3-L4: Disc desiccation. Endplate spurring. Increased circumferential disc bulge with broad-based posterior component. Mild to moderate facet and ligament flavum hypertrophy. Chronic trace left facet joint fluid. Increased flattening of the ventral thecal sac but no spinal or lateral recess stenosis. Mild bilateral L3 foraminal stenosis has not significantly changed. L4-L5: Chronic disc desiccation. The  right paracentral disc herniations seen in 2011 does not persist. There is a small residual component of left paracentral disc protrusion best seen on series 3, image 8 increased mild circumferential disc bulge. Mild to moderate facet and ligament flavum hypertrophy. Improved thecal sac and right lateral recess patency. Mild spinal stenosis. Mild to moderate left and moderate right L4 neural foraminal stenosis has increased. L5-S1: Chronic disc desiccation,  and evidence now of ankylosis through the disc space. Bulky but mostly anterior and far lateral endplate spurring. Broad-based posterior component is stable since 2011. Mild facet hypertrophy. No spinal or lateral recess stenosis. Stable mild to moderate right L5 foraminal stenosis. IMPRESSION: 1. 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 2. 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 Mild residual spinal stenosis. Mild left and up to moderate right L4 neural foraminal stenosis has increased since 2011. 3. Relatively stable chronic disc, endplate, and facet degeneration at L2-L3 and L3-L4 with up to mild bilateral foraminal stenosis at each level. Electronically Signed   By: Genevie Ann M.D.   On: 01/17/2017 09:33    IMPRESSION:  1. Essential hypertension   2. Permanent atrial fibrillation ([place])   3. OSA (obstructive sleep apnea)   4. Morbid obesity, unspecified obesity type ([place])   5. Chronic anticoagulation - Eliquis, CHADS2VASC=2     ASSESSMENT AND PLAN: Garrett Munoz is a 71 year old gentleman who has a history of morbid obesity with a body mass index of 42.32, a remote history of hypertension and a history of obstructive sleep apnea, untreated for many years.  When I saw him in November 2016, his ECG revealed atrial fibrillation of questionable duration.  He was on Eliquis anticoagulation and subsequently underwent successful cardioversion with restoration of sinus rhythm.  Echocardiography showed moderate LVH with normal systolic function with an EF of 55-60%.  He had moderate dilatation of his left atrium.  Over the last several years he has been in permanent atrial fibrillation and is on chronic anticoagulation with eliquis 5 mg twice a day.  His rate is controlled on carvedilol 18.7 5 in the morning and will 0.5 mg  in the evening.  His lipid studies are excellent without therapy.  We discussed the importance of weight loss.  He is being evaluated by Dr. Para March for his lumbar disc disease.  There is discussion concerning lumbar epidurals, he will need to hold his anticoagulation for 3-4 days rather than just 2 days.  I will see him in one year for reevaluation or sooner if problems arise.   Time spent 25 minutes [name], MD, [facility]  01/27/2017 6:45 PM

## 2017-01-26 DIAGNOSIS — M25552 Pain in left hip: Secondary | ICD-10-CM | POA: Diagnosis not present

## 2017-01-27 DIAGNOSIS — M25552 Pain in left hip: Secondary | ICD-10-CM | POA: Diagnosis not present

## 2017-02-04 ENCOUNTER — Ambulatory Visit (INDEPENDENT_AMBULATORY_CARE_PROVIDER_SITE_OTHER): Payer: PPO | Admitting: Family Medicine

## 2017-02-04 ENCOUNTER — Encounter: Payer: Self-pay | Admitting: Family Medicine

## 2017-02-04 VITALS — BP 148/86 | HR 96 | Temp 98.6°F | Resp 20 | Ht 70.5 in | Wt 310.0 lb

## 2017-02-04 DIAGNOSIS — G4700 Insomnia, unspecified: Secondary | ICD-10-CM

## 2017-02-04 MED ORDER — GABAPENTIN 300 MG PO CAPS: ORAL_CAPSULE | ORAL | 1 refills | Status: DC

## 2017-02-04 MED FILL — GABAPENTIN 300 MG CAPSULE: 300 | 30 days supply | Qty: 60 | Fill #0

## 2017-02-04 NOTE — Progress Notes (Signed)
Subjective:    Patient ID: Garrett Munoz, male    DOB: May 02, 1945, 71 y.o.   MRN: [id number]  HPI Patient reports trouble with insomnia. He is currently taking temazepam 30 mg at night. However he is unable to sleep. Recently he's been dealing with severe pain in his back and in his left hip. He was seen orthopedics and was taking Dilaudid 3 and 4 times a day for pain. He recently had an injection in his hip which has eased his pain and therefore he is no longer taking the Dilaudid however he is still unable to sleep. At best he is getting 1-2 hours at night. He also complains of diffuse body aches and pains in multiple joints. He is requesting a medication to help him sleep at night and possibly help ease his pain Past Medical History:  Diagnosis Date  . Atrial fibrillation ([place])   . Bronchitis    hx  . Cataract    right eye  . DDD (degenerative disc disease), lumbosacral   . Dysrhythmia    irregular heatbeat  . Eye worm    right eye  . Gout   . Left knee DJD   . Morbid obesity with BMI of 40.0-44.9, adult ([place])   . Pneumonia    hx  . Prediabetes   . Radicular syndrome of left leg   . S/P total knee replacement    right  . Sleep apnea    can't use sleep study 10 yrs ago  . 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    Past Surgical History:  Procedure Laterality Date  . CARDIOVERSION N/A 03/04/2015   Procedure: CARDIOVERSION;  Surgeon: [name], MD;  Location: [place];  Service: Cardiovascular;  Laterality: N/A;  . CHOLECYSTECTOMY    . COLONOSCOPY N/A 01/01/2016   Procedure: COLONOSCOPY;  Surgeon: [name], MD;  [facility];  Service: Endoscopy;  Laterality: N/A;  1:45  . COLONOSCOPY N/A 01/14/2017   Procedure: COLONOSCOPY;  Surgeon: [name], MD;  [facility];  Service: Endoscopy;  Laterality: N/A;  12:05  . EYE EXAMINATION UNDER ANESTHESIA W/ RETINAL CRYOTHERAPY AND RETINAL LASER  07/2011  . EYE SURGERY  02/2011   cat rt  . KNEE  ARTHROSCOPY     bilateral  . POLYPECTOMY  01/01/2016   Procedure: POLYPECTOMY;  Surgeon: [name], MD;  [facility];  Service: Endoscopy;;  colon  . RETINAL DETACHMENT SURGERY  03/2011   rt  . TOTAL KNEE ARTHROPLASTY  2003   rt  . TOTAL KNEE ARTHROPLASTY  02/07/2012   Procedure: TOTAL KNEE ARTHROPLASTY;  Surgeon: [name], MD;  Location: [facility];  Service: Orthopedics;  Laterality: Left;   Current Outpatient Prescriptions on File Prior to Visit  Medication Sig Dispense Refill  . acetaminophen (TYLENOL) 325 MG tablet Take 2 tablets (650 mg total) by mouth every 6 (six) hours as needed (or Fever >/= 101). (Patient taking differently: Take 650 mg by mouth every 6 (six) hours as needed for mild pain (or Fever >/= 101). )    . allopurinol (ZYLOPRIM) 300 MG tablet Take 1 tablet (300 mg total) by mouth daily. 90 tablet 3  . apixaban (ELIQUIS) 5 MG TABS tablet Take 1 tablet (5 mg total) by mouth 2 (two) times daily. 180 tablet 3  . carvedilol (COREG) 12.5 MG tablet Take 1.5 tablet in the morning and 1 tablet in the evening 225 tablet 3  . fish  oil-omega-3 fatty acids 1000 MG capsule Take 1 g by mouth daily.    . magnesium oxide (MAG-OX) 400 MG tablet Take 400 mg by mouth daily.    [name] HYDROmorphone (DILAUDID) 4 MG tablet Take 4 mg by mouth every 4 (four) hours as needed for severe pain.     No current facility-administered medications on file prior to visit.    No Known Allergies Social History   Social History  . Marital status: Married    Spouse name: N/A  . Number of children: N/A  . Years of education: N/A   Occupational History  . Not on file.   Social History Main Topics  . Smoking status: Former Smoker    Packs/day: 2.00    Years: 15.00    Types: Cigarettes    Quit date: 02/02/1992  . Smokeless tobacco: Never Used     Comment: occ alcohol  . Alcohol use 0.0 oz/week     Comment: occasional  . Drug use: No  . Sexual activity: Not on file   Other  Topics Concern  . Not on file   Social History Narrative  . No narrative on file     Review of Systems  All other systems reviewed and are negative.      Objective:   Physical Exam  Constitutional: He is oriented to person, place, and time. He appears well-developed and well-nourished. No distress.  Cardiovascular: Normal rate, regular rhythm and normal heart sounds.   Pulmonary/Chest: Effort normal and breath sounds normal. No respiratory distress. He has no wheezes. He has no rales. He exhibits no tenderness.  Neurological: He is alert and oriented to person, place, and time.  Skin: He is not diaphoretic.  Psychiatric: He has a normal mood and affect. His behavior is normal. Judgment and thought content normal.  Vitals reviewed.         Assessment & Plan:  Insomnia, unspecified type - Plan: gabapentin (NEURONTIN) 300 MG capsule  I want to avoid sedative hypnotics in a patient who is already taking temazepam as well as occasionally using pain medication to avoid accidental overdose. Therefore I recommended trying gabapentin 300 mg at night to see if this will help him sleep in a nonaddictive way and also help ease his pain. I recommended after week, he can increase to 600 mg at night. Recheck in 2 weeks

## 2017-02-11 ENCOUNTER — Ambulatory Visit (INDEPENDENT_AMBULATORY_CARE_PROVIDER_SITE_OTHER): Payer: PPO | Admitting: Family Medicine

## 2017-02-11 ENCOUNTER — Encounter: Payer: Self-pay | Admitting: Family Medicine

## 2017-02-11 VITALS — BP 136/88 | HR 78 | Temp 97.6°F | Resp 20 | Ht 70.5 in | Wt 307.0 lb

## 2017-02-11 DIAGNOSIS — K146 Glossodynia: Secondary | ICD-10-CM | POA: Diagnosis not present

## 2017-02-11 NOTE — Progress Notes (Signed)
Subjective:    Patient ID: Garrett Munoz, male    DOB: 06/22/1945, 71 y.o.   MRN: [id number]  HPI  02/04/17 Patient reports trouble with insomnia. He is currently taking temazepam 30 mg at night. However he is unable to sleep. Recently he's been dealing with severe pain in his back and in his left hip. He was seen orthopedics and was taking Dilaudid 3 and 4 times a day for pain. He recently had an injection in his hip which has eased his pain and therefore he is no longer taking the Dilaudid however he is still unable to sleep. At best he is getting 1-2 hours at night. He also complains of diffuse body aches and pains in multiple joints. He is requesting a medication to help him sleep at night and possibly help ease his pain.  AT that time, my plan was: I want to avoid sedative hypnotics in a patient who is already taking temazepam as well as occasionally using pain medication to avoid accidental overdose. Therefore I recommended trying gabapentin 300 mg at night to see if this will help him sleep in a nonaddictive way and also help ease his pain. I recommended after week, he can increase to 600 mg at night. Recheck in 2 weeks  02/11/17 Was called stating the patient had blisters and sores in his mouth and lips.  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  On exam today, there is no visible rash in the mouth, but patient states that his mouth burns like fire.  He also states that his lips burn like they are on fire despite there being no visible abnormality on exam.  Symptoms have been present for more than 7 days.  There is no visible rash anywhere else on the body.  He also reports diminished taste.  He denies any sinus pain or pressure. Past Medical History:  Diagnosis Date  . Atrial fibrillation ([place])   . Bronchitis    hx  . Cataract    right eye  . DDD (degenerative disc disease), lumbosacral   . Dysrhythmia    irregular heatbeat  . Eye worm    right eye  . Gout   . Left knee DJD   . Morbid obesity with BMI of 40.0-44.9, adult ([place])   . Pneumonia    hx  . Prediabetes   . Radicular syndrome of left leg   . S/P total knee replacement    right  . Sleep apnea    can't use sleep study 10 yrs ago  . 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    Past Surgical History:  Procedure Laterality Date  . CARDIOVERSION N/A 03/04/2015   Procedure: CARDIOVERSION;  Surgeon: [name], MD;  Location: [facility];  Service: Cardiovascular;  Laterality: N/A;  . CHOLECYSTECTOMY    . COLONOSCOPY N/A 01/01/2016   Procedure: COLONOSCOPY;  Surgeon: [name], MD;  [facility];  Service: Endoscopy;  Laterality: N/A;  1:45  . COLONOSCOPY N/A 01/14/2017   Procedure: COLONOSCOPY;  Surgeon: [name], MD;  [facility];  Service: Endoscopy;  Laterality: N/A;  12:05  . EYE EXAMINATION UNDER ANESTHESIA W/ RETINAL CRYOTHERAPY AND RETINAL LASER  07/2011  . EYE SURGERY  02/2011   cat rt  . KNEE ARTHROSCOPY     bilateral  . POLYPECTOMY  01/01/2016   Procedure: POLYPECTOMY;  Surgeon: [name], MD;  [facility];  Service: Endoscopy;;  colon  . RETINAL DETACHMENT SURGERY  03/2011   rt  . TOTAL KNEE ARTHROPLASTY  2003   rt  . TOTAL KNEE ARTHROPLASTY  02/07/2012   Procedure: TOTAL KNEE ARTHROPLASTY;  Surgeon: [name], MD;  Location: [facility];  Service: Orthopedics;  Laterality: Left;   Current Outpatient Prescriptions on File Prior to Visit  Medication Sig Dispense Refill  . acetaminophen (TYLENOL) 325 MG tablet Take 2 tablets (650 mg total) by mouth every 6 (six) hours as needed (or Fever >/= 101). (Patient taking differently: Take 650 mg by mouth every 6 (six) hours as needed for mild pain (or Fever >/= 101). )    . allopurinol (ZYLOPRIM) 300 MG tablet Take 1 tablet (300 mg total) by mouth daily. 90 tablet 3  . apixaban (ELIQUIS) 5 MG TABS tablet Take 1 tablet (5 mg total)  by mouth 2 (two) times daily. 180 tablet 3  . carvedilol (COREG) 12.5 MG tablet Take 1.5 tablet in the morning and 1 tablet in the evening 225 tablet 3  . fish oil-omega-3 fatty acids 1000 MG capsule Take 1 g by mouth daily.    [name] gabapentin (NEURONTIN) 300 MG capsule Start with 1 pill at night prior to bedtime.  After 1 week, can increase to 2 pills at night prior to bedtime. 60 capsule 1  . HYDROmorphone (DILAUDID) 4 MG tablet Take 4 mg by mouth every 4 (four) hours as needed for severe pain.    . magnesium oxide (MAG-OX) 400 MG tablet Take 400 mg by mouth daily.    . temazepam (RESTORIL) 30 MG capsule Take 30 mg by mouth at bedtime as needed for sleep.     No current facility-administered medications on file prior to visit.    No Known Allergies Social History   Social History  . Marital status: Married    Spouse name: N/A  . Number of children: N/A  . Years of education: N/A   Occupational History  . Not on file.   Social History Main Topics  . Smoking status: Former Smoker    Packs/day: 2.00    Years: 15.00    Types: Cigarettes    Quit date: 02/02/1992  . Smokeless tobacco: Never Used     Comment: occ alcohol  . Alcohol use 0.0 oz/week     Comment: occasional  . Drug use: No  . Sexual activity: Not on file   Other Topics Concern  . Not on file   Social History Narrative  . No narrative on file     Review of Systems  All other systems reviewed and are negative.      Objective:   Physical Exam  Constitutional: He is oriented to person, place, and time. He appears well-developed and well-nourished. No distress.  HENT:  Mouth/Throat: Uvula is midline and mucous membranes are normal. No oropharyngeal exudate, posterior oropharyngeal edema, posterior oropharyngeal erythema or tonsillar abscesses.  Cardiovascular: Normal rate, regular rhythm and normal heart sounds.   Pulmonary/Chest: Effort normal and breath sounds normal. No respiratory distress. He has no  wheezes. He has no rales. He exhibits no tenderness.  Neurological: He is alert and oriented to person, place, and time.  Skin: He is not diaphoretic.  Psychiatric: He has a normal mood and affect. His behavior is normal. Judgment and thought content normal.  Vitals reviewed.         Assessment & Plan:  Burning mouth Syndrome.   I have very  little concern for Stevens-Johnson syndrome after examining the patient.  However I will continue to hold gabapentin and allopurinol through next week and until hopefully symptoms are better.  Differential diagnosis includes burning mouth syndrome, cranial nerve IX neuropathy, with median rhomboid glossitis secondary to yeast despite the fact the tongue looks normal.  I will treat the patient conservatively with [employer]'s Magic mouthwash gargle swish and swallow every 6 hours times 7 days.  Reassess next week.  If burning pain is worsening, I will likely put the patient on a standing dose of gabapentin to actually treat burning mouth syndrome

## 2017-02-25 ENCOUNTER — Ambulatory Visit: Payer: PPO | Admitting: Family Medicine

## 2017-02-25 ENCOUNTER — Encounter: Payer: Self-pay | Admitting: Family Medicine

## 2017-02-25 VITALS — BP 156/100 | HR 76 | Temp 97.5°F | Resp 20 | Ht 70.5 in | Wt 306.0 lb

## 2017-02-25 DIAGNOSIS — J019 Acute sinusitis, unspecified: Secondary | ICD-10-CM

## 2017-02-25 DIAGNOSIS — B9689 Other specified bacterial agents as the cause of diseases classified elsewhere: Secondary | ICD-10-CM

## 2017-02-25 MED ORDER — AMOXICILLIN 875 MG PO TABS: 875.0000 mg | ORAL_TABLET | Freq: Two times a day (BID) | ORAL | 0 refills | Status: DC

## 2017-02-25 MED FILL — AMOXICILLIN 875 MG TABLET: 875 | 10 days supply | Qty: 20 | Fill #0

## 2017-02-25 NOTE — Progress Notes (Signed)
Subjective:    Patient ID: Garrett Munoz, male    DOB: 08-13-1945, 71 y.o.   MRN: [id number]  HPI  Symptoms began more than a week ago with fever chills and pain and pressure in both sinuses, maxillary sinuses.  Pain and pressure is worsening.  Postnasal drip is worsening.  He is also developed chest congestion and cough.  Symptoms been present now for more than 7 days and worsening Past Medical History:  Diagnosis Date  . Atrial fibrillation ([place])   . Bronchitis    hx  . Cataract    right eye  . DDD (degenerative disc disease), lumbosacral   . Dysrhythmia    irregular heatbeat  . Eye worm    right eye  . Gout   . Left knee DJD   . Morbid obesity with BMI of 40.0-44.9, adult ([place])   . Pneumonia    hx  . Prediabetes   . Radicular syndrome of left leg   . S/P total knee replacement    right  . Sleep apnea    can't use sleep study 10 yrs ago  . 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    Past Surgical History:  Procedure Laterality Date  . CHOLECYSTECTOMY    . EYE EXAMINATION UNDER ANESTHESIA W/ RETINAL CRYOTHERAPY AND RETINAL LASER  07/2011  . EYE SURGERY  02/2011   cat rt  . KNEE ARTHROSCOPY     bilateral  . RETINAL DETACHMENT SURGERY  03/2011   rt  . TOTAL KNEE ARTHROPLASTY  2003   rt   Current Outpatient Medications on File Prior to Visit  Medication Sig Dispense Refill  . acetaminophen (TYLENOL) 325 MG tablet Take 2 tablets (650 mg total) by mouth every 6 (six) hours as needed (or Fever >/= 101). (Patient taking differently: Take 650 mg by mouth every 6 (six) hours as needed for mild pain (or Fever >/= 101). )    . apixaban (ELIQUIS) 5 MG TABS tablet Take 1 tablet (5 mg total) by mouth 2 (two) times daily. 180 tablet 3  . carvedilol (COREG) 12.5 MG tablet Take 1.5 tablet in the morning and 1 tablet in the evening 225 tablet 3  . fish oil-omega-3 fatty acids 1000 MG capsule Take 1 g by mouth daily.    [name] gabapentin (NEURONTIN) 300 MG capsule Start with 1 pill at night prior  to bedtime.  After 1 week, can increase to 2 pills at night prior to bedtime. 60 capsule 1  . HYDROmorphone (DILAUDID) 4 MG tablet Take 4 mg by mouth every 4 (four) hours as needed for severe pain.    . magnesium oxide (MAG-OX) 400 MG tablet Take 400 mg by mouth daily.    . temazepam (RESTORIL) 30 MG capsule Take 30 mg by mouth at bedtime as needed for sleep.    [name] allopurinol (ZYLOPRIM) 300 MG tablet Take 1 tablet (300 mg total) by mouth daily. (Patient not taking: Reported on 02/11/2017) 90 tablet 3   No current facility-administered medications on file prior to visit.    No Known Allergies Social History   Socioeconomic History  . Marital status: Married    Spouse name: Not on file  . Number of children: Not on file  . Years of education: Not on file  . Highest education level: Not on file  Social Needs  . Financial resource strain: Not on file  . Food insecurity - worry: Not on file  . Food insecurity - inability: Not on file  .  Transportation needs - medical: Not on file  . Transportation needs - non-medical: Not on file  Occupational History  . Not on file  Tobacco Use  . Smoking status: Former Smoker    Packs/day: 2.00    Years: 15.00    Pack years: 30.00    Types: Cigarettes    Last attempt to quit: 02/02/1992    Years since quitting: 25.0  . Smokeless tobacco: Never Used  . Tobacco comment: occ alcohol  Substance and Sexual Activity  . Alcohol use: Yes    Alcohol/week: 0.0 oz    Comment: occasional  . Drug use: No  . Sexual activity: Not on file  Other Topics Concern  . Not on file  Social History Narrative  . Not on file     Review of Systems  All other systems reviewed and are negative.      Objective:   Physical Exam  Constitutional: He is oriented to person, place, and time. He appears well-developed and well-nourished. No distress.  HENT:  Right Ear: Tympanic membrane and ear canal normal.  Left Ear: Tympanic membrane and ear canal normal.    Nose: Mucosal edema and rhinorrhea present. Right sinus exhibits maxillary sinus tenderness. Left sinus exhibits maxillary sinus tenderness.  Mouth/Throat: Uvula is midline and mucous membranes are normal. No oropharyngeal exudate, posterior oropharyngeal edema, posterior oropharyngeal erythema or tonsillar abscesses.  Cardiovascular: Normal rate, regular rhythm and normal heart sounds.  Pulmonary/Chest: Effort normal and breath sounds normal. No respiratory distress. He has no wheezes. He has no rales. He exhibits no tenderness.  Neurological: He is alert and oriented to person, place, and time.  Skin: He is not diaphoretic.  Psychiatric: He has a normal mood and affect. His behavior is normal. Judgment and thought content normal.  Vitals reviewed.         Assessment & Plan:  Acute bacterial rhinosinusitis  Discontinue Sudafed.  Continue Mucinex.  Add amoxicillin 875 mg p.o. twice daily for 10 days.  Recheck in 1 week if no better sooner if worse

## 2017-03-04 ENCOUNTER — Telehealth: Payer: Self-pay | Admitting: Family Medicine

## 2017-03-04 MED FILL — GABAPENTIN 300 MG CAPSULE: 300 | 30 days supply | Qty: 60 | Fill #1

## 2017-03-04 NOTE — Telephone Encounter (Signed)
Requesting refill on Temazepam - Ok to refill??

## 2017-03-07 ENCOUNTER — Other Ambulatory Visit: Payer: Self-pay | Admitting: Physical Medicine and Rehabilitation

## 2017-03-07 DIAGNOSIS — M7918 Myalgia, other site: Secondary | ICD-10-CM

## 2017-03-07 DIAGNOSIS — M25552 Pain in left hip: Secondary | ICD-10-CM | POA: Diagnosis not present

## 2017-03-07 DIAGNOSIS — R1032 Left lower quadrant pain: Secondary | ICD-10-CM

## 2017-03-07 MED ORDER — TEMAZEPAM 30 MG PO CAPS: 30.0000 mg | ORAL_CAPSULE | Freq: Every evening | ORAL | 1 refills | Status: DC | PRN

## 2017-03-07 MED FILL — TEMAZEPAM 30 MG CAPSULE: 30 | 30 days supply | Qty: 30 | Fill #0

## 2017-03-07 NOTE — Telephone Encounter (Signed)
RX called in .

## 2017-03-07 NOTE — Telephone Encounter (Signed)
ok 

## 2017-03-08 ENCOUNTER — Telehealth: Payer: Self-pay | Admitting: Cardiovascular Disease

## 2017-03-08 NOTE — Telephone Encounter (Signed)
F/U Call:  Patient wife calling, states that she would like to know the status of the request.

## 2017-03-08 NOTE — Telephone Encounter (Signed)
Pt wants to know if you received his surgical clearance from [facility] or [facility]?Pt needs to have  A MRI?

## 2017-03-08 NOTE — Telephone Encounter (Signed)
   [facility] Pre-operative Risk Assessment    Request for surgical clearance:  1. What type of surgery is being performed? MR ARTHROGRAM OF LEFT HIP    2. When is this surgery scheduled? TBD   3. Are there any medications that need to be held prior to surgery and how long?NO CLEARANCE NEEDED JUST APPROVAL FOR HOLDING  ELIQUIS FOR 48 HOURS,   4. Practice name and name of physician performing surgery? [facility]   5. What is your office phone and fax number? PHONE [phone] FAX [phone]

## 2017-03-08 NOTE — Telephone Encounter (Signed)
Do not know if clearance form has been received yet - this is needed prior to clearance. Once we have clearance form, anticoag recommendations as follows:  Pt takes Eliquis for afib with CHADS2VASc score of 2 (CHF and HTN). Renal function is normal. Ok to hold Eliquis for 2 days prior per request.

## 2017-03-08 NOTE — Telephone Encounter (Signed)
Spoke w Andee Poles at [facility]  -- [phone]. She states [facility] was contacted this AM and will send a new fax request for clearance - pt will need instructions for his Eliquis from prescriber per [employer].

## 2017-03-08 NOTE — Telephone Encounter (Signed)
Called patient back, actually spoke to wife. She states he needs OK to do an MRI w contrast and the orthopedic office recommended a 2 day hold to his Eliquis. Informed her we haven't received a clearance request as I'm aware (reviewed recent telephone notes and paper requests). Informed her I would get in touch w [facility] to get clarification on what they need (may not need interruption to Eliquis). Called office, spent 10 mins on hold, no answer.   Called back wife and told her we would need to reattempt to get in touch w office later, she voiced understanding and thanks.

## 2017-03-14 ENCOUNTER — Telehealth: Payer: Self-pay | Admitting: Cardiovascular Disease

## 2017-03-14 NOTE — Telephone Encounter (Signed)
    Instructions for holding Eliquis prior to his MRI have already been addressed by Pharmacy as outlined in prior notes and below.   Instructions are: Pt takes Eliquis for afib with CHADS2VASc score of 2 (CHF and HTN). Renal function is normal. Ok to hold Eliquis for 2 days prior per request.   I will route this recommendation to the requesting party via [facility] fax function and remove from pre-op pool.  Erma Heritage, PA-C 03/14/2017, 1:43 PM

## 2017-03-14 NOTE — Telephone Encounter (Signed)
Follow up        [place] Pre-operative Risk Assessment    Request for surgical clearance:  1. What type of surgery is being performed? Mri arthrogram   2. When is this surgery scheduled? Not scheduled  3. Are there any medications that need to be held prior to surgery and how long? Blood thinner   4. Practice name and name of physician performing surgery?  Doesn't know   5. What is your office phone and fax number?  [phone] fax [phone]  6. Anesthesia type (None, local, MAC, general) ?  Local , numbing in left hip  For injection then he will get MRI    Howie Ill 03/14/2017, 8:27 AM  _________________________________________________________________   (provider comments below)

## 2017-03-25 ENCOUNTER — Ambulatory Visit
Admission: RE | Admit: 2017-03-25 | Discharge: 2017-03-25 | Disposition: A | Discharge status: Home / Self Care | Payer: PPO | Source: Ambulatory Visit | Attending: Physical Medicine and Rehabilitation | Admitting: Physical Medicine and Rehabilitation

## 2017-03-25 DIAGNOSIS — S73192A Other sprain of left hip, initial encounter: Secondary | ICD-10-CM | POA: Diagnosis not present

## 2017-03-25 DIAGNOSIS — R1032 Left lower quadrant pain: Secondary | ICD-10-CM

## 2017-03-25 DIAGNOSIS — M7918 Myalgia, other site: Secondary | ICD-10-CM

## 2017-03-25 DIAGNOSIS — M25552 Pain in left hip: Secondary | ICD-10-CM | POA: Diagnosis not present

## 2017-03-25 MED ORDER — IOPAMIDOL (ISOVUE-M 200) INJECTION 41%
15.0000 mL | Freq: Once | INTRAMUSCULAR | Status: AC
  Administered 2017-03-25: 12 mL via INTRA_ARTICULAR

## 2017-03-25 NOTE — Discharge Instructions (Signed)
Spinal Injection Discharge Instruction Sheet ° °1. You may resume a regular diet and any medications that you routinely take, including pain medications. ° °2. No driving the rest of the day of the procedure. ° °3. Light activity throughout the rest of the day.  Do not do any strenuous work, exercise, bending or lifting.  The day following the procedure, you may resume normal physical activity but you should refrain from exercising or physical therapy for at least three days. ° ° °Common Side Effects: ° °· Headaches- take your usual medications as directed by your physician.   ° °· Restlessness or inability to sleep- you may have trouble sleeping for the next few days.  Ask your referring physician if you need any medication for sleep if over the counter sleep medications do not help. ° °· Facial flushing or redness- this should subside within a few days. ° °· Increased pain- a temporary increase in pain a day or two following your procedure is not unusual.  Take your pain medication as prescribed by your referring physician.  You may use ice to the injection site as needed.  Please do not use heat for 24 hours. ° °· Leg cramps ° °Please contact our office at [phone] for the following symptoms: °· Fever greater than 100 degrees. °· Headaches unresolved with medication after 2-3 days. °· Increased swelling, pain, or redness at injection site. ° °Thank you for visiting our office. ° ° °You may resume Eliquis today. °

## 2017-03-28 ENCOUNTER — Ambulatory Visit: Payer: PPO | Admitting: Family Medicine

## 2017-03-31 ENCOUNTER — Ambulatory Visit: Payer: PPO | Admitting: Family Medicine

## 2017-04-01 DIAGNOSIS — M25551 Pain in right hip: Secondary | ICD-10-CM | POA: Diagnosis not present

## 2017-04-01 DIAGNOSIS — M25552 Pain in left hip: Secondary | ICD-10-CM | POA: Diagnosis not present

## 2017-04-05 ENCOUNTER — Other Ambulatory Visit: Payer: Self-pay | Admitting: Family Medicine

## 2017-04-05 DIAGNOSIS — G4700 Insomnia, unspecified: Secondary | ICD-10-CM

## 2017-04-05 MED FILL — TEMAZEPAM 30 MG CAPSULE: 30 | 30 days supply | Qty: 30 | Fill #1

## 2017-04-05 MED FILL — CARVEDILOL 12.5 MG TABLET: 12.5 | 90 days supply | Qty: 225 | Fill #3

## 2017-04-05 MED FILL — GABAPENTIN 300 MG CAPSULE: 300 | 30 days supply | Qty: 60 | Fill #0

## 2017-04-05 MED FILL — ALLOPURINOL 300 MG TABLET: 300 | 90 days supply | Qty: 90 | Fill #1

## 2017-04-14 ENCOUNTER — Telehealth: Payer: Self-pay | Admitting: Family Medicine

## 2017-04-14 NOTE — Telephone Encounter (Signed)
pt's wife called [facility] stating that pt is in severe pain and can not get out of bed and she would like to know what to do? She states that he needs surgery but ortho(?) says they can not do anything for him until he loses weight.

## 2017-04-14 NOTE — Telephone Encounter (Signed)
Spoke to wife and he is having back pain and hip pain but he sees Dr. Noemi Chapel for this and they have done MRI's on him and they do have a call into them. Pt states that he would like to go see the specialist instead of coming in to see [place].

## 2017-04-14 NOTE — Telephone Encounter (Signed)
Pain from what? Need more to go on.  What is he taking at home?

## 2017-05-04 MED FILL — GABAPENTIN 300 MG CAPSULE: 300 | 30 days supply | Qty: 60 | Fill #1

## 2017-05-06 ENCOUNTER — Other Ambulatory Visit: Payer: Self-pay | Admitting: Family Medicine

## 2017-05-06 ENCOUNTER — Telehealth: Payer: Self-pay | Admitting: Family Medicine

## 2017-05-06 MED ORDER — TEMAZEPAM 30 MG PO CAPS: 30.0000 mg | ORAL_CAPSULE | Freq: Every evening | ORAL | 1 refills | Status: DC | PRN

## 2017-05-06 MED FILL — TEMAZEPAM 30 MG CAPSULE: 30 | 30 days supply | Qty: 30 | Fill #0

## 2017-05-06 NOTE — Telephone Encounter (Signed)
[facility]  Patient needs refill on his temazepam will be out today  [phone]

## 2017-05-06 NOTE — Telephone Encounter (Signed)
Re ordered

## 2017-05-06 NOTE — Telephone Encounter (Signed)
Requesting refill temazepam      LOV: 02/25/17  LRF:  03/07/17 #30/1

## 2017-05-25 DIAGNOSIS — M25552 Pain in left hip: Secondary | ICD-10-CM | POA: Diagnosis not present

## 2017-05-25 DIAGNOSIS — M545 Low back pain: Secondary | ICD-10-CM | POA: Diagnosis not present

## 2017-05-27 ENCOUNTER — Ambulatory Visit (INDEPENDENT_AMBULATORY_CARE_PROVIDER_SITE_OTHER): Payer: PPO | Admitting: Family Medicine

## 2017-05-27 ENCOUNTER — Encounter: Payer: Self-pay | Admitting: Family Medicine

## 2017-05-27 VITALS — BP 150/88 | HR 64 | Temp 98.4°F | Resp 18 | Ht 70.5 in | Wt 306.0 lb

## 2017-05-27 DIAGNOSIS — F5101 Primary insomnia: Secondary | ICD-10-CM | POA: Diagnosis not present

## 2017-05-27 DIAGNOSIS — M25552 Pain in left hip: Secondary | ICD-10-CM

## 2017-05-27 DIAGNOSIS — M5442 Lumbago with sciatica, left side: Secondary | ICD-10-CM

## 2017-05-27 DIAGNOSIS — G8929 Other chronic pain: Secondary | ICD-10-CM

## 2017-05-27 MED ORDER — NORTRIPTYLINE HCL 50 MG PO CAPS: 50.0000 mg | ORAL_CAPSULE | Freq: Every day | ORAL | 1 refills | Status: DC

## 2017-05-27 MED FILL — NORTRIPTYLINE HCL 50 MG CAP: 50 | 30 days supply | Qty: 30 | Fill #0

## 2017-05-27 NOTE — Progress Notes (Signed)
Subjective:    Patient ID: Garrett Munoz, male    DOB: Jan 29, 1946, 72 y.o.   MRN: [id number]  HPI Patient is here today requesting an increased dose of temazepam for insomnia.  He is currently on 30 mg a night to help him sleep.  He states that this does not work for him.  He stays awake all night long tossing and turning in bed due to the severe pain in his lower back.  He originally was seeing an orthopedist in [place] who performed an MRI of the lumbar spine which revealed: 1. 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 2. 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 Mild residual spinal stenosis. Mild left and up to moderate right L4 neural foraminal stenosis has increased since 2011. 3. Relatively stable chronic disc, endplate, and facet degeneration at L2-L3 and L3-L4 with up to mild bilateral foraminal stenosis at each level. Epidural steroid injections in his back did not help the pain nor the left hip pain.  An MRI was then obtained of the left hip which revealed a small labral tear: . Small tear of the superior anterior left labrum. 2. Mild partial-thickness cartilage loss of bilateral hips. 3. Small tear of the left gluteus minimus tendon insertion. 4. Lower lumbar spine spondylosis partially visualized. However cortisone injections in the left hip did not provide him any pain relief.  He has subsequently been referred to a different orthopedist for a second opinion.  They have him scheduled to meet with their interventional radiologist tomorrow to facilitate pain management and also to try repeated steroid injections.  He states that the pain is keeping him awake at night. Past Medical History:  Diagnosis Date  . Atrial fibrillation ([place])   . Bronchitis    hx  . Cataract    right eye  . DDD  (degenerative disc disease), lumbosacral   . Dysrhythmia    irregular heatbeat  . Eye worm    right eye  . Gout   . Left knee DJD   . Morbid obesity with BMI of 40.0-44.9, adult ([place])   . Pneumonia    hx  . Prediabetes   . Radicular syndrome of left leg   . S/P total knee replacement    right  . Sleep apnea    can't use sleep study 10 yrs ago  . 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    Past Surgical History:  Procedure Laterality Date  . CARDIOVERSION N/A 03/04/2015   Procedure: CARDIOVERSION;  Surgeon: [name], MD;  Location: [facility];  Service: Cardiovascular;  Laterality: N/A;  . CHOLECYSTECTOMY    . COLONOSCOPY N/A 01/01/2016   Procedure: COLONOSCOPY;  Surgeon: [name], MD;  [facility];  Service: Endoscopy;  Laterality: N/A;  1:45  . COLONOSCOPY N/A 01/14/2017   Procedure: COLONOSCOPY;  Surgeon: [name], MD;  [facility];  Service: Endoscopy;  Laterality: N/A;  12:05  . EYE EXAMINATION UNDER ANESTHESIA W/ RETINAL CRYOTHERAPY AND RETINAL LASER  07/2011  . EYE SURGERY  02/2011   cat rt  . KNEE ARTHROSCOPY     bilateral  . POLYPECTOMY  01/01/2016   Procedure: POLYPECTOMY;  Surgeon: [name], MD;  [facility];  Service: Endoscopy;;  colon  . RETINAL DETACHMENT SURGERY  03/2011  rt  . TOTAL KNEE ARTHROPLASTY  2003   rt  . TOTAL KNEE ARTHROPLASTY  02/07/2012   Procedure: TOTAL KNEE ARTHROPLASTY;  Surgeon: [name], MD;  Location: [facility];  Service: Orthopedics;  Laterality: Left;   Current Outpatient Medications on File Prior to Visit  Medication Sig Dispense Refill  . acetaminophen (TYLENOL) 325 MG tablet Take 2 tablets (650 mg total) by mouth every 6 (six) hours as needed (or Fever >/= 101).    [name] allopurinol (ZYLOPRIM) 300 MG tablet Take 1 tablet (300 mg total) by mouth daily. 90 tablet 3  . apixaban (ELIQUIS) 5 MG TABS tablet Take 1 tablet (5 mg total) by mouth 2 (two) times daily. 180 tablet 3  .  carvedilol (COREG) 12.5 MG tablet Take 1.5 tablet in the morning and 1 tablet in the evening 225 tablet 3  . fish oil-omega-3 fatty acids 1000 MG capsule Take 1 g by mouth daily.    . magnesium oxide (MAG-OX) 400 MG tablet Take 400 mg by mouth daily.    . temazepam (RESTORIL) 30 MG capsule Take 1 capsule (30 mg total) by mouth at bedtime as needed for sleep. 30 capsule 1  . gabapentin (NEURONTIN) 300 MG capsule START WITH 1 CAPSULE BY MOUTH AT NIGHT BEDTIME; AFTER 1 WEEK INCREASE TO 2 CAPSULES AT NIGHT PRIOR TO BEDTIME (Patient not taking: Reported on 05/27/2017) 60 capsule 1  . HYDROmorphone (DILAUDID) 4 MG tablet Take 4 mg by mouth every 4 (four) hours as needed for severe pain.     No current facility-administered medications on file prior to visit.    No Known Allergies Social History   Socioeconomic History  . Marital status: Married    Spouse name: Not on file  . Number of children: Not on file  . Years of education: Not on file  . Highest education level: Not on file  Social Needs  . Financial resource strain: Not on file  . Food insecurity - worry: Not on file  . Food insecurity - inability: Not on file  . Transportation needs - medical: Not on file  . Transportation needs - non-medical: Not on file  Occupational History  . Not on file  Tobacco Use  . Smoking status: Former Smoker    Packs/day: 2.00    Years: 15.00    Pack years: 30.00    Types: Cigarettes    Last attempt to quit: 02/02/1992    Years since quitting: 25.3  . Smokeless tobacco: Never Used  . Tobacco comment: occ alcohol  Substance and Sexual Activity  . Alcohol use: Yes    Alcohol/week: 0.0 oz    Comment: occasional  . Drug use: No  . Sexual activity: Not on file  Other Topics Concern  . Not on file  Social History Narrative  . Not on file     Review of Systems  All other systems reviewed and are negative.      Objective:   Physical Exam  Constitutional: He appears well-developed and  well-nourished. No distress.  Cardiovascular: Normal rate, regular rhythm and normal heart sounds.  Pulmonary/Chest: Effort normal and breath sounds normal. No respiratory distress. He has no wheezes. He has no rales.  Skin: He is not diaphoretic.  Vitals reviewed.         Assessment & Plan:  Primary insomnia - Plan: nortriptyline (PAMELOR) 50 MG capsule  Left hip pain  Chronic left-sided low back pain with left-sided sciatica  I explained to the patient  that I do not want to increase the dose of temazepam given his age and the chance that he may be taking pain medication for his back pain in his hip pain.  I reviewed with him the increased risk of respiratory depression on high-dose benzodiazepines particularly coupled with opiate pain medication.  Therefore I would like to try to find a way to help him sleep but also help manage his pain without using opiates or increased doses of benzodiazepines.  I recommended trying nortriptyline 50 mg p.o. nightly.  I explained to the patient that this may help with some neuropathic pain.  It also has anticholinergic effects which may help with sleep.  Patient will try this medication.  Monitor for dry mouth, constipation, or urinary retention.

## 2017-05-28 DIAGNOSIS — M51379 Other intervertebral disc degeneration, lumbosacral region without mention of lumbar back pain or lower extremity pain: Secondary | ICD-10-CM | POA: Insufficient documentation

## 2017-05-28 DIAGNOSIS — M5137 Other intervertebral disc degeneration, lumbosacral region: Secondary | ICD-10-CM | POA: Insufficient documentation

## 2017-05-28 DIAGNOSIS — M545 Low back pain, unspecified: Secondary | ICD-10-CM | POA: Insufficient documentation

## 2017-05-28 DIAGNOSIS — M5136 Other intervertebral disc degeneration, lumbar region: Secondary | ICD-10-CM | POA: Diagnosis not present

## 2017-05-28 DIAGNOSIS — M533 Sacrococcygeal disorders, not elsewhere classified: Secondary | ICD-10-CM | POA: Diagnosis not present

## 2017-05-28 DIAGNOSIS — M5416 Radiculopathy, lumbar region: Secondary | ICD-10-CM | POA: Diagnosis not present

## 2017-05-30 MED FILL — HYDROCODON-APAP 10-325: 10-325 | 7 days supply | Qty: 30 | Fill #0

## 2017-06-03 MED FILL — TEMAZEPAM 30 MG CAPSULE: 30 | 30 days supply | Qty: 30 | Fill #1

## 2017-06-08 DIAGNOSIS — M533 Sacrococcygeal disorders, not elsewhere classified: Secondary | ICD-10-CM | POA: Diagnosis not present

## 2017-06-15 MED FILL — predniSONE 5 MG TABS: 5 | 6 days supply | Qty: 21 | Fill #0

## 2017-06-21 ENCOUNTER — Other Ambulatory Visit: Payer: Self-pay | Admitting: Family Medicine

## 2017-06-22 ENCOUNTER — Other Ambulatory Visit: Payer: Self-pay | Admitting: Family Medicine

## 2017-06-22 DIAGNOSIS — M25551 Pain in right hip: Secondary | ICD-10-CM | POA: Diagnosis not present

## 2017-06-22 DIAGNOSIS — M545 Low back pain: Secondary | ICD-10-CM | POA: Diagnosis not present

## 2017-06-22 MED ORDER — CLOTRIMAZOLE 10 MG MT TROC: 10.0000 mg | Freq: Every day | OROMUCOSAL | 1 refills | Status: DC

## 2017-06-22 MED FILL — HYDROCODON-APAP 10-325: 10-325 | 7 days supply | Qty: 30 | Fill #0

## 2017-06-22 MED FILL — CLOTRIMAZOLE 10 MG TROCHE: 10 | 14 days supply | Qty: 70 | Fill #0

## 2017-06-27 DIAGNOSIS — M5416 Radiculopathy, lumbar region: Secondary | ICD-10-CM | POA: Diagnosis not present

## 2017-06-29 DIAGNOSIS — M5380 Other specified dorsopathies, site unspecified: Secondary | ICD-10-CM | POA: Diagnosis not present

## 2017-06-29 DIAGNOSIS — M5136 Other intervertebral disc degeneration, lumbar region: Secondary | ICD-10-CM | POA: Diagnosis not present

## 2017-06-29 MED FILL — OXYCODONE-ACETAMINOPHEN 10-: 10-325 | 5 days supply | Qty: 15 | Fill #0

## 2017-07-01 ENCOUNTER — Other Ambulatory Visit: Payer: Self-pay | Admitting: Family Medicine

## 2017-07-01 ENCOUNTER — Other Ambulatory Visit: Payer: Self-pay | Admitting: Cardiovascular Disease

## 2017-07-01 MED FILL — CARVEDILOL 12.5 MG TABLET: 12.5 | 90 days supply | Qty: 225 | Fill #0

## 2017-07-01 MED FILL — ALLOPURINOL 300 MG TABLET: 300 | 90 days supply | Qty: 90 | Fill #2

## 2017-07-01 MED FILL — NORTRIPTYLINE HCL 50 MG CAP: 50 | 30 days supply | Qty: 30 | Fill #1

## 2017-07-01 MED FILL — TEMAZEPAM 30 MG CAPSULE: 30 | 30 days supply | Qty: 30 | Fill #0

## 2017-07-01 NOTE — Telephone Encounter (Signed)
Requesting refill   Temazepam  LOV: 05/27/17 LRF:  05/06/17

## 2017-07-04 MED FILL — ELIQUIS 5 MG TABLET: 5 | 90 days supply | Qty: 180 | Fill #0

## 2017-07-19 ENCOUNTER — Telehealth: Payer: Self-pay | Admitting: *Deleted

## 2017-07-19 DIAGNOSIS — M48061 Spinal stenosis, lumbar region without neurogenic claudication: Secondary | ICD-10-CM | POA: Diagnosis not present

## 2017-07-19 DIAGNOSIS — I4891 Unspecified atrial fibrillation: Secondary | ICD-10-CM | POA: Diagnosis not present

## 2017-07-19 DIAGNOSIS — M5416 Radiculopathy, lumbar region: Secondary | ICD-10-CM | POA: Diagnosis not present

## 2017-07-19 DIAGNOSIS — M4800 Spinal stenosis, site unspecified: Secondary | ICD-10-CM | POA: Diagnosis not present

## 2017-07-19 DIAGNOSIS — Z6841 Body Mass Index (BMI) 40.0 and over, adult: Secondary | ICD-10-CM | POA: Diagnosis not present

## 2017-07-19 DIAGNOSIS — M5136 Other intervertebral disc degeneration, lumbar region: Secondary | ICD-10-CM | POA: Diagnosis not present

## 2017-07-19 MED FILL — OXYCODONE-ACETAMINOPHEN 10-: 10-325 | 10 days supply | Qty: 30 | Fill #0

## 2017-07-19 NOTE — Telephone Encounter (Signed)
   [facility] Pre-operative Risk Assessment    Request for surgical clearance:  1. What type of surgery is being performed? Lumbar decompression  2. When is this surgery scheduled? TBD  3. What type of clearance is required (medical clearance vs. Pharmacy clearance to hold med vs. Both)? PHARMACY  4. Are there any medications that need to be held prior to surgery and how long?ELIQUIS  5. Practice name and name of physician performing surgery? [facility]  6. What is your office phone and fax number? [phone] Fax number [phone] 7. Anesthesia type (None, local, MAC, general) ? NOT SPECIFIED   Devra Dopp 07/19/2017, 10:40 AM  _________________________________________________________________   (provider comments below)

## 2017-07-21 NOTE — Telephone Encounter (Signed)
Patient with diagnosis of atrial fibrillation on Eliquis for anticoagulation.    Procedure: lumbar decompression Date of procedure: TBD  CHADS2-VASc score of  3 (CHF, HTN, AGE,)  CrCl 131.8 Platelet count 173  Per office protocol, patient can hold Eliquis for 3 days prior to procedure.    Patient should restart Eliquis on the day after, at discretion of procedure MD

## 2017-07-22 NOTE — Telephone Encounter (Signed)
Faxed to [facility] with Pharm D recommendations.

## 2017-07-25 ENCOUNTER — Telehealth: Payer: Self-pay | Admitting: Cardiovascular Disease

## 2017-07-25 NOTE — Telephone Encounter (Signed)
New message    Spouse calling, requesting status of clearance . Please RE-FAX clearance   5. Practice name and name of physician performing surgery? [facility]  6. What is your office phone and fax number? [phone] Fax number [phone]

## 2017-07-25 NOTE — Telephone Encounter (Signed)
   Primary Cardiologist: Dr Claiborne Billings  Chart reviewed and patient interviewed over the phone as part of pre-operative protocol coverage. Given past medical history and time since last visit, based on ACC/AHA guidelines, ELJAY LAVE would be at acceptable risk for the planned procedure without further cardiovascular testing.   He should hold his Eliquis three days pre op and resume when safe post op.   I will route this recommendation to the requesting party via Epic fax function and remove from pre-op pool.  Please call with questions.  Kerin Ransom, PA-C 07/25/2017, 4:46 PM

## 2017-07-29 ENCOUNTER — Other Ambulatory Visit: Payer: Self-pay | Admitting: Family Medicine

## 2017-07-29 DIAGNOSIS — F5101 Primary insomnia: Secondary | ICD-10-CM

## 2017-07-29 MED FILL — NORTRIPTYLINE HCL 50 MG CAP: 50 | 30 days supply | Qty: 30 | Fill #0

## 2017-07-29 MED FILL — TEMAZEPAM 30 MG CAPSULE: 30 | 30 days supply | Qty: 30 | Fill #1

## 2017-08-01 ENCOUNTER — Ambulatory Visit: Payer: Self-pay | Admitting: Orthopedic Surgery

## 2017-08-09 NOTE — Pre-Procedure Instructions (Signed)
Garrett Munoz  08/09/2017      [place], [place] - [address] [facility]. [address] [place] [address] Phone: [phone] Fax: [phone]  [facility] [place] [address] [place] - [place] [address] [place] [address] [place] [address] [place] [address] Phone: [phone] Fax: [phone]    Your procedure is scheduled on May 2.  Report to [facility] Admitting at 530 A.M.  Call this number if you have problems the morning of surgery:  [phone]   Remember:  Do not eat food or drink liquids after midnight.  Take these medicines the morning of surgery with A SIP OF WATER allopurinol (Zyloprim), carvedilol (Coreg),oxycodone (percocet) if needed  Stop/take Eliquis as directed by your Dr.  Stop taking aspirin, BC's, Goody's, Herbal medications, Fish Oil, Aleve, vitamins, Ibuprofen, advil, Motrin   Do not wear jewelry, make-up or nail polish.  Do not wear lotions, powders, or perfumes, or deodorant.  Do not shave 48 hours prior to surgery.  Men may shave face and neck.  Do not bring valuables to the hospital.  [facility] is not responsible for any belongings or valuables.  Contacts, dentures or bridgework may not be worn into surgery.  Leave your suitcase in the car.  After surgery it may be brought to your room.  For patients admitted to the hospital, discharge time will be determined by your treatment team.  Patients discharged the day of surgery will not be allowed to drive home.   Special instructions:   [facility]- Preparing For Surgery  Before surgery, you can play an important role. Because skin is not sterile, your skin needs to be as free of germs as possible. You can reduce the number of germs on your skin by washing with CHG (chlorahexidine gluconate) Soap before surgery.  CHG is an antiseptic cleaner which kills germs and bonds with the skin to continue killing germs even after washing.  Please do not use  if you have an allergy to CHG or antibacterial soaps. If your skin becomes reddened/irritated stop using the CHG.  Do not shave (including legs and underarms) for at least 48 hours prior to first CHG shower. It is OK to shave your face.  Please follow these instructions carefully.   1. Shower the NIGHT BEFORE SURGERY and the MORNING OF SURGERY with CHG.   2. If you chose to wash your hair, wash your hair first as usual with your normal shampoo.  3. After you shampoo, rinse your hair and body thoroughly to remove the shampoo.  4. Use CHG as you would any other liquid soap. You can apply CHG directly to the skin and wash gently with a scrungie or a clean washcloth.   5. Apply the CHG Soap to your body ONLY FROM THE NECK DOWN.  Do not use on open wounds or open sores. Avoid contact with your eyes, ears, mouth and genitals (private parts). Wash Face and genitals (private parts)  with your normal soap.  6. Wash thoroughly, paying special attention to the area where your surgery will be performed.  7. Thoroughly rinse your body with warm water from the neck down.  8. DO NOT shower/wash with your normal soap after using and rinsing off the CHG Soap.  9. Pat yourself dry with a CLEAN TOWEL.  10. Wear CLEAN PAJAMAS to bed the night before surgery, wear comfortable clothes the morning of surgery  11. Place CLEAN SHEETS on your  bed the night of your first shower and DO NOT SLEEP WITH PETS.    Day of Surgery: Do not apply any deodorants/lotions. Please wear clean clothes to the hospital/surgery center.      Please read over the following fact sheets that you were given. Pain Booklet, Coughing and Deep Breathing, MRSA Information and Surgical Site Infection Prevention

## 2017-08-10 ENCOUNTER — Ambulatory Visit: Payer: Self-pay | Admitting: Orthopedic Surgery

## 2017-08-10 ENCOUNTER — Ambulatory Visit (HOSPITAL_COMMUNITY)
Admission: RE | Admit: 2017-08-10 | Discharge: 2017-08-10 | Disposition: A | Discharge status: Home / Self Care | Payer: PPO | Source: Ambulatory Visit | Attending: Orthopedic Surgery | Admitting: Orthopedic Surgery

## 2017-08-10 ENCOUNTER — Encounter (HOSPITAL_COMMUNITY)
Admission: RE | Admit: 2017-08-10 | Discharge: 2017-08-10 | Disposition: A | Discharge status: Home / Self Care | Payer: PPO | Source: Ambulatory Visit | Attending: Orthopedic Surgery | Admitting: Orthopedic Surgery

## 2017-08-10 ENCOUNTER — Other Ambulatory Visit: Payer: Self-pay

## 2017-08-10 ENCOUNTER — Encounter (HOSPITAL_COMMUNITY): Payer: Self-pay

## 2017-08-10 DIAGNOSIS — M47816 Spondylosis without myelopathy or radiculopathy, lumbar region: Secondary | ICD-10-CM | POA: Insufficient documentation

## 2017-08-10 DIAGNOSIS — M5126 Other intervertebral disc displacement, lumbar region: Secondary | ICD-10-CM | POA: Insufficient documentation

## 2017-08-10 DIAGNOSIS — Z7901 Long term (current) use of anticoagulants: Secondary | ICD-10-CM | POA: Insufficient documentation

## 2017-08-10 HISTORY — DX: Essential (primary) hypertension: I10

## 2017-08-10 HISTORY — DX: Personal history of urinary calculi: Z87.442

## 2017-08-10 LAB — SURGICAL PCR SCREEN
MRSA, PCR: NEGATIVE
Staphylococcus aureus: NEGATIVE

## 2017-08-10 LAB — BASIC METABOLIC PANEL
ANION GAP: 7 (ref 5–15)
BUN: 9 mg/dL (ref 6–20)
CO2: 27 mmol/L (ref 22–32)
Calcium: 9.1 mg/dL (ref 8.9–10.3)
Chloride: 104 mmol/L (ref 101–111)
Creatinine, Ser: 0.88 mg/dL (ref 0.61–1.24)
GFR calc Af Amer: >60 mL/min (ref >60)
GLUCOSE: 107 mg/dL — AB (ref 65–99)
POTASSIUM: 4 mmol/L (ref 3.5–5.1)
Sodium: 138 mmol/L (ref 135–145)

## 2017-08-10 LAB — CBC
HEMATOCRIT: 47.6 % (ref 39.0–52.0)
HEMOGLOBIN: 16.2 g/dL (ref 13.0–17.0)
MCH: 31.6 pg (ref 26.0–34.0)
MCHC: 34 g/dL (ref 30.0–36.0)
MCV: 92.8 fL (ref 78.0–100.0)
Platelets: 186 10*3/uL (ref 150–400)
RBC: 5.13 MIL/uL (ref 4.22–5.81)
RDW: 14.2 % (ref 11.5–15.5)
WBC: 7.6 10*3/uL (ref 4.0–10.5)

## 2017-08-10 LAB — PROTIME-INR
INR: 0.96
Prothrombin Time: 12.7 seconds (ref 11.4–15.2)

## 2017-08-10 NOTE — Progress Notes (Signed)
Dr Reather Littler office called and informed that XXl ted hose have to be special ordered Do they want [place] to order them? Materials management says it will take probably take 3 days to get this size.

## 2017-08-10 NOTE — H&P (Signed)
Garrett Munoz is an 72 y.o. male.   Chief Complaint: back and leg pain HPI: Location: right  Quality: stabbing; sharp  Severity: pain level 8/10  Duration: The patient is years out from when symptoms began.  Timing: chronic  Context: No known injury  Aggravating Factors: sitting; lying down  Previous Surgery: none  Prior Imaging: MRI  Previous Injections: helped temporarily (a few days)  Previous PT: did not help  Work Related: no Notes: The patient was previously in pain management with Garrett Munoz, but is no longer seeing him I reviewed his outside medical records from another orthopedic facility.  Past Medical History:  Diagnosis Date  . Atrial fibrillation ([place])   . Bronchitis    hx  . Cataract    right eye  . DDD (degenerative disc disease), lumbosacral   . Dysrhythmia    irregular heatbeat  . Eye worm    right eye  . Gout   . History of kidney stones   . Hypertension   . Left knee DJD   . Morbid obesity with BMI of 40.0-44.9, adult ([place])   . Pneumonia    hx  . Prediabetes   . Radicular syndrome of left leg   . S/P total knee replacement    right  . Sleep apnea    can't use sleep study 10 yrs ago  . 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     Past Surgical History:  Procedure Laterality Date  . CARDIOVERSION N/A 03/04/2015   Procedure: CARDIOVERSION;  Surgeon: Garrett Sine, MD;  Location: [facility];  Service: Cardiovascular;  Laterality: N/A;  . CHOLECYSTECTOMY    . COLONOSCOPY N/A 01/01/2016   Procedure: COLONOSCOPY;  Surgeon: Garrett Houston, MD;  [facility];  Service: Endoscopy;  Laterality: N/A;  1:45  . COLONOSCOPY N/A 01/14/2017   Procedure: COLONOSCOPY;  Surgeon: Garrett Houston, MD;  [facility];  Service: Endoscopy;  Laterality: N/A;  12:05  . EYE EXAMINATION UNDER ANESTHESIA W/ RETINAL CRYOTHERAPY AND RETINAL LASER  07/2011  . EYE SURGERY  02/2011   cat rt  . KNEE ARTHROSCOPY     bilateral  . POLYPECTOMY  01/01/2016   Procedure: POLYPECTOMY;  Surgeon: Garrett Houston, MD;  [facility];  Service: Endoscopy;;  colon  . RETINAL DETACHMENT SURGERY  03/2011   rt  . TOTAL KNEE ARTHROPLASTY  2003   rt  . TOTAL KNEE ARTHROPLASTY  02/07/2012   Procedure: TOTAL KNEE ARTHROPLASTY;  Surgeon: Garrett Junes, MD;  Location: [facility];  Service: Orthopedics;  Laterality: Left;    Family History  Problem Relation Age of Onset  . Lung cancer Father   . Lung cancer Sister    Social History:  reports that he quit smoking about 25 years ago. His smoking use included cigarettes. He has a 30.00 pack-year smoking history. He has never used smokeless tobacco. He reports that he drinks alcohol. He reports that he does not use drugs. 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33 Chewing tobacco: none Alcohol intake: Occasional Hand Dominance: Right Work related injury?: N Obese: Y Overweight: Y Advance directive: Y Medical Power of Attorney: Y  Allergies: No Known Allergies  Medications allopurinol 300 mg tablet carvedilol 12.5 mg tablet clotrimazole 10 mg troche Eliquis 5 mg tablet Percocet 10 mg-325 mg tablet temazepam 30 mg capsule  (Not in a hospital admission)  Results for orders placed or performed during the hospital encounter of 08/10/17 (from  the past 48 hour(s))  Basic metabolic panel     Status: Abnormal   Collection Time: 08/10/17 10:57 AM  Result Value Ref Range   Sodium 138 135 - 145 mmol/L   Potassium 4.0 3.5 - 5.1 mmol/L   Chloride 104 101 - 111 mmol/L   CO2 27 22 - 32 mmol/L   Glucose, Bld 107 (H) 65 - 99 mg/dL   BUN 9 6 - 20 mg/dL   Creatinine, Ser 0.88 0.61 - 1.24 mg/dL   Calcium 9.1 8.9 - 10.3 mg/dL   GFR calc non Af Amer >60 >60 mL/min   GFR calc Af Amer >60 >60 mL/min    Comment: (NOTE) The eGFR has been calculated using the CKD EPI equation. This calculation has not been validated in all clinical situations. eGFR's persistently <60 mL/min signify  possible Chronic Kidney Disease.    Anion gap 7 5 - 15    Comment: Performed at [facility] [address], [place] [address]  CBC     Status: None   Collection Time: 08/10/17 10:57 AM  Result Value Ref Range   WBC 7.6 4.0 - 10.5 K/uL   RBC 5.13 4.22 - 5.81 MIL/uL   Hemoglobin 16.2 13.0 - 17.0 g/dL   HCT 47.6 39.0 - 52.0 %   MCV 92.8 78.0 - 100.0 fL   MCH 31.6 26.0 - 34.0 pg   MCHC 34.0 30.0 - 36.0 g/dL   RDW 14.2 11.5 - 15.5 %   Platelets 186 150 - 400 K/uL    Comment: Performed at [facility], [place] [address] [place], [place] [address]  Protime-INR     Status: None   Collection Time: 08/10/17 10:57 AM  Result Value Ref Range   Prothrombin Time 12.7 11.4 - 15.2 seconds   INR 0.96     Comment: Performed at [facility] [address], [place], [place] [address]  Surgical pcr screen     Status: None   Collection Time: 08/10/17 10:57 AM  Result Value Ref Range   MRSA, PCR NEGATIVE NEGATIVE   Staphylococcus aureus NEGATIVE NEGATIVE    Comment: (NOTE) The Xpert SA Assay (FDA approved for NASAL specimens in patients 45 years of age and older), is one component of a comprehensive surveillance program. It is not intended to diagnose infection nor to guide or monitor treatment. Performed at [facility], [place] [address] [place],  [address]    No results found.  Review of Systems  Constitutional: Negative.   HENT: Negative.   Eyes: Negative.   Respiratory: Negative.   Cardiovascular: Negative.   Gastrointestinal: Negative.   Genitourinary: Negative.   Musculoskeletal: Positive for back pain.  Skin: Negative.   Neurological: Positive for sensory change and focal weakness.  Psychiatric/Behavioral: Negative.     There were no vitals taken for this visit. Physical Exam  Constitutional: He is oriented to person, place, and time. He appears well-developed.  HENT:  Head: Normocephalic.  Eyes: Pupils are equal, round, and reactive  to light.  Neck: Normal range of motion.  Cardiovascular: Normal rate.  Respiratory: Effort normal.  GI: Soft.  Musculoskeletal:  Patient is a 72 year old male.  Constitutional: General Appearance: healthy-appearing and distress (mild).  Psychiatric: Mood and Affect: active and alert and normal affect.  Cardiovascular System: Edema Right: none; Dorsalis and posterior tibial pulses 2+. Edema Left: none.  Cervical Spine: Inspection: alignment normal and no muscle atrophy. Bony Palpation: no tenderness of  the spinous process.  Motor Strength: Neck Strength (Intrinsics): extension 5/5. C5 on the Right: abduction deltoid 5/5. C5 on the Left: abduction deltoid 5/5. C6 on the Right: flexion biceps 5/5. C6 on the Left: flexion biceps 5/5. C7 on the Right: extension triceps 5/5 and flexion wrist 5/5. C7 on the Left: extension triceps 5/5 and flexion wrist 5/5. C8 on the Right: flexion fingers 5/5. C8 on the Left: flexion fingers 5/5. T1 on the Right: abduction fingers 5/5. T1 on the Left: abduction fingers 5/5.  Neurological System: Biceps Reflex Right: normal (2). Biceps Reflex Left: normal on the left (2). Brachioradialis Reflex Right: normal (2). Brachioradialis Reflex Left: normal (2). Triceps Reflex Right: normal (2). Triceps Reflex Left: normal (2). Sensation on the Right: normal median nerve distribution and ulnar nerve distribution and C5 normal, C6 normal, C7 normal, C8 normal, T1 normal, and sensation of the distal extremities normal. Sensation on the Left: normal median nerve distribution and ulnar nerve distribution and C5 normal, C6 normal, C7 normal, T1 normal, and distal extremities normal. Special Tests on the Right: Spurling's test negative. Special Tests on the Left: Spurling's test negative. Special Tests: Hoffman Sign negative. No Babinski or Clonus. Knee Reflex Right: normal (2). Knee Reflex Left: normal (2). Ankle Reflex Right: normal (2). Ankle Reflex Left: normal (2). Babinski Reflex  Right: plantar reflex absent. Babinski Reflex Left: plantar reflex absent. Special Tests on the Right: no clonus of the ankle/knee and seated straight leg raising test positive. Special Tests on the Left: no clonus of the ankle/knee and seated straight leg raising test positive.  Skin: Head and Neck: normal. Right Upper Extremity: normal. Left Upper Extremity: normal. Inspection and palpation: no rash.  Gait and Station: Appearance: ambulating with no assistive devices and antalgic gait.  Abdomen: Inspection and Palpation: non-distended and no tenderness.  Lumbar Spine: Inspection: normal alignment. Bony Palpation of the Lumbar Spine: tender at lumbosacral junction.. Bony Palpation of the Right Hip: no tenderness of the greater trochanter and tenderness of the SI joint; Pelvis stable. Bony Palpation of the Left Hip: no tenderness of the greater trochanter and tenderness of the SI joint. Soft Tissue Palpation on the Right: No flank pain with percussion. Active Range of Motion: limited flexion and extention.  Motor Strength: L1 Motor Strength on the Right: hip flexion iliopsoas 5/5. L1 Motor Strength on the Left: hip flexion iliopsoas 5/5. L2-L4 Motor Strength on the Right: knee extension quadriceps 5/5. L2-L4 Motor Strength on the Left: knee extension quadriceps 5/5. L5 Motor Strength on the Right: ankle dorsiflexion tibialis anterior 5/5 and great toe extension extensor hallucis longus 4/5. L5 Motor Strength on the Left: ankle dorsiflexion tibialis anterior 5/5 and great toe extension extensor hallucis longus 5/5. S1 Motor Strength on the Right: plantar flexion gastrocnemius 5/5. S1 Motor Strength on the Left: plantar flexion gastrocnemius 5/5.  Abdomen soft protuberant. No flank pain with percussion.  Neurological: He is alert and oriented to person, place, and time.    3 view x-rays of the lumbar spine demonstrates multilevel disc degeneration. No instability in flexion extension. Hips are  unremarkable.  Patient pain drawing is organic rates his pain as 8/10 on the visual analog scale.  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 Small disc protrusion at L5-S1.  Assessment/Plan Discuss options living with the symptoms epidural steroid injections activity modification. He has had increase in his weight as he has been unable to exercise or walk due to  the persistent pain.  He does have EHL weakness and neurotension signs.  Surgical option would be a bilateral decompression L4-5.  I had an extensive discussion with the patient concerning the pathology relevant anatomy and treatment options. At this point exhausting conservative treatment and in the presence of a neurologic deficit we discussed microlumbar decompression. I discussed the risks and benefits including bleeding, infection, DVT, PE, anesthetic complications, worsening in their symptoms, improvement in their symptoms, C SF leakage, epidural fibrosis, need for future surgeries such as revision discectomy and lumbar fusion. I also indicated that this is an operation to basically decompress the nerve root to allow recovery as opposed to fixing a herniated disc and that the incidence of recurrent chest disc herniation can approach 15%. Also that nerve root recovery is variable and may not recover completely.  I discussed the operative course including overnight in the hospital. Immediate ambulation. Follow-up in 2 weeks for suture removal. 6 weeks until healing of the herniation followed by 6 weeks of reconditioning and strengthening of the core musculature. Also discussed the need to employ the concepts of disc pressure management and core motion following the surgery to minimize the risk of recurrent disc herniation. We will obtain preoperative clearance i if necessary and proceed accordingly.  He would have to be cleared from a medical standpoint be off his  Eliquis for at least 2 days before and for 5 days afterwards. He would like to proceed due to the increasing pain and the intolerance of his current condition. I indicated that would not address back pain appreciate the kind referral.  Does have an elevated BMI.  Plan microlumbar decompression L4-5 bilateral  Cecilie Kicks., PA-C for Dr. [name] 08/10/2017, 3:14 PM

## 2017-08-10 NOTE — Progress Notes (Signed)
Dr Reather Littler office returned call and states to go ahead and order 2xl ted hose. Call placed to [employer] and they said to call purchasing. Purchasing called and states they need an item number. [employer] called back and item number [phone] called back to purchasing. Purchasing states they will have to call me back.

## 2017-08-10 NOTE — Progress Notes (Signed)
Purchasing called back states they will have the Ted hose delivered

## 2017-08-10 NOTE — H&P (View-Only) (Signed)
Garrett Munoz is an 72 y.o. male.   Chief Complaint: back and leg pain HPI: Location: right  Quality: stabbing; sharp  Severity: pain level 8/10  Duration: The patient is years out from when symptoms began.  Timing: chronic  Context: No known injury  Aggravating Factors: sitting; lying down  Previous Surgery: none  Prior Imaging: MRI  Previous Injections: helped temporarily (a few days)  Previous PT: did not help  Work Related: no Notes: The patient was previously in pain management with Dr. Hardin Negus, but is no longer seeing him I reviewed his outside medical records from another orthopedic facility.  Past Medical History:  Diagnosis Date  . Atrial fibrillation ([place])   . Bronchitis    hx  . Cataract    right eye  . DDD (degenerative disc disease), lumbosacral   . Dysrhythmia    irregular heatbeat  . Eye worm    right eye  . Gout   . History of kidney stones   . Hypertension   . Left knee DJD   . Morbid obesity with BMI of 40.0-44.9, adult ([place])   . Pneumonia    hx  . Prediabetes   . Radicular syndrome of left leg   . S/P total knee replacement    right  . Sleep apnea    can't use sleep study 10 yrs ago  . 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     Past Surgical History:  Procedure Laterality Date  . CARDIOVERSION N/A 03/04/2015   Procedure: CARDIOVERSION;  Surgeon: [name], MD;  Location: [place];  Service: Cardiovascular;  Laterality: N/A;  . CHOLECYSTECTOMY    . COLONOSCOPY N/A 01/01/2016   Procedure: COLONOSCOPY;  Surgeon: [name], MD;  [facility];  Service: Endoscopy;  Laterality: N/A;  1:45  . COLONOSCOPY N/A 01/14/2017   Procedure: COLONOSCOPY;  Surgeon: [name], MD;  [facility];  Service: Endoscopy;  Laterality: N/A;  12:05  . EYE EXAMINATION UNDER ANESTHESIA W/ RETINAL CRYOTHERAPY AND RETINAL LASER  07/2011  . EYE SURGERY  02/2011   cat rt  . KNEE ARTHROSCOPY     bilateral  . POLYPECTOMY  01/01/2016   Procedure: POLYPECTOMY;  Surgeon: [name], MD;  [facility];  Service: Endoscopy;;  colon  . RETINAL DETACHMENT SURGERY  03/2011   rt  . TOTAL KNEE ARTHROPLASTY  2003   rt  . TOTAL KNEE ARTHROPLASTY  02/07/2012   Procedure: TOTAL KNEE ARTHROPLASTY;  Surgeon: [name], MD;  Location: [place];  Service: Orthopedics;  Laterality: Left;    Family History  Problem Relation Age of Onset  . Lung cancer Father   . Lung cancer Sister    Social History:  reports that he quit smoking about 25 years ago. His smoking use included cigarettes. He has a 30.00 pack-year smoking history. He has never used smokeless tobacco. He reports that he drinks alcohol. He reports that he does not use drugs. 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33 Chewing tobacco: none Alcohol intake: Occasional Hand Dominance: Right Work related injury?: N Obese: Y Overweight: Y Advance directive: Y Medical Power of Attorney: Y  Allergies: No Known Allergies  Medications allopurinol 300 mg tablet carvedilol 12.5 mg tablet clotrimazole 10 mg troche Eliquis 5 mg tablet Percocet 10 mg-325 mg tablet temazepam 30 mg capsule  (Not in a hospital admission)  Results for orders placed or performed during the hospital encounter of 08/10/17 (from  the past 48 hour(s))  Basic metabolic panel     Status: Abnormal   Collection Time: 08/10/17 10:57 AM  Result Value Ref Range   Sodium 138 135 - 145 mmol/L   Potassium 4.0 3.5 - 5.1 mmol/L   Chloride 104 101 - 111 mmol/L   CO2 27 22 - 32 mmol/L   Glucose, Bld 107 (H) 65 - 99 mg/dL   BUN 9 6 - 20 mg/dL   Creatinine, Ser 0.88 0.61 - 1.24 mg/dL   Calcium 9.1 8.9 - 10.3 mg/dL   GFR calc non Af Amer >60 >60 mL/min   GFR calc Af Amer >60 >60 mL/min    Comment: (NOTE) The eGFR has been calculated using the CKD EPI equation. This calculation has not been validated in all clinical situations. eGFR's persistently <60 mL/min signify  possible Chronic Kidney Disease.    Anion gap 7 5 - 15    Comment: Performed at [facility] [address], [place] [address]  CBC     Status: None   Collection Time: 08/10/17 10:57 AM  Result Value Ref Range   WBC 7.6 4.0 - 10.5 K/uL   RBC 5.13 4.22 - 5.81 MIL/uL   Hemoglobin 16.2 13.0 - 17.0 g/dL   HCT 47.6 39.0 - 52.0 %   MCV 92.8 78.0 - 100.0 fL   MCH 31.6 26.0 - 34.0 pg   MCHC 34.0 30.0 - 36.0 g/dL   RDW 14.2 11.5 - 15.5 %   Platelets 186 150 - 400 K/uL    Comment: Performed at [facility], [place] [address], [place], [place] [address]  Protime-INR     Status: None   Collection Time: 08/10/17 10:57 AM  Result Value Ref Range   Prothrombin Time 12.7 11.4 - 15.2 seconds   INR 0.96     Comment: Performed at [facility] [address] [place], [place] [address]  Surgical pcr screen     Status: None   Collection Time: 08/10/17 10:57 AM  Result Value Ref Range   MRSA, PCR NEGATIVE NEGATIVE   Staphylococcus aureus NEGATIVE NEGATIVE    Comment: (NOTE) The Xpert SA Assay (FDA approved for NASAL specimens in patients 52 years of age and older), is one component of a comprehensive surveillance program. It is not intended to diagnose infection nor to guide or monitor treatment. Performed at [facility], [place] [address] [place], [place] [address]    No results found.  Review of Systems  Constitutional: Negative.   HENT: Negative.   Eyes: Negative.   Respiratory: Negative.   Cardiovascular: Negative.   Gastrointestinal: Negative.   Genitourinary: Negative.   Musculoskeletal: Positive for back pain.  Skin: Negative.   Neurological: Positive for sensory change and focal weakness.  Psychiatric/Behavioral: Negative.     There were no vitals taken for this visit. Physical Exam  Constitutional: He is oriented to person, place, and time. He appears well-developed.  HENT:  Head: Normocephalic.  Eyes: Pupils are equal, round, and reactive  to light.  Neck: Normal range of motion.  Cardiovascular: Normal rate.  Respiratory: Effort normal.  GI: Soft.  Musculoskeletal:  Patient is a 72 year old male.  Constitutional: General Appearance: healthy-appearing and distress (mild).  Psychiatric: Mood and Affect: active and alert and normal affect.  Cardiovascular System: Edema Right: none; Dorsalis and posterior tibial pulses 2+. Edema Left: none.  Cervical Spine: Inspection: alignment normal and no muscle atrophy. Bony Palpation: no tenderness of  the spinous process.  Motor Strength: Neck Strength (Intrinsics): extension 5/5. C5 on the Right: abduction deltoid 5/5. C5 on the Left: abduction deltoid 5/5. C6 on the Right: flexion biceps 5/5. C6 on the Left: flexion biceps 5/5. C7 on the Right: extension triceps 5/5 and flexion wrist 5/5. C7 on the Left: extension triceps 5/5 and flexion wrist 5/5. C8 on the Right: flexion fingers 5/5. C8 on the Left: flexion fingers 5/5. T1 on the Right: abduction fingers 5/5. T1 on the Left: abduction fingers 5/5.  Neurological System: Biceps Reflex Right: normal (2). Biceps Reflex Left: normal on the left (2). Brachioradialis Reflex Right: normal (2). Brachioradialis Reflex Left: normal (2). Triceps Reflex Right: normal (2). Triceps Reflex Left: normal (2). Sensation on the Right: normal median nerve distribution and ulnar nerve distribution and C5 normal, C6 normal, C7 normal, C8 normal, T1 normal, and sensation of the distal extremities normal. Sensation on the Left: normal median nerve distribution and ulnar nerve distribution and C5 normal, C6 normal, C7 normal, T1 normal, and distal extremities normal. Special Tests on the Right: Spurling's test negative. Special Tests on the Left: Spurling's test negative. Special Tests: Hoffman Sign negative. No Babinski or Clonus. Knee Reflex Right: normal (2). Knee Reflex Left: normal (2). Ankle Reflex Right: normal (2). Ankle Reflex Left: normal (2). Babinski Reflex  Right: plantar reflex absent. Babinski Reflex Left: plantar reflex absent. Special Tests on the Right: no clonus of the ankle/knee and seated straight leg raising test positive. Special Tests on the Left: no clonus of the ankle/knee and seated straight leg raising test positive.  Skin: Head and Neck: normal. Right Upper Extremity: normal. Left Upper Extremity: normal. Inspection and palpation: no rash.  Gait and Station: Appearance: ambulating with no assistive devices and antalgic gait.  Abdomen: Inspection and Palpation: non-distended and no tenderness.  Lumbar Spine: Inspection: normal alignment. Bony Palpation of the Lumbar Spine: tender at lumbosacral junction.. Bony Palpation of the Right Hip: no tenderness of the greater trochanter and tenderness of the SI joint; Pelvis stable. Bony Palpation of the Left Hip: no tenderness of the greater trochanter and tenderness of the SI joint. Soft Tissue Palpation on the Right: No flank pain with percussion. Active Range of Motion: limited flexion and extention.  Motor Strength: L1 Motor Strength on the Right: hip flexion iliopsoas 5/5. L1 Motor Strength on the Left: hip flexion iliopsoas 5/5. L2-L4 Motor Strength on the Right: knee extension quadriceps 5/5. L2-L4 Motor Strength on the Left: knee extension quadriceps 5/5. L5 Motor Strength on the Right: ankle dorsiflexion tibialis anterior 5/5 and great toe extension extensor hallucis longus 4/5. L5 Motor Strength on the Left: ankle dorsiflexion tibialis anterior 5/5 and great toe extension extensor hallucis longus 5/5. S1 Motor Strength on the Right: plantar flexion gastrocnemius 5/5. S1 Motor Strength on the Left: plantar flexion gastrocnemius 5/5.  Abdomen soft protuberant. No flank pain with percussion.  Neurological: He is alert and oriented to person, place, and time.    3 view x-rays of the lumbar spine demonstrates multilevel disc degeneration. No instability in flexion extension. Hips are  unremarkable.  Patient pain drawing is organic rates his pain as 8/10 on the visual analog scale.  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 Small disc protrusion at L5-S1.  Assessment/Plan Discuss options living with the symptoms epidural steroid injections activity modification. He has had increase in his weight as he has been unable to exercise or walk due to  the persistent pain.  He does have EHL weakness and neurotension signs.  Surgical option would be a bilateral decompression L4-5.  I had an extensive discussion with the patient concerning the pathology relevant anatomy and treatment options. At this point exhausting conservative treatment and in the presence of a neurologic deficit we discussed microlumbar decompression. I discussed the risks and benefits including bleeding, infection, DVT, PE, anesthetic complications, worsening in their symptoms, improvement in their symptoms, C SF leakage, epidural fibrosis, need for future surgeries such as revision discectomy and lumbar fusion. I also indicated that this is an operation to basically decompress the nerve root to allow recovery as opposed to fixing a herniated disc and that the incidence of recurrent chest disc herniation can approach 15%. Also that nerve root recovery is variable and may not recover completely.  I discussed the operative course including overnight in the hospital. Immediate ambulation. Follow-up in 2 weeks for suture removal. 6 weeks until healing of the herniation followed by 6 weeks of reconditioning and strengthening of the core musculature. Also discussed the need to employ the concepts of disc pressure management and core motion following the surgery to minimize the risk of recurrent disc herniation. We will obtain preoperative clearance i if necessary and proceed accordingly.  He would have to be cleared from a medical standpoint be off his  Eliquis for at least 2 days before and for 5 days afterwards. He would like to proceed due to the increasing pain and the intolerance of his current condition. I indicated that would not address back pain appreciate the kind referral.  Does have an elevated BMI.  Plan microlumbar decompression L4-5 bilateral  Cecilie Kicks., PA-C for Dr. [name] 08/10/2017, 3:14 PM

## 2017-08-10 NOTE — Progress Notes (Signed)
You are sending this to wrong doctor

## 2017-08-10 NOTE — Progress Notes (Signed)
Anesthesia Chart Review:   Case:  [id number] Date/Time:  08/18/17 0715   Procedure:  Bilateral microlumbar decompression L4-L5 (N/A )   Anesthesia type:  General   Pre-op diagnosis:  Stenosis, herniated nucleus pulposus L4-L5   Location:  [facility] OR ROOM 10 / [place] OR   Surgeon:  [name], MD      DISCUSSION: Pt is a 72 year old male with atrial fibrillation. Pt to hold eliquis 3 days before surgery. Pt has cardiac clearance for surgery.    VS: BP (!) 141/92   Pulse 96   Temp 36.5 C   Resp 20   Ht 5\' 10"  (1.778 m)   Wt (!) 304 lb 11.2 oz (138.2 kg)   SpO2 96%   BMI 43.72 kg/m   PROVIDERS: [name], MD   Patient Care Team: [name], MD as Consulting Physician (Gastroenterology) Cardiologist is Shelva Majestic, MD. Last office visit 01/25/17; 1 year f/u recommended. Cleared for surgery 07/25/17 by Kerin Ransom, PA   LABS: Labs reviewed: Acceptable for surgery. (all labs ordered are listed, but only abnormal results are displayed)  Labs Reviewed  BASIC METABOLIC PANEL - Abnormal; Notable for the following components:      Result Value   Glucose, Bld 107 (*)    All other components within normal limits  SURGICAL PCR SCREEN  CBC  PROTIME-INR    EKG 01/25/17: atrial fibrillation (79 bpm)   CV:  Echo 02/18/15:  - Left ventricle: The cavity size was normal. Wall thickness was increased in a pattern of moderate LVH. Systolic function was normal. The estimated ejection fraction was in the range of 55% to 60%. Wall motion was normal; there were no regional wall motion abnormalities. - Left atrium: The atrium was moderately dilated. - Impressions: NO subcostal windows.  Nuclear stress test 02/12/15:   There was no ST segment deviation noted during stress.  No T wave inversion was noted during stress.  This is a low risk study. - Small, mild fixed apical perfusion defect - can be artifact or scar, the study was not gated due to a-fib. No reversible ischemia. This  is a low risk study, from a myocardial perfusion standpoint.    Past Medical History:  Diagnosis Date  . Atrial fibrillation ([place])   . Bronchitis    hx  . Cataract    right eye  . DDD (degenerative disc disease), lumbosacral   . Dysrhythmia    irregular heatbeat  . Eye worm    right eye  . Gout   . History of kidney stones   . Hypertension   . Left knee DJD   . Morbid obesity with BMI of 40.0-44.9, adult ([place])   . Pneumonia    hx  . Prediabetes   . Radicular syndrome of left leg   . S/P total knee replacement    right  . Sleep apnea    can't use sleep study 10 yrs ago  . 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     Past Surgical History:  Procedure Laterality Date  . CARDIOVERSION N/A 03/04/2015   Procedure: CARDIOVERSION;  Surgeon: [name], MD;  Location: [place];  Service: Cardiovascular;  Laterality: N/A;  . CHOLECYSTECTOMY    . COLONOSCOPY N/A 01/01/2016   Procedure: COLONOSCOPY;  Surgeon: [name], MD;  [facility];  Service: Endoscopy;  Laterality: N/A;  1:45  . COLONOSCOPY N/A 01/14/2017   Procedure: COLONOSCOPY;  Surgeon: [name], MD;  [facility];  Service: Endoscopy;  Laterality: N/A;  12:05  . EYE EXAMINATION UNDER ANESTHESIA W/ RETINAL CRYOTHERAPY AND RETINAL LASER  07/2011  . EYE SURGERY  02/2011   cat rt  . KNEE ARTHROSCOPY     bilateral  . POLYPECTOMY  01/01/2016   Procedure: POLYPECTOMY;  Surgeon: [name], MD;  [facility];  Service: Endoscopy;;  colon  . RETINAL DETACHMENT SURGERY  03/2011   rt  . TOTAL KNEE ARTHROPLASTY  2003   rt  . TOTAL KNEE ARTHROPLASTY  02/07/2012   Procedure: TOTAL KNEE ARTHROPLASTY;  Surgeon: [name], MD;  Location: [place];  Service: Orthopedics;  Laterality: Left;    MEDICATIONS: . acetaminophen (TYLENOL) 325 MG tablet  . allopurinol (ZYLOPRIM) 300 MG tablet  . apixaban (ELIQUIS) 5 MG TABS tablet  . carvedilol (COREG) 12.5 MG tablet  . clotrimazole  (MYCELEX) 10 MG troche  . fish oil-omega-3 fatty acids 1000 MG capsule  . gabapentin (NEURONTIN) 300 MG capsule  . magnesium oxide (MAG-OX) 400 MG tablet  . nortriptyline (PAMELOR) 50 MG capsule  . oxyCODONE-acetaminophen (PERCOCET) 10-325 MG tablet  . temazepam (RESTORIL) 30 MG capsule   No current facility-administered medications for this encounter.    - Pt to hold eliquis 3 days before surgery   If no changes, I anticipate pt can proceed with surgery as scheduled.   Willeen Cass, FNP-BC [facility]/Anesthesiology Phone: [phone] 08/10/2017 3:42 PM

## 2017-08-10 NOTE — Progress Notes (Addendum)
PCP is Dr Jenna Luo Cardiologsit is Dr Claiborne Bill[place]- clearance noted 07-25-17  Denies chest pain, cough, or fever. States he was tested for sleep apnea, but could not wear the cpap States he was told to stop eliquis 3 days prior to surgery. Request sent to materials management for XXl knee high teds  Echo 02-18-15 Stress test 04-14-15

## 2017-08-11 ENCOUNTER — Ambulatory Visit: Payer: PPO | Admitting: Family Medicine

## 2017-08-17 MED ORDER — DEXTROSE 5 % IV SOLN
3.0000 g | INTRAVENOUS | Status: AC
  Administered 2017-08-18: 3 g via INTRAVENOUS
  Filled 2017-08-17: qty 3

## 2017-08-17 NOTE — Anesthesia Preprocedure Evaluation (Addendum)
Anesthesia Evaluation  Patient identified by MRN, date of birth, ID band Patient awake    Reviewed: Allergy & Precautions, NPO status , Patient's Chart, lab work & pertinent test results, reviewed documented beta blocker date and time   Airway Mallampati: III  TM Distance: >3 FB Neck ROM: Full    Dental no notable dental hx. (+) Upper Dentures, Dental Advisory Given   Pulmonary sleep apnea , pneumonia, resolved, former smoker,    breath sounds clear to auscultation       Cardiovascular hypertension, Pt. on medications and Pt. on home beta blockers + dysrhythmias Atrial Fibrillation  Rhythm:Irregular Rate:Normal     Neuro/Psych  Neuromuscular disease negative psych ROS   GI/Hepatic negative GI ROS, Neg liver ROS,   Endo/Other  Morbid obesityGout  Renal/GU Hx/o renal calculi  negative genitourinary   Musculoskeletal  (+) Arthritis , Osteoarthritis,  HNP L4-L5 Lumbar spinal stenosis Left left radiculopathy   Abdominal (+) + obese,   Peds  Hematology Eliquis- last dose    Anesthesia Other Findings   Reproductive/Obstetrics                           Anesthesia Physical Anesthesia Plan  ASA: III  Anesthesia Plan: General   Post-op Pain Management:    Induction: Intravenous  PONV Risk Score and Plan: 3 and Ondansetron, Dexamethasone and Treatment may vary due to age or medical condition  Airway Management Planned: Oral ETT  Additional Equipment:   Intra-op Plan:   Post-operative Plan: Extubation in OR  Informed Consent: I have reviewed the patients History and Physical, chart, labs and discussed the procedure including the risks, benefits and alternatives for the proposed anesthesia with the patient or authorized representative who has indicated his/her understanding and acceptance.   Dental advisory given  Plan Discussed with: CRNA, Anesthesiologist and Surgeon  Anesthesia Plan  Comments:        Anesthesia Quick Evaluation

## 2017-08-18 ENCOUNTER — Ambulatory Visit (HOSPITAL_COMMUNITY): Payer: PPO | Admitting: Emergency Medicine

## 2017-08-18 ENCOUNTER — Ambulatory Visit (HOSPITAL_COMMUNITY): Payer: PPO

## 2017-08-18 ENCOUNTER — Encounter (HOSPITAL_COMMUNITY)
Admission: RE | Disposition: A | Discharge status: Home / Self Care | Payer: Self-pay | Source: Ambulatory Visit | Attending: Specialist

## 2017-08-18 ENCOUNTER — Encounter (HOSPITAL_COMMUNITY): Payer: Self-pay | Admitting: Urology

## 2017-08-18 ENCOUNTER — Ambulatory Visit (HOSPITAL_COMMUNITY)
Admission: RE | Admit: 2017-08-18 | Discharge: 2017-08-19 | Disposition: A | Discharge status: Home / Self Care | Payer: PPO | Source: Ambulatory Visit | Attending: Specialist | Admitting: Specialist

## 2017-08-18 ENCOUNTER — Ambulatory Visit (HOSPITAL_COMMUNITY): Payer: PPO | Admitting: Anesthesiology

## 2017-08-18 DIAGNOSIS — M48062 Spinal stenosis, lumbar region with neurogenic claudication: Secondary | ICD-10-CM | POA: Diagnosis not present

## 2017-08-18 DIAGNOSIS — Z79899 Other long term (current) drug therapy: Secondary | ICD-10-CM | POA: Insufficient documentation

## 2017-08-18 DIAGNOSIS — I10 Essential (primary) hypertension: Secondary | ICD-10-CM | POA: Diagnosis not present

## 2017-08-18 DIAGNOSIS — I4891 Unspecified atrial fibrillation: Secondary | ICD-10-CM | POA: Diagnosis not present

## 2017-08-18 DIAGNOSIS — R7303 Prediabetes: Secondary | ICD-10-CM | POA: Insufficient documentation

## 2017-08-18 DIAGNOSIS — Z96653 Presence of artificial knee joint, bilateral: Secondary | ICD-10-CM | POA: Diagnosis not present

## 2017-08-18 DIAGNOSIS — M5126 Other intervertebral disc displacement, lumbar region: Secondary | ICD-10-CM | POA: Insufficient documentation

## 2017-08-18 DIAGNOSIS — G4730 Sleep apnea, unspecified: Secondary | ICD-10-CM | POA: Diagnosis not present

## 2017-08-18 DIAGNOSIS — Z419 Encounter for procedure for purposes other than remedying health state, unspecified: Secondary | ICD-10-CM

## 2017-08-18 DIAGNOSIS — Z7901 Long term (current) use of anticoagulants: Secondary | ICD-10-CM | POA: Insufficient documentation

## 2017-08-18 DIAGNOSIS — M48061 Spinal stenosis, lumbar region without neurogenic claudication: Secondary | ICD-10-CM | POA: Diagnosis not present

## 2017-08-18 DIAGNOSIS — G4733 Obstructive sleep apnea (adult) (pediatric): Secondary | ICD-10-CM | POA: Diagnosis not present

## 2017-08-18 DIAGNOSIS — Z6841 Body Mass Index (BMI) 40.0 and over, adult: Secondary | ICD-10-CM | POA: Insufficient documentation

## 2017-08-18 DIAGNOSIS — M5137 Other intervertebral disc degeneration, lumbosacral region: Secondary | ICD-10-CM | POA: Insufficient documentation

## 2017-08-18 DIAGNOSIS — Z981 Arthrodesis status: Secondary | ICD-10-CM | POA: Diagnosis not present

## 2017-08-18 HISTORY — PX: LUMBAR LAMINECTOMY/DECOMPRESSION MICRODISCECTOMY: SHX5026

## 2017-08-18 LAB — GLUCOSE, CAPILLARY: Glucose-Capillary: 120 mg/dL — ABNORMAL HIGH (ref 65–99)

## 2017-08-18 LAB — PROTIME-INR
INR: 0.98
Prothrombin Time: 12.9 seconds (ref 11.4–15.2)

## 2017-08-18 SURGERY — LUMBAR LAMINECTOMY/DECOMPRESSION MICRODISCECTOMY 1 LEVEL
Anesthesia: General

## 2017-08-18 MED ORDER — BUPIVACAINE-EPINEPHRINE 0.5% -1:200000 IJ SOLN
INTRAMUSCULAR | Status: DC | PRN
  Administered 2017-08-18: 10 mL

## 2017-08-18 MED ORDER — POLYETHYLENE GLYCOL 3350 17 G PO PACK: 17.0000 g | PACK | Freq: Every day | ORAL | 0 refills | Status: DC

## 2017-08-18 MED ORDER — OXYCODONE HCL 5 MG PO TABS
10.0000 mg | ORAL_TABLET | ORAL | Status: DC | PRN
  Administered 2017-08-18: 10 mg via ORAL
  Filled 2017-08-18: qty 2

## 2017-08-18 MED ORDER — PHENYLEPHRINE 40 MCG/ML (10ML) SYRINGE FOR IV PUSH (FOR BLOOD PRESSURE SUPPORT)
PREFILLED_SYRINGE | INTRAVENOUS | Status: AC
  Filled 2017-08-18: qty 40

## 2017-08-18 MED ORDER — DEXTROSE 5 % IV SOLN
INTRAVENOUS | Status: DC | PRN
  Administered 2017-08-18: 40 ug/min via INTRAVENOUS

## 2017-08-18 MED ORDER — ALUM & MAG HYDROXIDE-SIMETH 200-200-20 MG/5ML PO SUSP
30.0000 mL | Freq: Four times a day (QID) | ORAL | Status: DC | PRN
  Administered 2017-08-18: 30 mL via ORAL
  Filled 2017-08-18: qty 30

## 2017-08-18 MED ORDER — PROPOFOL 10 MG/ML IV BOLUS
INTRAVENOUS | Status: DC | PRN
  Administered 2017-08-18: 160 mg via INTRAVENOUS
  Administered 2017-08-18: 20 mg via INTRAVENOUS

## 2017-08-18 MED ORDER — EPHEDRINE SULFATE 50 MG/ML IJ SOLN
INTRAMUSCULAR | Status: DC | PRN
  Administered 2017-08-18 (×2): 25 mg via INTRAVENOUS

## 2017-08-18 MED ORDER — LACTATED RINGERS IV SOLN
INTRAVENOUS | Status: DC
  Administered 2017-08-18 (×2): via INTRAVENOUS

## 2017-08-18 MED ORDER — ALLOPURINOL 300 MG PO TABS
300.0000 mg | ORAL_TABLET | Freq: Every day | ORAL | Status: DC
  Administered 2017-08-18 – 2017-08-19 (×2): 300 mg via ORAL
  Filled 2017-08-18 (×2): qty 1

## 2017-08-18 MED ORDER — MENTHOL 3 MG MT LOZG: 1.0000 | LOZENGE | OROMUCOSAL | Status: DC | PRN

## 2017-08-18 MED ORDER — SACCHAROMYCES BOULARDII 250 MG PO CAPS
250.0000 mg | ORAL_CAPSULE | Freq: Every day | ORAL | Status: DC
  Administered 2017-08-18 – 2017-08-19 (×2): 250 mg via ORAL
  Filled 2017-08-18 (×2): qty 1

## 2017-08-18 MED ORDER — EPHEDRINE SULFATE 50 MG/ML IJ SOLN
INTRAMUSCULAR | Status: AC
  Filled 2017-08-18: qty 1

## 2017-08-18 MED ORDER — MIDAZOLAM HCL 2 MG/2ML IJ SOLN
INTRAMUSCULAR | Status: AC
  Filled 2017-08-18: qty 2

## 2017-08-18 MED ORDER — ALBUMIN HUMAN 5 % IV SOLN
INTRAVENOUS | Status: DC | PRN
  Administered 2017-08-18 (×2): via INTRAVENOUS

## 2017-08-18 MED ORDER — THROMBIN 20000 UNITS EX SOLR
CUTANEOUS | Status: AC
  Filled 2017-08-18: qty 20000

## 2017-08-18 MED ORDER — METHOCARBAMOL 1000 MG/10ML IJ SOLN
500.0000 mg | Freq: Four times a day (QID) | INTRAMUSCULAR | Status: DC | PRN
  Filled 2017-08-18: qty 5

## 2017-08-18 MED ORDER — CARVEDILOL 3.125 MG PO TABS
ORAL_TABLET | ORAL | Status: AC
  Filled 2017-08-18: qty 2

## 2017-08-18 MED ORDER — THROMBIN (RECOMBINANT) 20000 UNITS EX SOLR
CUTANEOUS | Status: DC | PRN
  Administered 2017-08-18: 08:00:00 via TOPICAL

## 2017-08-18 MED ORDER — DEXAMETHASONE SODIUM PHOSPHATE 10 MG/ML IJ SOLN
INTRAMUSCULAR | Status: DC | PRN
  Administered 2017-08-18: 4 mg via INTRAVENOUS

## 2017-08-18 MED ORDER — CLOTRIMAZOLE 10 MG MT TROC: 10.0000 mg | Freq: Every day | OROMUCOSAL | Status: DC

## 2017-08-18 MED ORDER — SODIUM CHLORIDE 0.9 % IV SOLN
INTRAVENOUS | Status: DC | PRN
  Administered 2017-08-18: 08:00:00

## 2017-08-18 MED ORDER — DOCUSATE SODIUM 100 MG PO CAPS: 100.0000 mg | ORAL_CAPSULE | Freq: Every day | ORAL | 1 refills | Status: DC | PRN

## 2017-08-18 MED ORDER — NORTRIPTYLINE HCL 25 MG PO CAPS
50.0000 mg | ORAL_CAPSULE | Freq: Every day | ORAL | Status: DC
  Administered 2017-08-18: 50 mg via ORAL
  Filled 2017-08-18: qty 2

## 2017-08-18 MED ORDER — CARVEDILOL 6.25 MG PO TABS: 18.7500 mg | ORAL_TABLET | ORAL | Status: DC

## 2017-08-18 MED ORDER — CARVEDILOL 12.5 MG PO TABS
12.5000 mg | ORAL_TABLET | Freq: Two times a day (BID) | ORAL | Status: DC
  Administered 2017-08-19: 12.5 mg via ORAL
  Filled 2017-08-18 (×2): qty 1

## 2017-08-18 MED ORDER — ROCURONIUM BROMIDE 10 MG/ML (PF) SYRINGE
PREFILLED_SYRINGE | INTRAVENOUS | Status: AC
  Filled 2017-08-18: qty 5

## 2017-08-18 MED ORDER — MIDAZOLAM HCL 5 MG/5ML IJ SOLN
INTRAMUSCULAR | Status: DC | PRN
  Administered 2017-08-18 (×2): 1 mg via INTRAVENOUS

## 2017-08-18 MED ORDER — MEPERIDINE HCL 50 MG/ML IJ SOLN: 6.2500 mg | INTRAMUSCULAR | Status: DC | PRN

## 2017-08-18 MED ORDER — ROCURONIUM BROMIDE 100 MG/10ML IV SOLN
INTRAVENOUS | Status: DC | PRN
  Administered 2017-08-18: 60 mg via INTRAVENOUS

## 2017-08-18 MED ORDER — DOCUSATE SODIUM 100 MG PO CAPS
100.0000 mg | ORAL_CAPSULE | Freq: Two times a day (BID) | ORAL | Status: DC
  Administered 2017-08-18 – 2017-08-19 (×2): 100 mg via ORAL
  Filled 2017-08-18 (×2): qty 1

## 2017-08-18 MED ORDER — ONDANSETRON HCL 4 MG PO TABS: 4.0000 mg | ORAL_TABLET | Freq: Four times a day (QID) | ORAL | Status: DC | PRN

## 2017-08-18 MED ORDER — HYDROMORPHONE HCL 1 MG/ML IJ SOLN: 1.0000 mg | INTRAMUSCULAR | Status: DC | PRN

## 2017-08-18 MED ORDER — MAGNESIUM OXIDE 400 (241.3 MG) MG PO TABS
400.0000 mg | ORAL_TABLET | Freq: Every evening | ORAL | Status: DC
  Administered 2017-08-18: 400 mg via ORAL
  Filled 2017-08-18: qty 1

## 2017-08-18 MED ORDER — CARVEDILOL 12.5 MG PO TABS
12.5000 mg | ORAL_TABLET | ORAL | Status: AC
  Administered 2017-08-18: 12.5 mg via ORAL
  Filled 2017-08-18: qty 1

## 2017-08-18 MED ORDER — KCL IN DEXTROSE-NACL 20-5-0.45 MEQ/L-%-% IV SOLN: INTRAVENOUS | Status: AC

## 2017-08-18 MED ORDER — LIDOCAINE HCL (CARDIAC) PF 100 MG/5ML IV SOSY
PREFILLED_SYRINGE | INTRAVENOUS | Status: DC | PRN
  Administered 2017-08-18: 100 mg via INTRAVENOUS

## 2017-08-18 MED ORDER — DEXTROSE 5 % IV SOLN
3.0000 g | Freq: Three times a day (TID) | INTRAVENOUS | Status: AC
  Administered 2017-08-18 (×2): 3 g via INTRAVENOUS
  Filled 2017-08-18: qty 3000
  Filled 2017-08-18: qty 3

## 2017-08-18 MED ORDER — PHENOL 1.4 % MT LIQD: 1.0000 | OROMUCOSAL | Status: DC | PRN

## 2017-08-18 MED ORDER — METHOCARBAMOL 500 MG PO TABS: 500.0000 mg | ORAL_TABLET | Freq: Four times a day (QID) | ORAL | Status: DC | PRN

## 2017-08-18 MED ORDER — OXYCODONE HCL 5 MG PO TABS: 5.0000 mg | ORAL_TABLET | Freq: Four times a day (QID) | ORAL | 0 refills | Status: DC | PRN

## 2017-08-18 MED ORDER — SUGAMMADEX SODIUM 500 MG/5ML IV SOLN
INTRAVENOUS | Status: AC
  Filled 2017-08-18: qty 5

## 2017-08-18 MED ORDER — ONDANSETRON HCL 4 MG/2ML IJ SOLN
INTRAMUSCULAR | Status: DC | PRN
  Administered 2017-08-18: 4 mg via INTRAVENOUS

## 2017-08-18 MED ORDER — 0.9 % SODIUM CHLORIDE (POUR BTL) OPTIME
TOPICAL | Status: DC | PRN
  Administered 2017-08-18: 1000 mL

## 2017-08-18 MED ORDER — DEXAMETHASONE SODIUM PHOSPHATE 10 MG/ML IJ SOLN
INTRAMUSCULAR | Status: AC
  Filled 2017-08-18: qty 1

## 2017-08-18 MED ORDER — ONDANSETRON HCL 4 MG/2ML IJ SOLN
INTRAMUSCULAR | Status: AC
  Filled 2017-08-18: qty 2

## 2017-08-18 MED ORDER — POLYETHYLENE GLYCOL 3350 17 G PO PACK: 17.0000 g | PACK | Freq: Every day | ORAL | Status: DC | PRN

## 2017-08-18 MED ORDER — CARVEDILOL 12.5 MG PO TABS
ORAL_TABLET | ORAL | Status: AC
  Administered 2017-08-18: 12.5 mg via ORAL
  Filled 2017-08-18: qty 1

## 2017-08-18 MED ORDER — ACETAMINOPHEN 500 MG PO TABS
1000.0000 mg | ORAL_TABLET | Freq: Four times a day (QID) | ORAL | Status: AC
  Administered 2017-08-18 – 2017-08-19 (×3): 1000 mg via ORAL
  Filled 2017-08-18 (×3): qty 2

## 2017-08-18 MED ORDER — BUPIVACAINE-EPINEPHRINE (PF) 0.5% -1:200000 IJ SOLN
INTRAMUSCULAR | Status: AC
  Filled 2017-08-18: qty 30

## 2017-08-18 MED ORDER — ONDANSETRON HCL 4 MG/2ML IJ SOLN: 4.0000 mg | Freq: Four times a day (QID) | INTRAMUSCULAR | Status: DC | PRN

## 2017-08-18 MED ORDER — PROPOFOL 10 MG/ML IV BOLUS
INTRAVENOUS | Status: AC
  Filled 2017-08-18: qty 40

## 2017-08-18 MED ORDER — SUCCINYLCHOLINE CHLORIDE 200 MG/10ML IV SOSY
PREFILLED_SYRINGE | INTRAVENOUS | Status: AC
  Filled 2017-08-18: qty 10

## 2017-08-18 MED ORDER — HYDROMORPHONE HCL 2 MG/ML IJ SOLN: 0.3000 mg | INTRAMUSCULAR | Status: DC | PRN

## 2017-08-18 MED ORDER — ACETAMINOPHEN 10 MG/ML IV SOLN
INTRAVENOUS | Status: AC
  Filled 2017-08-18: qty 100

## 2017-08-18 MED ORDER — FENTANYL CITRATE (PF) 100 MCG/2ML IJ SOLN
INTRAMUSCULAR | Status: DC | PRN
  Administered 2017-08-18: 150 ug via INTRAVENOUS

## 2017-08-18 MED ORDER — ACETAMINOPHEN 10 MG/ML IV SOLN
1000.0000 mg | INTRAVENOUS | Status: AC
  Administered 2017-08-18: 1000 mg via INTRAVENOUS

## 2017-08-18 MED ORDER — OXYCODONE HCL 5 MG PO TABS
15.0000 mg | ORAL_TABLET | ORAL | Status: DC | PRN
  Administered 2017-08-18: 15 mg via ORAL
  Filled 2017-08-18: qty 3

## 2017-08-18 MED ORDER — RISAQUAD PO CAPS: 1.0000 | ORAL_CAPSULE | Freq: Every day | ORAL | Status: DC

## 2017-08-18 MED ORDER — PHENYLEPHRINE 40 MCG/ML (10ML) SYRINGE FOR IV PUSH (FOR BLOOD PRESSURE SUPPORT)
PREFILLED_SYRINGE | INTRAVENOUS | Status: AC
  Filled 2017-08-18: qty 10

## 2017-08-18 MED ORDER — PHENYLEPHRINE HCL 10 MG/ML IJ SOLN
INTRAMUSCULAR | Status: DC | PRN
  Administered 2017-08-18 (×3): 80 ug via INTRAVENOUS
  Administered 2017-08-18 (×2): 40 ug via INTRAVENOUS
  Administered 2017-08-18: 80 ug via INTRAVENOUS

## 2017-08-18 MED ORDER — MAGNESIUM CITRATE PO SOLN: 1.0000 | Freq: Once | ORAL | Status: DC | PRN

## 2017-08-18 MED ORDER — PROMETHAZINE HCL 25 MG/ML IJ SOLN: 6.2500 mg | INTRAMUSCULAR | Status: DC | PRN

## 2017-08-18 MED ORDER — CARVEDILOL 12.5 MG PO TABS
12.5000 mg | ORAL_TABLET | Freq: Two times a day (BID) | ORAL | Status: DC
Start: 2017-08-18 — End: 2017-08-18

## 2017-08-18 MED ORDER — ACETAMINOPHEN 325 MG PO TABS: 650.0000 mg | ORAL_TABLET | Freq: Four times a day (QID) | ORAL | Status: DC | PRN

## 2017-08-18 MED ORDER — LIDOCAINE 2% (20 MG/ML) 5 ML SYRINGE
INTRAMUSCULAR | Status: AC
  Filled 2017-08-18: qty 5

## 2017-08-18 MED ORDER — SUGAMMADEX SODIUM 500 MG/5ML IV SOLN
INTRAVENOUS | Status: DC | PRN
  Administered 2017-08-18: 500 mg via INTRAVENOUS

## 2017-08-18 MED ORDER — APIXABAN 5 MG PO TABS: 5.0000 mg | ORAL_TABLET | Freq: Two times a day (BID) | ORAL | 3 refills | Status: DC

## 2017-08-18 MED ORDER — BISACODYL 5 MG PO TBEC: 5.0000 mg | DELAYED_RELEASE_TABLET | Freq: Every day | ORAL | Status: DC | PRN

## 2017-08-18 MED ORDER — FENTANYL CITRATE (PF) 250 MCG/5ML IJ SOLN
INTRAMUSCULAR | Status: AC
  Filled 2017-08-18: qty 5

## 2017-08-18 SURGICAL SUPPLY — 55 items
BAG DECANTER FOR FLEXI CONT (MISCELLANEOUS) ×3 IMPLANT
CLEANER TIP ELECTROSURG 2X2 (MISCELLANEOUS) ×3 IMPLANT
CLOSURE WOUND 1/2 X4 (GAUZE/BANDAGES/DRESSINGS) ×1
CLOTH 2% CHLOROHEXIDINE 3PK (PERSONAL CARE ITEMS) ×3 IMPLANT
CONT SPEC 4OZ CLIKSEAL STRL BL (MISCELLANEOUS) ×3 IMPLANT
DRAPE LAPAROTOMY 100X72X124 (DRAPES) ×3 IMPLANT
DRAPE MICROSCOPE LEICA (MISCELLANEOUS) ×3 IMPLANT
DRAPE SHEET LG 3/4 BI-LAMINATE (DRAPES) ×3 IMPLANT
DRAPE SURG 17X11 SM STRL (DRAPES) ×3 IMPLANT
DRAPE UTILITY XL STRL (DRAPES) ×3 IMPLANT
DRSG AQUACEL AG ADV 3.5X 4 (GAUZE/BANDAGES/DRESSINGS) IMPLANT
DRSG AQUACEL AG ADV 3.5X 6 (GAUZE/BANDAGES/DRESSINGS) ×2 IMPLANT
DRSG TELFA 3X8 NADH (GAUZE/BANDAGES/DRESSINGS) ×3 IMPLANT
DURAPREP 26ML APPLICATOR (WOUND CARE) ×3 IMPLANT
DURASEAL SPINE SEALANT 3ML (MISCELLANEOUS) IMPLANT
ELECT BLADE 4.0 EZ CLEAN MEGAD (MISCELLANEOUS) ×3
ELECT REM PT RETURN 9FT ADLT (ELECTROSURGICAL) ×3
ELECTRODE BLDE 4.0 EZ CLN MEGD (MISCELLANEOUS) IMPLANT
ELECTRODE REM PT RTRN 9FT ADLT (ELECTROSURGICAL) ×1 IMPLANT
GLOVE BIOGEL PI IND STRL 7.0 (GLOVE) ×1 IMPLANT
GLOVE BIOGEL PI INDICATOR 7.0 (GLOVE) ×2
GLOVE INDICATOR 7.0 STRL GRN (GLOVE) ×2 IMPLANT
GLOVE SURG SS PI 7.0 STRL IVOR (GLOVE) ×6 IMPLANT
GLOVE SURG SS PI 7.5 STRL IVOR (GLOVE) ×3 IMPLANT
GLOVE SURG SS PI 8.0 STRL IVOR (GLOVE) ×8 IMPLANT
GOWN STRL REUS W/ TWL LRG LVL3 (GOWN DISPOSABLE) ×1 IMPLANT
GOWN STRL REUS W/ TWL XL LVL3 (GOWN DISPOSABLE) ×1 IMPLANT
GOWN STRL REUS W/TWL LRG LVL3 (GOWN DISPOSABLE) ×3
GOWN STRL REUS W/TWL XL LVL3 (GOWN DISPOSABLE) ×6
IV CATH 14GX2 1/4 (CATHETERS) ×3 IMPLANT
KIT BASIN OR (CUSTOM PROCEDURE TRAY) ×3 IMPLANT
NDL SPNL 18GX3.5 QUINCKE PK (NEEDLE) ×2 IMPLANT
NEEDLE 22X1 1/2 (OR ONLY) (NEEDLE) ×3 IMPLANT
NEEDLE SPNL 18GX3.5 QUINCKE PK (NEEDLE) ×6 IMPLANT
PACK LAMINECTOMY NEURO (CUSTOM PROCEDURE TRAY) ×3 IMPLANT
PAD DRESSING TELFA 3X8 NADH (GAUZE/BANDAGES/DRESSINGS) IMPLANT
PATTIES SURGICAL .75X.75 (GAUZE/BANDAGES/DRESSINGS) IMPLANT
RUBBERBAND STERILE (MISCELLANEOUS) ×6 IMPLANT
SPONGE LAP 4X18 X RAY DECT (DISPOSABLE) IMPLANT
SPONGE SURGIFOAM ABS GEL 100 (HEMOSTASIS) ×3 IMPLANT
STAPLER VISISTAT (STAPLE) IMPLANT
STRIP CLOSURE SKIN 1/2X4 (GAUZE/BANDAGES/DRESSINGS) ×2 IMPLANT
SUT NURALON 4 0 TR CR/8 (SUTURE) IMPLANT
SUT PROLENE 3 0 PS 2 (SUTURE) ×2 IMPLANT
SUT VIC AB 1 CT1 27 (SUTURE) ×9
SUT VIC AB 1 CT1 27XBRD ANBCTR (SUTURE) IMPLANT
SUT VIC AB 1 CT1 27XBRD ANTBC (SUTURE) IMPLANT
SUT VIC AB 1-0 CT2 27 (SUTURE) IMPLANT
SUT VIC AB 2-0 CT1 27 (SUTURE)
SUT VIC AB 2-0 CT1 TAPERPNT 27 (SUTURE) IMPLANT
SUT VIC AB 2-0 CT2 27 (SUTURE) IMPLANT
SYR 3ML LL SCALE MARK (SYRINGE) ×3 IMPLANT
TOWEL GREEN STERILE (TOWEL DISPOSABLE) ×3 IMPLANT
TOWEL GREEN STERILE FF (TOWEL DISPOSABLE) ×3 IMPLANT
YANKAUER SUCT BULB TIP NO VENT (SUCTIONS) ×3 IMPLANT

## 2017-08-18 NOTE — Transfer of Care (Signed)
2Immediate Anesthesia Transfer of Care Note  Patient: Garrett Munoz  Procedure(s) Performed: Bilateral microlumbar decompression Lumbar four-Lumbar five (N/A )  Patient Location: PACU  Anesthesia Type:General  Level of Consciousness: awake, alert , oriented and sedated  Airway & Oxygen Therapy: Patient Spontanous Breathing and Patient connected to face mask oxygen  Post-op Assessment: Report given to RN, Post -op Vital signs reviewed and stable and Patient moving all extremities  Post vital signs: Reviewed and stable  Last Vitals:  Vitals Value Taken Time  BP 146/104 08/18/2017 10:15 AM  Temp    Pulse 99 08/18/2017 10:18 AM  Resp 25 08/18/2017 10:18 AM  SpO2 93 % 08/18/2017 10:18 AM  Vitals shown include unvalidated device data.  Last Pain:  Vitals:   08/18/17 0637  TempSrc: Oral  PainSc:          Complications: No apparent anesthesia complications

## 2017-08-18 NOTE — Interval H&P Note (Signed)
History and Physical Interval Note:  08/18/2017 7:26 AM  Garrett Munoz  has presented today for surgery, with the diagnosis of Stenosis, herniated nucleus pulposus L4-L5  The various methods of treatment have been discussed with the patient and family. After consideration of risks, benefits and other options for treatment, the patient has consented to  Procedure(s): Bilateral microlumbar decompression L4-L5 (N/A) as a surgical intervention .  The patient's history has been reviewed, patient examined, no change in status, stable for surgery.  I have reviewed the patient's chart and labs.  Questions were answered to the patient's satisfaction.     Joey Hudock C

## 2017-08-18 NOTE — Brief Op Note (Signed)
08/18/2017  9:51 AM  PATIENT:  Garrett Munoz  72 y.o. male  PRE-OPERATIVE DIAGNOSIS:  Stenosis, herniated nucleus pulposus Lumbar four-Lumbar five  POST-OPERATIVE DIAGNOSIS:  Stenosis, herniated nucleus pulposus Lumbar four-Lumbar five  PROCEDURE:  Procedure(s): Bilateral microlumbar decompression Lumbar four-Lumbar five (N/A)  SURGEON:  Surgeon(s) and Role:    [name], MD - Primary  PHYSICIAN ASSISTANT:   ASSISTANTS: [place]   ANESTHESIA:   general  EBL:  100 mL   BLOOD ADMINISTERED:none  DRAINS: none   LOCAL MEDICATIONS USED:  MARCAINE     SPECIMEN:  No Specimen  DISPOSITION OF SPECIMEN:  N/A  COUNTS:  YES  TOURNIQUET:  * No tourniquets in log *  DICTATION: .Other Dictation: Dictation Number  [id number]   PLAN OF CARE: Admit for overnight observation  PATIENT DISPOSITION:  PACU - hemodynamically stable.   Delay start of Pharmacological VTE agent (>24hrs) due to surgical blood loss or risk of bleeding: yes

## 2017-08-18 NOTE — Anesthesia Procedure Notes (Signed)
Procedure Name: Intubation Date/Time: 08/18/2017 7:49 AM Performed by: Scheryl Darter, CRNA Pre-anesthesia Checklist: Patient identified, Emergency Drugs available, Suction available and Patient being monitored Patient Re-evaluated:Patient Re-evaluated prior to induction Oxygen Delivery Method: Circle System Utilized Preoxygenation: Pre-oxygenation with 100% oxygen Induction Type: IV induction Ventilation: Mask ventilation without difficulty Laryngoscope Size: Miller and 3 Grade View: Grade I Tube type: Oral Tube size: 8.0 mm Number of attempts: 1 Airway Equipment and Method: Stylet and Oral airway Placement Confirmation: ETT inserted through vocal cords under direct vision,  positive ETCO2 and breath sounds checked- equal and bilateral Secured at: 22 cm Tube secured with: Tape Dental Injury: Teeth and Oropharynx as per pre-operative assessment

## 2017-08-18 NOTE — Discharge Summary (Signed)
Physician Discharge Summary   Patient ID: Garrett Munoz MRN: [id number] DOB/AGE: March 28, 1946 72 y.o.  Admit date: 08/18/2017 Discharge date: 08/19/17  Primary Diagnosis:   Stenosis, herniated nucleus pulposus Lumbar four-Lumbar five  Admission Diagnoses:  Past Medical History:  Diagnosis Date  . Atrial fibrillation ([place])   . Bronchitis    hx  . Cataract    right eye  . DDD (degenerative disc disease), lumbosacral   . Dysrhythmia    irregular heatbeat  . Eye worm    right eye  . Gout   . History of kidney stones   . Hypertension   . Left knee DJD   . Morbid obesity with BMI of 40.0-44.9, adult ([place])   . Pneumonia    hx  . Prediabetes   . Radicular syndrome of left leg   . S/P total knee replacement    right  . Sleep apnea    can't use sleep study 10 yrs ago  . 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    Discharge Diagnoses:   Principal Problem:   Spinal stenosis of lumbar region Active Problems:   Spinal stenosis at L4-L5 level  Procedure:  Procedure(s) (LRB): Bilateral microlumbar decompression Lumbar four-Lumbar five (N/A)   Consults: None  HPI:  see H&P    Laboratory Data: Hospital Outpatient Visit on 08/10/2017  Component Date Value Ref Range Status  . Sodium 08/10/2017 138  135 - 145 mmol/L Final  . Potassium 08/10/2017 4.0  3.5 - 5.1 mmol/L Final  . Chloride 08/10/2017 104  101 - 111 mmol/L Final  . CO2 08/10/2017 27  22 - 32 mmol/L Final  . Glucose, Bld 08/10/2017 107* 65 - 99 mg/dL Final  . BUN 08/10/2017 9  6 - 20 mg/dL Final  . Creatinine, Ser 08/10/2017 0.88  0.61 - 1.24 mg/dL Final  . Calcium 08/10/2017 9.1  8.9 - 10.3 mg/dL Final  . GFR calc non Af Amer 08/10/2017 >60  >60 mL/min Final  . GFR calc Af Amer 08/10/2017 >60  >60 mL/min Final   Comment: (NOTE) The eGFR has been calculated using the CKD EPI equation. This calculation has not been validated in all clinical situations. eGFR's persistently <60 mL/min signify possible Chronic  Kidney Disease.   Georgiann Hahn gap 08/10/2017 7  5 - 15 Final   Performed at [facility], [place] [address] [place], [place] [address]  . WBC 08/10/2017 7.6  4.0 - 10.5 K/uL Final  . RBC 08/10/2017 5.13  4.22 - 5.81 MIL/uL Final  . Hemoglobin 08/10/2017 16.2  13.0 - 17.0 g/dL Final  . HCT 08/10/2017 47.6  39.0 - 52.0 % Final  . MCV 08/10/2017 92.8  78.0 - 100.0 fL Final  . MCH 08/10/2017 31.6  26.0 - 34.0 pg Final  . MCHC 08/10/2017 34.0  30.0 - 36.0 g/dL Final  . RDW 08/10/2017 14.2  11.5 - 15.5 % Final  . Platelets 08/10/2017 186  150 - 400 K/uL Final   Performed at [facility], [place] [address], [place], [place] [address]  . Prothrombin Time 08/10/2017 12.7  11.4 - 15.2 seconds Final  . INR 08/10/2017 0.96   Final   Performed at [facility] [address], [place], [place] [address]  . MRSA, PCR 08/10/2017 NEGATIVE  NEGATIVE Final  . Staphylococcus aureus 08/10/2017 NEGATIVE  NEGATIVE Final   Comment: (NOTE) The Xpert SA Assay (FDA approved for NASAL specimens in patients 71 years of age and older), is  one component of a comprehensive surveillance program. It is not intended to diagnose infection nor to guide or monitor treatment. Performed at [facility], [place] [address], [place], [place] [address]    No results for input(s): HGB in the last 72 hours. No results for input(s): WBC, RBC, HCT, PLT in the last 72 hours. No results for input(s): NA, K, CL, CO2, BUN, CREATININE, GLUCOSE, CALCIUM in the last 72 hours. Recent Labs    08/18/17 0626  INR 0.98    X-Rays:Dg Lumbar Spine 2-3 Views  Result Date: 08/18/2017 CLINICAL DATA:  L4-5 decompression EXAM: LUMBAR SPINE - 2-3 VIEW COMPARISON:  08/10/2017 FINDINGS: First lateral intraoperative image demonstrates posterior needles are directed at the L4 and L5 spinous processes. Second lateral intraoperative image demonstrates posterior instruments directed at L4 and at the L4-5 level. Third lateral intraoperative  image demonstrates posterior surgical instruments at L4-5 level. IMPRESSION: Intraoperative localization as above. Electronically Signed   By: Rolm Baptise M.D.   On: 08/18/2017 09:52   Dg Lumbar Spine 2-3 Views  Result Date: 08/10/2017 CLINICAL DATA:  Preoperative evaluation for upcoming lumbar fusion EXAM: LUMBAR SPINE - 2 VIEW COMPARISON:  01/17/2017 FINDINGS: Five lumbar type vertebral bodies are well visualized. Vertebral body height is stable. Mild osteophytic changes are again noted. No anterolisthesis is seen. Disc space narrowing at L4-5 and L5-S1 is noted. IMPRESSION: Degenerative change without acute abnormality. Electronically Signed   By: Inez Catalina M.D.   On: 08/10/2017 16:10    EKG: Orders placed or performed in visit on 01/25/17  . EKG 12-Lead     Hospital Course: Patient was admitted to [facility] and taken to the OR and underwent the above state procedure without complications.  Patient tolerated the procedure well and was later transferred to the recovery room and then to the orthopaedic floor for postoperative care.  They were given PO and IV analgesics for pain control following their surgery.  They were given 24 hours of postoperative antibiotics.   PT was consulted postop to assist with mobility and transfers.  The patient was allowed to be WBAT with therapy and was taught back precautions. Discharge planning was consulted to help with postop disposition and equipment needs.  Patient had a good night on the evening of surgery and started to get up OOB with therapy on day one. Patient was seen in rounds and was ready to go home on day one.  They were given discharge instructions and dressing directions.  They were instructed on when to follow up in the office with Dr. [name].   Diet: Regular diet Activity:WBAT; Lspine precautions Follow-up:in 10-14 days Disposition - Home Discharged Condition: good    Allergies as of 08/18/2017   No Known Allergies      Medication List    STOP taking these medications   clotrimazole 10 MG troche Commonly known as:  MYCELEX   fish oil-omega-3 fatty acids 1000 MG capsule   gabapentin 300 MG capsule Commonly known as:  NEURONTIN   oxyCODONE-acetaminophen 10-325 MG tablet Commonly known as:  PERCOCET     TAKE these medications   acetaminophen 325 MG tablet Commonly known as:  TYLENOL Take 2 tablets (650 mg total) by mouth every 6 (six) hours as needed (or Fever >/= 101).   allopurinol 300 MG tablet Commonly known as:  ZYLOPRIM Take 1 tablet (300 mg total) by mouth daily.   apixaban 5 MG Tabs tablet Commonly known as:  ELIQUIS Take 1 tablet (  5 mg total) by mouth 2 (two) times daily. Resume 5 days post-op What changed:  additional instructions   carvedilol 12.5 MG tablet Commonly known as:  COREG TAKE 1 & 1/2 TABLETS IN THE MORNING AND 1 TABLET IN THE EVENING What changed:    how much to take  how to take this  when to take this  additional instructions   docusate sodium 100 MG capsule Commonly known as:  COLACE Take 1 capsule (100 mg total) by mouth daily as needed.   magnesium oxide 400 MG tablet Commonly known as:  MAG-OX Take 400 mg by mouth every evening.   nortriptyline 50 MG capsule Commonly known as:  PAMELOR TAKE 1 CAPSULE BY MOUTH AT BEDTIME.   oxyCODONE 5 MG immediate release tablet Commonly known as:  ROXICODONE Take 1-2 tablets (5-10 mg total) by mouth every 6 (six) hours as needed.   polyethylene glycol packet Commonly known as:  MIRALAX Take 17 g by mouth daily.   temazepam 30 MG capsule Commonly known as:  RESTORIL TAKE 1 CAPSULE BY MOUTH AT BEDTIME AS NEEDED FOR SLEEP. What changed:  See the new instructions.      Follow-up Information    [name], MD Follow up in 2 week(s).   Specialty:  Orthopedic Surgery Contact information: [address] [place] [address] [phone]           Signed: Lacie Draft,  PA-C Orthopaedic Surgery 08/18/2017, 5:20 PM

## 2017-08-18 NOTE — Progress Notes (Signed)
Report called to [facility] and she knows to bladder scan pt. Again in 4 hours, urine not to be > 350cc per Dr [name].

## 2017-08-18 NOTE — Progress Notes (Signed)
In & out cath. For 750cc clear yellow uo.  Pt tol procedure well.

## 2017-08-18 NOTE — Progress Notes (Signed)
Dr [name] here to see pt. Bladder scan show 700cc. BRP w/asst-pt unable to void.

## 2017-08-18 NOTE — Anesthesia Postprocedure Evaluation (Signed)
Anesthesia Post Note  Patient: Garrett Munoz  Procedure(s) Performed: Bilateral microlumbar decompression Lumbar four-Lumbar five (N/A )     Patient location during evaluation: PACU Anesthesia Type: General Level of consciousness: awake and alert Pain management: pain level controlled Vital Signs Assessment: post-procedure vital signs reviewed and stable Respiratory status: spontaneous breathing, nonlabored ventilation, respiratory function stable and patient connected to nasal cannula oxygen Cardiovascular status: blood pressure returned to baseline and stable Postop Assessment: no apparent nausea or vomiting Anesthetic complications: no    Last Vitals:  Vitals:   08/18/17 1113 08/18/17 1114  BP:    Pulse: 92 (!) 103  Resp: 18 (!) 22  Temp:    SpO2:  95%    Last Pain:  Vitals:   08/18/17 1114  TempSrc:   PainSc: 0-No pain                 Lalah Durango A.

## 2017-08-19 ENCOUNTER — Encounter (HOSPITAL_COMMUNITY): Payer: Self-pay | Admitting: Specialist

## 2017-08-19 DIAGNOSIS — M48061 Spinal stenosis, lumbar region without neurogenic claudication: Secondary | ICD-10-CM | POA: Diagnosis not present

## 2017-08-19 LAB — BASIC METABOLIC PANEL
Anion gap: 8 (ref 5–15)
BUN: 10 mg/dL (ref 6–20)
CALCIUM: 9 mg/dL (ref 8.9–10.3)
CHLORIDE: 101 mmol/L (ref 101–111)
CO2: 32 mmol/L (ref 22–32)
CREATININE: 1.08 mg/dL (ref 0.61–1.24)
GFR calc non Af Amer: >60 mL/min (ref >60)
Glucose, Bld: 126 mg/dL — ABNORMAL HIGH (ref 65–99)
Potassium: 4.2 mmol/L (ref 3.5–5.1)
SODIUM: 141 mmol/L (ref 135–145)

## 2017-08-19 LAB — CBC
HCT: 44 % (ref 39.0–52.0)
Hemoglobin: 14.6 g/dL (ref 13.0–17.0)
MCH: 31.7 pg (ref 26.0–34.0)
MCHC: 33.2 g/dL (ref 30.0–36.0)
MCV: 95.4 fL (ref 78.0–100.0)
Platelets: 215 10*3/uL (ref 150–400)
RBC: 4.61 MIL/uL (ref 4.22–5.81)
RDW: 14.7 % (ref 11.5–15.5)
WBC: 12.9 10*3/uL — ABNORMAL HIGH (ref 4.0–10.5)

## 2017-08-19 MED FILL — oxyCODONE HCL 5 MG TABS: 5 | 7 days supply | Qty: 40 | Fill #0

## 2017-08-19 MED FILL — Thrombin For Soln 20000 Unit: CUTANEOUS | Qty: 1 | Status: AC

## 2017-08-19 NOTE — Progress Notes (Signed)
Subjective: 1 Day Post-Op Procedure(s) (LRB): Bilateral microlumbar decompression Lumbar four-Lumbar five (N/A) Patient reports pain as mild.  Voiding without difficulty. No c/o. Feels ready to go home today. Seen by Dr. [name] in AM rounds.  Objective: Vital signs in last 24 hours: Temp:  [97 F (36.1 C)-98.1 F (36.7 C)] 97.6 F (36.4 C) (05/03 0351) Pulse Rate:  [91-111] 109 (05/03 0805) Resp:  [17-22] 18 (05/03 0805) BP: (110-162)/(70-107) 162/96 (05/03 0805) SpO2:  [92 %-97 %] 97 % (05/03 0805)  Intake/Output from previous day: 05/02 0701 - 05/03 0700 In: 2300 [P.O.:50; I.V.:1550; IV Piggyback:700] Out: 7680 [Urine:1475; Blood:200] Intake/Output this shift: No intake/output data recorded.  Recent Labs    08/19/17 0625  HGB 14.6   Recent Labs    08/19/17 0625  WBC 12.9*  RBC 4.61  HCT 44.0  PLT 215   Recent Labs    08/19/17 0625  NA 141  K 4.2  CL 101  CO2 32  BUN 10  CREATININE 1.08  GLUCOSE 126*  CALCIUM 9.0   Recent Labs    08/18/17 0626  INR 0.98    Neurologically intact ABD soft Neurovascular intact Sensation intact distally Intact pulses distally Dorsiflexion/Plantar flexion intact Incision: dressing C/D/I and no drainage No cellulitis present Compartment soft no calf pain or sign of DVT  Assessment/Plan: 1 Day Post-Op Procedure(s) (LRB): Bilateral microlumbar decompression Lumbar four-Lumbar five (N/A) Advance diet Up with therapy D/C IV fluids Plan D/C home today Dr. [name] discussed D/C instructions, dressing instructions Follow up in office in 2 weeks for recheck   Garrett Munoz M. 08/19/2017, 8:20 AM

## 2017-08-19 NOTE — Evaluation (Signed)
Occupational Therapy Evaluation and Discharge Patient Details Name: Garrett Munoz MRN: [id number] DOB: 05-20-1945 Today's Date: 08/19/2017    History of Present Illness Pt is a 72 y/o male s/p L4-L5 bilateral microlumbar decompression. PMH including but not limited to a-fib, HTN, R TKA in 2003 and L TKA in 2013.   Clinical Impression   This 72 yo male admitted and underwent above presents to acute OT with all education completed, we will D/C from acute OT.    Follow Up Recommendations  No OT follow up;Supervision - Intermittent    Equipment Recommendations  None recommended by OT       Precautions / Restrictions Precautions Precautions: Back Precaution Comments: PT reviewed 3/3 back precautions with pt and pt's family  Restrictions Weight Bearing Restrictions: No      Mobility Bed Mobility Overal bed mobility: Needs Assistance Bed Mobility: Rolling;Sidelying to Sit Rolling: Supervision Sidelying to sit: Supervision          Transfers Overall transfer level: Needs assistance Equipment used: None Transfers: Sit to/from Stand Sit to Stand: Supervision                  ADL either performed or assessed with clinical judgement   ADL                                         General ADL Comments: Educated pt on sequence of getting dressed, using 2 cups for brushing teeth, using baby wipes for back peri care, amount of time he needs to sit at one time, wife to A prn for any LB ADL issues he has     Vision Patient Visual Report: No change from baseline              Pertinent Vitals/Pain Pain Assessment: No/denies pain     Hand Dominance Right   Extremity/Trunk Assessment Upper Extremity Assessment Upper Extremity Assessment: Overall WFL for tasks assessed           Communication Communication Communication: No difficulties   Cognition Arousal/Alertness: Awake/alert Behavior During Therapy: WFL for tasks  assessed/performed Overall Cognitive Status: Within Functional Limits for tasks assessed                                                Home Living Family/patient expects to be discharged to:: Private residence Living Arrangements: Spouse/significant other Available Help at Discharge: Family;Available 24 hours/day Type of Home: House Home Access: Level entry     Home Layout: Two level Alternate Level Stairs-Number of Steps: 2-3 Alternate Level Stairs-Rails: Right;Left Bathroom Shower/Tub: Occupational psychologist: Handicapped height     Home Equipment: Environmental consultant - 2 wheels;Cane - single point;Other (comment);Bedside commode;Shower seat - built in;Grab bars - tub/shower;Grab bars - toilet;Hand held shower head(lift chair)          Prior Functioning/Environment Level of Independence: Independent                 OT Problem List: Decreased strength;Decreased range of motion;Obesity         OT Goals(Current goals can be found in the care plan section) Acute Rehab OT Goals Patient Stated Goal: return home today  OT Frequency:  AM-PAC PT "6 Clicks" Daily Activity     Outcome Measure Help from another person eating meals?: None Help from another person taking care of personal grooming?: None Help from another person toileting, which includes using toliet, bedpan, or urinal?: A Little Help from another person bathing (including washing, rinsing, drying)?: A Little Help from another person to put on and taking off regular upper body clothing?: None Help from another person to put on and taking off regular lower body clothing?: A Little 6 Click Score: 21   End of Session Nurse Communication: (No further OT needs)  Activity Tolerance: Patient tolerated treatment well Patient left: (sitting EOB)  OT Visit Diagnosis: Other abnormalities of gait and mobility (R26.89)                Time: 2800-3491 OT Time Calculation (min): 21  min Charges:  OT General Charges $OT Visit: 1 Visit [place], OTR/L [phone] 08/19/2017

## 2017-08-19 NOTE — Progress Notes (Signed)
Patient alert and oriented, mae's well, voiding adequate amount of urine, swallowing without difficulty, no c/o pain at time of discharge. Patient discharged home with family. Script and discharged instructions given to patient. Patient and family stated understanding of instructions given. Patient has an appointment with Dr. Beane ?

## 2017-08-19 NOTE — Evaluation (Signed)
Physical Therapy Evaluation & Discharge Patient Details Name: Garrett Munoz MRN: [id number] DOB: [birth year] Today's Date: 08/19/2017   History of Present Illness  Pt is a 72 y/o male s/p L4-L5 bilateral microlumbar decompression. PMH including but not limited to a-fib, HTN, R TKA in 2003 and L TKA in 2013.  Clinical Impression  Pt presented sitting OOB in recliner chair, awake and willing to participate in therapy session. Prior to admission, pt reported that he was independent with all functional mobility and ADLs. Pt lives with his wife who will be able to provide 24/7 supervision/assistance if needed. Pt currently able to perform transfers with supervision, ambulate in hallway with supervision without use of an AD and ascend/descend steps with min guard for safety and use of one hand rail. PT provided back precautions handout and reviewed with pt and his family. PT also discussed a generalized walking program for pt to initiate upon d/c. PT answered all questions at end of session. No further acute PT needs identified at this time. PT signing off.      Follow Up Recommendations No PT follow up;Supervision - Intermittent    Equipment Recommendations  None recommended by PT    Recommendations for Other Services       Precautions / Restrictions Precautions Precautions: Back Precaution Booklet Issued: Yes (comment) Precaution Comments: PT reviewed 3/3 back precautions with pt and pt's family throughout Required Braces or Orthoses: ("no brace") Restrictions Weight Bearing Restrictions: No      Mobility  Bed Mobility               General bed mobility comments: pt sitting OOB in recliner chair upon arrival  Transfers Overall transfer level: Needs assistance Equipment used: None Transfers: Sit to/from Stand Sit to Stand: Supervision         General transfer comment: supervision for safety  Ambulation/Gait Ambulation/Gait assistance: Supervision Ambulation Distance  (Feet): 300 Feet Assistive device: None Gait Pattern/deviations: Step-through pattern;Decreased stride length Gait velocity: decreased Gait velocity interpretation: <1.31 ft/sec, indicative of household ambulator General Gait Details: pt steady without use of an AD or UE supports, supervision for safety  Stairs Stairs: Yes Stairs assistance: Min guard Stair Management: One rail Right;Step to pattern;Forwards Number of Stairs: 3 General stair comments: no issues or concerns, min guard for safety  Wheelchair Mobility    Modified Rankin (Stroke Patients Only)       Balance Overall balance assessment: Needs assistance Sitting-balance support: Feet supported Sitting balance-Leahy Scale: Good     Standing balance support: During functional activity;No upper extremity supported Standing balance-Leahy Scale: Good                               Pertinent Vitals/Pain Pain Assessment: No/denies pain    Home Living Family/patient expects to be discharged to:: Private residence Living Arrangements: Spouse/significant other Available Help at Discharge: Family;Available 24 hours/day Type of Home: House Home Access: Level entry     Home Layout: Two level Home Equipment: Walker - 2 wheels;Cane - single point;Other (comment)(lift chair)      Prior Function Level of Independence: Independent               Hand Dominance        Extremity/Trunk Assessment   Upper Extremity Assessment Upper Extremity Assessment: Defer to OT evaluation    Lower Extremity Assessment Lower Extremity Assessment: Overall WFL for tasks assessed    Cervical / Trunk Assessment  Cervical / Trunk Assessment: Other exceptions Cervical / Trunk Exceptions: s/p lumbar sx  Communication   Communication: No difficulties  Cognition Arousal/Alertness: Awake/alert Behavior During Therapy: WFL for tasks assessed/performed Overall Cognitive Status: Within Functional Limits for tasks  assessed                                        General Comments      Exercises     Assessment/Plan    PT Assessment Patent does not need any further PT services  PT Problem List         PT Treatment Interventions      PT Goals (Current goals can be found in the Care Plan section)  Acute Rehab PT Goals Patient Stated Goal: return home today    Frequency     Barriers to discharge        Co-evaluation               AM-PAC PT "6 Clicks" Daily Activity  Outcome Measure Difficulty turning over in bed (including adjusting bedclothes, sheets and blankets)?: A Little Difficulty moving from lying on back to sitting on the side of the bed? : A Little Difficulty sitting down on and standing up from a chair with arms (e.g., wheelchair, bedside commode, etc,.)?: None Help needed moving to and from a bed to chair (including a wheelchair)?: None Help needed walking in hospital room?: None Help needed climbing 3-5 steps with a railing? : None 6 Click Score: 22    End of Session   Activity Tolerance: Patient tolerated treatment well Patient left: in bed;with call bell/phone within reach;with family/visitor present;Other (comment)(sitting EOB) Nurse Communication: Mobility status PT Visit Diagnosis: Other abnormalities of gait and mobility (R26.89)    Time: 0947-0962 PT Time Calculation (min) (ACUTE ONLY): 14 min   Charges:   PT Evaluation $PT Eval Low Complexity: 1 Low     PT G Codes:        [place], PT, [place] [phone]   [place] 08/19/2017, 9:10 AM

## 2017-08-19 NOTE — Discharge Instructions (Signed)
Walk As Tolerated utilizing back precautions.  No bending, twisting, or lifting.  No driving for 2 weeks.   Aquacel dressing may remain in place until follow up. May shower with aquacel dressing in place. If the dressing peels off or becomes saturated, you may remove aquacel dressing and place gauze and tape dressing which should be kept clean and dry and changed daily. Do not remove steri-strips if they are present. See Dr. [name] in office in 10 to 14 days. Resume eliquis 5 days post-op. Walk daily even outside. Use a cane or walker only if necessary. Avoid sitting on soft sofas.

## 2017-08-25 NOTE — Op Note (Signed)
NAME: Garrett Munoz, GAUT[facility] MEDICAL RECORD XT:[id number] ACCOUNT [id number] DATE OF BIRTH:October 12, 1945 FACILITY: [facility] LOCATION: [facility] PHYSICIAN:Abbie Jablon Windy Kalata, MD  OPERATIVE REPORT  DATE OF PROCEDURE:  08/18/2017  PREOPERATIVE DIAGNOSES: 1. Spinal stenosis, herniated nucleus pulposus at L4-L5. 2. Elevated BMI of 43.  POSTOPERATIVE DIAGNOSES: 1. Spinal stenosis, herniated nucleus pulposus at L4-L5. 2. Elevated BMI of 43.  PROCEDURES PERFORMED: 1. Microlumbar decompression L4-L5, bilaterally. 2. Foraminotomies L4-L5, bilaterally. 3. Microdiskectomy L4-L5, right.  ANESTHESIA:  General.  ASSISTANT:  Lacie Draft, PA  TECHNICAL DIFFICULTY:  Increased due to the patient's elevated BMI of 43.  HISTORY:  71, bilateral radicular pain, right greater than left, EHL weakness, neural tension sign secondary to spinal stenosis and HNP.  Refractory conservative treatment.  Indicated for microlumbar decompression at 4-5.  Risks and benefits were  discussed including bleeding, infection, damage to neurovascular structures, no change in symptoms, worsening symptoms, DVT, PE, anesthetic complications, etc.  TECHNIQUE:  The patient in supine position, after induction of adequate general anesthesia, 3 g  of Kefzol, placed prone on the Andrews frame, all bony prominences well padded, lumbar region was prepped and draped in the usual sterile fashion.  The patient required additional time to place on the [place] frame.  Padding the extremities.  Abdomen free.  Multiple individuals required for lifting.  After prepping and draping, two 18-gauge spinal needle were utilized to localize the 4-5 interspace,  confirmed with x-ray.  Incision was made from the spinous process 4-5.  Subcutaneous tissue was dissected.  Electrocautery achieved hemostasis.  Dorsolumbar fascia infiltrated with 0.25% Marcaine with epinephrine.  Paraspinous muscle elevated from lamina  of 4 and 5.  The extra-long McCullough retractors  were utilized.  Confirmatory radiograph obtained.  The operating microscope was draped and brought into the surgical field confirming L4-5.  The patient had a near absent interlaminar window on the right  at 4-5 and on the left due to facet hypertrophy.  Therefore, we felt prudent to proceed centrally.  I removed the interspinous ligament and a portion of the spinous process of 4.  Then, performed hemilaminotomies of the caudad edge of 4 bilaterally.  A  straight curette was utilized to detach the ligamentum flavum from the cephalad edge of 5 bilaterally.  Following this, neuro patties placed beneath the ligamentum flavum.  Hemilaminotomies of L5 bilaterally were performed and with a Woodson retractor  protecting the 5 roots, foraminotomies of 5 were performed as well.  On the right where the disk herniation was noted, we extended the laminotomy cephalad preserving the pars.  Bilaterally, we decompressed the lateral recess to the medial border of the  pedicle due to the facet and ligamentum flavum hypertrophy and severe lateral recess stenosis.  Following this, we were able to mobilize the thecal sac to the left.  Epidural venous plexus was noted and cauterized.  We obtained a confirmatory radiograph  at the disk space, then able to mobilize the thecal sac medially.  Annulotomy was performed at a disk herniation and copious portions of disk material were removed from the disk space with a micropituitary.  Further mobilized with an Epstein.  Catheter  lavage antibiotic irrigation within the disk space.  Additional fragments retrieved.  No further fragments were noted.  There was 1 cm of excursion of the 5 root medial to the pedicle without tension following this,  No disk herniation residual noted  beneath the thecal sac, axilla or the shoulder of the root.  A neuro probe passed freely at the foramen  of 4 and 5 bilaterally.  Foraminotomies of 4 were performed as there was extension of the facet hypertrophy  bilaterally.  Following the decompression,  there was good restoration of the thecal sac, no CSF leakage.  Active bleeding was cauterized.  Bone wax was placed in all cancellous surfaces.  Thrombin soaked Gelfoam patty was placed in the lateral recess on the right.  I removed the McCullough  self-retaining retractor.  Irrigated the paraspinous musculature.  Meticulously achieved hemostasis with bipolar cautery and bone wax.  No bleeding was noted.  Closed the dorsolumbar fascia with #1 Vicryl interrupted figure-of-eight suture, a small  aperture was left open distally approximately a centimeter to 0.5 cm.  Subcu with multiple 2-0 layers and skin with staples due to the patient's elevated size.  Sterile dressing was applied.  Again, multiple individuals were utilized to transfer the patient back to the hospital bed, extubated without difficulty and transported to the recovery room in satisfactory condition.  The patient tolerated the procedure well.  No complications.  Blood loss 100 mL.     GN/NUANCE  D:08/18/2017 T:08/18/2017 JOB:[id number]

## 2017-08-29 ENCOUNTER — Other Ambulatory Visit: Payer: Self-pay | Admitting: Family Medicine

## 2017-08-29 MED FILL — TEMAZEPAM 30 MG CAPSULE: 30 | 30 days supply | Qty: 30 | Fill #0

## 2017-08-29 MED FILL — NORTRIPTYLINE HCL 50 MG CAP: 50 | 30 days supply | Qty: 30 | Fill #1

## 2017-08-29 NOTE — Telephone Encounter (Signed)
Requesting refill      LOV: 05/27/17  LRF:  08/04/17

## 2017-09-13 MED FILL — AMOXICILLIN 500 MG CAPSULE: 500 | 3 days supply | Qty: 12 | Fill #0

## 2017-09-30 ENCOUNTER — Other Ambulatory Visit: Payer: Self-pay | Admitting: Family Medicine

## 2017-09-30 ENCOUNTER — Ambulatory Visit (HOSPITAL_COMMUNITY): Payer: PPO | Attending: Specialist | Admitting: Physical Therapy

## 2017-09-30 ENCOUNTER — Encounter (HOSPITAL_COMMUNITY): Payer: Self-pay | Admitting: Physical Therapy

## 2017-09-30 ENCOUNTER — Other Ambulatory Visit: Payer: Self-pay

## 2017-09-30 DIAGNOSIS — M6281 Muscle weakness (generalized): Secondary | ICD-10-CM | POA: Diagnosis not present

## 2017-09-30 DIAGNOSIS — R2689 Other abnormalities of gait and mobility: Secondary | ICD-10-CM | POA: Diagnosis not present

## 2017-09-30 DIAGNOSIS — R29898 Other symptoms and signs involving the musculoskeletal system: Secondary | ICD-10-CM | POA: Diagnosis not present

## 2017-09-30 DIAGNOSIS — F5101 Primary insomnia: Secondary | ICD-10-CM

## 2017-09-30 MED FILL — TEMAZEPAM 30 MG CAPSULE: 30 | 30 days supply | Qty: 30 | Fill #1

## 2017-09-30 MED FILL — NORTRIPTYLINE HCL 50 MG CAP: 50 | 30 days supply | Qty: 30 | Fill #0

## 2017-09-30 MED FILL — CARVEDILOL 12.5 MG TABLET: 12.5 | 90 days supply | Qty: 225 | Fill #1

## 2017-09-30 MED FILL — ALLOPURINOL 300 MG TABLET: 300 | 90 days supply | Qty: 90 | Fill #3

## 2017-09-30 NOTE — Therapy (Signed)
[facility] [address] [place], [place], [address] Phone: [phone]   Fax:  [phone]  Physical Therapy Evaluation  Patient Details  Name: Garrett Munoz MRN: [id number] Date of Birth: 1946/04/08 Referring Provider: [name], MD   Encounter Date: 09/30/2017  PT End of Session - 09/30/17 1212    Visit Number  1    Number of Visits  9    Date for PT Re-Evaluation  10/28/17    Authorization Type  [employer] (No auth required and visits based on medical necessity)    Authorization Time Period  09/30/17 - 10/28/17    Authorization - Visit Number  1    Authorization - Number of Visits  10    PT Start Time  1100    PT Stop Time  1517    PT Time Calculation (min)  44 min    Activity Tolerance  Patient tolerated treatment well;No increased pain    Behavior During Therapy  WFL for tasks assessed/performed       Past Medical History:  Diagnosis Date  . Atrial fibrillation ([place])   . Bronchitis    hx  . Cataract    right eye  . DDD (degenerative disc disease), lumbosacral   . Dysrhythmia    irregular heatbeat  . Eye worm    right eye  . Gout   . History of kidney stones   . Hypertension   . Left knee DJD   . Morbid obesity with BMI of 40.0-44.9, adult ([place])   . Pneumonia    hx  . Prediabetes   . Radicular syndrome of left leg   . S/P total knee replacement    right  . Sleep apnea    can't use sleep study 10 yrs ago  . 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     Past Surgical History:  Procedure Laterality Date  . CARDIOVERSION N/A 03/04/2015   Procedure: CARDIOVERSION;  Surgeon: [name], MD;  Location: [facility];  Service: Cardiovascular;  Laterality: N/A;  . CHOLECYSTECTOMY    . COLONOSCOPY N/A 01/01/2016   Procedure: COLONOSCOPY;  Surgeon: [name], MD;  [facility];  Service: Endoscopy;  Laterality: N/A;  1:45  . COLONOSCOPY N/A 01/14/2017   Procedure: COLONOSCOPY;  Surgeon: [name], MD;  [facility];  Service: Endoscopy;  Laterality: N/A;  12:05  . EYE EXAMINATION UNDER ANESTHESIA W/ RETINAL CRYOTHERAPY AND RETINAL LASER  07/2011  . EYE SURGERY  02/2011   cat rt  . KNEE ARTHROSCOPY     bilateral  . LUMBAR LAMINECTOMY/DECOMPRESSION MICRODISCECTOMY N/A 08/18/2017   Procedure: Bilateral microlumbar decompression Lumbar four-Lumbar five;  Surgeon: [name], MD;  Location: [facility];  Service: Orthopedics;  Laterality: N/A;  . POLYPECTOMY  01/01/2016   Procedure: POLYPECTOMY;  Surgeon: [name], MD;  [facility];  Service: Endoscopy;;  colon  . RETINAL DETACHMENT SURGERY  03/2011   rt  . TOTAL KNEE ARTHROPLASTY  2003   rt  . TOTAL KNEE ARTHROPLASTY  02/07/2012   Procedure: TOTAL KNEE ARTHROPLASTY;  Surgeon: [name], MD;  Location: [facility];  Service: Orthopedics;  Laterality: Left;    There were no vitals filed for this visit.   Subjective Assessment - 09/30/17 1121    Subjective  Patient reported that he had a back surgery on Aug 18, 2017. Patient denied any pain currently. Patient denied any tingling and numbness currently. Patient  denied any changes in bowel and bladder function. Patient stated that he would like to be able to go back to gardening. Patient stated that he is still following no bending, lifting, or twisting precautions currently. Patient stated that he would like to do therapy to prevent needing anymore surgeries and make sure he is doing well following the last surgery.     Pertinent History  S/P Microlumbar decompression L4-L5; bilaterally, foraminotomies L4-L5, bilaterally; microdikectomy L4-L5, right    Limitations  Standing;Walking;House hold activities    How long can you sit comfortably?  Not limited    How long can you stand comfortably?  Patient has stood for 5-10 minutes     How long can you walk comfortably?  30 minutes    Diagnostic tests  X-ray preoperatively on 08/10/17: "Degenerative change without  acute abnormality"    Patient Stated Goals  Improve strength and prevent having to have any other surgeries    Currently in Pain?  No/denies         [facility] PT Assessment - 09/30/17 0001      Assessment   Medical Diagnosis  Lumbar s/p lumbar decompression    Referring Provider  [name], MD    Onset Date/Surgical Date  08/18/17    Hand Dominance  Right    Next MD Visit  -- Does not have anymore appointments    Prior Therapy  Yes, following knee surgeries      Precautions   Precautions  Back    Precaution Comments  Patient stated no bending, lifting or twisting      Restrictions   Weight Bearing Restrictions  No      Balance Screen   Has the patient fallen in the past 6 months  No    Has the patient had a decrease in activity level because of a fear of falling?   Yes    Is the patient reluctant to leave their home because of a fear of falling?   No      Home Environment   Living Environment  Private residence    Living Arrangements  Spouse/significant other    Type of [place] to enter    Entrance Stairs-Number of Steps  3    Entrance Stairs-Rails  None    Home Layout  One level    [place] - single point;Walker - 2 wheels      Prior Function   Level of Independence  Independent;Independent with basic ADLs    Vocation  Retired      Charity fundraiser Status  Within Functional Limits for tasks assessed      Observation/Other Assessments   Focus on Therapeutic Outcomes (FOTO)   53% (47% limited)      Sensation   Light Touch  Not tested Patient denied any tingling or numbness      Squat   Comments  Patient performed squat x1 with increased tibial translation and some decreased steadiness      Other:   Other/ Comments  Patient performed half kneel to stand, with intermittent upper extremity support and some labored movement      AROM   Overall AROM Comments  Not tested due to precautions      Strength    Right/Left Hip  Right;Left    Right Hip Flexion  4-/5    Right Hip Extension  3+/5    Right Hip ABduction  4+/5  Left Hip Flexion  4+/5    Left Hip Extension  3+/5    Left Hip ABduction  4+/5    Right/Left Knee  Right;Left    Right Knee Flexion  4+/5    Right Knee Extension  4+/5    Left Knee Flexion  4+/5    Left Knee Extension  4+/5    Right/Left Ankle  Right;Left    Right Ankle Dorsiflexion  5/5    Right Ankle Plantar Flexion  -- WFL    Left Ankle Dorsiflexion  5/5    Left Ankle Plantar Flexion  -- [facility]      Palpation   Palpation comment  Patient denied tenderness to palpatin at incision site or in surrounding areas.       Ambulation/Gait   Gait Comments  Patient observed walking into and out of the clinic with slight trendelenberg gait      Static Standing Balance   Static Standing - Balance Support  No upper extremity supported    Static Standing - Level of Assistance  7: Independent    Static Standing Balance -  Activities   Single Leg Stance - Right Leg;Single Leg Stance - Left Leg    Static Standing - Comment/# of Minutes  Solid surface: SLS Lt. 20 seconds, SLS Rt. 4 seconds      Standardized Balance Assessment   Five times sit to stand comments   Patient performed from standard height chair without upper extremity assistance in 25.83 seconds                Objective measurements completed on examination: See above findings.              PT Education - 09/30/17 1212    Education Details  Patient was educated on examination findings and plan of care    Person(s) Educated  Patient    Methods  Explanation    Comprehension  Verbalized understanding       PT Short Term Goals - 09/30/17 1630      PT SHORT TERM GOAL #1   Title  Patient will demonstrate understanding and report regular compliance with HEP in order to improve overall functional mobility.     Time  2    Period  Weeks    Status  New    Target Date  10/14/17      PT SHORT TERM  GOAL #2   Title  Patient will demonstrate improvement of 1/2 MMT grade in all musculature tested as deficient at evaluation in order to improve squatting mechanics and kneeling mechanics.     Time  2    Period  Weeks    Status  New    Target Date  10/14/17      PT SHORT TERM GOAL #3   Title  Patient will demonstrate ability to perform SLS on each lower extremity for at least 10 seconds indicating improved balance, safety, and ease of stair negotiation.     Time  2    Period  Weeks    Status  New    Target Date  10/14/17        PT Long Term Goals - 09/30/17 1633      PT LONG TERM GOAL #1   Title  Patient will demonstrate improvement of 1 MMT grade in all musculature tested as deficient at evaluation in order to improve squatting mechanics and kneeling mechanics.     Time  4    Period  Weeks  Status  New    Target Date  10/28/17      PT LONG TERM GOAL #2   Title  Patient will demonstrate improvement on 5xSTS test of 10 seconds indicating improved balance and functional mobility.     Time  4    Period  Weeks    Status  New    Target Date  10/28/17      PT LONG TERM GOAL #3   Title  Patient will demonstrate a 10% increase on FOTO score indicating improved perceived functional mobility.     Time  4    Period  Weeks    Status  New    Target Date  10/28/17      PT LONG TERM GOAL #4   Title  Patient will demonstrate half-kneel to stand without labored movements and proper form to protect back and to assist patient with gardening activities.     Time  4    Period  Weeks    Status  New    Target Date  10/28/17             Plan - 09/30/17 1637    Clinical Impression Statement  Patient is a 72 year old male who presented to physical therapy S/P lumbar decompression surgery at L4-5. Upon examination, patient demonstrated weakness in bilateral lower extremities as well as functional back and core weakness. Patient demonstrated decreased balance with single leg stance as  well as with 5xSTS. With functional movements such as with squatting and with half kneeling to standing patient demonstrated deviations in form as well as labored movement. Patient scored a 53% on the FOTO indicating his perceived level of functional mobility. Patient would benefit from skilled physical therapy to address the abovementioned deficits and help patient return to his prior level of function.     History and Personal Factors relevant to plan of care:  S/P Lumbar decompression surgery at L4-L5 on 08/18/17; A-fib; intermittent bronchitis; HTN    Clinical Presentation  Stable    Clinical Presentation due to:  FOTO, MMT, 5xSTS, clinical judgement    Clinical Decision Making  Low    Rehab Potential  Good    Clinical Impairments Affecting Rehab Potential  Positive: Motivated, social support. Negative: comorbidities    PT Frequency  2x / week    PT Duration  4 weeks    PT Treatment/Interventions  ADLs/Self Care Home Management;DME Instruction;Gait training;Stair training;Functional mobility training;Therapeutic activities;Therapeutic exercise;Balance training;Neuromuscular re-education;Patient/family education;Orthotic Fit/Training;Manual techniques;Passive range of motion;Dry needling;Energy conservation;Taping    PT Next Visit Plan  Follow back precautions. Review evaluation and goals, initiate HEP including lumbar stabilization exercises. Initiate functional lower extremity strengthening. Progress to balance exercises as able. Practice half-kneel to stand and discuss strategies for gardening with decreased stress on back.     PT Home Exercise Plan  Initiate at first session    Consulted and Agree with Plan of Care  Patient       Patient will benefit from skilled therapeutic intervention in order to improve the following deficits and impairments:  Abnormal gait, Decreased balance, Decreased mobility, Difficulty walking, Improper body mechanics, Decreased activity tolerance, Decreased  strength  Visit Diagnosis: Muscle weakness (generalized)  Other abnormalities of gait and mobility  Other symptoms and signs involving the musculoskeletal system     Problem List Patient Active Problem List   Diagnosis Date Noted  . Spinal stenosis of lumbar region 08/18/2017  . Spinal stenosis at L4-L5 level 08/18/2017  . History  of colonic polyps 11/30/2016  . Guaiac positive stools 11/10/2015  . CAP (community acquired pneumonia) 04/10/2015  . Fatigue 04/10/2015  . Hypertension 04/10/2015  . Pneumonia 04/10/2015  . HCAP (healthcare-associated pneumonia)   . Persistent atrial fibrillation ([place])   . Anticoagulation adequate 02/21/2015  . OSA (obstructive sleep apnea) 02/01/2015  . Atrial fibrillation ([place]) 02/01/2015  . History of gout 02/01/2015  . Morbid obesity ([place]) 02/01/2015  . Pain in left knee 02/24/2012  . Weakness of left leg 02/24/2012  . Gout   . Cataract   . Eye worm   . S/P total knee replacement   . DDD (degenerative disc disease), lumbosacral   . Radicular syndrome of left leg   . Left knee DJD    Clarene Critchley PT, DPT 4:49 PM, 09/30/17 [place], [place], [address] Phone: [phone]   Fax:  [phone]  Name: Garrett Munoz MRN: [id number] Date of Birth: 1945/06/26

## 2017-10-03 MED FILL — ELIQUIS 5 MG TABLET: 5 | 90 days supply | Qty: 180 | Fill #0

## 2017-10-04 ENCOUNTER — Encounter: Payer: Self-pay | Admitting: Family Medicine

## 2017-10-04 ENCOUNTER — Ambulatory Visit (INDEPENDENT_AMBULATORY_CARE_PROVIDER_SITE_OTHER): Payer: PPO | Admitting: Family Medicine

## 2017-10-04 VITALS — BP 132/84 | HR 84 | Temp 97.8°F | Resp 18 | Ht 70.5 in | Wt 302.0 lb

## 2017-10-04 DIAGNOSIS — Z1159 Encounter for screening for other viral diseases: Secondary | ICD-10-CM

## 2017-10-04 DIAGNOSIS — G4733 Obstructive sleep apnea (adult) (pediatric): Secondary | ICD-10-CM | POA: Diagnosis not present

## 2017-10-04 DIAGNOSIS — M48061 Spinal stenosis, lumbar region without neurogenic claudication: Secondary | ICD-10-CM

## 2017-10-04 DIAGNOSIS — I481 Persistent atrial fibrillation: Secondary | ICD-10-CM | POA: Diagnosis not present

## 2017-10-04 DIAGNOSIS — Z96659 Presence of unspecified artificial knee joint: Secondary | ICD-10-CM | POA: Diagnosis not present

## 2017-10-04 DIAGNOSIS — M5137 Other intervertebral disc degeneration, lumbosacral region: Secondary | ICD-10-CM | POA: Diagnosis not present

## 2017-10-04 DIAGNOSIS — Z0000 Encounter for general adult medical examination without abnormal findings: Secondary | ICD-10-CM | POA: Diagnosis not present

## 2017-10-04 DIAGNOSIS — Z125 Encounter for screening for malignant neoplasm of prostate: Secondary | ICD-10-CM

## 2017-10-04 DIAGNOSIS — I10 Essential (primary) hypertension: Secondary | ICD-10-CM | POA: Diagnosis not present

## 2017-10-04 DIAGNOSIS — I4819 Other persistent atrial fibrillation: Secondary | ICD-10-CM

## 2017-10-04 LAB — TSH: TSH: 0.97 mIU/L (ref 0.40–4.50)

## 2017-10-04 NOTE — Progress Notes (Signed)
Subjective:    Patient ID: Garrett Munoz, male    DOB: 03/01/1946, 72 y.o.   MRN: [id number]  HPI  Patient is here today for complete physical exam.  However he is the happiest I have seen him since he became my patient.  The patient was in near almost constant pain in his lower back.  As the patient explained, he cannot lie down, sit up, or stand up without severe pain with radiculopathy into his legs.  Recently he underwent surgery for lumbar spinal stenosis due to herniated disc and since his surgery, he states that the pain has almost completely gone away.  His quality of life has improved dramatically.  The patient is extremely grateful for Dr. [name] having helped him so dramatically.  He has no concerns today.  His immunizations are up-to-date except for the shingles vaccine: Immunization History  Administered Date(s) Administered  . Influenza, High Dose Seasonal PF 01/04/2017  . Influenza-Unspecified 01/18/2016, 01/24/2017  . Pneumococcal Conjugate-13 01/04/2017  . Pneumococcal-Unspecified 02/17/2017   We discussed the shingles vaccine at length and I recommended that he check on the price first.  His colonoscopy was performed last year was significant for 5 polyps.  Patient is due for repeat colonoscopy in 3 years, 2021.  He is due for prostate cancer screening today.  He denies any falls.  He denies any depression.  Since his pain has improved, his insomnia has also improved.  He is now starting to try to exercise more to lose weight and his wife is working on his diet. Wt Readings from Last 3 Encounters:  10/04/17 (!) 302 lb (137 kg)  08/10/17 (!) 304 lb 11.2 oz (138.2 kg)  05/27/17 (!) 306 lb (138.8 kg)    Past Medical History:  Diagnosis Date  . Atrial fibrillation ([place])   . Bronchitis    hx  . Cataract    right eye  . DDD (degenerative disc disease), lumbosacral   . Dysrhythmia    irregular heatbeat  . Eye worm    right eye  . Gout   . History of kidney stones   .  Hypertension   . Left knee DJD   . Morbid obesity with BMI of 40.0-44.9, adult ([place])   . Pneumonia    hx  . Prediabetes   . Radicular syndrome of left leg   . S/P total knee replacement    right  . Sleep apnea    can't use sleep study 10 yrs ago  . 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    Past Surgical History:  Procedure Laterality Date  . CARDIOVERSION N/A 03/04/2015   Procedure: CARDIOVERSION;  Surgeon: [name], MD;  Location: [facility];  Service: Cardiovascular;  Laterality: N/A;  . CHOLECYSTECTOMY    . COLONOSCOPY N/A 01/01/2016   Procedure: COLONOSCOPY;  Surgeon: [name], MD;  [facility];  Service: Endoscopy;  Laterality: N/A;  1:45  . COLONOSCOPY N/A 01/14/2017   Procedure: COLONOSCOPY;  Surgeon: [name], MD;  [facility];  Service: Endoscopy;  Laterality: N/A;  12:05  . EYE EXAMINATION UNDER ANESTHESIA W/ RETINAL CRYOTHERAPY AND RETINAL LASER  07/2011  . EYE SURGERY  02/2011   cat rt  . KNEE ARTHROSCOPY     bilateral  . LUMBAR LAMINECTOMY/DECOMPRESSION MICRODISCECTOMY N/A 08/18/2017   Procedure: Bilateral microlumbar decompression Lumbar four-Lumbar five;  Surgeon: [name], MD;  Location: [facility];  Service: Orthopedics;  Laterality: N/A;  . POLYPECTOMY  01/01/2016   Procedure: POLYPECTOMY;  Surgeon: [name], MD;  [facility];  Service: Endoscopy;;  colon  . RETINAL DETACHMENT SURGERY  03/2011   rt  . TOTAL KNEE ARTHROPLASTY  2003   rt  . TOTAL KNEE ARTHROPLASTY  02/07/2012   Procedure: TOTAL KNEE ARTHROPLASTY;  Surgeon: [name], MD;  Location: [facility];  Service: Orthopedics;  Laterality: Left;   Current Outpatient Medications on File Prior to Visit  Medication Sig Dispense Refill  . acetaminophen (TYLENOL) 325 MG tablet Take 2 tablets (650 mg total) by mouth every 6 (six) hours as needed (or Fever >/= 101).    [name] allopurinol (ZYLOPRIM) 300 MG tablet Take 1 tablet (300 mg total) by mouth daily. 90 tablet  3  . apixaban (ELIQUIS) 5 MG TABS tablet Take 1 tablet (5 mg total) by mouth 2 (two) times daily. Resume 5 days post-op 180 tablet 3  . carvedilol (COREG) 12.5 MG tablet TAKE 1 & 1/2 TABLETS IN THE MORNING AND 1 TABLET IN THE EVENING (Patient taking differently: Take 12.5-18.75 mg by mouth 2 (two) times daily. TAKE 1 & 1/2 TABLETS IN THE MORNING AND 1 TABLET IN THE EVENING  Pt reports he takes 1 tablet in the morning and 1 at night) 225 tablet 3  . docusate sodium (COLACE) 100 MG capsule Take 1 capsule (100 mg total) by mouth daily as needed. 40 capsule 1  . magnesium oxide (MAG-OX) 400 MG tablet Take 400 mg by mouth every evening.     . nortriptyline (PAMELOR) 50 MG capsule TAKE 1 CAPSULE BY MOUTH AT BEDTIME. 30 capsule 1  . polyethylene glycol (MIRALAX) packet Take 17 g by mouth daily. 14 each 0  . temazepam (RESTORIL) 30 MG capsule TAKE 1 CAPSULE BY MOUTH AT BEDTIME AS NEEDED FOR SLEEP. 30 capsule 1  . oxyCODONE (ROXICODONE) 5 MG immediate release tablet Take 1-2 tablets (5-10 mg total) by mouth every 6 (six) hours as needed. (Patient not taking: Reported on 10/04/2017) 40 tablet 0   No current facility-administered medications on file prior to visit.    No Known Allergies Social History   Socioeconomic History  . Marital status: Married    Spouse name: Not on file  . Number of children: Not on file  . Years of education: Not on file  . Highest education level: Not on file  Occupational History  . Not on file  Social Needs  . Financial resource strain: Not on file  . Food insecurity:    Worry: Not on file    Inability: Not on file  . Transportation needs:    Medical: Not on file    Non-medical: Not on file  Tobacco Use  . Smoking status: Former Smoker    Packs/day: 2.00    Years: 15.00    Pack years: 30.00    Types: Cigarettes    Last attempt to quit: 02/02/1992    Years since quitting: 25.6  . Smokeless tobacco: Never Used  . Tobacco comment: occ alcohol  Substance and  Sexual Activity  . Alcohol use: Yes    Alcohol/week: 0.0 oz    Comment: occasional  . Drug use: No  . Sexual activity: Not on file  Lifestyle  . Physical activity:    Days per week: Not on file    Minutes per session: Not on file  . Stress: Not on file  Relationships  . Social connections:    Talks on phone: Not on file  Gets together: Not on file    Attends religious service: Not on file    Active member of club or organization: Not on file    Attends meetings of clubs or organizations: Not on file    Relationship status: Not on file  . Intimate partner violence:    Fear of current or ex partner: Not on file    Emotionally abused: Not on file    Physically abused: Not on file    Forced sexual activity: Not on file  Other Topics Concern  . Not on file  Social History Narrative  . Not on file      Review of Systems  All other systems reviewed and are negative.      Objective:   Physical Exam  Constitutional: He is oriented to person, place, and time. He appears well-developed and well-nourished. No distress.  HENT:  Head: Normocephalic and atraumatic.  Right Ear: External ear normal.  Left Ear: External ear normal.  Nose: Nose normal.  Mouth/Throat: Oropharynx is clear and moist. No oropharyngeal exudate.  Eyes: Pupils are equal, round, and reactive to light. Conjunctivae and EOM are normal. Right eye exhibits no discharge. Left eye exhibits no discharge. No scleral icterus.  Neck: Neck supple. No JVD present. No tracheal deviation present. No thyromegaly present.  Cardiovascular: Normal rate and normal heart sounds. An irregularly irregular rhythm present. Exam reveals no gallop and no friction rub.  No murmur heard. Pulmonary/Chest: Effort normal and breath sounds normal. No stridor. No respiratory distress. He has no wheezes. He has no rales. He exhibits no tenderness.  Abdominal: Soft. Bowel sounds are normal. He exhibits no distension and no mass. There is no  tenderness. There is no rebound and no guarding.  Genitourinary: Rectum normal and prostate normal.  Musculoskeletal: He exhibits no edema.  Lymphadenopathy:    He has no cervical adenopathy.  Neurological: He is alert and oriented to person, place, and time. He has normal reflexes. He displays normal reflexes. No cranial nerve deficit. He exhibits normal muscle tone. Coordination normal.  Skin: Skin is warm. No rash noted. He is not diaphoretic. No erythema. No pallor.  Psychiatric: He has a normal mood and affect. His behavior is normal. Judgment and thought content normal.  Vitals reviewed.         Assessment & Plan:  Essential hypertension - Plan: CBC with Differential/Platelet, COMPLETE METABOLIC PANEL WITH GFR, Lipid panel, PSA  Spinal stenosis of lumbar region, unspecified whether neurogenic claudication present  Persistent atrial fibrillation ([place])  General medical exam - Plan: CBC with Differential/Platelet, COMPLETE METABOLIC PANEL WITH GFR, Lipid panel, PSA, Hepatitis C Antibody, TSH  Prostate cancer screening - Plan: PSA  Encounter for hepatitis C screening test for low risk patient - Plan: Hepatitis C Antibody  DDD (degenerative disc disease), lumbosacral  Status post total knee replacement, unspecified laterality  OSA (obstructive sleep apnea)  I am truly very happy for the patient.  His pain has dramatically improved and as a result his quality of life is improved.  His immunizations are up-to-date except for the shingles vaccine.  We discussed shin Grix at length and he will check on the price first.  His colonoscopy is up-to-date.  Prostate exam was performed today and was normal however I will also check a PSA.  I will screen the patient for hepatitis C given his age.  I will also check a CBC, CMP, fasting lipid panel.  His blood pressure is well controlled.  His heart  rate is well controlled and is appropriately anticoagulated.  He denies any problems with falls  or depression.  The biggest concern I have for the patient moving forward is his obesity.  I encouraged diet exercise and weight loss.  The patient believes now that his back is not torturing him as it has the previous few years, he will be able to become more physically active to try to lose weight.  Both he and his wife are working on this.  I have very happy for this patient.

## 2017-10-05 ENCOUNTER — Encounter (HOSPITAL_COMMUNITY): Payer: Self-pay

## 2017-10-05 ENCOUNTER — Ambulatory Visit (HOSPITAL_COMMUNITY): Payer: PPO

## 2017-10-05 DIAGNOSIS — R29898 Other symptoms and signs involving the musculoskeletal system: Secondary | ICD-10-CM

## 2017-10-05 DIAGNOSIS — M6281 Muscle weakness (generalized): Secondary | ICD-10-CM

## 2017-10-05 DIAGNOSIS — R2689 Other abnormalities of gait and mobility: Secondary | ICD-10-CM

## 2017-10-05 NOTE — Patient Instructions (Addendum)
Bracing With Leg March (Hook-Lying)    With neutral spine, tighten pelvic floor and abdominals and hold. Alternating legs, lift foot 10 inches and return to floor. Repeat 10 times. Do 1-2 times a day.   Copyright  VHI. All rights reserved.   Bridging    Slowly raise buttocks from floor, keeping stomach tight. Repeat 10 times per set. Do 1-2 sets per session. Do 2 sessions per day.  http://orth.exer.us/1096   Copyright  VHI. All rights reserved.   Functional Quadriceps: Sit to Stand    Sit on edge of chair, feet flat on floor. Stand upright, extending knees fully. Repeat 10 times per set. Do 1-2 sets per session. Do 2 sessions per day.  http://orth.exer.us/734   Copyright  VHI. All rights reserved.    Heel Raise (Calf Strength / Balance)    Stand with support. Breathe in. Rise up on tiptoes, breathing out through pursed lips. Hold position to count of 5. Return slowly, breathing in. Repeat 10 times per session. Do 1-2 sessions per day. Variation: Do without weights.  Copyright  VHI. All rights reserved.   Marching In-Place    Standing straight, alternate bringing knees toward trunk. Bent arms swing alternately. Do 10 sets. Do 1-2 times per day.  Copyright  VHI. All rights reserved.    SINGLE LIMB STANCE    Stance: single leg on floor. Raise leg. Hold 30 seconds. Repeat with other leg. 3 reps per set, 1-2 sets per day.  Copyright  VHI. All rights reserved.

## 2017-10-05 NOTE — Therapy (Signed)
[facility] [place], [place], [address] Phone: [phone]   Fax:  [phone]  Physical Therapy Treatment  Patient Details  Name: Garrett Munoz MRN: [id number] Date of Birth: 02-15-1946 Referring Provider: [name], MD   Encounter Date: 10/05/2017  PT End of Session - 10/05/17 1337    Visit Number  2    Number of Visits  9    Date for PT Re-Evaluation  10/28/17    Authorization Type  [employer] (No auth required and visits based on medical necessity)    Authorization Time Period  09/30/17 - 10/28/17    Authorization - Visit Number  2    Authorization - Number of Visits  10    PT Start Time  8338    PT Stop Time  2505    PT Time Calculation (min)  47 min    Activity Tolerance  Patient tolerated treatment well;No increased pain    Behavior During Therapy  WFL for tasks assessed/performed       Past Medical History:  Diagnosis Date  . Atrial fibrillation ([place])   . Bronchitis    hx  . Cataract    right eye  . DDD (degenerative disc disease), lumbosacral   . Dysrhythmia    irregular heatbeat  . Eye worm    right eye  . Gout   . History of kidney stones   . Hypertension   . Left knee DJD   . Morbid obesity with BMI of 40.0-44.9, adult ([place])   . Pneumonia    hx  . Prediabetes   . Radicular syndrome of left leg   . S/P total knee replacement    right  . Sleep apnea    can't use sleep study 10 yrs ago  . 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     Past Surgical History:  Procedure Laterality Date  . CARDIOVERSION N/A 03/04/2015   Procedure: CARDIOVERSION;  Surgeon: [name], MD;  Location: [facility];  Service: Cardiovascular;  Laterality: N/A;  . CHOLECYSTECTOMY    . COLONOSCOPY N/A 01/01/2016   Procedure: COLONOSCOPY;  Surgeon: [name], MD;  [facility];  Service: Endoscopy;  Laterality: N/A;  1:45  . COLONOSCOPY N/A 01/14/2017   Procedure: COLONOSCOPY;  Surgeon: [name], MD;  [facility];  Service: Endoscopy;  Laterality: N/A;  12:05  . EYE EXAMINATION UNDER ANESTHESIA W/ RETINAL CRYOTHERAPY AND RETINAL LASER  07/2011  . EYE SURGERY  02/2011   cat rt  . KNEE ARTHROSCOPY     bilateral  . LUMBAR LAMINECTOMY/DECOMPRESSION MICRODISCECTOMY N/A 08/18/2017   Procedure: Bilateral microlumbar decompression Lumbar four-Lumbar five;  Surgeon: [name], MD;  Location: [facility];  Service: Orthopedics;  Laterality: N/A;  . POLYPECTOMY  01/01/2016   Procedure: POLYPECTOMY;  Surgeon: [name], MD;  [facility];  Service: Endoscopy;;  colon  . RETINAL DETACHMENT SURGERY  03/2011   rt  . TOTAL KNEE ARTHROPLASTY  2003   rt  . TOTAL KNEE ARTHROPLASTY  02/07/2012   Procedure: TOTAL KNEE ARTHROPLASTY;  Surgeon: [name], MD;  Location: [facility];  Service: Orthopedics;  Laterality: Left;    There were no vitals filed for this visit.  Subjective Assessment - 10/05/17 1336    Subjective  Pt stated his back is feeling good today, no reports of pain.  Stated he went to surgeon last week and has been relesase and went  PCP yesterday, stated all looks good    Pertinent History  S/P Microlumbar decompression L4-L5; bilaterally, foraminotomies L4-L5, bilaterally; microdikectomy L4-L5, right    Patient Stated Goals  Improve strength and prevent having to have any other surgeries    Currently in Pain?  No/denies                       [facility] Adult PT Treatment/Exercise - 10/05/17 0001      Bed Mobility   Bed Mobility  Sit to Sidelying Left    Sit to Sidelying Left  Supervision/Verbal cueing;Other (comment)      Exercises   Exercises  Lumbar      Lumbar Exercises: Standing   Heel Raises  20 reps;Limitations    Heel Raises Limitations  toe raises    Functional Squats  10 reps    Other Standing Lumbar Exercises  Marching 10x 3"    Other Standing Lumbar Exercises  SLS Rt 11", Lt 24" max of 3      Lumbar Exercises: Seated    Sit to Stand  5 reps;Limitations    Sit to Stand Limitations  eccentric control no HHA      Lumbar Exercises: Supine   Bent Knee Raise  10 reps;3 seconds    Bent Knee Raise Limitations  with ab set    Bridge  10 reps;3 seconds 2 sets      Lumbar Exercises: Sidelying   Clam  10 reps;3 seconds    Clam Limitations  with RTB    Hip Abduction  10 reps    Hip Abduction Limitations  verbal and tactile cueing to reduce posterior lean             PT Education - 10/05/17 1343    Education Details  Reviewed goals and copy of eval given to pt.  Reviewed precautions (no BLT).  Explained importance of proper bed mobility and core strengthening to support lumbar region.  Established HEP.      Person(s) Educated  Patient    Methods  Explanation;Demonstration;Handout    Comprehension  Verbalized understanding;Returned demonstration       PT Short Term Goals - 09/30/17 1630      PT SHORT TERM GOAL #1   Title  Patient will demonstrate understanding and report regular compliance with HEP in order to improve overall functional mobility.     Time  2    Period  Weeks    Status  New    Target Date  10/14/17      PT SHORT TERM GOAL #2   Title  Patient will demonstrate improvement of 1/2 MMT grade in all musculature tested as deficient at evaluation in order to improve squatting mechanics and kneeling mechanics.     Time  2    Period  Weeks    Status  New    Target Date  10/14/17      PT SHORT TERM GOAL #3   Title  Patient will demonstrate ability to perform SLS on each lower extremity for at least 10 seconds indicating improved balance, safety, and ease of stair negotiation.     Time  2    Period  Weeks    Status  New    Target Date  10/14/17        PT Long Term Goals - 09/30/17 1633      PT LONG TERM GOAL #1   Title  Patient will demonstrate improvement of 1 MMT grade  in all musculature tested as deficient at evaluation in order to improve squatting mechanics and kneeling  mechanics.     Time  4    Period  Weeks    Status  New    Target Date  10/28/17      PT LONG TERM GOAL #2   Title  Patient will demonstrate improvement on 5xSTS test of 10 seconds indicating improved balance and functional mobility.     Time  4    Period  Weeks    Status  New    Target Date  10/28/17      PT LONG TERM GOAL #3   Title  Patient will demonstrate a 10% increase on FOTO score indicating improved perceived functional mobility.     Time  4    Period  Weeks    Status  New    Target Date  10/28/17      PT LONG TERM GOAL #4   Title  Patient will demonstrate half-kneel to stand without labored movements and proper form to protect back and to assist patient with gardening activities.     Time  4    Period  Weeks    Status  New    Target Date  10/28/17            Plan - 10/05/17 1441    Clinical Impression Statement  Reviewed goals and copy of eval given to pt.  Session focus on lumbar stability with LE strengthening.  Reviewed back precautions, pt able to state not to twist or reach too far forward.  Pt educated on no bending, lifting, or twisting precautions with verbalized understanding.  Pt also educated on proper bed mobility with log rolling to reduce strain on lumbar muscualture.   Pt required multimodal cueing to achieve abdominal sets and to continue breathing with core exercises as well as proper form with sidelying gluteal strengthening exercises.  Progressed to CKC for LE strengthening with verbal and tactile cueing for proper form with exercises.  No reports of pain through sesson, was limited by fatigue with activites.  Established HEP, pt able to verbalize and demonstrate proper form with exercises.      Rehab Potential  Good    Clinical Impairments Affecting Rehab Potential  Positive: Motivated, social support. Negative: comorbidities    PT Frequency  2x / week    PT Duration  4 weeks    PT Treatment/Interventions  ADLs/Self Care Home Management;DME  Instruction;Gait training;Stair training;Functional mobility training;Therapeutic activities;Therapeutic exercise;Balance training;Neuromuscular re-education;Patient/family education;Orthotic Fit/Training;Manual techniques;Passive range of motion;Dry needling;Energy conservation;Taping    PT Next Visit Plan  Follow back precautions to assure pt has understanding. Review compliance wiht HEP.  Progress lumbar stabilization exercises. Initiate functional lower extremity strengthening. Progress to balance exercises as able. Practice half-kneel to stand and discuss strategies for gardening with decreased stress on back.     PT Home Exercise Plan  6/19: bent knee raise, bridge, STS, standing marching, heel raises and SLS       Patient will benefit from skilled therapeutic intervention in order to improve the following deficits and impairments:  Abnormal gait, Decreased balance, Decreased mobility, Difficulty walking, Improper body mechanics, Decreased activity tolerance, Decreased strength  Visit Diagnosis: Muscle weakness (generalized)  Other abnormalities of gait and mobility  Other symptoms and signs involving the musculoskeletal system     Problem List Patient Active Problem List   Diagnosis Date Noted  . Spinal stenosis of lumbar region 08/18/2017  . Spinal stenosis  at L4-L5 level 08/18/2017  . History of colonic polyps 11/30/2016  . Guaiac positive stools 11/10/2015  . CAP (community acquired pneumonia) 04/10/2015  . Fatigue 04/10/2015  . Hypertension 04/10/2015  . Pneumonia 04/10/2015  . HCAP (healthcare-associated pneumonia)   . Persistent atrial fibrillation ([place])   . Anticoagulation adequate 02/21/2015  . OSA (obstructive sleep apnea) 02/01/2015  . Atrial fibrillation ([place]) 02/01/2015  . History of gout 02/01/2015  . Morbid obesity ([place]) 02/01/2015  . Pain in left knee 02/24/2012  . Weakness of left leg 02/24/2012  . Gout   . Cataract   . Eye worm   . S/P total knee  replacement   . DDD (degenerative disc disease), lumbosacral   . Radicular syndrome of left leg   . Left knee DJD    Ihor Austin, LPTA; [place]  Aldona Lento 10/05/2017, 4:26 PM  [facility] [address] [place], [place], [address] Phone: [phone]   Fax:  [phone]  Name: Garrett Munoz MRN: [id number] Date of Birth: Aug 11, 1945

## 2017-10-06 ENCOUNTER — Encounter: Payer: Self-pay | Admitting: *Deleted

## 2017-10-06 ENCOUNTER — Ambulatory Visit (HOSPITAL_COMMUNITY): Payer: PPO

## 2017-10-06 DIAGNOSIS — M6281 Muscle weakness (generalized): Secondary | ICD-10-CM

## 2017-10-06 DIAGNOSIS — R29898 Other symptoms and signs involving the musculoskeletal system: Secondary | ICD-10-CM

## 2017-10-06 DIAGNOSIS — R2689 Other abnormalities of gait and mobility: Secondary | ICD-10-CM

## 2017-10-06 NOTE — Therapy (Signed)
[place], [place], [address] Phone: [phone]   Fax:  [phone]  Physical Therapy Treatment  Patient Details  Name: Garrett Munoz MRN: [id number] Date of Birth: 10-31-1945 Referring Provider: [name], MD   Encounter Date: 10/06/2017  PT End of Session - 10/06/17 1345    Visit Number  3    Number of Visits  9    Date for PT Re-Evaluation  10/28/17    Authorization Type  [employer] (No auth required and visits based on medical necessity)    Authorization Time Period  09/30/17 - 10/28/17    Authorization - Visit Number  3    Authorization - Number of Visits  10    PT Start Time  7616    PT Stop Time  0737 pt requested to end session early    PT Time Calculation (min)  26 min    Activity Tolerance  Patient tolerated treatment well;No increased pain    Behavior During Therapy  WFL for tasks assessed/performed       Past Medical History:  Diagnosis Date  . Atrial fibrillation ([place])   . Bronchitis    hx  . Cataract    right eye  . DDD (degenerative disc disease), lumbosacral   . Dysrhythmia    irregular heatbeat  . Eye worm    right eye  . Gout   . History of kidney stones   . Hypertension   . Left knee DJD   . Morbid obesity with BMI of 40.0-44.9, adult ([place])   . Pneumonia    hx  . Prediabetes   . Radicular syndrome of left leg   . S/P total knee replacement    right  . Sleep apnea    can't use sleep study 10 yrs ago  . 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     Past Surgical History:  Procedure Laterality Date  . CARDIOVERSION N/A 03/04/2015   Procedure: CARDIOVERSION;  Surgeon: [name], MD;  Location: [facility];  Service: Cardiovascular;  Laterality: N/A;  . CHOLECYSTECTOMY    . COLONOSCOPY N/A 01/01/2016   Procedure: COLONOSCOPY;  Surgeon: [name], MD;  [facility];  Service: Endoscopy;  Laterality: N/A;  1:45  . COLONOSCOPY N/A 01/14/2017    Procedure: COLONOSCOPY;  Surgeon: [name], MD;  [facility];  Service: Endoscopy;  Laterality: N/A;  12:05  . EYE EXAMINATION UNDER ANESTHESIA W/ RETINAL CRYOTHERAPY AND RETINAL LASER  07/2011  . EYE SURGERY  02/2011   cat rt  . KNEE ARTHROSCOPY     bilateral  . LUMBAR LAMINECTOMY/DECOMPRESSION MICRODISCECTOMY N/A 08/18/2017   Procedure: Bilateral microlumbar decompression Lumbar four-Lumbar five;  Surgeon: [name], MD;  Location: [facility];  Service: Orthopedics;  Laterality: N/A;  . POLYPECTOMY  01/01/2016   Procedure: POLYPECTOMY;  Surgeon: [name], MD;  [facility];  Service: Endoscopy;;  colon  . RETINAL DETACHMENT SURGERY  03/2011   rt  . TOTAL KNEE ARTHROPLASTY  2003   rt  . TOTAL KNEE ARTHROPLASTY  02/07/2012   Procedure: TOTAL KNEE ARTHROPLASTY;  Surgeon: [name], MD;  Location: [facility];  Service: Orthopedics;  Laterality: Left;    There were no vitals filed for this visit.  Subjective Assessment - 10/06/17 1346    Subjective  Pt reports that he's sore as a boil today from yesterday. No LBP or any other pain, just the muscle  soreness.    Pertinent History  S/P Microlumbar decompression L4-L5; bilaterally, foraminotomies L4-L5, bilaterally; microdikectomy L4-L5, right    Patient Stated Goals  Improve strength and prevent having to have any other surgeries    Currently in Pain?  -- 8/10 muscle soreness, no pain            [facility] Adult PT Treatment/Exercise - 10/06/17 0001      Lumbar Exercises: Stretches   Quad Stretch  Right;Left;3 reps;30 seconds    Quad Stretch Limitations  prone with rope      Lumbar Exercises: Supine   Bent Knee Raise Limitations  attempted to perform but unable due to R hip flexor pain      Manual Therapy   Manual Therapy  Myofascial release    Manual therapy comments  completed separate rest of treatment    Myofascial Release  R hip flexor in supine for pain control            PT Education  - 10/06/17 1345    Education Details  exercise technique, continue HEP; add prone quad stretch for pain control    Person(s) Educated  Patient    Methods  Explanation;Demonstration;Handout    Comprehension  Verbalized understanding;Returned demonstration       PT Short Term Goals - 09/30/17 1630      PT SHORT TERM GOAL #1   Title  Patient will demonstrate understanding and report regular compliance with HEP in order to improve overall functional mobility.     Time  2    Period  Weeks    Status  New    Target Date  10/14/17      PT SHORT TERM GOAL #2   Title  Patient will demonstrate improvement of 1/2 MMT grade in all musculature tested as deficient at evaluation in order to improve squatting mechanics and kneeling mechanics.     Time  2    Period  Weeks    Status  New    Target Date  10/14/17      PT SHORT TERM GOAL #3   Title  Patient will demonstrate ability to perform SLS on each lower extremity for at least 10 seconds indicating improved balance, safety, and ease of stair negotiation.     Time  2    Period  Weeks    Status  New    Target Date  10/14/17        PT Long Term Goals - 09/30/17 1633      PT LONG TERM GOAL #1   Title  Patient will demonstrate improvement of 1 MMT grade in all musculature tested as deficient at evaluation in order to improve squatting mechanics and kneeling mechanics.     Time  4    Period  Weeks    Status  New    Target Date  10/28/17      PT LONG TERM GOAL #2   Title  Patient will demonstrate improvement on 5xSTS test of 10 seconds indicating improved balance and functional mobility.     Time  4    Period  Weeks    Status  New    Target Date  10/28/17      PT LONG TERM GOAL #3   Title  Patient will demonstrate a 10% increase on FOTO score indicating improved perceived functional mobility.     Time  4    Period  Weeks    Status  New    Target Date  10/28/17      PT LONG TERM GOAL #4   Title  Patient will demonstrate half-kneel  to stand without labored movements and proper form to protect back and to assist patient with gardening activities.     Time  4    Period  Weeks    Status  New    Target Date  10/28/17            Plan - 10/06/17 1421    Clinical Impression Statement  Pt presents to therapy stating that he is very sore following his treatment session. PT educated pt that this is normal response to strengthening weak muscles and educated him that this should improve within the next 24-48 hours. Had pt perform prone quad stretch to address his c/o pain and then performed MFR to R hip flexor. Pt stating that this helped his pain, however, when he attempted bent knee raise with ab set, he was unable to perform on RLE due to R hip flexor pain. Pt asked to end session early due to his increased muscle soreness so performed MFR again to help decrease pain before ending session. PT educated pt to perform prone quad stretch at home to assist with managing his muscle soreness and educated him again that this soreness is normal response to increased activity since he has not been able to exercise over the last few years due to his back. He verbalized understanding and reported that the [place] helped decrease he pain at [facility].     Rehab Potential  Good    Clinical Impairments Affecting Rehab Potential  Positive: Motivated, social support. Negative: comorbidities    PT Frequency  2x / week    PT Duration  4 weeks    PT Treatment/Interventions  ADLs/Self Care Home Management;DME Instruction;Gait training;Stair training;Functional mobility training;Therapeutic activities;Therapeutic exercise;Balance training;Neuromuscular re-education;Patient/family education;Orthotic Fit/Training;Manual techniques;Passive range of motion;Dry needling;Energy conservation;Taping    PT Next Visit Plan  see how his muscle soreness is and progress slowly; Follow back precautions to assure pt has understanding. Review compliance wiht HEP.  Progress lumbar  stabilization exercises. Initiate functional lower extremity strengthening. Progress to balance exercises as able. Practice half-kneel to stand and discuss strategies for gardening with decreased stress on back.     PT Home Exercise Plan  6/19: bent knee raise, bridge, STS, standing marching, heel raises and SLS    Consulted and Agree with Plan of Care  Patient       Patient will benefit from skilled therapeutic intervention in order to improve the following deficits and impairments:  Abnormal gait, Decreased balance, Decreased mobility, Difficulty walking, Improper body mechanics, Decreased activity tolerance, Decreased strength  Visit Diagnosis: Muscle weakness (generalized)  Other abnormalities of gait and mobility  Other symptoms and signs involving the musculoskeletal system     Problem List Patient Active Problem List   Diagnosis Date Noted  . Spinal stenosis of lumbar region 08/18/2017  . Spinal stenosis at L4-L5 level 08/18/2017  . History of colonic polyps 11/30/2016  . Guaiac positive stools 11/10/2015  . CAP (community acquired pneumonia) 04/10/2015  . Fatigue 04/10/2015  . Hypertension 04/10/2015  . Pneumonia 04/10/2015  . HCAP (healthcare-associated pneumonia)   . Persistent atrial fibrillation ([place])   . Anticoagulation adequate 02/21/2015  . OSA (obstructive sleep apnea) 02/01/2015  . Atrial fibrillation ([place]) 02/01/2015  . History of gout 02/01/2015  . Morbid obesity ([place]) 02/01/2015  . Pain in left knee 02/24/2012  . Weakness of left leg 02/24/2012  .  Gout   . Cataract   . Eye worm   . S/P total knee replacement   . DDD (degenerative disc disease), lumbosacral   . Radicular syndrome of left leg   . Left knee DJD       Geraldine Solar PT, DPT  [facility] [address] [place], [place], [address] Phone: [phone]   Fax:  [phone]  Name: ADRIENNE DELAY MRN: [id number] Date of Birth: 1946-04-14

## 2017-10-06 NOTE — Patient Instructions (Signed)
Access Code: [email or web address]  URL: [email or web address]  Date: 10/06/2017  Prepared by: Geraldine Solar   Exercises Prone Quadriceps Stretch with Strap - 10 reps - 3 sets - 1x daily - 7x weekly

## 2017-10-11 ENCOUNTER — Ambulatory Visit (HOSPITAL_COMMUNITY): Payer: PPO | Admitting: Physical Therapy

## 2017-10-11 ENCOUNTER — Encounter (HOSPITAL_COMMUNITY): Payer: Self-pay | Admitting: Physical Therapy

## 2017-10-11 DIAGNOSIS — M6281 Muscle weakness (generalized): Secondary | ICD-10-CM | POA: Diagnosis not present

## 2017-10-11 DIAGNOSIS — R29898 Other symptoms and signs involving the musculoskeletal system: Secondary | ICD-10-CM

## 2017-10-11 DIAGNOSIS — R2689 Other abnormalities of gait and mobility: Secondary | ICD-10-CM

## 2017-10-11 NOTE — Therapy (Signed)
[facility] [address] [place], [place], [address] Phone: [phone]   Fax:  [phone]  Physical Therapy Treatment / Re-assessment / Discharge Summary  Patient Details  Name: JACCOB CZAPLICKI MRN: [id number] Date of Birth: 27-Feb-1946 Referring Provider: [name], MD   Encounter Date: 10/11/2017   Progress Note Reporting Period 09/30/17 to 10/11/17  See note below for Objective Data and Assessment of Progress/Goals.       PT End of Session - 10/11/17 1120    Visit Number  4    Number of Visits  9    Date for PT Re-Evaluation  10/28/17    Authorization Type  [employer] (No auth required and visits based on medical necessity)    Authorization Time Period  09/30/17 - 10/28/17    Authorization - Visit Number  4    Authorization - Number of Visits  10    PT Start Time  1100    PT Stop Time  1114 Patient refused to continue session    PT Time Calculation (min)  14 min    Activity Tolerance  Other (comment) Patient limited by soreness    Behavior During Therapy  [facility] for tasks assessed/performed       Past Medical History:  Diagnosis Date  . Atrial fibrillation ([place])   . Bronchitis    hx  . Cataract    right eye  . DDD (degenerative disc disease), lumbosacral   . Dysrhythmia    irregular heatbeat  . Eye worm    right eye  . Gout   . History of kidney stones   . Hypertension   . Left knee DJD   . Morbid obesity with BMI of 40.0-44.9, adult ([place])   . Pneumonia    hx  . Prediabetes   . Radicular syndrome of left leg   . S/P total knee replacement    right  . Sleep apnea    can't use sleep study 10 yrs ago  . 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     Past Surgical History:  Procedure Laterality Date  . CARDIOVERSION N/A 03/04/2015   Procedure: CARDIOVERSION;  Surgeon: [name], MD;  Location: [facility];  Service: Cardiovascular;  Laterality: N/A;  . CHOLECYSTECTOMY    . COLONOSCOPY N/A 01/01/2016   Procedure: COLONOSCOPY;  Surgeon: [name], MD;  [facility];  Service: Endoscopy;  Laterality: N/A;  1:45  . COLONOSCOPY N/A 01/14/2017   Procedure: COLONOSCOPY;  Surgeon: [name], MD;  [facility];  Service: Endoscopy;  Laterality: N/A;  12:05  . EYE EXAMINATION UNDER ANESTHESIA W/ RETINAL CRYOTHERAPY AND RETINAL LASER  07/2011  . EYE SURGERY  02/2011   cat rt  . KNEE ARTHROSCOPY     bilateral  . LUMBAR LAMINECTOMY/DECOMPRESSION MICRODISCECTOMY N/A 08/18/2017   Procedure: Bilateral microlumbar decompression Lumbar four-Lumbar five;  Surgeon: [name], MD;  Location: [facility];  Service: Orthopedics;  Laterality: N/A;  . POLYPECTOMY  01/01/2016   Procedure: POLYPECTOMY;  Surgeon: [name], MD;  [facility];  Service: Endoscopy;;  colon  . RETINAL DETACHMENT SURGERY  03/2011   rt  . TOTAL KNEE ARTHROPLASTY  2003   rt  . TOTAL KNEE ARTHROPLASTY  02/07/2012   Procedure: TOTAL KNEE ARTHROPLASTY;  Surgeon: [name], MD;  Location: [facility];  Service: Orthopedics;  Laterality: Left;    There were no vitals filed for this visit.  Subjective Assessment -  10/11/17 1117    Subjective  Patient reported that he wanted to be dicharged. He stated that he was having bilateral leg soreness and did not want to continue. Patient reported that he had walked a lot yesterday.     Pertinent History  S/P Microlumbar decompression L4-L5; bilaterally, foraminotomies L4-L5, bilaterally; microdikectomy L4-L5, right    Diagnostic tests  X-ray preoperatively on 08/10/17: "Degenerative change without acute abnormality"    Patient Stated Goals  Improve strength and prevent having to have any other surgeries    Currently in Pain?  Other (Comment) 8/10 muscle soreness, but no pain         [facility] PT Assessment - 10/11/17 0001      Assessment   Medical Diagnosis  Lumbar s/p lumbar decompression    Referring Provider  [name], MD    Onset  Date/Surgical Date  08/18/17    Hand Dominance  Right    Prior Therapy  Yes, following knee surgeries      Precautions   Precautions  Back    Precaution Comments  Patient stated no bending, lifting or twisting      Restrictions   Weight Bearing Restrictions  No      [place] residence    Living Arrangements  Spouse/significant other    Type of [place] to enter    Entrance Stairs-Number of Steps  3    Entrance Stairs-Rails  None    Home Layout  One level    [place] - single point;Walker - 2 wheels      Prior Function   Level of Independence  Independent;Independent with basic ADLs    Vocation  Retired      Charity fundraiser Status  Within Functional Limits for tasks assessed      Observation/Other Assessments   Focus on Therapeutic Outcomes (FOTO)   58% (42% limited)      Squat   Comments  Patient requested to end session and was unable to assess      Other:   Other/ Comments  Patient requested to end session and was unable to assess half kneel to stand      Strength   Right Hip Flexion  4-/5 was 4-/5    Right Hip Extension  -- Patient requested to end session and was unable to assess    Right Hip ABduction  -- Patient requested to end session and was unable to assess   Left Hip Flexion  4+/5 was 4+/5    Left Hip Extension  -- Patient requested to end session and was unable to assess    Left Hip ABduction  -- Patient requested to end session and was unable to assess    Right Knee Flexion  4+/5 was 4+/5    Right Knee Extension  4+/5 was 4+/5    Left Knee Flexion  4+/5 was 4+/5    Left Knee Extension  4+/5 was 4+/5    Right Ankle Dorsiflexion  5/5 was 5/5    Left Ankle Dorsiflexion  5/5 was 5/5      Palpation   Palpation comment  Patient requested to end session and was unable to assess      Static Standing Balance   Static Standing - Comment/# of Minutes  Patient requested to  end session and was unable to assess      Standardized Balance Assessment  Five times sit to stand comments   Patient requested to end session and was unable to assess                           PT Education - 10/11/17 1119    Education Details  Therapist educated patient to not perform any exercises that increased pain or soreness.     Person(s) Educated  Patient    Methods  Explanation    Comprehension  Verbalized understanding       PT Short Term Goals - 10/11/17 1134      PT SHORT TERM GOAL #1   Title  Patient will demonstrate understanding and report regular compliance with HEP in order to improve overall functional mobility.     Baseline  10/11/17: Unable to assess this session as patient requested to end session early and refused to complete re-assessment.     Time  2    Period  Weeks    Status  Unable to assess      PT SHORT TERM GOAL #2   Title  Patient will demonstrate improvement of 1/2 MMT grade in all musculature tested as deficient at evaluation in order to improve squatting mechanics and kneeling mechanics.     Baseline  10/11/17: Patient did not demonstrate improvement of 1/2 MMT grade with re-assessment see MMT, unable to test all muscle groups due to patient requesting to end session early.     Time  2    Period  Weeks    Status  On-going      PT SHORT TERM GOAL #3   Title  Patient will demonstrate ability to perform SLS on each lower extremity for at least 10 seconds indicating improved balance, safety, and ease of stair negotiation.     Baseline  10/11/17: Unable to assess this session as patient requested to end session early and refused to complete re-assessment.     Time  2    Period  Weeks    Status  Unable to assess        PT Long Term Goals - 10/11/17 1135      PT LONG TERM GOAL #1   Title  Patient will demonstrate improvement of 1 MMT grade in all musculature tested as deficient at evaluation in order to improve squatting  mechanics and kneeling mechanics.     Baseline  10/11/17: Patient did not demonstrate improvement of 1 MMT grade with re-assessment see MMT, unable to test all muscle groups due to patient requesting to end session early.     Time  4    Period  Weeks    Status  On-going      PT LONG TERM GOAL #2   Title  Patient will demonstrate improvement on 5xSTS test of 10 seconds indicating improved balance and functional mobility.     Baseline  10/11/17: Unable to assess this session as patient requested to end session early and refused to complete re-assessment.     Time  4    Period  Weeks    Status  Unable to assess      PT LONG TERM GOAL #3   Title  Patient will demonstrate a 10% increase on FOTO score indicating improved perceived functional mobility.     Baseline  10/11/17: Patient demonstrated a 5% increase on his FOTO score at this session.     Time  4    Period  Weeks    Status  On-going      PT LONG TERM GOAL #4   Title  Patient will demonstrate half-kneel to stand without labored movements and proper form to protect back and to assist patient with gardening activities.     Baseline  10/11/17: Unable to assess this session as patient requested to end session early and refused to complete re-assessment.     Time  4    Period  Weeks    Status  Unable to assess            Plan - 10/11/17 1151    Clinical Impression Statement  This session patient entered reporting bilateral lower extremity leg soreness of 8/10. Patient also reported that he would like it to be his last session and requested to be discharged. Therapist acknowledged this and began to complete re-assessment including FOTO and MMT. Therapist performed some MMT testing and then patient reported that he wanted to be done for today and refused any further assessment or discussion. Patient did not achieve any of the tested short term or long term goals however, many goals were not able to be tested.  Therapist educated patient to  only continue exercises as he could tolerate. In addition, educated patient to call with any further questions or concerns. Patient is being discharged at this time due his request.     Rehab Potential  Good    Clinical Impairments Affecting Rehab Potential  Positive: Motivated, social support. Negative: comorbidities    PT Frequency  2x / week    PT Duration  4 weeks    PT Treatment/Interventions  ADLs/Self Care Home Management;DME Instruction;Gait training;Stair training;Functional mobility training;Therapeutic activities;Therapeutic exercise;Balance training;Neuromuscular re-education;Patient/family education;Orthotic Fit/Training;Manual techniques;Passive range of motion;Dry needling;Energy conservation;Taping    PT Next Visit Plan  Patient being discharged at this time    PT Home Exercise Plan  6/19: bent knee raise, bridge, STS, standing marching, heel raises and SLS    Consulted and Agree with Plan of Care  Patient       Patient will benefit from skilled therapeutic intervention in order to improve the following deficits and impairments:  Abnormal gait, Decreased balance, Decreased mobility, Difficulty walking, Improper body mechanics, Decreased activity tolerance, Decreased strength  Visit Diagnosis: Muscle weakness (generalized)  Other abnormalities of gait and mobility  Other symptoms and signs involving the musculoskeletal system     Problem List Patient Active Problem List   Diagnosis Date Noted  . Spinal stenosis of lumbar region 08/18/2017  . Spinal stenosis at L4-L5 level 08/18/2017  . History of colonic polyps 11/30/2016  . Guaiac positive stools 11/10/2015  . CAP (community acquired pneumonia) 04/10/2015  . Fatigue 04/10/2015  . Hypertension 04/10/2015  . Pneumonia 04/10/2015  . HCAP (healthcare-associated pneumonia)   . Persistent atrial fibrillation ([place])   . Anticoagulation adequate 02/21/2015  . OSA (obstructive sleep apnea) 02/01/2015  . Atrial  fibrillation ([place]) 02/01/2015  . History of gout 02/01/2015  . Morbid obesity ([place]) 02/01/2015  . Pain in left knee 02/24/2012  . Weakness of left leg 02/24/2012  . Gout   . Cataract   . Eye worm   . S/P total knee replacement   . DDD (degenerative disc disease), lumbosacral   . Radicular syndrome of left leg   . Left knee DJD    PHYSICAL THERAPY DISCHARGE SUMMARY  Visits from Start of Care: 4  Current functional level related to goals / functional outcomes: See above   Remaining deficits: See above   Education /  Equipment: See above Plan: Patient agrees to discharge.  Patient goals were not met. Patient is being discharged due to the patient's request.  ?????        Clarene Critchley PT, DPT 11:54 AM, 10/11/17 [facility] [address] [place], [place], [address] Phone: [phone]   Fax:  [phone]  Name: DOMNIC VANTOL MRN: [id number] Date of Birth: 1945/08/31

## 2017-10-13 ENCOUNTER — Encounter (HOSPITAL_COMMUNITY): Payer: PPO

## 2017-10-18 ENCOUNTER — Encounter (HOSPITAL_COMMUNITY): Payer: PPO

## 2017-10-21 ENCOUNTER — Encounter (HOSPITAL_COMMUNITY): Payer: PPO

## 2017-10-25 ENCOUNTER — Encounter (HOSPITAL_COMMUNITY): Payer: PPO | Admitting: Physical Therapy

## 2017-11-01 ENCOUNTER — Other Ambulatory Visit: Payer: Self-pay | Admitting: Family Medicine

## 2017-11-01 MED FILL — NORTRIPTYLINE HCL 50 MG CAP: 50 | 30 days supply | Qty: 30 | Fill #1

## 2017-11-01 MED FILL — TEMAZEPAM 30 MG CAPSULE: 30 | 30 days supply | Qty: 30 | Fill #0

## 2017-11-01 NOTE — Telephone Encounter (Signed)
Ok to refill??  Last office visit 09/03/2017.  Last refill 08/29/2017, #1 refill.

## 2017-12-01 ENCOUNTER — Other Ambulatory Visit: Payer: Self-pay | Admitting: Family Medicine

## 2017-12-01 DIAGNOSIS — F5101 Primary insomnia: Secondary | ICD-10-CM

## 2017-12-01 MED FILL — TEMAZEPAM 30 MG CAPSULE: 30 | 30 days supply | Qty: 30 | Fill #1

## 2017-12-01 MED FILL — NORTRIPTYLINE HCL 50 MG CAP: 50 | 30 days supply | Qty: 30 | Fill #0

## 2017-12-06 ENCOUNTER — Other Ambulatory Visit: Payer: Self-pay | Admitting: Family Medicine

## 2017-12-06 MED ORDER — ZOSTER VAC RECOMB ADJUVANTED 50 MCG/0.5ML IM SUSR: 0.5000 mL | Freq: Once | INTRAMUSCULAR | 1 refills | Status: AC

## 2017-12-07 MED FILL — SHINGRIX 50 MCG SUS: 50 | 1 days supply | Qty: 1 | Fill #0

## 2018-01-03 ENCOUNTER — Other Ambulatory Visit: Payer: Self-pay | Admitting: Family Medicine

## 2018-01-03 ENCOUNTER — Other Ambulatory Visit: Payer: Self-pay | Admitting: Cardiovascular Disease

## 2018-01-03 MED FILL — TEMAZEPAM 30 MG CAPSULE: 30 | 30 days supply | Qty: 30 | Fill #0

## 2018-01-03 MED FILL — CARVEDILOL 12.5 MG TABLET: 12.5 | 90 days supply | Qty: 225 | Fill #2

## 2018-01-03 MED FILL — ALLOPURINOL 300 MG TABS: 300 | 90 days supply | Qty: 90 | Fill #0

## 2018-01-03 NOTE — Telephone Encounter (Signed)
Ok to refill Temazepam??   Last office visit 10/04/2017.  Last refill 11/01/2017, #1 refill.

## 2018-01-04 ENCOUNTER — Ambulatory Visit (INDEPENDENT_AMBULATORY_CARE_PROVIDER_SITE_OTHER): Payer: PPO | Admitting: Family Medicine

## 2018-01-04 DIAGNOSIS — Z23 Encounter for immunization: Secondary | ICD-10-CM

## 2018-01-10 DIAGNOSIS — H35371 Puckering of macula, right eye: Secondary | ICD-10-CM | POA: Diagnosis not present

## 2018-01-17 LAB — CBC WITH DIFFERENTIAL/PLATELET
BASOS ABS: 49 {cells}/uL (ref 0–200)
BASOS PCT: 0.8 %
EOS PCT: 4.4 %
Eosinophils Absolute: 268 cells/uL (ref 15–500)
HEMATOCRIT: 47.4 % (ref 38.5–50.0)
HEMOGLOBIN: 16 g/dL (ref 13.2–17.1)
LYMPHS ABS: 2074 {cells}/uL (ref 850–3900)
MCH: 31.1 pg (ref 27.0–33.0)
MCHC: 33.8 g/dL (ref 32.0–36.0)
MCV: 92 fL (ref 80.0–100.0)
MPV: 11.3 fL (ref 7.5–12.5)
Monocytes Relative: 8.4 %
NEUTROS ABS: 3196 {cells}/uL (ref 1500–7800)
Neutrophils Relative %: 52.4 %
Platelets: 203 10*3/uL (ref 140–400)
RBC: 5.15 10*6/uL (ref 4.20–5.80)
RDW: 13.7 % (ref 11.0–15.0)
Total Lymphocyte: 34 %
WBC mixed population: 512 cells/uL (ref 200–950)
WBC: 6.1 10*3/uL (ref 3.8–10.8)

## 2018-01-17 LAB — LIPID PANEL
CHOL/HDL RATIO: 2.6 (calc) (ref <5.0)
Cholesterol: 138 mg/dL (ref <200)
HDL: 53 mg/dL (ref >40)
LDL CHOLESTEROL (CALC): 70 mg/dL
NON-HDL CHOLESTEROL (CALC): 85 mg/dL (ref <130)
Triglycerides: 73 mg/dL (ref <150)

## 2018-01-17 LAB — PSA: PSA: 0.7 ng/mL (ref <=4.0)

## 2018-01-17 LAB — COMPLETE METABOLIC PANEL WITH GFR
AG Ratio: 1.4 (calc) (ref 1.0–2.5)
ALBUMIN MSPROF: 4.1 g/dL (ref 3.6–5.1)
ALT: 23 U/L (ref 9–46)
AST: 23 U/L (ref 10–35)
Alkaline phosphatase (APISO): 80 U/L (ref 40–115)
BUN: 8 mg/dL (ref 7–25)
CO2: 27 mmol/L (ref 20–32)
CREATININE: 0.99 mg/dL (ref 0.70–1.18)
Calcium: 9.4 mg/dL (ref 8.6–10.3)
Chloride: 102 mmol/L (ref 98–110)
GFR, EST AFRICAN AMERICAN: 87 mL/min/{1.73_m2} (ref >=60)
GFR, EST NON AFRICAN AMERICAN: 75 mL/min/{1.73_m2} (ref >=60)
GLOBULIN: 2.9 g/dL (ref 1.9–3.7)
Glucose, Bld: 109 mg/dL — ABNORMAL HIGH (ref 65–99)
Potassium: 4.3 mmol/L (ref 3.5–5.3)
SODIUM: 141 mmol/L (ref 135–146)
TOTAL PROTEIN: 7 g/dL (ref 6.1–8.1)
Total Bilirubin: 0.5 mg/dL (ref 0.2–1.2)

## 2018-01-17 LAB — HEPATITIS C ANTIBODY
HEP C AB: NONREACTIVE
SIGNAL TO CUT-OFF: 0.04 (ref <1.00)

## 2018-01-20 ENCOUNTER — Ambulatory Visit (INDEPENDENT_AMBULATORY_CARE_PROVIDER_SITE_OTHER): Payer: PPO | Admitting: Family Medicine

## 2018-01-20 ENCOUNTER — Telehealth: Payer: Self-pay | Admitting: Family Medicine

## 2018-01-20 ENCOUNTER — Encounter: Payer: Self-pay | Admitting: Family Medicine

## 2018-01-20 ENCOUNTER — Ambulatory Visit
Admission: RE | Admit: 2018-01-20 | Discharge: 2018-01-20 | Disposition: A | Discharge status: Home / Self Care | Payer: PPO | Source: Ambulatory Visit | Attending: Family Medicine | Admitting: Family Medicine

## 2018-01-20 VITALS — BP 134/86 | HR 100 | Temp 98.6°F | Resp 18 | Ht 70.5 in | Wt 312.0 lb

## 2018-01-20 DIAGNOSIS — M25531 Pain in right wrist: Secondary | ICD-10-CM | POA: Diagnosis not present

## 2018-01-20 DIAGNOSIS — N3281 Overactive bladder: Secondary | ICD-10-CM

## 2018-01-20 DIAGNOSIS — M79631 Pain in right forearm: Secondary | ICD-10-CM

## 2018-01-20 DIAGNOSIS — M7989 Other specified soft tissue disorders: Secondary | ICD-10-CM | POA: Diagnosis not present

## 2018-01-20 DIAGNOSIS — G4700 Insomnia, unspecified: Secondary | ICD-10-CM

## 2018-01-20 MED ORDER — OXYBUTYNIN CHLORIDE 5 MG PO TABS: 5.0000 mg | ORAL_TABLET | Freq: Every evening | ORAL | 1 refills | Status: DC | PRN

## 2018-01-20 MED FILL — OXYBUTYNIN 5 MG TABLET: 5 | 30 days supply | Qty: 30 | Fill #0

## 2018-01-20 NOTE — Progress Notes (Signed)
Subjective:    Patient ID: Garrett Munoz, male    DOB: Sep 07, 1945, 72 y.o.   MRN: [id number]  HPI Patient has a long-standing history of chronic insomnia.  He has been on temazepam 30 mg p.o. nightly for years.  He believes he has been taking that medication for probably 15 years.  As a result, he is developed a high tolerance to it and it is no longer working.  He states that he can barely sleep an hour at a time each night and then he is awake.  As a result he feels extremely tired and sleepy throughout the day.  He is also complaining of nocturia.  He states that he has to awaken 4 and 5 times every night to go urinate.  He reports very little urine volume.  However he gets sudden urges almost like spasms that make him have to run to the bathroom particularly at night although also during the daytime.  His PSA in July was completely normal at 0.7.  I performed a prostate exam today and his prostate is minimally swollen for his age without nodularity and without tenderness.  He denies any dysuria or hematuria.  His third issue is pain in his distal right forearm.  He states that this is been there for several months.  He denies any specific fall or injury.  He has pain in the anatomic snuffbox.  He has pain with dorsiflexion of the wrist.  He has severe pain with Finkelstein's maneuver.  In fact this seems to be the worse area for pain is over the distal radius along the tendon sheath.  Also some swelling in that area. Past Medical History:  Diagnosis Date  . Atrial fibrillation ([place])   . Bronchitis    hx  . Cataract    right eye  . DDD (degenerative disc disease), lumbosacral   . Dysrhythmia    irregular heatbeat  . Eye worm    right eye  . Gout   . History of kidney stones   . Hypertension   . Left knee DJD   . Morbid obesity with BMI of 40.0-44.9, adult ([place])   . Pneumonia    hx  . Prediabetes   . Radicular syndrome of left leg   . S/P total knee replacement    right  . Sleep  apnea    can't use sleep study 10 yrs ago  . 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    Past Surgical History:  Procedure Laterality Date  . CARDIOVERSION N/A 03/04/2015   Procedure: CARDIOVERSION;  Surgeon: [name], MD;  Location: [facility];  Service: Cardiovascular;  Laterality: N/A;  . CHOLECYSTECTOMY    . COLONOSCOPY N/A 01/01/2016   Procedure: COLONOSCOPY;  Surgeon: [name], MD;  [facility];  Service: Endoscopy;  Laterality: N/A;  1:45  . COLONOSCOPY N/A 01/14/2017   Procedure: COLONOSCOPY;  Surgeon: [name], MD;  [facility];  Service: Endoscopy;  Laterality: N/A;  12:05  . EYE EXAMINATION UNDER ANESTHESIA W/ RETINAL CRYOTHERAPY AND RETINAL LASER  07/2011  . EYE SURGERY  02/2011   cat rt  . KNEE ARTHROSCOPY     bilateral  . LUMBAR LAMINECTOMY/DECOMPRESSION MICRODISCECTOMY N/A 08/18/2017   Procedure: Bilateral microlumbar decompression Lumbar four-Lumbar five;  Surgeon: [name], MD;  Location: [facility];  Service: Orthopedics;  Laterality: N/A;  . POLYPECTOMY  01/01/2016   Procedure: POLYPECTOMY;  Surgeon: [name], MD;  [facility];  Service: Endoscopy;;  colon  . RETINAL DETACHMENT SURGERY  03/2011   rt  . TOTAL KNEE ARTHROPLASTY  2003   rt  . TOTAL KNEE ARTHROPLASTY  02/07/2012   Procedure: TOTAL KNEE ARTHROPLASTY;  Surgeon: [name], MD;  Location: [facility];  Service: Orthopedics;  Laterality: Left;   Current Outpatient Medications on File Prior to Visit  Medication Sig Dispense Refill  . acetaminophen (TYLENOL) 325 MG tablet Take 2 tablets (650 mg total) by mouth every 6 (six) hours as needed (or Fever >/= 101).    [name] allopurinol (ZYLOPRIM) 300 MG tablet TAKE 1 TABLET (300 MG TOTAL) BY MOUTH DAILY. 90 tablet 3  . apixaban (ELIQUIS) 5 MG TABS tablet Take 1 tablet (5 mg total) by mouth 2 (two) times daily. Resume 5 days post-op 180 tablet 3  . carvedilol (COREG) 12.5 MG tablet TAKE 1 & 1/2 TABLETS IN THE MORNING AND  1 TABLET IN THE EVENING (Patient taking differently: Take 12.5-18.75 mg by mouth 2 (two) times daily. TAKE 1 & 1/2 TABLETS IN THE MORNING AND 1 TABLET IN THE EVENING  Pt reports he takes 1 tablet in the morning and 1 at night) 225 tablet 3  . magnesium oxide (MAG-OX) 400 MG tablet Take 400 mg by mouth every evening.     . temazepam (RESTORIL) 30 MG capsule TAKE 1 CAPSULE BY MOUTH AT BEDTIME AS NEEDED FOR SLEEP. 30 capsule 1  . oxyCODONE (ROXICODONE) 5 MG immediate release tablet Take 1-2 tablets (5-10 mg total) by mouth every 6 (six) hours as needed. (Patient not taking: Reported on 10/04/2017) 40 tablet 0   No current facility-administered medications on file prior to visit.    No Known Allergies Social History   Socioeconomic History  . Marital status: Married    Spouse name: Not on file  . Number of children: Not on file  . Years of education: Not on file  . Highest education level: Not on file  Occupational History  . Not on file  Social Needs  . Financial resource strain: Not on file  . Food insecurity:    Worry: Not on file    Inability: Not on file  . Transportation needs:    Medical: Not on file    Non-medical: Not on file  Tobacco Use  . Smoking status: Former Smoker    Packs/day: 2.00    Years: 15.00    Pack years: 30.00    Types: Cigarettes    Last attempt to quit: 02/02/1992    Years since quitting: 25.9  . Smokeless tobacco: Never Used  . Tobacco comment: occ alcohol  Substance and Sexual Activity  . Alcohol use: Yes    Alcohol/week: 0.0 standard drinks    Comment: occasional  . Drug use: No  . Sexual activity: Not on file  Lifestyle  . Physical activity:    Days per week: Not on file    Minutes per session: Not on file  . Stress: Not on file  Relationships  . Social connections:    Talks on phone: Not on file    Gets together: Not on file    Attends religious service: Not on file    Active member of club or organization: Not on file    Attends  meetings of clubs or organizations: Not on file    Relationship status: Not on file  . Intimate partner violence:    Fear of current or ex partner: Not on file  Emotionally abused: Not on file    Physically abused: Not on file    Forced sexual activity: Not on file  Other Topics Concern  . Not on file  Social History Narrative  . Not on file    Past Medical History:  Diagnosis Date  . Atrial fibrillation ([place])   . Bronchitis    hx  . Cataract    right eye  . DDD (degenerative disc disease), lumbosacral   . Dysrhythmia    irregular heatbeat  . Eye worm    right eye  . Gout   . History of kidney stones   . Hypertension   . Left knee DJD   . Morbid obesity with BMI of 40.0-44.9, adult ([place])   . Pneumonia    hx  . Prediabetes   . Radicular syndrome of left leg   . S/P total knee replacement    right  . Sleep apnea    can't use sleep study 10 yrs ago  . 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    Past Surgical History:  Procedure Laterality Date  . CARDIOVERSION N/A 03/04/2015   Procedure: CARDIOVERSION;  Surgeon: [name], MD;  Location: [facility];  Service: Cardiovascular;  Laterality: N/A;  . CHOLECYSTECTOMY    . COLONOSCOPY N/A 01/01/2016   Procedure: COLONOSCOPY;  Surgeon: [name], MD;  [facility];  Service: Endoscopy;  Laterality: N/A;  1:45  . COLONOSCOPY N/A 01/14/2017   Procedure: COLONOSCOPY;  Surgeon: [name], MD;  [facility];  Service: Endoscopy;  Laterality: N/A;  12:05  . EYE EXAMINATION UNDER ANESTHESIA W/ RETINAL CRYOTHERAPY AND RETINAL LASER  07/2011  . EYE SURGERY  02/2011   cat rt  . KNEE ARTHROSCOPY     bilateral  . LUMBAR LAMINECTOMY/DECOMPRESSION MICRODISCECTOMY N/A 08/18/2017   Procedure: Bilateral microlumbar decompression Lumbar four-Lumbar five;  Surgeon: [name], MD;  Location: [facility];  Service: Orthopedics;  Laterality: N/A;  . POLYPECTOMY  01/01/2016   Procedure: POLYPECTOMY;  Surgeon: [name], MD;  [facility];  Service: Endoscopy;;  colon  . RETINAL DETACHMENT SURGERY  03/2011   rt  . TOTAL KNEE ARTHROPLASTY  2003   rt  . TOTAL KNEE ARTHROPLASTY  02/07/2012   Procedure: TOTAL KNEE ARTHROPLASTY;  Surgeon: [name], MD;  Location: [facility];  Service: Orthopedics;  Laterality: Left;   Current Outpatient Medications on File Prior to Visit  Medication Sig Dispense Refill  . acetaminophen (TYLENOL) 325 MG tablet Take 2 tablets (650 mg total) by mouth every 6 (six) hours as needed (or Fever >/= 101).    [name] allopurinol (ZYLOPRIM) 300 MG tablet TAKE 1 TABLET (300 MG TOTAL) BY MOUTH DAILY. 90 tablet 3  . apixaban (ELIQUIS) 5 MG TABS tablet Take 1 tablet (5 mg total) by mouth 2 (two) times daily. Resume 5 days post-op 180 tablet 3  . carvedilol (COREG) 12.5 MG tablet TAKE 1 & 1/2 TABLETS IN THE MORNING AND 1 TABLET IN THE EVENING (Patient taking differently: Take 12.5-18.75 mg by mouth 2 (two) times daily. TAKE 1 & 1/2 TABLETS IN THE MORNING AND 1 TABLET IN THE EVENING  Pt reports he takes 1 tablet in the morning and 1 at night) 225 tablet 3  . docusate sodium (COLACE) 100 MG capsule Take 1 capsule (100 mg total) by mouth daily as needed. 40 capsule 1  . ELIQUIS 5 MG TABS tablet TAKE 1 TABLET BY MOUTH TWICE A  DAY 180 tablet 2  . magnesium oxide (MAG-OX) 400 MG tablet Take 400 mg by mouth every evening.     . nortriptyline (PAMELOR) 50 MG capsule TAKE 1 CAPSULE BY MOUTH AT BEDTIME. 30 capsule 1  . oxyCODONE (ROXICODONE) 5 MG immediate release tablet Take 1-2 tablets (5-10 mg total) by mouth every 6 (six) hours as needed. (Patient not taking: Reported on 10/04/2017) 40 tablet 0  . polyethylene glycol (MIRALAX) packet Take 17 g by mouth daily. 14 each 0  . temazepam (RESTORIL) 30 MG capsule TAKE 1 CAPSULE BY MOUTH AT BEDTIME AS NEEDED FOR SLEEP. 30 capsule 1   No current facility-administered medications on file prior to visit.    No Known Allergies Social History    Socioeconomic History  . Marital status: Married    Spouse name: Not on file  . Number of children: Not on file  . Years of education: Not on file  . Highest education level: Not on file  Occupational History  . Not on file  Social Needs  . Financial resource strain: Not on file  . Food insecurity:    Worry: Not on file    Inability: Not on file  . Transportation needs:    Medical: Not on file    Non-medical: Not on file  Tobacco Use  . Smoking status: Former Smoker    Packs/day: 2.00    Years: 15.00    Pack years: 30.00    Types: Cigarettes    Last attempt to quit: 02/02/1992    Years since quitting: 25.9  . Smokeless tobacco: Never Used  . Tobacco comment: occ alcohol  Substance and Sexual Activity  . Alcohol use: Yes    Alcohol/week: 0.0 standard drinks    Comment: occasional  . Drug use: No  . Sexual activity: Not on file  Lifestyle  . Physical activity:    Days per week: Not on file    Minutes per session: Not on file  . Stress: Not on file  Relationships  . Social connections:    Talks on phone: Not on file    Gets together: Not on file    Attends religious service: Not on file    Active member of club or organization: Not on file    Attends meetings of clubs or organizations: Not on file    Relationship status: Not on file  . Intimate partner violence:    Fear of current or ex partner: Not on file    Emotionally abused: Not on file    Physically abused: Not on file    Forced sexual activity: Not on file  Other Topics Concern  . Not on file  Social History Narrative  . Not on file      Review of Systems  All other systems reviewed and are negative.      Objective:   Physical Exam  Constitutional: He appears well-developed and well-nourished.  Cardiovascular: Normal rate. An irregularly irregular rhythm present.  Pulmonary/Chest: Effort normal and breath sounds normal. No stridor. No respiratory distress. He has no wheezes. He has no rales.   Abdominal: Soft. Bowel sounds are normal. He exhibits no distension. There is no tenderness. There is no guarding.  Genitourinary: Rectum normal and prostate normal. Prostate is not enlarged and not tender.  Musculoskeletal:       Right wrist: He exhibits decreased range of motion, tenderness, bony tenderness and swelling.       Arms: Vitals reviewed.  Assessment & Plan:  Right forearm pain - Plan: DG Forearm Right, DG Wrist Complete Right  OAB (overactive bladder)  Insomnia, unspecified type  I believe the patient's right forearm pain is likely due to de Quervain's tenosynovitis.  He has a positive Finkelstein's maneuver and extension of the first MCP joint and radial deviation of his wrist elicits the most pain.  Therefore I will obtain x-rays of the right wrist and right forearm to rule out any underlying skeletal pathology.  If the x-rays are negative, I would recommend cortisone injection for de Quervain's tenosynovitis and also a thumb spica splint to allow the area to rest and heal.  Await the results of the x-ray.  Regarding his nocturia, his prostate exam is normal.  His PSA was normal a few months ago.  I suspect overactive bladder.  Given the fact the majority of his symptoms are at night and is having trouble sleeping, I have recommended trying oxybutynin 5 mg p.o. nightly for both overactive bladder and potentially help with his insomnia.  He can use this with his temazepam which he has become habituated to.  Reassess in 1 to 2 weeks

## 2018-01-20 NOTE — Telephone Encounter (Signed)
Patient aware of results.

## 2018-01-20 NOTE — Telephone Encounter (Signed)
Wants x ray results from today

## 2018-01-23 ENCOUNTER — Ambulatory Visit (INDEPENDENT_AMBULATORY_CARE_PROVIDER_SITE_OTHER): Payer: PPO | Admitting: Family Medicine

## 2018-01-23 ENCOUNTER — Encounter: Payer: Self-pay | Admitting: Family Medicine

## 2018-01-23 VITALS — BP 128/78 | HR 88 | Temp 97.6°F | Resp 18 | Ht 70.5 in | Wt 310.0 lb

## 2018-01-23 DIAGNOSIS — M654 Radial styloid tenosynovitis [de Quervain]: Secondary | ICD-10-CM

## 2018-01-23 NOTE — Progress Notes (Signed)
Subjective:    Patient ID: Garrett Munoz, male    DOB: 04/16/1946, 72 y.o.   MRN: [id number]  HPI  01/20/18 Patient has a long-standing history of chronic insomnia.  He has been on temazepam 30 mg p.o. nightly for years.  He believes he has been taking that medication for probably 15 years.  As a result, he is developed a high tolerance to it and it is no longer working.  He states that he can barely sleep an hour at a time each night and then he is awake.  As a result he feels extremely tired and sleepy throughout the day.  He is also complaining of nocturia.  He states that he has to awaken 4 and 5 times every night to go urinate.  He reports very little urine volume.  However he gets sudden urges almost like spasms that make him have to run to the bathroom particularly at night although also during the daytime.  His PSA in July was completely normal at 0.7.  I performed a prostate exam today and his prostate is minimally swollen for his age without nodularity and without tenderness.  He denies any dysuria or hematuria.  His third issue is pain in his distal right forearm.  He states that this is been there for several months.  He denies any specific fall or injury.  He has pain in the anatomic snuffbox.  He has pain with dorsiflexion of the wrist.  He has severe pain with Finkelstein's maneuver.  In fact this seems to be the worse area for pain is over the distal radius along the tendon sheath.  Also some swelling in that area.  At that time, my plan was: I believe the patient's right forearm pain is likely due to de Quervain's tenosynovitis.  He has a positive Finkelstein's maneuver and extension of the first MCP joint and radial deviation of his wrist elicits the most pain.  Therefore I will obtain x-rays of the right wrist and right forearm to rule out any underlying skeletal pathology.  If the x-rays are negative, I would recommend cortisone injection for de Quervain's tenosynovitis and also a  thumb spica splint to allow the area to rest and heal.  Await the results of the x-ray.  Regarding his nocturia, his prostate exam is normal.  His PSA was normal a few months ago.  I suspect overactive bladder.  Given the fact the majority of his symptoms are at night and is having trouble sleeping, I have recommended trying oxybutynin 5 mg p.o. nightly for both overactive bladder and potentially help with his insomnia.  He can use this with his temazepam which he has become habituated to.  Reassess in 1 to 2 weeks  01/23/18 Xrays of his right wrist and forearm revealed no abnormalities suggesting tendonitis as well.  Here today for cortisone shot.  Continues to endorse pain over the distal radius distal to the radial styloid proximal to the MCP joint.  Palpation in that area elicits pain along the tendon sheath of the EPB and APL.  Finkelstein's maneuver elicits pain.  Resisted extension of the thumb triggers the patient's pain.  Therefore I have recommended a cortisone injection into the tendon sheath for de Quervain's tenosynovitis Past Medical History:  Diagnosis Date  . Atrial fibrillation ([place])   . Bronchitis    hx  . Cataract    right eye  . DDD (degenerative disc disease), lumbosacral   . Dysrhythmia    irregular  heatbeat  . Eye worm    right eye  . Gout   . History of kidney stones   . Hypertension   . Left knee DJD   . Morbid obesity with BMI of 40.0-44.9, adult ([place])   . Pneumonia    hx  . Prediabetes   . Radicular syndrome of left leg   . S/P total knee replacement    right  . Sleep apnea    can't use sleep study 10 yrs ago  . 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    Past Surgical History:  Procedure Laterality Date  . CARDIOVERSION N/A 03/04/2015   Procedure: CARDIOVERSION;  Surgeon: [name], MD;  Location: [place];  Service: Cardiovascular;  Laterality: N/A;  . CHOLECYSTECTOMY    . COLONOSCOPY N/A 01/01/2016   Procedure: COLONOSCOPY;  Surgeon: [name], MD;   [facility];  Service: Endoscopy;  Laterality: N/A;  1:45  . COLONOSCOPY N/A 01/14/2017   Procedure: COLONOSCOPY;  Surgeon: [name], MD;  [facility];  Service: Endoscopy;  Laterality: N/A;  12:05  . EYE EXAMINATION UNDER ANESTHESIA W/ RETINAL CRYOTHERAPY AND RETINAL LASER  07/2011  . EYE SURGERY  02/2011   cat rt  . KNEE ARTHROSCOPY     bilateral  . LUMBAR LAMINECTOMY/DECOMPRESSION MICRODISCECTOMY N/A 08/18/2017   Procedure: Bilateral microlumbar decompression Lumbar four-Lumbar five;  Surgeon: [name], MD;  Location: [place];  Service: Orthopedics;  Laterality: N/A;  . POLYPECTOMY  01/01/2016   Procedure: POLYPECTOMY;  Surgeon: [name], MD;  [facility];  Service: Endoscopy;;  colon  . RETINAL DETACHMENT SURGERY  03/2011   rt  . TOTAL KNEE ARTHROPLASTY  2003   rt  . TOTAL KNEE ARTHROPLASTY  02/07/2012   Procedure: TOTAL KNEE ARTHROPLASTY;  Surgeon: [name], MD;  Location: [place];  Service: Orthopedics;  Laterality: Left;   Current Outpatient Medications on File Prior to Visit  Medication Sig Dispense Refill  . acetaminophen (TYLENOL) 325 MG tablet Take 2 tablets (650 mg total) by mouth every 6 (six) hours as needed (or Fever >/= 101).    [name] allopurinol (ZYLOPRIM) 300 MG tablet TAKE 1 TABLET (300 MG TOTAL) BY MOUTH DAILY. 90 tablet 3  . apixaban (ELIQUIS) 5 MG TABS tablet Take 1 tablet (5 mg total) by mouth 2 (two) times daily. Resume 5 days post-op 180 tablet 3  . carvedilol (COREG) 12.5 MG tablet TAKE 1 & 1/2 TABLETS IN THE MORNING AND 1 TABLET IN THE EVENING (Patient taking differently: Take 12.5-18.75 mg by mouth 2 (two) times daily. TAKE 1 & 1/2 TABLETS IN THE MORNING AND 1 TABLET IN THE EVENING  Pt reports he takes 1 tablet in the morning and 1 at night) 225 tablet 3  . magnesium oxide (MAG-OX) 400 MG tablet Take 400 mg by mouth every evening.     [name] oxybutynin (DITROPAN) 5 MG tablet Take 1 tablet (5 mg total) by mouth at  bedtime as needed for bladder spasms. 30 tablet 1  . oxyCODONE (ROXICODONE) 5 MG immediate release tablet Take 1-2 tablets (5-10 mg total) by mouth every 6 (six) hours as needed. 40 tablet 0  . temazepam (RESTORIL) 30 MG capsule TAKE 1 CAPSULE BY MOUTH AT BEDTIME AS NEEDED FOR SLEEP. 30 capsule 1   No current facility-administered medications on file prior to visit.    No Known Allergies Social History   Socioeconomic History  . Marital status: Married  Spouse name: Not on file  . Number of children: Not on file  . Years of education: Not on file  . Highest education level: Not on file  Occupational History  . Not on file  Social Needs  . Financial resource strain: Not on file  . Food insecurity:    Worry: Not on file    Inability: Not on file  . Transportation needs:    Medical: Not on file    Non-medical: Not on file  Tobacco Use  . Smoking status: Former Smoker    Packs/day: 2.00    Years: 15.00    Pack years: 30.00    Types: Cigarettes    Last attempt to quit: 02/02/1992    Years since quitting: 25.9  . Smokeless tobacco: Never Used  . Tobacco comment: occ alcohol  Substance and Sexual Activity  . Alcohol use: Yes    Alcohol/week: 0.0 standard drinks    Comment: occasional  . Drug use: No  . Sexual activity: Not on file  Lifestyle  . Physical activity:    Days per week: Not on file    Minutes per session: Not on file  . Stress: Not on file  Relationships  . Social connections:    Talks on phone: Not on file    Gets together: Not on file    Attends religious service: Not on file    Active member of club or organization: Not on file    Attends meetings of clubs or organizations: Not on file    Relationship status: Not on file  . Intimate partner violence:    Fear of current or ex partner: Not on file    Emotionally abused: Not on file    Physically abused: Not on file    Forced sexual activity: Not on file  Other Topics Concern  . Not on file  Social  History Narrative  . Not on file    Past Medical History:  Diagnosis Date  . Atrial fibrillation ([place])   . Bronchitis    hx  . Cataract    right eye  . DDD (degenerative disc disease), lumbosacral   . Dysrhythmia    irregular heatbeat  . Eye worm    right eye  . Gout   . History of kidney stones   . Hypertension   . Left knee DJD   . Morbid obesity with BMI of 40.0-44.9, adult ([place])   . Pneumonia    hx  . Prediabetes   . Radicular syndrome of left leg   . S/P total knee replacement    right  . Sleep apnea    can't use sleep study 10 yrs ago  . 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    Past Surgical History:  Procedure Laterality Date  . CARDIOVERSION N/A 03/04/2015   Procedure: CARDIOVERSION;  Surgeon: [name], MD;  Location: [place];  Service: Cardiovascular;  Laterality: N/A;  . CHOLECYSTECTOMY    . COLONOSCOPY N/A 01/01/2016   Procedure: COLONOSCOPY;  Surgeon: [name], MD;  [facility];  Service: Endoscopy;  Laterality: N/A;  1:45  . COLONOSCOPY N/A 01/14/2017   Procedure: COLONOSCOPY;  Surgeon: [name], MD;  [facility];  Service: Endoscopy;  Laterality: N/A;  12:05  . EYE EXAMINATION UNDER ANESTHESIA W/ RETINAL CRYOTHERAPY AND RETINAL LASER  07/2011  . EYE SURGERY  02/2011   cat rt  . KNEE ARTHROSCOPY     bilateral  . LUMBAR LAMINECTOMY/DECOMPRESSION  MICRODISCECTOMY N/A 08/18/2017   Procedure: Bilateral microlumbar decompression Lumbar four-Lumbar five;  Surgeon: [name], MD;  Location: [place];  Service: Orthopedics;  Laterality: N/A;  . POLYPECTOMY  01/01/2016   Procedure: POLYPECTOMY;  Surgeon: [name], MD;  [facility];  Service: Endoscopy;;  colon  . RETINAL DETACHMENT SURGERY  03/2011   rt  . TOTAL KNEE ARTHROPLASTY  2003   rt  . TOTAL KNEE ARTHROPLASTY  02/07/2012   Procedure: TOTAL KNEE ARTHROPLASTY;  Surgeon: [name], MD;  Location: [place];  Service: Orthopedics;  Laterality: Left;     Current Outpatient Medications on File Prior to Visit  Medication Sig Dispense Refill  . acetaminophen (TYLENOL) 325 MG tablet Take 2 tablets (650 mg total) by mouth every 6 (six) hours as needed (or Fever >/= 101).    [name] allopurinol (ZYLOPRIM) 300 MG tablet TAKE 1 TABLET (300 MG TOTAL) BY MOUTH DAILY. 90 tablet 3  . apixaban (ELIQUIS) 5 MG TABS tablet Take 1 tablet (5 mg total) by mouth 2 (two) times daily. Resume 5 days post-op 180 tablet 3  . carvedilol (COREG) 12.5 MG tablet TAKE 1 & 1/2 TABLETS IN THE MORNING AND 1 TABLET IN THE EVENING (Patient taking differently: Take 12.5-18.75 mg by mouth 2 (two) times daily. TAKE 1 & 1/2 TABLETS IN THE MORNING AND 1 TABLET IN THE EVENING  Pt reports he takes 1 tablet in the morning and 1 at night) 225 tablet 3  . magnesium oxide (MAG-OX) 400 MG tablet Take 400 mg by mouth every evening.     [name] oxybutynin (DITROPAN) 5 MG tablet Take 1 tablet (5 mg total) by mouth at bedtime as needed for bladder spasms. 30 tablet 1  . oxyCODONE (ROXICODONE) 5 MG immediate release tablet Take 1-2 tablets (5-10 mg total) by mouth every 6 (six) hours as needed. 40 tablet 0  . temazepam (RESTORIL) 30 MG capsule TAKE 1 CAPSULE BY MOUTH AT BEDTIME AS NEEDED FOR SLEEP. 30 capsule 1   No current facility-administered medications on file prior to visit.    No Known Allergies Social History   Socioeconomic History  . Marital status: Married    Spouse name: Not on file  . Number of children: Not on file  . Years of education: Not on file  . Highest education level: Not on file  Occupational History  . Not on file  Social Needs  . Financial resource strain: Not on file  . Food insecurity:    Worry: Not on file    Inability: Not on file  . Transportation needs:    Medical: Not on file    Non-medical: Not on file  Tobacco Use  . Smoking status: Former Smoker    Packs/day: 2.00    Years: 15.00    Pack years: 30.00    Types: Cigarettes    Last attempt to quit:  02/02/1992    Years since quitting: 25.9  . Smokeless tobacco: Never Used  . Tobacco comment: occ alcohol  Substance and Sexual Activity  . Alcohol use: Yes    Alcohol/week: 0.0 standard drinks    Comment: occasional  . Drug use: No  . Sexual activity: Not on file  Lifestyle  . Physical activity:    Days per week: Not on file    Minutes per session: Not on file  . Stress: Not on file  Relationships  . Social connections:    Talks on phone: Not on file  Gets together: Not on file    Attends religious service: Not on file    Active member of club or organization: Not on file    Attends meetings of clubs or organizations: Not on file    Relationship status: Not on file  . Intimate partner violence:    Fear of current or ex partner: Not on file    Emotionally abused: Not on file    Physically abused: Not on file    Forced sexual activity: Not on file  Other Topics Concern  . Not on file  Social History Narrative  . Not on file      Review of Systems  All other systems reviewed and are negative.      Objective:   Physical Exam  Constitutional: He appears well-developed and well-nourished.  Cardiovascular: Normal rate. An irregularly irregular rhythm present.  Pulmonary/Chest: Effort normal and breath sounds normal. No stridor. No respiratory distress. He has no wheezes. He has no rales.  Abdominal: Soft. Bowel sounds are normal. He exhibits no distension. There is no tenderness. There is no guarding.  Genitourinary: Rectum normal and prostate normal. Prostate is not enlarged and not tender.  Musculoskeletal:       Right wrist: He exhibits decreased range of motion, tenderness, bony tenderness and swelling.       Arms: Vitals reviewed.         Assessment & Plan:  De Quervain's tenosynovitis  Risk and benefits of a cortisone injection were explained to the patient.  The patient was placed with his right wrist over a rolled towel and his hand in slight ulnar  deviation.  The styloid process of the radius was palpated.  The tendon sheath was then isolated by palpation while patient attempted to extend his thumb against resistance.  This area was prepped and draped in sterile fashion with Betadine.  Using sterile technique, a mixture of 1/2 cc of lidocaine and 1/2 cc of 40 mg/mL Kenalog was injected adjacent to the tendon sheath using a 25-gauge 1 inch needle.  Patient tolerated the procedure well without complication.  It was recommended that he rest this area and wear a thumb spica splint for the next 7 to 10 days.  Recheck in 1 week if no better or sooner if worse

## 2018-01-25 ENCOUNTER — Other Ambulatory Visit: Payer: Self-pay

## 2018-01-25 NOTE — Patient Outreach (Signed)
Garrett Munoz [facility]) Care Management  01/25/2018  Garrett Munoz Nov 13, 1945 [id number]   Telephone Screen  Referral Date: 01/25/18 Referral Source: [employer] Concierge Referral Reason: "member states he can not afford his Eliquis-has fallen into coverage gap" Insurance: [employer]   Outreach attempt # 1 to patient. Spoke with spouse(DPR on file)as she voices she handles all of patient's medical affairs. Patient is currently out and about and not in the home at present. Spouse voices that patient is independent with ADLs/IADLs. She denies him having any recent falls. No issues with transportation noted.    Conditions: Per chart review, patient has PMH of chronic insomnia, de Quervain's tenosynovitis, A-fib, right eye cataracts, HTN, prediabetes, obesity, DDD and sleep apnea. Spouse voices that they are knowledgeable regarding medical conditions and does not need any further education or support.    Medications: Spouse reports that patient just got into the donut hole. She reports that patient is taking about four prescription meds. The only med is unable to afford is his Eliquis. She reports that its normally costs him about $80 for a 3 month supply but now it will be around $150-170. She voices that patient went into donut hole around the same time last year and before the end of the year was out he was having to pay almost $500 for his Eliquis and they will not be able to afford this.    Consent: [facility] services reviewed and discussed with spouse. Verbal consent for services provided by spouse. She is agreeable to possible pharmacy med assistance. She denies patient having any other issues or concerns and needing any other [facility] services at this time.   Plan: RN CM will make [facility] pharmacy referral for possible med assistance.    Enzo Montgomery, RN,BSN,CCM [facility] Telephonic Care Management Coordinator Direct Phone: [phone] Toll Free: [phone] Fax:  [phone]

## 2018-01-26 ENCOUNTER — Other Ambulatory Visit: Payer: Self-pay

## 2018-01-26 NOTE — Patient Outreach (Signed)
[facility]) Care Management  [place]  01/26/2018  BRADAN CONGROVE [birth year] [id number]   Reason for referral: medication assistance for Eliquis  Unsuccessful telephone call attempt # 1 to patient.    -HIPAA compliant voicemail left requesting a return call.    Plan:  I will mail patient an unsuccessful outreach letter.    I will make another outreach attempt to patient within 3-4 business days.    Joetta Manners, PharmD Clinical Pharmacist [place] [phone]

## 2018-01-27 ENCOUNTER — Other Ambulatory Visit: Payer: Self-pay

## 2018-01-27 NOTE — Patient Outreach (Signed)
[place] [facility]) Care Management  01/27/2018  Garrett Munoz 1946-04-05 [id number]  Incoming message received from [place] wife.  Unsuccessful return call to Mr. Garrett Munoz.  Left HIPAA compliant voice message requesting a return call.  Plan: Outreach attempt in 3-4 business days.  Joetta Manners, PharmD Clinical Pharmacist [place] [phone]

## 2018-02-01 ENCOUNTER — Ambulatory Visit: Payer: Self-pay

## 2018-02-01 ENCOUNTER — Other Ambulatory Visit: Payer: Self-pay

## 2018-02-01 MED FILL — TEMAZEPAM 30 MG CAPSULE: 30 | 30 days supply | Qty: 30 | Fill #1

## 2018-02-01 NOTE — Patient Outreach (Addendum)
[facility]) Care Management  [place]   02/01/2018  Garrett Munoz [birth year] [id number]  Successful outreach to Garrett Munoz. HIPAA identifiers verified.   Reason for referral:medication assistance for Garrett Munoz Referral source: Garrett Munoz concierge Current insurance:Garrett Munoz Currently receiving Extra Help:  []  Yes [x]  No []  Unknown  PMHx:  persistent atrial fibrillation, hypertension and gout   HPI:  Garrett Munoz reports that he is in the donut hole and can't afford three months of Garrett Munoz for $454.  His wife states that he usually does not go into the donut hole until December, but went in earlier this year due to medications he had to take after a surgery.  Garrett Munoz states that he is going to stop taking Garrett Munoz because "they aint gonna rob me."  Objective: Lab Results  Component Value Date   CREATININE 0.99 10/04/2017   CREATININE 1.08 08/19/2017   CREATININE 0.88 08/10/2017    Lab Results  Component Value Date   HGBA1C 5.4 07/23/2016    Lipid Panel     Component Value Date/Time   CHOL 138 10/04/2017 0844   CHOL 147 12/05/2015 0901   TRIG 73 10/04/2017 0844   HDL 53 10/04/2017 0844   HDL 55 12/05/2015 0901   CHOLHDL 2.6 10/04/2017 0844   VLDL 21 07/23/2016 0805   LDLCALC 70 10/04/2017 0844    BP Readings from Last 3 Encounters:  01/23/18 128/78  01/20/18 134/86  10/04/17 132/84    No Known Allergies  Medications Reviewed Today    Reviewed by Garrett Munoz, Munoz (Registered Medical Assistant) on 01/23/18 at [place] List Status: <None>  Medication Order Taking? Sig Documenting Provider Last Dose Status Informant  acetaminophen (TYLENOL) 325 MG tablet [id number] Yes Take 2 tablets (650 mg total) by mouth every 6 (six) hours as needed (or Fever >/= 101). Shepperson, Kirstin, PA-C Taking Active Self  allopurinol (ZYLOPRIM) 300 MG tablet [id number] Yes TAKE 1 TABLET (300 MG TOTAL) BY MOUTH DAILY. Garrett Frizzle, MD Taking Active   apixaban  (Garrett Munoz) 5 MG TABS tablet [id number] Yes Take 1 tablet (5 mg total) by mouth 2 (two) times daily. Resume 5 days post-op Garrett Kicks, PA-C Taking Active   carvedilol (COREG) 12.5 MG tablet [id number] Yes TAKE 1 & 1/2 TABLETS IN THE MORNING AND 1 TABLET IN THE EVENING  Patient taking differently:  Take 12.5-18.75 mg by mouth 2 (two) times daily. TAKE 1 & 1/2 TABLETS IN THE MORNING AND 1 TABLET IN THE EVENING  Pt reports he takes 1 tablet in the morning and 1 at night   Garrett Sine, MD Taking Active Self  magnesium oxide (MAG-OX) 400 MG tablet [id number] Yes Take 400 mg by mouth every evening.  [provider] Taking Active Self  oxybutynin (DITROPAN) 5 MG tablet [id number] Yes Take 1 tablet (5 mg total) by mouth at bedtime as needed for bladder spasms. Garrett Frizzle, MD Taking Active   oxyCODONE (ROXICODONE) 5 MG immediate release tablet [id number] Yes Take 1-2 tablets (5-10 mg total) by mouth every 6 (six) hours as needed. Garrett Munoz Taking Active   temazepam (RESTORIL) 30 MG capsule [id number] Yes TAKE 1 CAPSULE BY MOUTH AT BEDTIME AS NEEDED FOR SLEEP. Garrett Frizzle, MD Taking Active           Assessment:  Drugs sorted by system:  Neurologic/Psychologic: temazepam  Cardiovascular: apixaban, carvedilol  Gastrointestinal: oxybutynin  Pain: acetaminophen  Vitamins/Minerals/Supplements: magnesium oxide  Miscellaneous: allopurinol  Medication Review Findings:  . Carvedilol is being taken differently than prescribed.  He states he takes 1 tablet daily.  When questioned about the different directions, he states, "He may have prescribed it that way, but I'Munoz taking it the way I want to take it."  Medication Assistance Findings:  Extra Help:   []  Already receiving Full Extra Help  []  Already receiving Partial Extra Help  []  Eligible based on reported income and assets  [x]  Not Eligible based on reported income and assets  Per [employer], [name] has a true out of pocket expenditure of $282.92.  He has not spent 3% of income to qualify for Garrett Munoz patient assistance.  Reviewed with patient and his wife the importance of continuing anticoagulation to prevent stoke from atrial fibrillation.  Wife said she will review other options (warfarin) or samples when he sees his doctor in November.  Patient has been on warfarin in the past after a knee surgery.  Wife declined my services to call doctor and request samples.  She said she would discuss it next month during his appointment.  Plan: Route case closure letter to cardiologist, Dr. Shelva Munoz.  Garrett Munoz, PharmD Clinical Pharmacist [place] [phone]

## 2018-02-07 ENCOUNTER — Telehealth: Payer: Self-pay | Admitting: Family Medicine

## 2018-02-07 MED FILL — AMOXICILLIN 500 MG CAPSULE: 500 | 3 days supply | Qty: 12 | Fill #1

## 2018-02-07 NOTE — Telephone Encounter (Signed)
Per lov if no better needs ov - pt called and appt made

## 2018-02-07 NOTE — Telephone Encounter (Signed)
Pt's wife called [facility] stating that pt is no better after the injection in his hand/wrist and would like to know what would the next step be for his pain?

## 2018-02-09 ENCOUNTER — Ambulatory Visit (INDEPENDENT_AMBULATORY_CARE_PROVIDER_SITE_OTHER): Payer: PPO | Admitting: Family Medicine

## 2018-02-09 ENCOUNTER — Encounter: Payer: Self-pay | Admitting: Family Medicine

## 2018-02-09 VITALS — BP 130/82 | HR 66 | Temp 98.7°F | Resp 20 | Ht 70.5 in | Wt 314.0 lb

## 2018-02-09 DIAGNOSIS — H6982 Other specified disorders of Eustachian tube, left ear: Secondary | ICD-10-CM | POA: Insufficient documentation

## 2018-02-09 DIAGNOSIS — H903 Sensorineural hearing loss, bilateral: Secondary | ICD-10-CM | POA: Insufficient documentation

## 2018-02-09 DIAGNOSIS — H9313 Tinnitus, bilateral: Secondary | ICD-10-CM | POA: Insufficient documentation

## 2018-02-09 DIAGNOSIS — G4700 Insomnia, unspecified: Secondary | ICD-10-CM | POA: Diagnosis not present

## 2018-02-09 DIAGNOSIS — M654 Radial styloid tenosynovitis [de Quervain]: Secondary | ICD-10-CM

## 2018-02-09 DIAGNOSIS — H6992 Unspecified Eustachian tube disorder, left ear: Secondary | ICD-10-CM | POA: Insufficient documentation

## 2018-02-09 MED ORDER — PREDNISONE 20 MG PO TABS
ORAL_TABLET | ORAL | 0 refills | Status: DC
Start: 2018-02-09 — End: 2018-03-06

## 2018-02-09 MED ORDER — DIAZEPAM 10 MG PO TABS: 10.0000 mg | ORAL_TABLET | Freq: Every evening | ORAL | 1 refills | Status: DC | PRN

## 2018-02-09 MED FILL — diazePAM 10 MG TABS: 10 | 30 days supply | Qty: 30 | Fill #0

## 2018-02-09 MED FILL — predniSONE 20 MG TABS: 20 | 6 days supply | Qty: 12 | Fill #0

## 2018-02-09 NOTE — Progress Notes (Signed)
Subjective:    Patient ID: Garrett Munoz, male    DOB: 06-30-1945, 72 y.o.   MRN: [id number]  HPI  01/20/18 Patient has a long-standing history of chronic insomnia.  He has been on temazepam 30 mg p.o. nightly for years.  He believes he has been taking that medication for probably 15 years.  As a result, he is developed a high tolerance to it and it is no longer working.  He states that he can barely sleep an hour at a time each night and then he is awake.  As a result he feels extremely tired and sleepy throughout the day.  He is also complaining of nocturia.  He states that he has to awaken 4 and 5 times every night to go urinate.  He reports very little urine volume.  However he gets sudden urges almost like spasms that make him have to run to the bathroom particularly at night although also during the daytime.  His PSA in July was completely normal at 0.7.  I performed a prostate exam today and his prostate is minimally swollen for his age without nodularity and without tenderness.  He denies any dysuria or hematuria.  His third issue is pain in his distal right forearm.  He states that this is been there for several months.  He denies any specific fall or injury.  He has pain in the anatomic snuffbox.  He has pain with dorsiflexion of the wrist.  He has severe pain with Finkelstein's maneuver.  In fact this seems to be the worse area for pain is over the distal radius along the tendon sheath.  Also some swelling in that area.  At that time, my plan was: I believe the patient's right forearm pain is likely due to de Quervain's tenosynovitis.  He has a positive Finkelstein's maneuver and extension of the first MCP joint and radial deviation of his wrist elicits the most pain.  Therefore I will obtain x-rays of the right wrist and right forearm to rule out any underlying skeletal pathology.  If the x-rays are negative, I would recommend cortisone injection for de Quervain's tenosynovitis and also a  thumb spica splint to allow the area to rest and heal.  Await the results of the x-ray.  Regarding his nocturia, his prostate exam is normal.  His PSA was normal a few months ago.  I suspect overactive bladder.  Given the fact the majority of his symptoms are at night and is having trouble sleeping, I have recommended trying oxybutynin 5 mg p.o. nightly for both overactive bladder and potentially help with his insomnia.  He can use this with his temazepam which he has become habituated to.  Reassess in 1 to 2 weeks  01/23/18 Xrays of his right wrist and forearm revealed no abnormalities suggesting tendonitis as well.  Here today for cortisone shot.  Continues to endorse pain over the distal radius distal to the radial styloid proximal to the MCP joint.  Palpation in that area elicits pain along the tendon sheath of the EPB and APL.  Finkelstein's maneuver elicits pain.  Resisted extension of the thumb triggers the patient's pain.  Therefore I have recommended a cortisone injection into the tendon sheath for de Quervain's tenosynovitis.  At that time, my plan was: Risk and benefits of a cortisone injection were explained to the patient.  The patient was placed with his right wrist over a rolled towel and his hand in slight ulnar deviation.  The styloid  process of the radius was palpated.  The tendon sheath was then isolated by palpation while patient attempted to extend his thumb against resistance.  This area was prepped and draped in sterile fashion with Betadine.  Using sterile technique, a mixture of 1/2 cc of lidocaine and 1/2 cc of 40 mg/mL Kenalog was injected adjacent to the tendon sheath using a 25-gauge 1 inch needle.  Patient tolerated the procedure well without complication.  It was recommended that he rest this area and wear a thumb spica splint for the next 7 to 10 days.  Recheck in 1 week if no better or sooner if worse  02/09/18 Patient saw no improvement after the cortisone injection that we  performed.  He did notice some bruising where the injection was performed but has seen no benefit in the pain.  Again the pain is located just above the radial styloid process.  The pain is made worse by extending his thumb.  He is tender to palpation along the extensor tendon.  He has a positive Finkelstein maneuver.  Palpation of the radial styloid process without extension of the thumb causes no pain.  He has no pain in the anatomic snuffbox.  He has no pain with flexion or extension of his wrist or supination or pronation.  He also continues to report insomnia unrelieved by temazepam 30 mg at night.  This is a chronic long-standing problem for this patient Past Medical History:  Diagnosis Date  . Atrial fibrillation ([place])   . Bronchitis    hx  . Cataract    right eye  . DDD (degenerative disc disease), lumbosacral   . Dysrhythmia    irregular heatbeat  . Eye worm    right eye  . Gout   . History of kidney stones   . Hypertension   . Left knee DJD   . Morbid obesity with BMI of 40.0-44.9, adult ([place])   . Pneumonia    hx  . Prediabetes   . Radicular syndrome of left leg   . S/P total knee replacement    right  . Sleep apnea    can't use sleep study 10 yrs ago  . 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    Past Surgical History:  Procedure Laterality Date  . CARDIOVERSION N/A 03/04/2015   Procedure: CARDIOVERSION;  Surgeon: [name], MD;  Location: [facility];  Service: Cardiovascular;  Laterality: N/A;  . CHOLECYSTECTOMY    . COLONOSCOPY N/A 01/01/2016   Procedure: COLONOSCOPY;  Surgeon: [name], MD;  [facility];  Service: Endoscopy;  Laterality: N/A;  1:45  . COLONOSCOPY N/A 01/14/2017   Procedure: COLONOSCOPY;  Surgeon: [name], MD;  [facility];  Service: Endoscopy;  Laterality: N/A;  12:05  . EYE EXAMINATION UNDER ANESTHESIA W/ RETINAL CRYOTHERAPY AND RETINAL LASER  07/2011  . EYE SURGERY  02/2011   cat rt  . KNEE ARTHROSCOPY      bilateral  . LUMBAR LAMINECTOMY/DECOMPRESSION MICRODISCECTOMY N/A 08/18/2017   Procedure: Bilateral microlumbar decompression Lumbar four-Lumbar five;  Surgeon: [name], MD;  Location: [place];  Service: Orthopedics;  Laterality: N/A;  . POLYPECTOMY  01/01/2016   Procedure: POLYPECTOMY;  Surgeon: [name], MD;  [facility];  Service: Endoscopy;;  colon  . RETINAL DETACHMENT SURGERY  03/2011   rt  . TOTAL KNEE ARTHROPLASTY  2003   rt  . TOTAL KNEE ARTHROPLASTY  02/07/2012   Procedure: TOTAL KNEE ARTHROPLASTY;  Surgeon:  [name], MD;  Location: [facility];  Service: Orthopedics;  Laterality: Left;   Current Outpatient Medications on File Prior to Visit  Medication Sig Dispense Refill  . acetaminophen (TYLENOL) 325 MG tablet Take 2 tablets (650 mg total) by mouth every 6 (six) hours as needed (or Fever >/= 101).    [name] allopurinol (ZYLOPRIM) 300 MG tablet TAKE 1 TABLET (300 MG TOTAL) BY MOUTH DAILY. 90 tablet 3  . apixaban (ELIQUIS) 5 MG TABS tablet Take 1 tablet (5 mg total) by mouth 2 (two) times daily. Resume 5 days post-op 180 tablet 3  . carvedilol (COREG) 12.5 MG tablet TAKE 1 & 1/2 TABLETS IN THE MORNING AND 1 TABLET IN THE EVENING (Patient taking differently: Take 12.5-18.75 mg by mouth 2 (two) times daily. Pt reports he takes 1 tablet in the morning and 1 at night) 225 tablet 3  . magnesium oxide (MAG-OX) 400 MG tablet Take 400 mg by mouth every evening.     [name] oxybutynin (DITROPAN) 5 MG tablet Take 1 tablet (5 mg total) by mouth at bedtime as needed for bladder spasms. 30 tablet 1  . temazepam (RESTORIL) 30 MG capsule TAKE 1 CAPSULE BY MOUTH AT BEDTIME AS NEEDED FOR SLEEP. 30 capsule 1   No current facility-administered medications on file prior to visit.    No Known Allergies Social History   Socioeconomic History  . Marital status: Married    Spouse name: Not on file  . Number of children: Not on file  . Years of education: Not on file  . Highest education  level: Not on file  Occupational History  . Not on file  Social Needs  . Financial resource strain: Not on file  . Food insecurity:    Worry: Not on file    Inability: Not on file  . Transportation needs:    Medical: Not on file    Non-medical: Not on file  Tobacco Use  . Smoking status: Former Smoker    Packs/day: 2.00    Years: 15.00    Pack years: 30.00    Types: Cigarettes    Last attempt to quit: 02/02/1992    Years since quitting: 26.0  . Smokeless tobacco: Never Used  . Tobacco comment: occ alcohol  Substance and Sexual Activity  . Alcohol use: Yes    Alcohol/week: 0.0 standard drinks    Comment: occasional  . Drug use: No  . Sexual activity: Not on file  Lifestyle  . Physical activity:    Days per week: Not on file    Minutes per session: Not on file  . Stress: Not on file  Relationships  . Social connections:    Talks on phone: Not on file    Gets together: Not on file    Attends religious service: Not on file    Active member of club or organization: Not on file    Attends meetings of clubs or organizations: Not on file    Relationship status: Not on file  . Intimate partner violence:    Fear of current or ex partner: Not on file    Emotionally abused: Not on file    Physically abused: Not on file    Forced sexual activity: Not on file  Other Topics Concern  . Not on file  Social History Narrative  . Not on file    Past Medical History:  Diagnosis Date  . Atrial fibrillation ([place])   . Bronchitis    hx  .  Cataract    right eye  . DDD (degenerative disc disease), lumbosacral   . Dysrhythmia    irregular heatbeat  . Eye worm    right eye  . Gout   . History of kidney stones   . Hypertension   . Left knee DJD   . Morbid obesity with BMI of 40.0-44.9, adult ([place])   . Pneumonia    hx  . Prediabetes   . Radicular syndrome of left leg   . S/P total knee replacement    right  . Sleep apnea    can't use sleep study 10 yrs ago  . Stones in  the urinary tract    Past Surgical History:  Procedure Laterality Date  . CARDIOVERSION N/A 03/04/2015   Procedure: CARDIOVERSION;  Surgeon: [name], MD;  Location: [facility];  Service: Cardiovascular;  Laterality: N/A;  . CHOLECYSTECTOMY    . COLONOSCOPY N/A 01/01/2016   Procedure: COLONOSCOPY;  Surgeon: [name], MD;  [facility];  Service: Endoscopy;  Laterality: N/A;  1:45  . COLONOSCOPY N/A 01/14/2017   Procedure: COLONOSCOPY;  Surgeon: [name], MD;  [facility];  Service: Endoscopy;  Laterality: N/A;  12:05  . EYE EXAMINATION UNDER ANESTHESIA W/ RETINAL CRYOTHERAPY AND RETINAL LASER  07/2011  . EYE SURGERY  02/2011   cat rt  . KNEE ARTHROSCOPY     bilateral  . LUMBAR LAMINECTOMY/DECOMPRESSION MICRODISCECTOMY N/A 08/18/2017   Procedure: Bilateral microlumbar decompression Lumbar four-Lumbar five;  Surgeon: [name], MD;  Location: [facility];  Service: Orthopedics;  Laterality: N/A;  . POLYPECTOMY  01/01/2016   Procedure: POLYPECTOMY;  Surgeon: [name], MD;  [facility];  Service: Endoscopy;;  colon  . RETINAL DETACHMENT SURGERY  03/2011   rt  . TOTAL KNEE ARTHROPLASTY  2003   rt  . TOTAL KNEE ARTHROPLASTY  02/07/2012   Procedure: TOTAL KNEE ARTHROPLASTY;  Surgeon: [name], MD;  Location: [place];  Service: Orthopedics;  Laterality: Left;   Current Outpatient Medications on File Prior to Visit  Medication Sig Dispense Refill  . acetaminophen (TYLENOL) 325 MG tablet Take 2 tablets (650 mg total) by mouth every 6 (six) hours as needed (or Fever >/= 101).    [name] allopurinol (ZYLOPRIM) 300 MG tablet TAKE 1 TABLET (300 MG TOTAL) BY MOUTH DAILY. 90 tablet 3  . apixaban (ELIQUIS) 5 MG TABS tablet Take 1 tablet (5 mg total) by mouth 2 (two) times daily. Resume 5 days post-op 180 tablet 3  . carvedilol (COREG) 12.5 MG tablet TAKE 1 & 1/2 TABLETS IN THE MORNING AND 1 TABLET IN THE EVENING (Patient taking differently: Take  12.5-18.75 mg by mouth 2 (two) times daily. Pt reports he takes 1 tablet in the morning and 1 at night) 225 tablet 3  . magnesium oxide (MAG-OX) 400 MG tablet Take 400 mg by mouth every evening.     [name] oxybutynin (DITROPAN) 5 MG tablet Take 1 tablet (5 mg total) by mouth at bedtime as needed for bladder spasms. 30 tablet 1  . temazepam (RESTORIL) 30 MG capsule TAKE 1 CAPSULE BY MOUTH AT BEDTIME AS NEEDED FOR SLEEP. 30 capsule 1   No current facility-administered medications on file prior to visit.    No Known Allergies Social History   Socioeconomic History  . Marital status: Married    Spouse name: Not on file  . Number of children: Not on file  . Years of education: Not  on file  . Highest education level: Not on file  Occupational History  . Not on file  Social Needs  . Financial resource strain: Not on file  . Food insecurity:    Worry: Not on file    Inability: Not on file  . Transportation needs:    Medical: Not on file    Non-medical: Not on file  Tobacco Use  . Smoking status: Former Smoker    Packs/day: 2.00    Years: 15.00    Pack years: 30.00    Types: Cigarettes    Last attempt to quit: 02/02/1992    Years since quitting: 26.0  . Smokeless tobacco: Never Used  . Tobacco comment: occ alcohol  Substance and Sexual Activity  . Alcohol use: Yes    Alcohol/week: 0.0 standard drinks    Comment: occasional  . Drug use: No  . Sexual activity: Not on file  Lifestyle  . Physical activity:    Days per week: Not on file    Minutes per session: Not on file  . Stress: Not on file  Relationships  . Social connections:    Talks on phone: Not on file    Gets together: Not on file    Attends religious service: Not on file    Active member of club or organization: Not on file    Attends meetings of clubs or organizations: Not on file    Relationship status: Not on file  . Intimate partner violence:    Fear of current or ex partner: Not on file    Emotionally abused:  Not on file    Physically abused: Not on file    Forced sexual activity: Not on file  Other Topics Concern  . Not on file  Social History Narrative  . Not on file      Review of Systems  All other systems reviewed and are negative.      Objective:   Physical Exam  Constitutional: He appears well-developed and well-nourished.  Cardiovascular: Normal rate. An irregularly irregular rhythm present.  Pulmonary/Chest: Effort normal and breath sounds normal. No stridor. No respiratory distress. He has no wheezes. He has no rales.  Abdominal: Soft. Bowel sounds are normal. He exhibits no distension. There is no tenderness. There is no guarding.  Genitourinary: Rectum normal and prostate normal. Prostate is not enlarged and not tender.  Musculoskeletal:       Right wrist: He exhibits decreased range of motion, tenderness, bony tenderness and swelling.  Vitals reviewed.     Assessment & Plan:  De Quervain's tenosynovitis, right - Plan: predniSONE (DELTASONE) 20 MG tablet, Ambulatory referral to Orthopedic Surgery  Insomnia, unspecified type  I still believe the diagnosis to be correct.  X-rays have been negative.  Perhaps I did not place the injection in the appropriate location.  Therefore I recommended the patient continue to wear his thumb spica splint.  We will add a prednisone taper pack in an effort to calm down the tendinitis.  I will consult repeating surgery for a second opinion and perhaps an ultrasound-guided cortisone injection if deemed appropriate.  Regarding his insomnia, discontinue temazepam and replace with Valium 10 mg p.o. Nightly.

## 2018-02-13 MED FILL — SHINGRIX 50 MCG SUS: 50 | 1 days supply | Qty: 1 | Fill #1

## 2018-02-14 MED FILL — OXYBUTYNIN 5 MG TABLET: 5 | 30 days supply | Qty: 30 | Fill #1

## 2018-02-28 ENCOUNTER — Telehealth: Payer: Self-pay | Admitting: Family Medicine

## 2018-02-28 MED ORDER — TEMAZEPAM 30 MG PO CAPS: ORAL_CAPSULE | ORAL | 1 refills | Status: DC

## 2018-02-28 NOTE — Telephone Encounter (Signed)
Pt brought note by office stating that the valium sis not work so he stopped taking it. Went back to Temazepam. Oxybutynin does help bladder spasms. Requesting a refill on Temazepam.  Requesting refill      LOV: 02/09/18  LRF:  01/05/18

## 2018-03-06 ENCOUNTER — Encounter: Payer: Self-pay | Admitting: Cardiovascular Disease

## 2018-03-06 ENCOUNTER — Ambulatory Visit: Payer: PPO | Admitting: Cardiovascular Disease

## 2018-03-06 VITALS — BP 142/92 | HR 91 | Resp 16 | Ht 71.0 in | Wt 320.0 lb

## 2018-03-06 DIAGNOSIS — I4821 Permanent atrial fibrillation: Secondary | ICD-10-CM | POA: Diagnosis not present

## 2018-03-06 DIAGNOSIS — I10 Essential (primary) hypertension: Secondary | ICD-10-CM

## 2018-03-06 DIAGNOSIS — Z7901 Long term (current) use of anticoagulants: Secondary | ICD-10-CM

## 2018-03-06 DIAGNOSIS — G4733 Obstructive sleep apnea (adult) (pediatric): Secondary | ICD-10-CM

## 2018-03-06 MED FILL — TEMAZEPAM 30 MG CAPSULE: 30 | 30 days supply | Qty: 30 | Fill #0

## 2018-03-06 NOTE — Patient Instructions (Signed)
Medication Instructions:  Your physician recommends that you continue on your current medications as directed. Please refer to the Current Medication list given to you today.  If you need a refill on your cardiac medications before your next appointment, please call your pharmacy.   Testing/Procedures: Your physician has recommended that you have a sleep study. This test records several body functions during sleep, including: brain activity, eye movement, oxygen and carbon dioxide blood levels, heart rate and rhythm, breathing rate and rhythm, the flow of air through your mouth and nose, snoring, body muscle movements, and chest and belly movement. ---please let us know if you wish to persue this study  Follow-Up: At [facility], you and your health needs are our priority.  As part of our continuing mission to provide you with exceptional heart care, we have created designated Provider Care Teams.  These Care Teams include your primary Cardiologist (physician) and Advanced Practice Providers (APPs -  Physician Assistants and Nurse Practitioners) who all work together to provide you with the care you need, when you need it. You will need a follow up appointment in 6 months.  Please call our office 2 months in advance to schedule this appointment.  You may see Shelva Majestic, MD or one of the following Advanced Practice Providers on your designated Care Team: [place], [place] . Fabian Sharp, PA-C

## 2018-03-06 NOTE — Progress Notes (Signed)
Patient ID: MILLAN LEGAN, male   DOB: [birth year], 72 y.o.   MRN: [id number]     HPI: Garrett Munoz is a 72 y.o. male who presents to the office today for a 13 month follow-up evaluation.  Garrett Munoz has a history of hypertension, and remotely had taken blood pressure medications for several years but none since the last 20 years. He has a history of prior hypertension, obstructive sleep apnea, untreated due to previous intolerance to full face mask, as well as obesity.  He admits to having an intermittent irregular rhythm.  Last autumn he was scheduled to undergo elective surgery on his right foot hammertoe by Dr. Fritzi Mandes.  Surgery was canceled due to concerns for possible Mobitz type II block with possible atrial flutter.  He was seen by me for preoperative evaluation on 01/30/2015.  At that time, his ECG demonstrated atrial fibrillation with ventricular rate at approximately 100 bpm.  He was noted have small Q wave in lead 3.  He was started on Toprol 25 mg for 4 days and this was titrated up to 50 mg.  He also was started on eloquence 5 mg twice a day for anticoagulation.  A 2-D echo Doppler study on February 18 2015 revealed an ejection fraction of 55-60%.  There was moderate left ventricular hypertrophy.  The left atrium was moderately dilated.  A nuclear perfusion study done on 02/13/2015 was low risk without ischemia with only a mild apical defect, probably attenuation. He denies any chest pain.  He denies shortness of breath.  He is been unable to walk secondary to his toe abnormality.  He admits to daytime sleepiness and snoring.  He had not utilized CPAP therapy in years.  He is status post cataract surgery with lens implant.    He underwent cardioversion on March 04, 2015 and was successfully converted back to sinus rhythm.  On 04/10/2015 he was admitted with community-acquired pneumonia and was found to be back in atrial fibrillation.  He denies any episodes of chest pain. He has been on  eliquis 5 mg twice a day for anticoagulation , carvedilol 6.25 mg twice a day.  He is unaware of his rhythm being abnormal. He is not sleeping well but has not been using his CPAP. He feels fatigued.  When I saw him in March 2017 he continued to be in atrial fibrillation.  After much discussion, concerning another attempt at trying to convert into sinus rhythm versus staying in permanent atrial fibrillation he opted to stay in permanent atrial fibrillation.  He has continued to be on anticoagulation therapy.  I last saw him in October 2018 he continued to have difficulty with low back pain as well as swelling in his hands due to arthritis.  He sees Dr. Dennard Schaumann for primary care.  He denied any recent episodes of chest pain.  He denies palpitations.  He has not been successful with weight loss and his BMI has increased up to 42.3.  Laboratory in April 2018 had shown glucose increased at 107.  Lipid studies revealed a cholesterol of 134, triglycerides 103, HDL 50, and LDL 63.    Since I last saw him he has not been using his CPAP therapy.  He is not sleeping well and often has to take naps.  At times he is waking up most hourly.  He does snore.  He has continued to be on Eliquis and denies bleeding.  He is on carvedilol 18.75 mg in the morning and 12.5  mg in the evening.  He has permanent atrial fibrillation.  He presents for evaluation.  Past Medical History:  Diagnosis Date  . Atrial fibrillation ([place])   . Bronchitis    hx  . Cataract    right eye  . DDD (degenerative disc disease), lumbosacral   . Dysrhythmia    irregular heatbeat  . Eye worm    right eye  . Gout   . History of kidney stones   . Hypertension   . Left knee DJD   . Morbid obesity with BMI of 40.0-44.9, adult ([place])   . Pneumonia    hx  . Prediabetes   . Radicular syndrome of left leg   . S/P total knee replacement    right  . Sleep apnea    can't use sleep study 10 yrs ago  . 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     Past  Surgical History:  Procedure Laterality Date  . CARDIOVERSION N/A 03/04/2015   Procedure: CARDIOVERSION;  Surgeon: [name], MD;  Location: [place];  Service: Cardiovascular;  Laterality: N/A;  . CHOLECYSTECTOMY    . COLONOSCOPY N/A 01/01/2016   Procedure: COLONOSCOPY;  Surgeon: [name], MD;  [facility];  Service: Endoscopy;  Laterality: N/A;  1:45  . COLONOSCOPY N/A 01/14/2017   Procedure: COLONOSCOPY;  Surgeon: [name], MD;  [facility];  Service: Endoscopy;  Laterality: N/A;  12:05  . EYE EXAMINATION UNDER ANESTHESIA W/ RETINAL CRYOTHERAPY AND RETINAL LASER  07/2011  . EYE SURGERY  02/2011   cat rt  . KNEE ARTHROSCOPY     bilateral  . LUMBAR LAMINECTOMY/DECOMPRESSION MICRODISCECTOMY N/A 08/18/2017   Procedure: Bilateral microlumbar decompression Lumbar four-Lumbar five;  Surgeon: [name], MD;  Location: [place];  Service: Orthopedics;  Laterality: N/A;  . POLYPECTOMY  01/01/2016   Procedure: POLYPECTOMY;  Surgeon: [name], MD;  [facility];  Service: Endoscopy;;  colon  . RETINAL DETACHMENT SURGERY  03/2011   rt  . TOTAL KNEE ARTHROPLASTY  2003   rt  . TOTAL KNEE ARTHROPLASTY  02/07/2012   Procedure: TOTAL KNEE ARTHROPLASTY;  Surgeon: [name], MD;  Location: [place];  Service: Orthopedics;  Laterality: Left;    No Known Allergies  Current Outpatient Medications  Medication Sig Dispense Refill  . acetaminophen (TYLENOL) 325 MG tablet Take 2 tablets (650 mg total) by mouth every 6 (six) hours as needed (or Fever >/= 101).    [name] allopurinol (ZYLOPRIM) 300 MG tablet TAKE 1 TABLET (300 MG TOTAL) BY MOUTH DAILY. 90 tablet 3  . apixaban (ELIQUIS) 5 MG TABS tablet Take 1 tablet (5 mg total) by mouth 2 (two) times daily. Resume 5 days post-op 180 tablet 3  . carvedilol (COREG) 12.5 MG tablet TAKE 1 & 1/2 TABLETS IN THE MORNING AND 1 TABLET IN THE EVENING (Patient taking differently: Take 12.5-18.75 mg by mouth 2  (two) times daily. Pt reports he takes 1 tablet in the morning and 1 at night) 225 tablet 3  . magnesium oxide (MAG-OX) 400 MG tablet Take 400 mg by mouth every evening.     . temazepam (RESTORIL) 30 MG capsule TAKE 1 CAPSULE BY MOUTH AT BEDTIME AS NEEDED FOR SLEEP. 30 capsule 1   No current facility-administered medications for this visit.     Social History   Socioeconomic History  . Marital status: Married    Spouse name: Not on file  . Number of children:  Not on file  . Years of education: Not on file  . Highest education level: Not on file  Occupational History  . Not on file  Social Needs  . Financial resource strain: Not on file  . Food insecurity:    Worry: Not on file    Inability: Not on file  . Transportation needs:    Medical: Not on file    Non-medical: Not on file  Tobacco Use  . Smoking status: Former Smoker    Packs/day: 2.00    Years: 15.00    Pack years: 30.00    Types: Cigarettes    Last attempt to quit: 02/02/1992    Years since quitting: 26.1  . Smokeless tobacco: Never Used  . Tobacco comment: occ alcohol  Substance and Sexual Activity  . Alcohol use: Yes    Alcohol/week: 0.0 standard drinks    Comment: occasional  . Drug use: No  . Sexual activity: Not on file  Lifestyle  . Physical activity:    Days per week: Not on file    Minutes per session: Not on file  . Stress: Not on file  Relationships  . Social connections:    Talks on phone: Not on file    Gets together: Not on file    Attends religious service: Not on file    Active member of club or organization: Not on file    Attends meetings of clubs or organizations: Not on file    Relationship status: Not on file  . Intimate partner violence:    Fear of current or ex partner: Not on file    Emotionally abused: Not on file    Physically abused: Not on file    Forced sexual activity: Not on file  Other Topics Concern  . Not on file  Social History Narrative  . 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  He is married.   Family History  Problem Relation Age of Onset  . Lung cancer Father   . Lung cancer Sister    Family history is notable that his father died at 76 with lung CA and his mother died at 1.  She had an irregular heart rhythm.  He has 2 sisters and one is deceased secondary to cancer and 1 brother.  ROS General: Negative; No fevers, chills, or night sweats.  Positive for morbid obesity. HEENT: He has a right eye lens  implant after cataract surgery No changes in vision or hearing, sinus congestion, difficulty swallowing Pulmonary: Negative; No cough, wheezing, shortness of breath, hemoptysis Cardiovascular: See HPI:  GI: Negative; No nausea, vomiting, diarrhea, or abdominal pain GU: Negative; No dysuria, hematuria, or difficulty voiding Musculoskeletal: History of remote right total knee replacement and left total knee replacement.  Recent low back pain from lumbar spine Hematologic: Negative; no easy bruising, bleeding Endocrine: Negative; no heat/cold intolerance; no diabetes, Neuro: Negative; no changes in balance, headaches Skin: Negative; No rashes or skin lesions Psychiatric: Negative; No behavioral problems, depression Sleep: Positive for untreated sleep apnea; positive for snoring and hypersomnolence. Other comprehensive 14 point system review is negative   Physical Exam BP (!) 142/92   Pulse 91   Resp 16   Ht _0  (1.803 m)   Wt (!) 320 lb (145.2 kg)   SpO2 94%   BMI 44.63 kg/m    Repeat blood pressure by me 110/70  Wt Readings from  Last 3 Encounters:  03/06/18 (!) 320 lb (145.2 kg)  02/09/18 (!) 314 lb (142.4 kg)  01/23/18 (!) 310 lb (140.6 kg)    General: Alert, oriented, no distress.  Morbidly obese Skin: normal turgor, no rashes, warm and dry HEENT: Normocephalic, atraumatic. Pupils equal round and reactive to light; sclera anicteric; extraocular muscles intact;   Nose without nasal septal hypertrophy Mouth/Parynx benign; Mallinpatti scale 3 Neck: No JVD, no carotid bruits; normal carotid upstroke Lungs: clear to ausculatation and percussion; no wheezing or rales Chest wall: without tenderness to palpitation Heart: PMI not displaced, RRR, s1 s2 normal, 1/6 systolic murmur, no diastolic murmur, no rubs, gallops, thrills, or heaves Abdomen: Significant central adiposity; soft, nontender; no hepatosplenomehaly, BS+; abdominal aorta nontender and not dilated by palpation. Back: no CVA tenderness Pulses 2+ Musculoskeletal: Decreased flexibility, normal strength, no joint deformities Extremities: no clubbing cyanosis or edema, Homan's sign negative  Neurologic: grossly nonfocal; Cranial nerves grossly wnl Psychologic: Normal mood and affect   ECG (independently read by me): Atrial fibrillation 81 bpm.  QTc interval 422 ms.  Early transition.  October 2018 ECG (independently read by me): Atrial fibrillation with ventricular rate at 79 bpm.  March 2018 ECG (independently read by me): Atrial fibrillation at 72 bpm with PVC.  September 2017 ECG (independently read by me): Atrial fibrillation with ventricular rate at 10 2 bpm.  Occasional unifocal PVCs versus a bare complex.  March 2017 ECG (independently read by me):  Atrial fibrillation at 83 bpm.  02/21/2015 ECG (independently read by me): Atrial fibrillation at 99 bpm.  01/30/2015 ECG (independently read by me): Atrial fibrillation at a approximately 90-100 bpm.  Small Q wave in lead 3.  Early transition.  LABS:  BMP Latest Ref Rng & Units 10/04/2017 08/19/2017 08/10/2017  Glucose 65 - 99 mg/dL 109(H) 126(H) 107(H)  BUN 7 - 25 mg/dL _0 Creatinine 0.70 - 1.18 mg/dL 0.99 1.08 0.88  BUN/Creat Ratio 6 - 22 (calc) NOT APPLICABLE - -  Sodium 511 - 146 mmol/L 141 141 138  Potassium 3.5 - 5.3 mmol/L 4.3 4.2 4.0  Chloride 98 - 110 mmol/L 102 101 104  CO2 20 - 32 mmol/L 27 32 27  Calcium 8.6 - 10.3  mg/dL 9.4 9.0 9.1    Hepatic Function Latest Ref Rng & Units 10/04/2017 07/29/2016 07/23/2016  Total Protein 6.1 - 8.1 g/dL 7.0 - 6.5  Albumin 3.6 - 5.1 g/dL - 3.7 3.8  AST 10 - 35 U/L 23 - 18  ALT 9 - 46 U/L 23 - 20  Alk Phosphatase 40 - 115 U/L - - 58  Total Bilirubin 0.2 - 1.2 mg/dL 0.5 - 0.5  Bilirubin, Direct 0.1 - 0.5 mg/dL - - -    CBC Latest Ref Rng & Units 10/04/2017 08/19/2017 08/10/2017  WBC 3.8 - 10.8 Thousand/uL 6.1 12.9(H) 7.6  Hemoglobin 13.2 - 17.1 g/dL 16.0 14.6 16.2  Hematocrit 38.5 - 50.0 % 47.4 44.0 47.6  Platelets 140 - 400 Thousand/uL 203 215 186   Lab Results  Component Value Date   MCV 92.0 10/04/2017   MCV 95.4 08/19/2017   MCV 92.8 08/10/2017    Lab Results  Component Value Date   TSH 0.97 10/04/2017    BNP No results found for: BNP  ProBNP No results found for: PROBNP   Lipid Panel     Component Value Date/Time   CHOL 138 10/04/2017 0844   CHOL 147 12/05/2015 0901   TRIG 73 10/04/2017 0844  HDL 53 10/04/2017 0844   HDL 55 12/05/2015 0901   CHOLHDL 2.6 10/04/2017 0844   VLDL 21 07/23/2016 0805   LDLCALC 70 10/04/2017 0844     RADIOLOGY: No results found.  IMPRESSION:  1. Permanent atrial fibrillation   2. Chronic anticoagulation - Eliquis, CHADS2VASC=2   3. Morbid obesity, unspecified obesity type ([place])   4. OSA (obstructive sleep apnea)   5. Essential hypertension     ASSESSMENT AND PLAN: Garrett Munoz is a 72 year-old gentleman who has a history of morbid obesity, hypertension, obstructive sleep apnea, permanent atrial fibrillation and is on chronic Coumadin therapy.  His weight is increased today and BMI is 44.63 consistent with morbid obesity.  He sleeps poorly.  I again had a long conversation with him regarding untreated sleep apnea which undoubtedly is playing a role in his poor sleep, frequent awakenings, need for daily naps, and daytime sleepiness.  He also has developed permanent atrial fibrillation.  I strongly  recommended he undergo reevaluation for sleep apnea.  I discussed with him new technology and significant improvement in the masks that are available.  He seems to be in agreement to consider doing this and he needs to decide and will let us know if we should schedule this or not.  We discussed the importance of weight loss.  We discussed the importance of exercise.  He is on anticoagulation and denies bleeding.  He is atrial fibrillation rate is controlled.  Lipid studies earlier this year done by Dr. Dennard Schaumann were stable with a total cholesterol 138 LDL 70 HDL 53 and triglycerides 73.  If he undergoes a sleep study I will see him in follow-up to assess compliance and benefit.  Otherwise I will see him in 6 months for reevaluation.   Time spent: 25 minutes [name], MD, [facility]  03/08/2018 6:17 PM

## 2018-03-08 ENCOUNTER — Encounter: Payer: Self-pay | Admitting: Cardiovascular Disease

## 2018-04-03 MED FILL — TEMAZEPAM 30 MG CAPSULE: 30 | 30 days supply | Qty: 30 | Fill #1

## 2018-04-03 MED FILL — CARVEDILOL 12.5 MG TABLET: 12.5 | 90 days supply | Qty: 225 | Fill #3

## 2018-04-03 MED FILL — ALLOPURINOL 300 MG TABS: 300 | 90 days supply | Qty: 90 | Fill #1

## 2018-05-03 MED FILL — ELIQUIS 5 MG TABLET: 5 | 30 days supply | Qty: 60 | Fill #0

## 2018-05-04 ENCOUNTER — Other Ambulatory Visit: Payer: Self-pay | Admitting: Family Medicine

## 2018-05-04 MED FILL — TEMAZEPAM 30 MG CAPSULE: 30 | 30 days supply | Qty: 30 | Fill #0

## 2018-05-04 NOTE — Telephone Encounter (Signed)
Ok to refill??  Last office visit 02/09/2018.  Last refill 02/28/2018.

## 2018-05-18 MED FILL — AMOXICILLIN 500 MG CAPSULE: 500 | 3 days supply | Qty: 12 | Fill #2

## 2018-06-01 MED FILL — ELIQUIS 5 MG TABLET: 5 | 30 days supply | Qty: 60 | Fill #1

## 2018-06-01 MED FILL — TEMAZEPAM 30 MG CAPSULE: 30 | 30 days supply | Qty: 30 | Fill #1

## 2018-06-29 ENCOUNTER — Other Ambulatory Visit: Payer: Self-pay | Admitting: Cardiovascular Disease

## 2018-06-29 ENCOUNTER — Other Ambulatory Visit: Payer: Self-pay | Admitting: Family Medicine

## 2018-06-29 MED FILL — ALLOPURINOL 300 MG TABS: 300 | 90 days supply | Qty: 90 | Fill #2

## 2018-06-29 MED FILL — ELIQUIS 5 MG TABLET: 5 | 90 days supply | Qty: 180 | Fill #2

## 2018-06-29 MED FILL — CARVEDILOL 12.5 MG TABLET: 12.5 | 90 days supply | Qty: 225 | Fill #0

## 2018-06-29 NOTE — Telephone Encounter (Signed)
Ok to refill??  Last office visit 02/09/2018.  Last refill 05/04/2018, #1 refill.

## 2018-06-30 MED FILL — TEMAZEPAM 30 MG CAPSULE: 30 | 30 days supply | Qty: 30 | Fill #0 | Status: TO

## 2018-07-24 ENCOUNTER — Other Ambulatory Visit: Payer: Self-pay

## 2018-07-24 ENCOUNTER — Ambulatory Visit (INDEPENDENT_AMBULATORY_CARE_PROVIDER_SITE_OTHER): Payer: PPO | Admitting: Family Medicine

## 2018-07-24 DIAGNOSIS — J019 Acute sinusitis, unspecified: Secondary | ICD-10-CM

## 2018-07-24 MED ORDER — FLUTICASONE PROPIONATE 50 MCG/ACT NA SUSP: 2.0000 | Freq: Every day | NASAL | 6 refills | Status: DC

## 2018-07-24 MED ORDER — PROMETHAZINE HCL 12.5 MG PO TABS: 12.5000 mg | ORAL_TABLET | Freq: Three times a day (TID) | ORAL | 0 refills | Status: DC | PRN

## 2018-07-24 MED ORDER — AMOXICILLIN-POT CLAVULANATE 875-125 MG PO TABS: 1.0000 | ORAL_TABLET | Freq: Two times a day (BID) | ORAL | 0 refills | Status: DC

## 2018-07-24 NOTE — Progress Notes (Signed)
Subjective:    Patient ID: Garrett Munoz, male    DOB: [birth year], 73 y.o.   MRN: [id number]  HPI Patient is being seen today via telephone visit.  Patient is currently at home.  I am currently in my office.  Patient consents to be seen via telephone.  Phone call began at 918.  Office visit concluded at 929.  Patient states that he started having sinus congestion about a week ago that he attributed to pollen and allergies.  He has not been outside the home.  He has not done any traveling.  He has no sick contacts that he is aware of.  He ports rhinorrhea.  He also reports postnasal drip causing a cough times last 3 days.  He denies any shortness of breath.  However he has developed some nausea and vomiting.  This morning he developed fevers and chills.  He is not checking his temperature however he just feels cold.  He is adamant that he is not short of breath.  He denies any body aches.  Cough is productive of clear phlegm Past Medical History:  Diagnosis Date  . Atrial fibrillation ([place])   . Bronchitis    hx  . Cataract    right eye  . DDD (degenerative disc disease), lumbosacral   . Dysrhythmia    irregular heatbeat  . Eye worm    right eye  . Gout   . History of kidney stones   . Hypertension   . Left knee DJD   . Morbid obesity with BMI of 40.0-44.9, adult (Garrett Munoz)   . Pneumonia    hx  . Prediabetes   . Radicular syndrome of left leg   . S/P total knee replacement    right  . Sleep apnea    can't use sleep study 10 yrs ago  . 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    Past Surgical History:  Procedure Laterality Date  . CARDIOVERSION N/A 03/04/2015   Procedure: CARDIOVERSION;  Surgeon: [name], MD;  Location: [place];  Service: Cardiovascular;  Laterality: N/A;  . CHOLECYSTECTOMY    . COLONOSCOPY N/A 01/01/2016   Procedure: COLONOSCOPY;  Surgeon: [name], MD;  [facility];  Service: Endoscopy;  Laterality: N/A;  1:45  . COLONOSCOPY N/A 01/14/2017   Procedure: COLONOSCOPY;  Surgeon: [name], MD;  [facility];  Service: Endoscopy;  Laterality: N/A;  12:05  . EYE EXAMINATION UNDER ANESTHESIA W/ RETINAL CRYOTHERAPY AND RETINAL LASER  07/2011  . EYE SURGERY  02/2011   cat rt  . KNEE ARTHROSCOPY     bilateral  . LUMBAR LAMINECTOMY/DECOMPRESSION MICRODISCECTOMY N/A 08/18/2017   Procedure: Bilateral microlumbar decompression Lumbar four-Lumbar five;  Surgeon: [name], MD;  Location: [place];  Service: Orthopedics;  Laterality: N/A;  . POLYPECTOMY  01/01/2016   Procedure: POLYPECTOMY;  Surgeon: [name], MD;  [facility];  Service: Endoscopy;;  colon  . RETINAL DETACHMENT SURGERY  03/2011   rt  . TOTAL KNEE ARTHROPLASTY  2003   rt  . TOTAL KNEE ARTHROPLASTY  02/07/2012   Procedure: TOTAL KNEE ARTHROPLASTY;  Surgeon: [name], MD;  Location: [place];  Service: Orthopedics;  Laterality: Left;   Current Outpatient Medications on File Prior to Visit  Medication Sig Dispense Refill  . acetaminophen (TYLENOL) 325 MG tablet Take 2 tablets (650 mg total) by mouth every 6 (six) hours as needed (or Fever >/= 101).    [name]  allopurinol (ZYLOPRIM) 300 MG tablet TAKE 1 TABLET (300 MG TOTAL) BY MOUTH DAILY. 90 tablet 3  . apixaban (ELIQUIS) 5 MG TABS tablet Take 1 tablet (5 mg total) by mouth 2 (two) times daily. Resume 5 days post-op 180 tablet 3  . carvedilol (COREG) 12.5 MG tablet TAKE 1 & 1/2 TABLETS BY MOUTH EVERY MORNING AND 1 TABLET IN THE EVENING 225 tablet 0  . magnesium oxide (MAG-OX) 400 MG tablet Take 400 mg by mouth every evening.     . temazepam (RESTORIL) 30 MG capsule TAKE 1 CAPSULE BY MOUTH AT BEDTIME AS NEEDED FOR SLEEP. 30 capsule 1   No current facility-administered medications on file prior to visit.    No Known Allergies Social History   Socioeconomic History  . Marital status: Married    Spouse name: Not on file  . Number of children: Not on file  . Years of education: Not on file  .  Highest education level: Not on file  Occupational History  . Not on file  Social Needs  . Financial resource strain: Not on file  . Food insecurity:    Worry: Not on file    Inability: Not on file  . Transportation needs:    Medical: Not on file    Non-medical: Not on file  Tobacco Use  . Smoking status: Former Smoker    Packs/day: 2.00    Years: 15.00    Pack years: 30.00    Types: Cigarettes    Last attempt to quit: 02/02/1992    Years since quitting: 26.4  . Smokeless tobacco: Never Used  . Tobacco comment: occ alcohol  Substance and Sexual Activity  . Alcohol use: Yes    Alcohol/week: 0.0 standard drinks    Comment: occasional  . Drug use: No  . Sexual activity: Not on file  Lifestyle  . Physical activity:    Days per week: Not on file    Minutes per session: Not on file  . Stress: Not on file  Relationships  . Social connections:    Talks on phone: Not on file    Gets together: Not on file    Attends religious service: Not on file    Active member of club or organization: Not on file    Attends meetings of clubs or organizations: Not on file    Relationship status: Not on file  . Intimate partner violence:    Fear of current or ex partner: Not on file    Emotionally abused: Not on file    Physically abused: Not on file    Forced sexual activity: Not on file  Other Topics Concern  . Not on file  Social History Narrative  . Not on file      Review of Systems  All other systems reviewed and are negative.      Objective:   Physical Exam   No physical exam was performed as this was a telephone visit     Assessment & Plan:  Acute rhinosinusitis - Plan: amoxicillin-clavulanate (AUGMENTIN) 875-125 MG tablet, fluticasone (FLONASE) 50 MCG/ACT nasal spray, promethazine (PHENERGAN) 12.5 MG tablet  Patient symptoms began with allergies and sinus congestion and proceeded to cause thick rhinorrhea, fevers and chills and a headache.  He also has a cough  productive of clear sputum.  Symptoms seem more consistent with a sinus infection.  He denies any shortness of breath or chest pain.  However I discussed with the patient that his symptoms could also be  attributed to the novel coronavirus.  At the present time he denies any symptoms that require hospitalization.  Therefore I have not recommended triage at the hospital.  Instead I recommended self quarantine over the next 14 days.  I will treat the patient empirically for sinus infection with Augmentin 875 mg p.o. twice daily for 10 days and Flonase 2 sprays each nostril daily.  He can treat his nausea with Phenergan 12.5 mg every 8 hours as needed.  Push fluids and rest.  If he develops worsening cough or develops severe fever and shortness of breath I have recommended hospital evaluation for possible coronavirus however the present time we discussed self quarantine at home and the patient is comfortable with this.

## 2018-07-26 MED FILL — TEMAZEPAM 30 MG CAPSULE: 30 | 30 days supply | Qty: 30 | Fill #0

## 2018-08-15 DIAGNOSIS — G5603 Carpal tunnel syndrome, bilateral upper limbs: Secondary | ICD-10-CM | POA: Diagnosis not present

## 2018-08-24 ENCOUNTER — Ambulatory Visit (INDEPENDENT_AMBULATORY_CARE_PROVIDER_SITE_OTHER): Payer: PPO | Admitting: Family Medicine

## 2018-08-24 ENCOUNTER — Other Ambulatory Visit: Payer: Self-pay

## 2018-08-24 VITALS — BP 126/74 | HR 66 | Temp 98.2°F | Resp 20 | Ht 70.5 in | Wt 317.0 lb

## 2018-08-24 DIAGNOSIS — R5382 Chronic fatigue, unspecified: Secondary | ICD-10-CM

## 2018-08-24 NOTE — Progress Notes (Signed)
Subjective:    Patient ID: Garrett Munoz, male    DOB: 1946/01/21, 73 y.o.   MRN: [id number]  HPI  Patient reports intermittent fatigue for the last several months which is gradually been getting worse over the last 2 months.  He reports thinning hair.  He reports weight gain.  He reports feeling cold all the time.  He reports constipation.  He reports occasional bradycardias in the 50s.  He reports poor appetite.  He reports dry skin.  He is concerned that he may have an underactive thyroid.  He reports poor libido.  He is pain.  He denies shortness of breath.  He denies dyspnea on exertion.  He denies any depression or suicidal ideation.  He denies any fevers chills or weight loss.  In fact he has gained weight.  He denies any shortness of breath.  He denies any pleurisy or hemoptysis.  He denies any melena or hematochezia.  He denies any hematemesis or hematuria.  He denies any abdominal pain.  He does have joint pains all over his body however Past Medical History:  Diagnosis Date  . Atrial fibrillation ([place])   . Bronchitis    hx  . Cataract    right eye  . DDD (degenerative disc disease), lumbosacral   . Dysrhythmia    irregular heatbeat  . Eye worm    right eye  . Gout   . History of kidney stones   . Hypertension   . Left knee DJD   . Morbid obesity with BMI of 40.0-44.9, adult ([place])   . Pneumonia    hx  . Prediabetes   . Radicular syndrome of left leg   . S/P total knee replacement    right  . Sleep apnea    can't use sleep study 10 yrs ago  . 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    Past Surgical History:  Procedure Laterality Date  . CARDIOVERSION N/A 03/04/2015   Procedure: CARDIOVERSION;  Surgeon: [name], MD;  Location: [place];  Service: Cardiovascular;  Laterality: N/A;  . CHOLECYSTECTOMY    . COLONOSCOPY N/A 01/01/2016   Procedure: COLONOSCOPY;  Surgeon: [name], MD;  [facility];  Service: Endoscopy;  Laterality: N/A;  1:45  .  COLONOSCOPY N/A 01/14/2017   Procedure: COLONOSCOPY;  Surgeon: [name], MD;  [facility];  Service: Endoscopy;  Laterality: N/A;  12:05  . EYE EXAMINATION UNDER ANESTHESIA W/ RETINAL CRYOTHERAPY AND RETINAL LASER  07/2011  . EYE SURGERY  02/2011   cat rt  . KNEE ARTHROSCOPY     bilateral  . LUMBAR LAMINECTOMY/DECOMPRESSION MICRODISCECTOMY N/A 08/18/2017   Procedure: Bilateral microlumbar decompression Lumbar four-Lumbar five;  Surgeon: [name], MD;  Location: [place];  Service: Orthopedics;  Laterality: N/A;  . POLYPECTOMY  01/01/2016   Procedure: POLYPECTOMY;  Surgeon: [name], MD;  [facility];  Service: Endoscopy;;  colon  . RETINAL DETACHMENT SURGERY  03/2011   rt  . TOTAL KNEE ARTHROPLASTY  2003   rt  . TOTAL KNEE ARTHROPLASTY  02/07/2012   Procedure: TOTAL KNEE ARTHROPLASTY;  Surgeon: [name], MD;  Location: [place];  Service: Orthopedics;  Laterality: Left;   Current Outpatient Medications on File Prior to Visit  Medication Sig Dispense Refill  . acetaminophen (TYLENOL) 325 MG tablet Take 2 tablets (650 mg total) by mouth every 6 (six) hours as needed (or Fever >/= 101).    [name] allopurinol (  ZYLOPRIM) 300 MG tablet TAKE 1 TABLET (300 MG TOTAL) BY MOUTH DAILY. 90 tablet 3  . apixaban (ELIQUIS) 5 MG TABS tablet Take 1 tablet (5 mg total) by mouth 2 (two) times daily. Resume 5 days post-op 180 tablet 3  . carvedilol (COREG) 12.5 MG tablet TAKE 1 & 1/2 TABLETS BY MOUTH EVERY MORNING AND 1 TABLET IN THE EVENING 225 tablet 0  . fluticasone (FLONASE) 50 MCG/ACT nasal spray Place 2 sprays into both nostrils daily. 16 g 6  . magnesium oxide (MAG-OX) 400 MG tablet Take 400 mg by mouth every evening.     . promethazine (PHENERGAN) 12.5 MG tablet Take 1 tablet (12.5 mg total) by mouth every 8 (eight) hours as needed for nausea or vomiting. 20 tablet 0  . temazepam (RESTORIL) 30 MG capsule TAKE 1 CAPSULE BY MOUTH AT BEDTIME AS NEEDED FOR SLEEP. 30  capsule 1   No current facility-administered medications on file prior to visit.    No Known Allergies Social History   Socioeconomic History  . Marital status: Married    Spouse name: Not on file  . Number of children: Not on file  . Years of education: Not on file  . Highest education level: Not on file  Occupational History  . Not on file  Social Needs  . Financial resource strain: Not on file  . Food insecurity:    Worry: Not on file    Inability: Not on file  . Transportation needs:    Medical: Not on file    Non-medical: Not on file  Tobacco Use  . Smoking status: Former Smoker    Packs/day: 2.00    Years: 15.00    Pack years: 30.00    Types: Cigarettes    Last attempt to quit: 02/02/1992    Years since quitting: 26.5  . Smokeless tobacco: Never Used  . Tobacco comment: occ alcohol  Substance and Sexual Activity  . Alcohol use: Yes    Alcohol/week: 0.0 standard drinks    Comment: occasional  . Drug use: No  . Sexual activity: Not on file  Lifestyle  . Physical activity:    Days per week: Not on file    Minutes per session: Not on file  . Stress: Not on file  Relationships  . Social connections:    Talks on phone: Not on file    Gets together: Not on file    Attends religious service: Not on file    Active member of club or organization: Not on file    Attends meetings of clubs or organizations: Not on file    Relationship status: Not on file  . Intimate partner violence:    Fear of current or ex partner: Not on file    Emotionally abused: Not on file    Physically abused: Not on file    Forced sexual activity: Not on file  Other Topics Concern  . Not on file  Social History Narrative  . Not on file      Review of Systems  All other systems reviewed and are negative.      Objective:   Physical Exam  Constitutional: He is oriented to person, place, and time. He appears well-developed and well-nourished. No distress.  HENT:  Head:  Normocephalic and atraumatic.  Right Ear: External ear normal.  Left Ear: External ear normal.  Nose: Nose normal.  Mouth/Throat: Oropharynx is clear and moist. No oropharyngeal exudate.  Eyes: Pupils are equal, round, and reactive to  light. Conjunctivae and EOM are normal. Right eye exhibits no discharge. Left eye exhibits no discharge. No scleral icterus.  Neck: Neck supple. No JVD present. No tracheal deviation present. No thyromegaly present.  Cardiovascular: Normal rate and normal heart sounds. An irregularly irregular rhythm present. Exam reveals no gallop and no friction rub.  No murmur heard. Pulmonary/Chest: Effort normal and breath sounds normal. No stridor. No respiratory distress. He has no wheezes. He has no rales. He exhibits no tenderness.  Abdominal: Soft. Bowel sounds are normal. He exhibits no distension and no mass. There is no abdominal tenderness. There is no rebound and no guarding.  Genitourinary:    Prostate and rectum normal.   Musculoskeletal:        General: No edema.  Lymphadenopathy:    He has no cervical adenopathy.  Neurological: He is alert and oriented to person, place, and time. He has normal reflexes. No cranial nerve deficit. He exhibits normal muscle tone. Coordination normal.  Skin: Skin is warm. No rash noted. He is not diaphoretic. No erythema. No pallor.  Psychiatric: He has a normal mood and affect. His behavior is normal. Judgment and thought content normal.  Vitals reviewed.         Assessment & Plan:  Chronic fatigue - Plan: CBC with Differential/Platelet, COMPLETE METABOLIC PANEL WITH GFR, Vitamin B12, TSH, Testosterone  Exam today is only significant for irregularly irregular heart rhythm consistent with his known atrial fibrillation.  I see no explanation for his fatigue based on his physical exam other than the atrial fibrillation.  Begin by obtaining baseline lab work including a CBC to evaluate for any evidence of leukemia and/or  anemia.  Obtain a vitamin B12 to evaluate for B12 deficiency.  Check a TSH to evaluate for hypothyroidism.  Check a CMP to monitor kidney and liver function test as well as his blood sugar.  Check a testosterone level to evaluate for hypogonadism.  Consider possibly decreasing his carvedilol if lab work is normal given his episodic occasional bradycardia and fatigue as this perhaps could be contributing to the

## 2018-08-25 LAB — COMPLETE METABOLIC PANEL WITH GFR
AG Ratio: 1.7 (calc) (ref 1.0–2.5)
ALT: 20 U/L (ref 9–46)
AST: 20 U/L (ref 10–35)
Albumin: 4.3 g/dL (ref 3.6–5.1)
Alkaline phosphatase (APISO): 65 U/L (ref 35–144)
BUN: 13 mg/dL (ref 7–25)
CO2: 31 mmol/L (ref 20–32)
Calcium: 9.1 mg/dL (ref 8.6–10.3)
Chloride: 104 mmol/L (ref 98–110)
Creat: 0.88 mg/dL (ref 0.70–1.18)
GFR, Est African American: 98 mL/min/{1.73_m2} (ref >=60)
GFR, Est Non African American: 85 mL/min/{1.73_m2} (ref >=60)
Globulin: 2.5 g/dL (calc) (ref 1.9–3.7)
Glucose, Bld: 99 mg/dL (ref 65–99)
Potassium: 5 mmol/L (ref 3.5–5.3)
Sodium: 143 mmol/L (ref 135–146)
Total Bilirubin: 0.6 mg/dL (ref 0.2–1.2)
Total Protein: 6.8 g/dL (ref 6.1–8.1)

## 2018-08-25 LAB — CBC WITH DIFFERENTIAL/PLATELET
Absolute Monocytes: 592 cells/uL (ref 200–950)
Basophils Absolute: 50 cells/uL (ref 0–200)
Basophils Relative: 0.8 %
Eosinophils Absolute: 359 cells/uL (ref 15–500)
Eosinophils Relative: 5.7 %
HCT: 48.1 % (ref 38.5–50.0)
Hemoglobin: 16.8 g/dL (ref 13.2–17.1)
Lymphs Abs: 1903 cells/uL (ref 850–3900)
MCH: 32.9 pg (ref 27.0–33.0)
MCHC: 34.9 g/dL (ref 32.0–36.0)
MCV: 94.1 fL (ref 80.0–100.0)
MPV: 11.7 fL (ref 7.5–12.5)
Monocytes Relative: 9.4 %
Neutro Abs: 3396 cells/uL (ref 1500–7800)
Neutrophils Relative %: 53.9 %
Platelets: 197 10*3/uL (ref 140–400)
RBC: 5.11 10*6/uL (ref 4.20–5.80)
RDW: 13.2 % (ref 11.0–15.0)
Total Lymphocyte: 30.2 %
WBC: 6.3 10*3/uL (ref 3.8–10.8)

## 2018-08-25 LAB — VITAMIN B12: Vitamin B-12: 330 pg/mL (ref 200–1100)

## 2018-08-25 LAB — TESTOSTERONE: Testosterone: 368 ng/dL (ref 250–827)

## 2018-08-25 LAB — TSH: TSH: 0.88 mIU/L (ref 0.40–4.50)

## 2018-08-28 ENCOUNTER — Other Ambulatory Visit: Payer: Self-pay | Admitting: Family Medicine

## 2018-08-28 DIAGNOSIS — G5623 Lesion of ulnar nerve, bilateral upper limbs: Secondary | ICD-10-CM | POA: Diagnosis not present

## 2018-08-28 DIAGNOSIS — G5601 Carpal tunnel syndrome, right upper limb: Secondary | ICD-10-CM | POA: Diagnosis not present

## 2018-08-28 DIAGNOSIS — G5621 Lesion of ulnar nerve, right upper limb: Secondary | ICD-10-CM | POA: Diagnosis not present

## 2018-08-28 DIAGNOSIS — G5602 Carpal tunnel syndrome, left upper limb: Secondary | ICD-10-CM | POA: Diagnosis not present

## 2018-08-28 DIAGNOSIS — G5622 Lesion of ulnar nerve, left upper limb: Secondary | ICD-10-CM | POA: Diagnosis not present

## 2018-08-28 DIAGNOSIS — G5603 Carpal tunnel syndrome, bilateral upper limbs: Secondary | ICD-10-CM | POA: Diagnosis not present

## 2018-08-28 MED FILL — AMOXICILLIN 500 MG CAPSULE: 500 | 3 days supply | Qty: 12 | Fill #0

## 2018-08-28 MED FILL — TEMAZEPAM 30 MG CAPSULE: 30 | 30 days supply | Qty: 30 | Fill #0

## 2018-08-28 NOTE — Telephone Encounter (Signed)
Ok to refill??  Last office visit 08/24/2018  Last refill 06/30/2018.

## 2018-09-05 ENCOUNTER — Ambulatory Visit: Payer: PPO | Admitting: Cardiovascular Disease

## 2018-09-05 DIAGNOSIS — G5601 Carpal tunnel syndrome, right upper limb: Secondary | ICD-10-CM | POA: Diagnosis not present

## 2018-09-05 DIAGNOSIS — G5602 Carpal tunnel syndrome, left upper limb: Secondary | ICD-10-CM | POA: Diagnosis not present

## 2018-09-20 ENCOUNTER — Telehealth: Payer: Self-pay | Admitting: Family Medicine

## 2018-09-20 DIAGNOSIS — G4733 Obstructive sleep apnea (adult) (pediatric): Secondary | ICD-10-CM

## 2018-09-20 DIAGNOSIS — R5382 Chronic fatigue, unspecified: Secondary | ICD-10-CM

## 2018-09-20 NOTE — Telephone Encounter (Signed)
Pt called and states that he needs replacement CPAP machine. He state that it has been 20+ years since his last sleep study. Recommend updating that as we need to know what settings he need for CPAP. Will place referral for sleep study as he does have OSA as documented throughout the chart.

## 2018-09-29 ENCOUNTER — Telehealth: Payer: Self-pay | Admitting: Cardiovascular Disease

## 2018-09-29 ENCOUNTER — Other Ambulatory Visit: Payer: Self-pay | Admitting: Cardiovascular Disease

## 2018-09-29 ENCOUNTER — Other Ambulatory Visit: Payer: Self-pay

## 2018-09-29 ENCOUNTER — Other Ambulatory Visit: Payer: Self-pay | Admitting: Family Medicine

## 2018-09-29 MED ORDER — CARVEDILOL 12.5 MG PO TABS: ORAL_TABLET | ORAL | 0 refills | Status: DC

## 2018-09-29 MED FILL — CARVEDILOL 12.5 MG TABLET: 12.5 | 90 days supply | Qty: 225 | Fill #0

## 2018-09-29 MED FILL — ELIQUIS 5 MG TABLET: 5 | 90 days supply | Qty: 180 | Fill #3

## 2018-09-29 MED FILL — ALLOPURINOL 300 MG TABS: 300 | 90 days supply | Qty: 90 | Fill #3

## 2018-09-29 NOTE — Telephone Encounter (Signed)
New message   Pt c/o medication issue:  1. Name of Medication:carvedilol (COREG) 12.5 MG tablet  2. How are you currently taking this medication (dosage and times per day)? TAKE 1 & 1/2 TABLETS BY MOUTH EVERY MORNING AND 1 TABLET IN THE EVENING  3. Are you having a reaction (difficulty breathing--STAT)? No   4. What is your medication issue? Patient's wife states that need a new prescription to [facility] on [address].

## 2018-09-29 NOTE — Telephone Encounter (Signed)
Called spoke with wife that RX had been sent already, but she states the pharmacy did not have anything. I advised I would send it in again. Patient verbalized understanding.

## 2018-09-29 NOTE — Telephone Encounter (Signed)
Ok to refill??  Last office visit 08/24/2018.  Last refill 08/28/2018.

## 2018-09-29 NOTE — Telephone Encounter (Signed)
Called and notified patient wife that medication was sent this morning, but she states they still had not received anything yet- I advised I would resubmit. Patient wife was thankful for the call.

## 2018-10-02 MED FILL — TEMAZEPAM 30 MG CAPSULE: 30 | 30 days supply | Qty: 30 | Fill #0

## 2018-10-25 DIAGNOSIS — G5601 Carpal tunnel syndrome, right upper limb: Secondary | ICD-10-CM | POA: Diagnosis not present

## 2018-10-25 MED FILL — HYDROCODON-APAP 5-325: 5-325 | 5 days supply | Qty: 15 | Fill #0

## 2018-11-02 ENCOUNTER — Other Ambulatory Visit: Payer: Self-pay | Admitting: Family Medicine

## 2018-11-02 MED FILL — TEMAZEPAM 30 MG CAPSULE: 30 | 30 days supply | Qty: 30 | Fill #0

## 2018-11-02 NOTE — Telephone Encounter (Signed)
Ok to refill??  Last office visit 08/24/2018.  Last refill 10/02/2018.

## 2018-11-09 DIAGNOSIS — Z4789 Encounter for other orthopedic aftercare: Secondary | ICD-10-CM | POA: Diagnosis not present

## 2018-11-09 DIAGNOSIS — G5601 Carpal tunnel syndrome, right upper limb: Secondary | ICD-10-CM | POA: Diagnosis not present

## 2018-11-30 ENCOUNTER — Other Ambulatory Visit: Payer: Self-pay | Admitting: Family Medicine

## 2018-12-01 NOTE — Telephone Encounter (Signed)
Ok to refill??  Last office visit 08/24/2018.  Last refill 11/02/2018.

## 2018-12-04 MED FILL — TEMAZEPAM 30 MG CAPSULE: 30 | 30 days supply | Qty: 30 | Fill #0

## 2018-12-20 ENCOUNTER — Ambulatory Visit (INDEPENDENT_AMBULATORY_CARE_PROVIDER_SITE_OTHER): Payer: PPO | Admitting: Family Medicine

## 2018-12-20 ENCOUNTER — Other Ambulatory Visit: Payer: Self-pay

## 2018-12-20 DIAGNOSIS — Z23 Encounter for immunization: Secondary | ICD-10-CM | POA: Diagnosis not present

## 2019-01-02 ENCOUNTER — Other Ambulatory Visit: Payer: Self-pay | Admitting: Cardiovascular Disease

## 2019-01-02 ENCOUNTER — Other Ambulatory Visit: Payer: Self-pay | Admitting: Family Medicine

## 2019-01-02 MED FILL — CARVEDILOL 12.5 MG TABLET: 12.5 | 90 days supply | Qty: 225 | Fill #0

## 2019-01-02 MED FILL — ELIQUIS 5 MG TABLET: 5 | 30 days supply | Qty: 60 | Fill #4

## 2019-01-02 NOTE — Telephone Encounter (Signed)
Ok to refill restoril??  Last office visit 08/24/2018.  Last refill 12/04/2018. Ok to add refills x2?

## 2019-01-03 MED FILL — TEMAZEPAM 30 MG CAPSULE: 30 | 30 days supply | Qty: 30 | Fill #0

## 2019-01-03 MED FILL — ALLOPURINOL 300 MG TABS: 300 | 90 days supply | Qty: 90 | Fill #0

## 2019-01-04 ENCOUNTER — Ambulatory Visit: Payer: PPO | Admitting: Family Medicine

## 2019-01-05 ENCOUNTER — Encounter: Payer: Self-pay | Admitting: Family Medicine

## 2019-01-05 ENCOUNTER — Other Ambulatory Visit: Payer: Self-pay

## 2019-01-05 ENCOUNTER — Ambulatory Visit (INDEPENDENT_AMBULATORY_CARE_PROVIDER_SITE_OTHER): Payer: PPO | Admitting: Family Medicine

## 2019-01-05 VITALS — BP 136/80 | HR 68 | Temp 98.4°F | Resp 16 | Ht 70.5 in | Wt 323.0 lb

## 2019-01-05 DIAGNOSIS — M533 Sacrococcygeal disorders, not elsewhere classified: Secondary | ICD-10-CM | POA: Diagnosis not present

## 2019-01-05 MED ORDER — HYDROCODONE-ACETAMINOPHEN 5-325 MG PO TABS: 1.0000 | ORAL_TABLET | Freq: Four times a day (QID) | ORAL | 0 refills | Status: DC | PRN

## 2019-01-05 MED ORDER — PREDNISONE 20 MG PO TABS: ORAL_TABLET | ORAL | 0 refills | Status: DC

## 2019-01-05 MED FILL — HYDROCODON-APAP 5-325: 5-325 | 8 days supply | Qty: 30 | Fill #0

## 2019-01-05 MED FILL — predniSONE 20 MG TABS: 20 | 6 days supply | Qty: 12 | Fill #0
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
## 2019-01-31 MED FILL — TEMAZEPAM 30 MG CAPSULE: 30 | 30 days supply | Qty: 30 | Fill #1

## 2019-02-01 DIAGNOSIS — G5601 Carpal tunnel syndrome, right upper limb: Secondary | ICD-10-CM | POA: Diagnosis not present

## 2019-02-27 ENCOUNTER — Encounter (INDEPENDENT_AMBULATORY_CARE_PROVIDER_SITE_OTHER): Payer: Self-pay | Admitting: Internal Medicine

## 2019-02-27 ENCOUNTER — Ambulatory Visit (INDEPENDENT_AMBULATORY_CARE_PROVIDER_SITE_OTHER): Payer: PPO | Admitting: Internal Medicine

## 2019-02-27 ENCOUNTER — Other Ambulatory Visit: Payer: Self-pay

## 2019-02-27 VITALS — BP 153/98 | HR 72 | Temp 98.1°F | Ht 71.0 in | Wt 319.7 lb

## 2019-02-27 DIAGNOSIS — K580 Irritable bowel syndrome with diarrhea: Secondary | ICD-10-CM

## 2019-02-27 DIAGNOSIS — Z86010 Personal history of colonic polyps: Secondary | ICD-10-CM

## 2019-02-27 DIAGNOSIS — K589 Irritable bowel syndrome without diarrhea: Secondary | ICD-10-CM | POA: Insufficient documentation

## 2019-02-27 MED ORDER — DICYCLOMINE HCL 10 MG PO CAPS: 10.0000 mg | ORAL_CAPSULE | Freq: Two times a day (BID) | ORAL | 5 refills | Status: DC

## 2019-02-27 MED FILL — DICYCLOMINE 10 MG CAPSULE: 10 | 30 days supply | Qty: 60 | Fill #0

## 2019-02-27 NOTE — Patient Instructions (Signed)
Please call with progress report in 1 month.  However if you experience side effects with dicyclomine he can stop the medication and call office. Your next colonoscopy would be in September 2023.

## 2019-02-27 NOTE — Progress Notes (Signed)
Presenting complaint;  Abdominal pain and diarrhea. History of colonic polyps.  Database and subjective:  Patient is 73 year old Caucasian male who has history of colonic polyps who now presents with left-sided abdominal pain diarrhea and urgency. His first colonoscopy was in September 2017 with removal of 6 small polyps and these were tubular adenomas.  His prep was suboptimal.  He was advised to return for repeat exam in a year.  He had another 6 polyps removed but these were hyperplastic polyps.  Patient was advised to return for follow-up visit in 5 years. He wants to know if he should come earlier than that.  He complains of intermittent left-sided abdominal pain associated with diarrhea and urgency.  He has a bowel movement almost after every meal.  He recalls that he was diagnosed with irritable bowel syndrome by [facility] Dr. Shelly Flatten pain.  He is not sure what he was treated with if any.  He denies nocturnal bowel movements melena or rectal bleeding.  He has good appetite and denies weight loss.  In fact he has gained 19 pounds since his last visit of August 2018.  He says on account of knee arthritis and back pain he is not able to do much walking.  He is on apixaban for history of atrial fibrillation.   Current Medications: Outpatient Encounter Medications as of 02/27/2019  Medication Sig  . acetaminophen (TYLENOL) 325 MG tablet Take 2 tablets (650 mg total) by mouth every 6 (six) hours as needed (or Fever >/= 101).  [name] allopurinol (ZYLOPRIM) 300 MG tablet TAKE 1 TABLET (300 MG TOTAL) BY MOUTH DAILY.  [name] apixaban (ELIQUIS) 5 MG TABS tablet Take 1 tablet (5 mg total) by mouth 2 (two) times daily. Resume 5 days post-op  . carvedilol (COREG) 12.5 MG tablet TAKE 1 & 1/2 TABLETS BY MOUTH EVERY MORNING AND 1 TABLET IN THE EVENING  . fluticasone (FLONASE) 50 MCG/ACT nasal spray Place 2 sprays into both nostrils daily.  [name] HYDROcodone-acetaminophen (NORCO) 5-325 MG tablet Take 1 tablet by mouth  every 6 (six) hours as needed for moderate pain.  [name] temazepam (RESTORIL) 30 MG capsule TAKE 1 CAPSULE BY MOUTH AT BEDTIME AS NEEDED FOR SLEEP.  . magnesium oxide (MAG-OX) 400 MG tablet Take 400 mg by mouth every evening.   . [DISCONTINUED] predniSONE (DELTASONE) 20 MG tablet 3 tabs poqday 1-2, 2 tabs poqday 3-4, 1 tab poqday 5-6 (Patient not taking: Reported on 02/27/2019)   No facility-administered encounter medications on file as of 02/27/2019.      Objective: Blood pressure (!) 153/98, pulse 72, temperature 98.1 F (36.7 C), temperature source Oral, height _0  (1.803 m), weight (!) 319 lb 11.2 oz (145 kg). Patient is alert and in no acute distress. He is wearing a facial mask. Conjunctiva is pink. Sclera is nonicteric Oropharyngeal mucosa is normal. No neck masses or thyromegaly noted. Cardiac exam with iregular rhythm normal S1 and S2. No murmur or gallop noted. Lungs are clear to auscultation. Abdomen is obese.  Bowel sounds are normal.  On palpation abdomen is soft and nontender without organomegaly or masses. No LE edema or clubbing noted.  Labs/studies Results:  CBC Latest Ref Rng & Units 08/24/2018 10/04/2017 08/19/2017  WBC 3.8 - 10.8 Thousand/uL 6.3 6.1 12.9(H)  Hemoglobin 13.2 - 17.1 g/dL 16.8 16.0 14.6  Hematocrit 38.5 - 50.0 % 48.1 47.4 44.0  Platelets 140 - 400 Thousand/uL 197 203 215    CMP Latest Ref Rng & Units 08/24/2018 10/04/2017 08/19/2017  Glucose  65 - 99 mg/dL 99 109(H) 126(H)  BUN 7 - 25 mg/dL _0 Creatinine 0.70 - 1.18 mg/dL 0.88 0.99 1.08  Sodium 135 - 146 mmol/L 143 141 141  Potassium 3.5 - 5.3 mmol/L 5.0 4.3 4.2  Chloride 98 - 110 mmol/L 104 102 101  CO2 20 - 32 mmol/L 31 27 32  Calcium 8.6 - 10.3 mg/dL 9.1 9.4 9.0  Total Protein 6.1 - 8.1 g/dL 6.8 7.0 -  Total Bilirubin 0.2 - 1.2 mg/dL 0.6 0.5 -  Alkaline Phos 40 - 115 U/L - - -  AST 10 - 35 U/L 20 23 -  ALT 9 - 46 U/L 20 23 -    Hepatic Function Latest Ref Rng & Units 08/24/2018 10/04/2017  07/29/2016  Total Protein 6.1 - 8.1 g/dL 6.8 7.0 -  Albumin 3.6 - 5.1 g/dL - - 3.7  AST 10 - 35 U/L 20 23 -  ALT 9 - 46 U/L 20 23 -  Alk Phosphatase 40 - 115 U/L - - -  Total Bilirubin 0.2 - 1.2 mg/dL 0.6 0.5 -  Bilirubin, Direct 0.1 - 0.5 mg/dL - - -     Assessment:  #1.  Irritable bowel syndrome.  Patient symptoms are typical of IBS-D.  His symptoms are affecting quality of his life because of urgency.  He underwent colonoscopy in September 2017 and September 2018 and there was no evidence of colitis.  We will treat him with low-dose antispasmodic and see how he does.  #2.  History of colonic polyps.  He had multiple small adenomas removed in September 2017 and on follow-up colonoscopy a year later he had few more polyps removed and they were all hyperplastic.  He can therefore wait another 3 years before his next colonoscopy.    Plan:  Dicyclomine 10 mg by mouth 30 minutes before breakfast and lunch daily. Patient informed of potential side effects such as dry mouth and constipation.  If he has any side effects he can stop the medication and call office. Patient advised to call with progress report in 1 month. Office visit in 1 year. Next colonoscopy would be in December 2023.

## 2019-03-02 ENCOUNTER — Ambulatory Visit (INDEPENDENT_AMBULATORY_CARE_PROVIDER_SITE_OTHER): Payer: PPO | Admitting: Cardiovascular Disease

## 2019-03-02 ENCOUNTER — Encounter: Payer: Self-pay | Admitting: Cardiovascular Disease

## 2019-03-02 ENCOUNTER — Telehealth: Payer: Self-pay

## 2019-03-02 ENCOUNTER — Other Ambulatory Visit: Payer: Self-pay

## 2019-03-02 VITALS — BP 154/94 | HR 63 | Ht 70.5 in | Wt 322.0 lb

## 2019-03-02 DIAGNOSIS — G4733 Obstructive sleep apnea (adult) (pediatric): Secondary | ICD-10-CM | POA: Diagnosis not present

## 2019-03-02 DIAGNOSIS — Z7901 Long term (current) use of anticoagulants: Secondary | ICD-10-CM | POA: Diagnosis not present

## 2019-03-02 DIAGNOSIS — I10 Essential (primary) hypertension: Secondary | ICD-10-CM | POA: Diagnosis not present

## 2019-03-02 DIAGNOSIS — I4821 Permanent atrial fibrillation: Secondary | ICD-10-CM

## 2019-03-02 DIAGNOSIS — M25472 Effusion, left ankle: Secondary | ICD-10-CM

## 2019-03-02 DIAGNOSIS — M25471 Effusion, right ankle: Secondary | ICD-10-CM

## 2019-03-02 MED ORDER — HYDROCHLOROTHIAZIDE 25 MG PO TABS: 12.5000 mg | ORAL_TABLET | Freq: Every day | ORAL | 3 refills | Status: DC

## 2019-03-02 MED FILL — TEMAZEPAM 30 MG CAPSULE: 30 | 30 days supply | Qty: 30 | Fill #2

## 2019-03-02 MED FILL — HYDROCHLOROTHIAZIDE 25 MG T: 25 | 90 days supply | Qty: 45 | Fill #0

## 2019-03-02 NOTE — Progress Notes (Signed)
Patient ID: LENWOOD BALSAM, male   DOB: 15-Jan-1946, 73 y.o.   MRN: [id number]     HPI: KEATEN MASHEK is a 73 y.o. male who presents to the office today for a 12 month follow-up evaluation.  Mr. Packard has a history of hypertension, and remotely had taken blood pressure medications for several years but none since the last 20 years. He has a history of prior hypertension, obstructive sleep apnea, untreated due to previous intolerance to full face mask, as well as obesity.  He admits to having an intermittent irregular rhythm.  Last autumn he was scheduled to undergo elective surgery on his right foot hammertoe by Dr. Fritzi Mandes.  Surgery was canceled due to concerns for possible Mobitz type II block with possible atrial flutter.  He was seen by me for preoperative evaluation on 01/30/2015.  At that time, his ECG demonstrated atrial fibrillation with ventricular rate at approximately 100 bpm.  He was noted have small Q wave in lead 3.  He was started on Toprol 25 mg for 4 days and this was titrated up to 50 mg.  He also was started on eloquence 5 mg twice a day for anticoagulation.  A 2-D echo Doppler study on February 18 2015 revealed an ejection fraction of 55-60%.  There was moderate left ventricular hypertrophy.  The left atrium was moderately dilated.  A nuclear perfusion study done on 02/13/2015 was low risk without ischemia with only a mild apical defect, probably attenuation. He denies any chest pain.  He denies shortness of breath.  He is been unable to walk secondary to his toe abnormality.  He admits to daytime sleepiness and snoring.  He had not utilized CPAP therapy in years.  He is status post cataract surgery with lens implant.    He underwent cardioversion on March 04, 2015 and was successfully converted back to sinus rhythm.  On 04/10/2015 he was admitted with community-acquired pneumonia and was found to be back in atrial fibrillation.  He denies any episodes of chest pain. He has been on  eliquis 5 mg twice a day for anticoagulation , carvedilol 6.25 mg twice a day.  He is unaware of his rhythm being abnormal. He is not sleeping well but has not been using his CPAP. He feels fatigued.  When I saw him in March 2017 he continued to be in atrial fibrillation.  After much discussion, concerning another attempt at trying to convert into sinus rhythm versus staying in permanent atrial fibrillation he opted to stay in permanent atrial fibrillation.  He has continued to be on anticoagulation therapy.  I  saw him in October 2018 he continued to have difficulty with low back pain as well as swelling in his hands due to arthritis.  He sees Dr. Dennard Schaumann for primary care.  He denied any recent episodes of chest pain.  He denies palpitations.  He has not been successful with weight loss and his BMI has increased up to 42.3.  Laboratory in April 2018 had shown glucose increased at 107.  Lipid studies revealed a cholesterol of 134, triglycerides 103, HDL 50, and LDL 63.    I last saw him in November 2019.  At that time, he was not using CPAP.  He was not sleeping well and often would have to take naps.  He is atrial fibrillation rate was controlled on carvedilol 18.75 mg in the morning and 12.5 mg in the evening and he continued to be on Eliquis without bleeding.  During  that evaluation we discussed the possibility of reevaluating his sleep apnea.  I discussed new mask technology however he opted against doing this.  For the past year, he denies any chest pain.  He has had difficulty with low back pain which limits his walking.  He also underwent carpal tunnel surgery of his right hand.  He admits to some weight gain.  He is not been exercising during this Covid 19 pandemic.  He had laboratory in May 2020.  TSH was 0.88.  Hemoglobin hematocrit were stable at 16.8 and 48.1.  Glucose was 99.  Renal function was normal with a BUN of 13 and a creatinine of 0.88.  LFTs were normal.  Lipid studies in 2019 showed a  total cholesterol 138 LDL cholesterol 70 HDL 53 and triglycerides 73.  He presents for reevaluation.  Past Medical History:  Diagnosis Date  . Atrial fibrillation ([place])   . Bronchitis    hx  . Cataract    right eye  . DDD (degenerative disc disease), lumbosacral   . Dysrhythmia    irregular heatbeat  . Eye worm    right eye  . Gout   . History of kidney stones   . Hypertension   . Left knee DJD   . Morbid obesity with BMI of 40.0-44.9, adult ([place])   . Pneumonia    hx  . Prediabetes   . Radicular syndrome of left leg   . S/P total knee replacement    right  . Sleep apnea    can't use sleep study 10 yrs ago  . 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     Past Surgical History:  Procedure Laterality Date  . CARDIOVERSION N/A 03/04/2015   Procedure: CARDIOVERSION;  Surgeon: [name], MD;  Location: [facility];  Service: Cardiovascular;  Laterality: N/A;  . CHOLECYSTECTOMY    . COLONOSCOPY N/A 01/01/2016   Procedure: COLONOSCOPY;  Surgeon: [name], MD;  [facility];  Service: Endoscopy;  Laterality: N/A;  1:45  . COLONOSCOPY N/A 01/14/2017   Procedure: COLONOSCOPY;  Surgeon: [name], MD;  [facility];  Service: Endoscopy;  Laterality: N/A;  12:05  . EYE EXAMINATION UNDER ANESTHESIA W/ RETINAL CRYOTHERAPY AND RETINAL LASER  07/2011  . EYE SURGERY  02/2011   cat rt  . KNEE ARTHROSCOPY     bilateral  . LUMBAR LAMINECTOMY/DECOMPRESSION MICRODISCECTOMY N/A 08/18/2017   Procedure: Bilateral microlumbar decompression Lumbar four-Lumbar five;  Surgeon: [name], MD;  Location: [facility];  Service: Orthopedics;  Laterality: N/A;  . POLYPECTOMY  01/01/2016   Procedure: POLYPECTOMY;  Surgeon: [name], MD;  [facility];  Service: Endoscopy;;  colon  . RETINAL DETACHMENT SURGERY  03/2011   rt  . TOTAL KNEE ARTHROPLASTY  2003   rt  . TOTAL KNEE ARTHROPLASTY  02/07/2012   Procedure: TOTAL KNEE ARTHROPLASTY;  Surgeon: [name], MD;  Location: [facility];  Service: Orthopedics;  Laterality: Left;    No Known Allergies  Current Outpatient Medications  Medication Sig Dispense Refill  . acetaminophen (TYLENOL) 325 MG tablet Take 2 tablets (650 mg total) by mouth every 6 (six) hours as needed (or Fever >/= 101).    [name] allopurinol (ZYLOPRIM) 300 MG tablet TAKE 1 TABLET (300 MG TOTAL) BY MOUTH DAILY. 90 tablet 3  . apixaban (ELIQUIS) 5 MG TABS tablet Take 1 tablet (5 mg total) by mouth 2 (two) times daily. Resume 5 days post-op 180 tablet  3  . carvedilol (COREG) 12.5 MG tablet TAKE 1 & 1/2 TABLETS BY MOUTH EVERY MORNING AND 1 TABLET IN THE EVENING 225 tablet 0  . dicyclomine (BENTYL) 10 MG capsule Take 1 capsule (10 mg total) by mouth 2 (two) times daily before a meal. 60 capsule 5  . fluticasone (FLONASE) 50 MCG/ACT nasal spray Place 2 sprays into both nostrils daily. 16 g 6  . HYDROcodone-acetaminophen (NORCO) 5-325 MG tablet Take 1 tablet by mouth every 6 (six) hours as needed for moderate pain. 30 tablet 0  . magnesium oxide (MAG-OX) 400 MG tablet Take 400 mg by mouth every evening.     . temazepam (RESTORIL) 30 MG capsule TAKE 1 CAPSULE BY MOUTH AT BEDTIME AS NEEDED FOR SLEEP. 30 capsule 2  . hydrochlorothiazide (HYDRODIURIL) 25 MG tablet Take 0.5 tablets (12.5 mg total) by mouth daily. 45 tablet 3   No current facility-administered medications for this visit.     Social History   Socioeconomic History  . Marital status: Married    Spouse name: Not on file  . Number of children: Not on file  . Years of education: Not on file  . Highest education level: Not on file  Occupational History  . Not on file  Social Needs  . Financial resource strain: Not on file  . Food insecurity    Worry: Not on file    Inability: Not on file  . Transportation needs    Medical: Not on file    Non-medical: Not on file  Tobacco Use  . Smoking status: Former Smoker    Packs/day: 2.00    Years: 15.00    Pack years: 30.00     Types: Cigarettes    Quit date: 02/02/1992    Years since quitting: 27.0  . Smokeless tobacco: Never Used  . Tobacco comment: occ alcohol  Substance and Sexual Activity  . Alcohol use: Yes    Alcohol/week: 0.0 standard drinks    Comment: occasional  . Drug use: No  . Sexual activity: Not on file  Lifestyle  . Physical activity    Days per week: Not on file    Minutes per session: Not on file  . Stress: Not on file  Relationships  . Social Herbalist on phone: Not on file    Gets together: Not on file    Attends religious service: Not on file    Active member of club or organization: Not on file    Attends meetings of clubs or organizations: Not on file    Relationship status: Not on file  . Intimate partner violence    Fear of current or ex partner: Not on file    Emotionally abused: Not on file    Physically abused: Not on file    Forced sexual activity: Not on file  Other Topics Concern  . Not on file  Social History Narrative  . 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  He is married.   Family History  Problem Relation Age of Onset  . Lung cancer Father   . Lung cancer Sister    Family history is notable that his father died at 60 with lung CA and his mother died at 23.  She had an irregular heart rhythm.  He has 2 sisters and one is deceased secondary to cancer and 1 brother.  ROS General: Negative; No fevers,  chills, or night sweats.  Positive for morbid obesity. HEENT: He has a right eye lens  implant after cataract surgery No changes in vision or hearing, sinus congestion, difficulty swallowing Pulmonary: Negative; No cough, wheezing, shortness of breath, hemoptysis Cardiovascular: See HPI:  GI: Negative; No nausea, vomiting, diarrhea, or abdominal pain GU: Negative; No dysuria, hematuria, or difficulty voiding Musculoskeletal: History of remote right total knee replacement and left  total knee replacement.  Neck low back discomfort.  Status post right carpal tunnel surgery recent low back pain from lumbar spine Hematologic: Negative; no easy bruising, bleeding Endocrine: Negative; no heat/cold intolerance; no diabetes, Neuro: Negative; no changes in balance, headaches Skin: Negative; No rashes or skin lesions Psychiatric: Negative; No behavioral problems, depression Sleep: Positive for untreated sleep apnea; positive for snoring and hypersomnolence. Other comprehensive 14 point system review is negative   Physical Exam BP (!) 154/94   Pulse 63   Ht 5' 10.5" (1.791 m)   Wt (!) 322 lb (146.1 kg)   SpO2 93%   BMI 45.55 kg/m    Repeat blood pressure by me was 136/86  Wt Readings from Last 3 Encounters:  03/02/19 (!) 322 lb (146.1 kg)  02/27/19 (!) 319 lb 11.2 oz (145 kg)  01/05/19 (!) 323 lb (146.5 kg)   General: Alert, oriented, no distress.  Skin: normal turgor, no rashes, warm and dry HEENT: Normocephalic, atraumatic. Pupils equal round and reactive to light; sclera anicteric; extraocular muscles intact; Nose without nasal septal hypertrophy Mouth/Parynx benign; Mallinpatti scale 3 Neck: Thick neck; No JVD, no carotid bruits; normal carotid upstroke Lungs: clear to ausculatation and percussion; no wheezing or rales Chest wall: without tenderness to palpitation Heart: PMI not displaced, RRR, s1 s2 normal, 1/6 systolic murmur, no diastolic murmur, no rubs, gallops, thrills, or heaves Abdomen: soft, nontender; no hepatosplenomehaly, BS+; abdominal aorta nontender and not dilated by palpation. Back: no CVA tenderness Pulses 2+ Musculoskeletal: full range of motion, normal strength, no joint deformities Extremities: Trace Bilateral ankle edema no clubbing cyanosis, Homan's sign negative  Neurologic: grossly nonfocal; Cranial nerves grossly wnl Psychologic: Normal mood and affect   ECG (independently read by me): Atrial Fibrillation at 63  November 2019  ECG (independently read by me): Atrial fibrillation 81 bpm.  QTc interval 422 ms.  Early transition.  October 2018 ECG (independently read by me): Atrial fibrillation with ventricular rate at 79 bpm.  March 2018 ECG (independently read by me): Atrial fibrillation at 72 bpm with PVC.  September 2017 ECG (independently read by me): Atrial fibrillation with ventricular rate at 10 2 bpm.  Occasional unifocal PVCs versus a bare complex.  March 2017 ECG (independently read by me):  Atrial fibrillation at 83 bpm.  02/21/2015 ECG (independently read by me): Atrial fibrillation at 99 bpm.  01/30/2015 ECG (independently read by me): Atrial fibrillation at a approximately 90-100 bpm.  Small Q wave in lead 3.  Early transition.  LABS:  BMP Latest Ref Rng & Units 08/24/2018 10/04/2017 08/19/2017  Glucose 65 - 99 mg/dL 99 109(H) 126(H)  BUN 7 - 25 mg/dL '13 8 10  ' Creatinine 0.70 - 1.18 mg/dL 0.88 0.99 1.08  BUN/Creat Ratio 6 - 22 (calc) NOT APPLICABLE NOT APPLICABLE -  Sodium 782 - 146 mmol/L 143 141 141  Potassium 3.5 - 5.3 mmol/L 5.0 4.3 4.2  Chloride 98 - 110 mmol/L 104 102 101  CO2 20 - 32 mmol/L 31 27 32  Calcium 8.6 - 10.3 mg/dL 9.1 9.4 9.0  Hepatic Function Latest Ref Rng & Units 08/24/2018 10/04/2017 07/29/2016  Total Protein 6.1 - 8.1 g/dL 6.8 7.0 -  Albumin 3.6 - 5.1 g/dL - - 3.7  AST 10 - 35 U/L 20 23 -  ALT 9 - 46 U/L 20 23 -  Alk Phosphatase 40 - 115 U/L - - -  Total Bilirubin 0.2 - 1.2 mg/dL 0.6 0.5 -  Bilirubin, Direct 0.1 - 0.5 mg/dL - - -    CBC Latest Ref Rng & Units 08/24/2018 10/04/2017 08/19/2017  WBC 3.8 - 10.8 Thousand/uL 6.3 6.1 12.9(H)  Hemoglobin 13.2 - 17.1 g/dL 16.8 16.0 14.6  Hematocrit 38.5 - 50.0 % 48.1 47.4 44.0  Platelets 140 - 400 Thousand/uL 197 203 215   Lab Results  Component Value Date   MCV 94.1 08/24/2018   MCV 92.0 10/04/2017   MCV 95.4 08/19/2017    Lab Results  Component Value Date   TSH 0.88 08/24/2018    BNP No results found for:  BNP  ProBNP No results found for: PROBNP   Lipid Panel     Component Value Date/Time   CHOL 138 10/04/2017 0844   CHOL 147 12/05/2015 0901   TRIG 73 10/04/2017 0844   HDL 53 10/04/2017 0844   HDL 55 12/05/2015 0901   CHOLHDL 2.6 10/04/2017 0844   VLDL 21 07/23/2016 0805   LDLCALC 70 10/04/2017 0844     RADIOLOGY: No results found.  IMPRESSION:  1. Essential hypertension   2. Permanent atrial fibrillation ([place])   3. Chronic anticoagulation - Eliquis, CHADS2VASC=2   4. Morbid obesity, unspecified obesity type ([place])   5. OSA (obstructive sleep apnea)   6. Ankle edema, bilateral     ASSESSMENT AND PLAN: Mr. Brenden Rudman is a 73 year-old gentleman who has a history of morbid obesity, hypertension, obstructive sleep apnea, permanent atrial fibrillation and previously was on chronic Coumadin therapy.   Over the past year, he has not been successful for weight loss and his BMI is further increased to 45.55.  He is not active and essentially has not been walking during this COVID-19 pandemic.  He also has had issues with low back discomfort.  His blood pressure today is elevated initially was 154/94.  On repeat by me was 136/86 which still is stage I hypertension.  He does have mild trace bilateral ankle edema that is somewhat tense.  I have suggested the addition of hydrochlorothiazide 12.5 mg to his medical regimen.  He will continue his carvedilol and his present dose of 18.75 mg in the morning and 12.5 mg at night.  Atrial fibrillation rate is well controlled in the 60s.  He is now on Eliquis for anticoagulation 5 mg twice a day.  He has untreated obstructive sleep apnea.  He has no interest to pursue further evaluation.  He continues to be on allopurinol for gout and denies any recent gout flare.  He has an appointment to see his primary physician in December and repeat laboratory will be obtained.  I will see him in 1 year for reevaluation.   Time spent: 25 minutes [name], MD, [facility]  03/02/2019 2:18 PM

## 2019-03-02 NOTE — Patient Instructions (Addendum)
Medication Instructions:  Your physician has recommended you make the following change in your medication:   START HYDROCHLOROTHIAZIDE 12.5 MG BY MOUTH. YOU MAY TAKE HYDROCHLOROTHIAZIDE EVERY OTHER DAY AS NEEDED AND INCREASE TO DAILY IF NEEDED  *If you need a refill on your cardiac medications before your next appointment, please call your pharmacy*  Lab Work: none If you have labs (blood work) drawn today and your tests are completely normal, you will receive your results only by: [name] MyChart Message (if you have MyChart) OR . A paper copy in the mail If you have any lab test that is abnormal or we need to change your treatment, we will call you to review the results.  Testing/Procedures: none  Follow-Up: At [facility], you and your health needs are our priority.  As part of our continuing mission to provide you with exceptional heart care, we have created designated Provider Care Teams.  These Care Teams include your primary Cardiologist (physician) and Advanced Practice Providers (APPs -  Physician Assistants and Nurse Practitioners) who all work together to provide you with the care you need, when you need it.  Your next appointment:   12 months  The format for your next appointment:   In Person  Provider:   You may see Shelva Majestic, MD or one of the following Advanced Practice Providers on your designated Care Team:    Almyra Deforest, PA-C  Fabian Sharp, PA-C or   Roby Lofts, [place]

## 2019-03-02 NOTE — Telephone Encounter (Signed)
Pt left office before receiving avs. 03/02/2019 avs mailed to pt address on file

## 2019-03-05 ENCOUNTER — Other Ambulatory Visit: Payer: Self-pay | Admitting: *Deleted

## 2019-03-08 MED FILL — AMOXICILLIN 500 MG CAPSULE: 500 | 3 days supply | Qty: 12 | Fill #0

## 2019-03-09 ENCOUNTER — Other Ambulatory Visit: Payer: Self-pay

## 2019-03-30 ENCOUNTER — Other Ambulatory Visit: Payer: Self-pay | Admitting: Cardiovascular Disease

## 2019-03-30 ENCOUNTER — Other Ambulatory Visit: Payer: Self-pay | Admitting: Family Medicine

## 2019-03-30 MED FILL — ALLOPURINOL 300 MG TABS: 300 | 90 days supply | Qty: 90 | Fill #1

## 2019-03-30 NOTE — Telephone Encounter (Signed)
Ok to refill??  Last office visit 01/05/2019.  Last refill 01/02/2019.

## 2019-04-02 ENCOUNTER — Other Ambulatory Visit: Payer: Self-pay | Admitting: Cardiovascular Disease

## 2019-04-02 MED FILL — TEMAZEPAM 30 MG CAPSULE: 30 | 30 days supply | Qty: 30 | Fill #0

## 2019-04-02 NOTE — Telephone Encounter (Signed)
New Message     *STAT* If patient is at the pharmacy, call can be transferred to refill team.   1. Which medications need to be refilled? (please list name of each medication and dose if known) carvedilol (COREG) 12.5 MG tablet  2. Which pharmacy/location (including street and city if local pharmacy) is medication to be sent to? [facility], [place].  3. Do they need a 30 day or 90 day supply? [facility]

## 2019-04-03 ENCOUNTER — Encounter: Payer: PPO | Admitting: Family Medicine

## 2019-04-03 MED ORDER — CARVEDILOL 12.5 MG PO TABS: ORAL_TABLET | ORAL | 3 refills | Status: DC

## 2019-04-03 MED FILL — CARVEDILOL 12.5 MG TABLET: 12.5 | 90 days supply | Qty: 225 | Fill #0

## 2019-04-03 NOTE — Telephone Encounter (Signed)
Follow Up  Pt has not received medication per previous message

## 2019-04-03 NOTE — Telephone Encounter (Signed)
Rx has been sent to the pharmacy electronically. ° °

## 2019-04-03 NOTE — Telephone Encounter (Signed)
Incoming call from Scheduling. Pt wife calling about pt Rx refill for carvedilol 12.5 mg that she states pt pharmacy has not yet received. She states she contacted [facility] yesterday and was told [facility] had not sent any Rx refills for pt. Rx refill for carvedilol 12.5 mg sent to pt pharmacy via e-prescribe.  [place] to verify receipt. Pharmacy rep confirmed that Rx for carvedilol 12.5 mg was received today.  Pt wife updated and she verbalized understanding.

## 2019-04-22 ENCOUNTER — Other Ambulatory Visit: Payer: Self-pay | Admitting: Family Medicine

## 2019-04-22 DIAGNOSIS — J019 Acute sinusitis, unspecified: Secondary | ICD-10-CM

## 2019-04-30 ENCOUNTER — Telehealth: Payer: Self-pay

## 2019-04-30 IMAGING — CR DG HIP (WITH OR WITHOUT PELVIS) 5+V BILAT
4 series · 4 of 4 positions shown · non-contrast
Comparison: None.

CLINICAL DATA: Chronic worsening bilateral hip pain.

EXAM:
DG HIP (WITH OR WITHOUT PELVIS) 5+V BILAT

[w hip ap left]
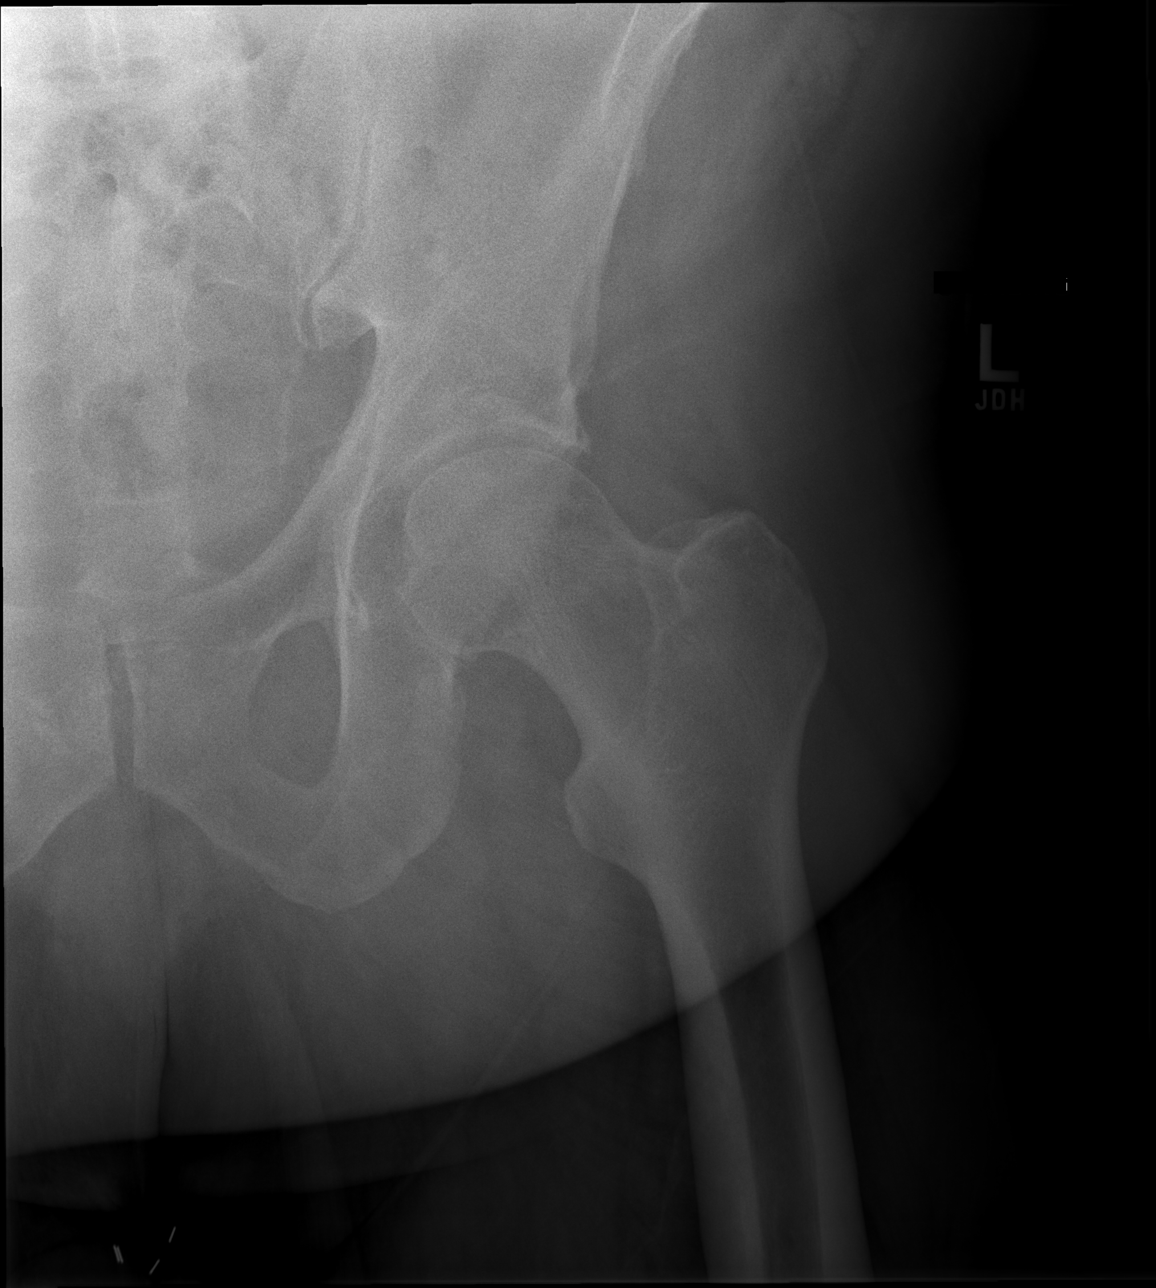

[w hip lat left]
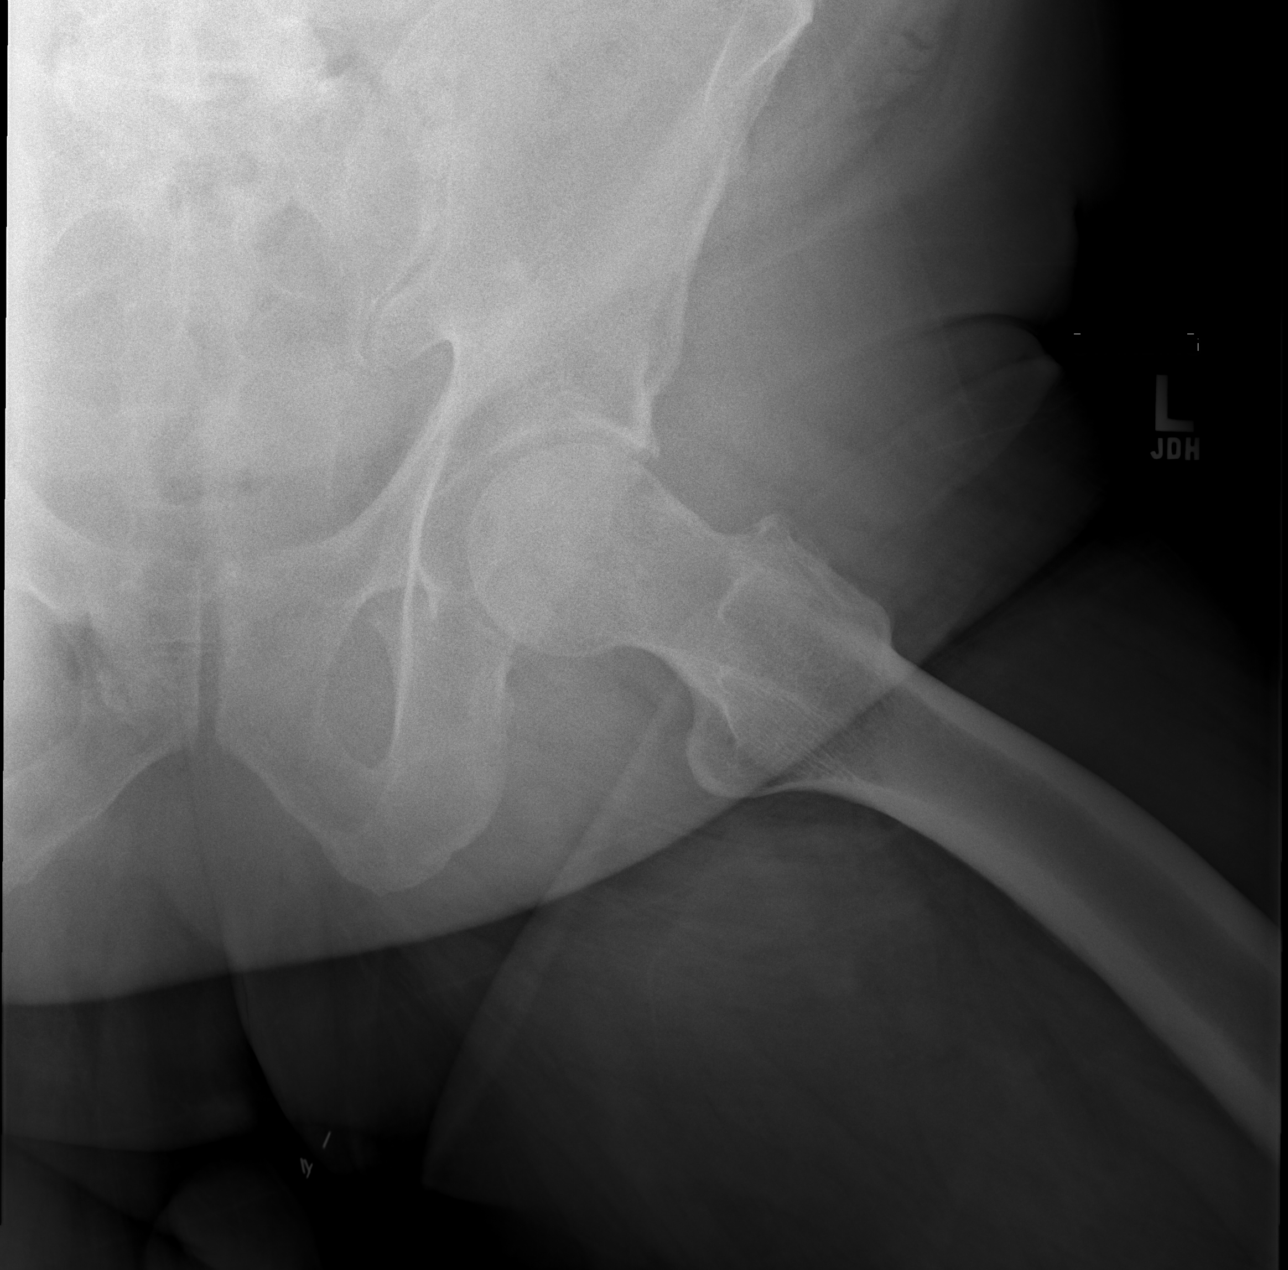

[w hip ap right]
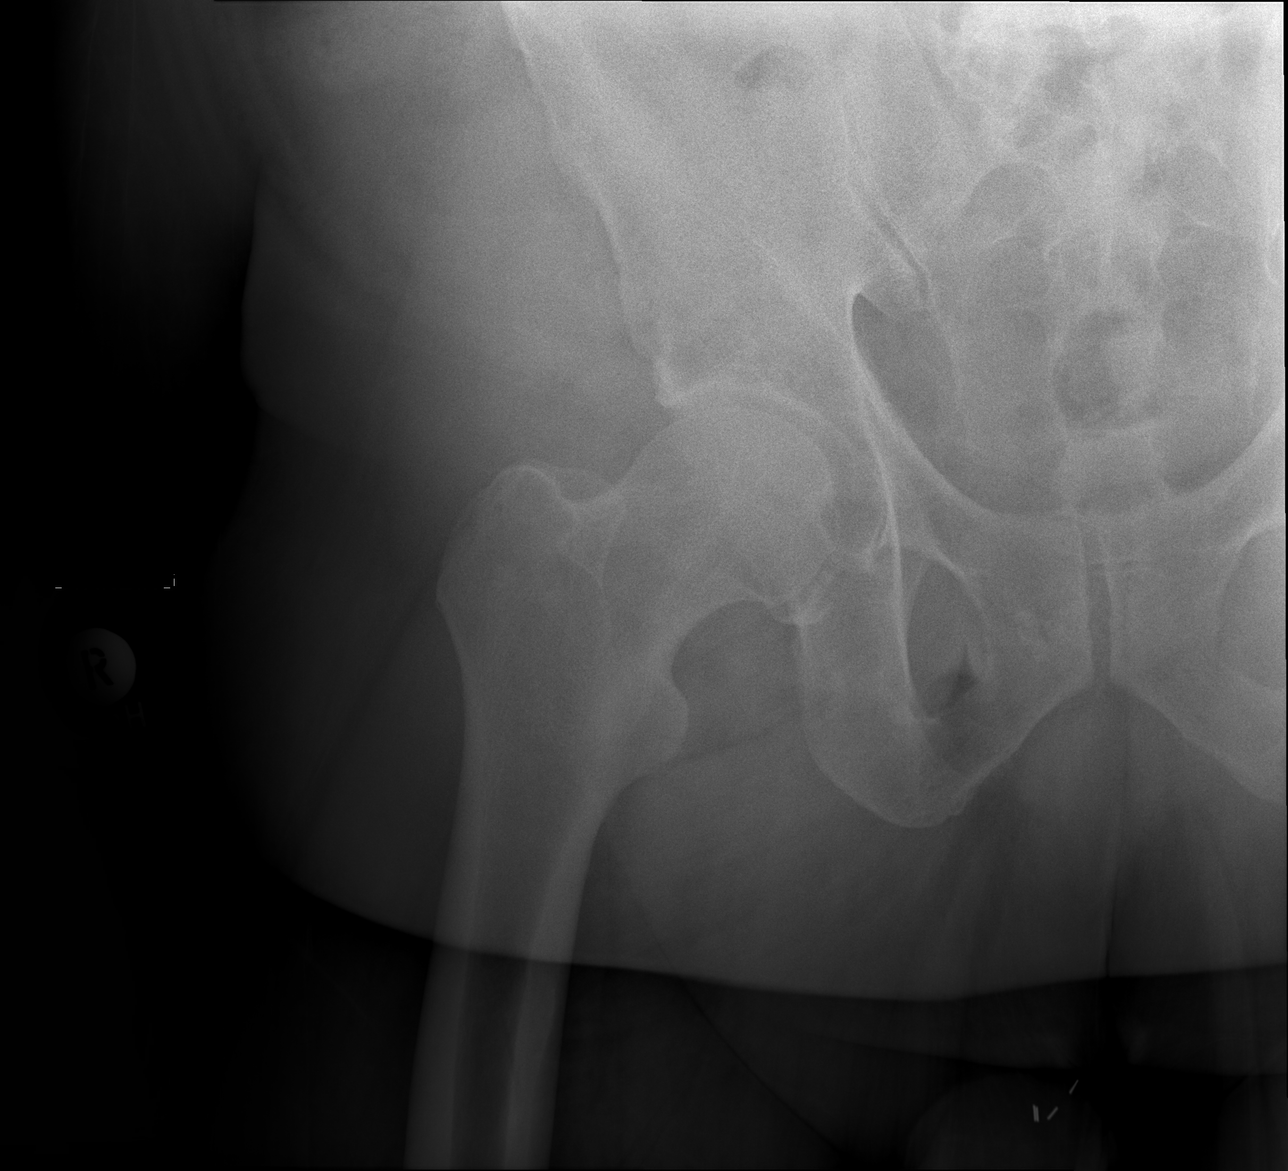

[w hip lat right]
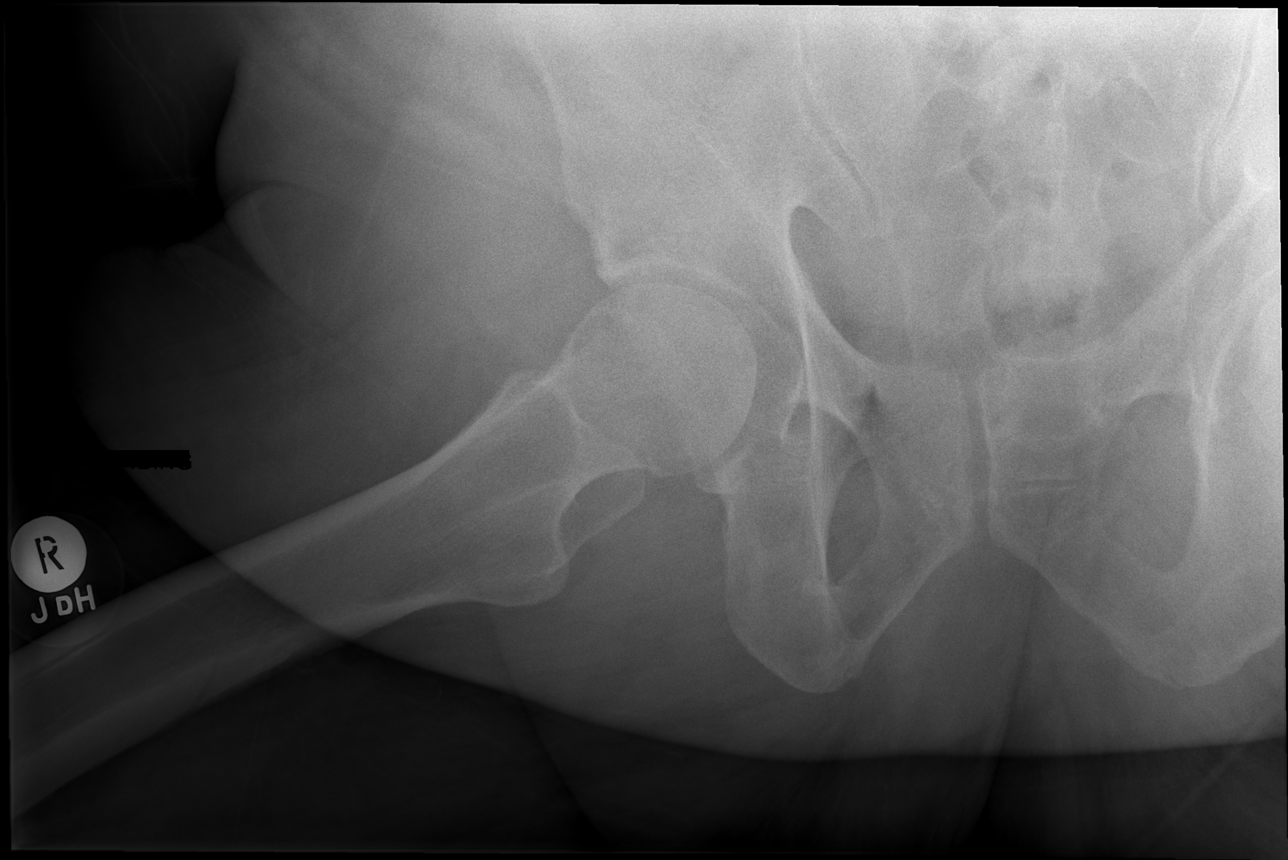

[4 of 4 positions shown; findings below may reference images not displayed]

FINDINGS: Two views of each hip are provided.

Osseous alignment is normal bilaterally. Bone mineralization is
normal. No fracture line or displaced fracture fragment seen. No
acute or suspicious osseous lesion. No significant degenerative
change at either hip joint. Soft tissues about the hips are
unremarkable.
IMPRESSION: Negative.

## 2019-04-30 NOTE — Telephone Encounter (Signed)
Pt's wife came by looking for a consent form for pt to get the covid vaccine. Do you know anything about this? Please advise.

## 2019-04-30 NOTE — Telephone Encounter (Signed)
Letter done

## 2019-05-03 ENCOUNTER — Other Ambulatory Visit: Payer: Self-pay | Admitting: Cardiovascular Disease

## 2019-05-03 MED FILL — TEMAZEPAM 30 MG CAPSULE: 30 | 30 days supply | Qty: 30 | Fill #1

## 2019-05-14 ENCOUNTER — Other Ambulatory Visit: Payer: Self-pay | Admitting: Family Medicine

## 2019-05-14 MED FILL — CLOTRIMAZOLE 10 MG TROCHE: 10 | 14 days supply | Qty: 70 | Fill #0

## 2019-06-01 MED FILL — TEMAZEPAM 30 MG CAPSULE: 30 | 30 days supply | Qty: 30 | Fill #2

## 2019-06-01 MED FILL — AMOXICILLIN 500 MG CAPSULE: 500 | 3 days supply | Qty: 12 | Fill #0

## 2019-06-04 ENCOUNTER — Ambulatory Visit
Admission: RE | Admit: 2019-06-04 | Discharge: 2019-06-04 | Disposition: A | Discharge status: Home / Self Care | Payer: PPO | Source: Ambulatory Visit | Attending: Family Medicine | Admitting: Family Medicine

## 2019-06-04 ENCOUNTER — Encounter: Payer: Self-pay | Admitting: Family Medicine

## 2019-06-04 ENCOUNTER — Ambulatory Visit (INDEPENDENT_AMBULATORY_CARE_PROVIDER_SITE_OTHER): Payer: PPO | Admitting: Family Medicine

## 2019-06-04 ENCOUNTER — Other Ambulatory Visit: Payer: Self-pay

## 2019-06-04 VITALS — BP 152/86 | HR 62 | Temp 97.8°F | Resp 20 | Ht 70.5 in | Wt 319.0 lb

## 2019-06-04 DIAGNOSIS — Z0000 Encounter for general adult medical examination without abnormal findings: Secondary | ICD-10-CM

## 2019-06-04 DIAGNOSIS — R05 Cough: Secondary | ICD-10-CM

## 2019-06-04 DIAGNOSIS — M48061 Spinal stenosis, lumbar region without neurogenic claudication: Secondary | ICD-10-CM

## 2019-06-04 DIAGNOSIS — I4819 Other persistent atrial fibrillation: Secondary | ICD-10-CM

## 2019-06-04 DIAGNOSIS — G4700 Insomnia, unspecified: Secondary | ICD-10-CM | POA: Diagnosis not present

## 2019-06-04 DIAGNOSIS — M7989 Other specified soft tissue disorders: Secondary | ICD-10-CM

## 2019-06-04 DIAGNOSIS — R053 Chronic cough: Secondary | ICD-10-CM

## 2019-06-04 DIAGNOSIS — Z125 Encounter for screening for malignant neoplasm of prostate: Secondary | ICD-10-CM | POA: Diagnosis not present

## 2019-06-04 DIAGNOSIS — I10 Essential (primary) hypertension: Secondary | ICD-10-CM

## 2019-06-04 NOTE — Progress Notes (Signed)
Subjective:    Patient ID: Garrett Munoz, male    DOB: 12/23/1945, 74 y.o.   MRN: [id number]  HPI Patient is a 74 year old Caucasian male here today for a complete physical exam.  He denies any depression.  He denies any falls.  He denies any memory loss.  However both he and his wife report a chronic cough.  He states that he has had this cough for more than a year possibly even 2 years.  He is now starting to get dyspnea on exertion, orthopnea, and a cough worse in the morning.  He reports chest congestion and a productive cough first thing in the morning when he wakes up.  He has a 30-pack-year history of smoking however he quit smoking more than 20 years ago.  He does however report wheezing.  I perform pulmonary function test today in the office.  FEV1 was 1.12 L which is 35% of predicted.  FVC is 1.48 L which is 36% of predicted.  FEV1 to FVC ratio was 76%.  Therefore the patient has markedly diminished lung volumes however pattern does not indicate obstructive lung disease.  I would still suspect COPD based on smoking history.  Patient has used nebulizers under his wife's supervision and has seen benefit after using it.  However on exam today, the patient has +1 pitting edema in both legs distal to the knee.  He also has bibasilar crackles in both lung fields suggesting possible pulmonary edema.  Last echocardiogram was several years ago and at that time he had an ejection fraction of 55 to 60%.  However given his morbid obesity, I suspect patient would be at high risk for diastolic dysfunction.  Colonoscopy is up-to-date however he is due for prostate cancer screening.  Immunizations are up-to-date as shown below: Immunization History  Administered Date(s) Administered  . Fluad Quad(high Dose 65+) 12/20/2018  . Influenza, High Dose Seasonal PF 01/04/2017  . Influenza,inj,Quad PF,6+ Mos 01/04/2018  . Influenza-Unspecified 01/18/2016, 01/24/2017  . Moderna SARS-COVID-2 Vaccination 05/28/2019   . Pneumococcal Conjugate-13 01/04/2017, 02/17/2017  . Pneumococcal-Unspecified 02/17/2017  . Zoster Recombinat (Shingrix) 12/07/2017, 02/13/2018    Past Medical History:  Diagnosis Date  . Atrial fibrillation ([place])   . Bronchitis    hx  . Cataract    right eye  . DDD (degenerative disc disease), lumbosacral   . Dysrhythmia    irregular heatbeat  . Eye worm    right eye  . Gout   . History of kidney stones   . Hypertension   . Left knee DJD   . Morbid obesity with BMI of 40.0-44.9, adult ([place]   . Pneumonia    hx  . Prediabetes   . Radicular syndrome of left leg   . S/P total knee replacement    right  . Sleep apnea    can't use sleep study 10 yrs ago  . 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    Past Surgical History:  Procedure Laterality Date  . CARDIOVERSION N/A 03/04/2015   Procedure: CARDIOVERSION;  Surgeon: [name], MD;  Location: [facility];  Service: Cardiovascular;  Laterality: N/A;  . CHOLECYSTECTOMY    . COLONOSCOPY N/A 01/01/2016   Procedure: COLONOSCOPY;  Surgeon: [name], MD;  [facility];  Service: Endoscopy;  Laterality: N/A;  1:45  . COLONOSCOPY N/A 01/14/2017   Procedure: COLONOSCOPY;  Surgeon: [name], MD;  [facility];  Service: Endoscopy;  Laterality: N/A;  12:05  .  EYE EXAMINATION UNDER ANESTHESIA W/ RETINAL CRYOTHERAPY AND RETINAL LASER  07/2011  . EYE SURGERY  02/2011   cat rt  . KNEE ARTHROSCOPY     bilateral  . LUMBAR LAMINECTOMY/DECOMPRESSION MICRODISCECTOMY N/A 08/18/2017   Procedure: Bilateral microlumbar decompression Lumbar four-Lumbar five;  Surgeon: [name], MD;  Location: [place];  Service: Orthopedics;  Laterality: N/A;  . POLYPECTOMY  01/01/2016   Procedure: POLYPECTOMY;  Surgeon: [name], MD;  [facility];  Service: Endoscopy;;  colon  . RETINAL DETACHMENT SURGERY  03/2011   rt  . TOTAL KNEE ARTHROPLASTY  2003   rt  . TOTAL KNEE ARTHROPLASTY  02/07/2012   Procedure:  TOTAL KNEE ARTHROPLASTY;  Surgeon: [name], MD;  Location: [facility];  Service: Orthopedics;  Laterality: Left;   Current Outpatient Medications on File Prior to Visit  Medication Sig Dispense Refill  . acetaminophen (TYLENOL) 325 MG tablet Take 2 tablets (650 mg total) by mouth every 6 (six) hours as needed (or Fever >/= 101).    [name] allopurinol (ZYLOPRIM) 300 MG tablet TAKE 1 TABLET (300 MG TOTAL) BY MOUTH DAILY. 90 tablet 3  . apixaban (ELIQUIS) 5 MG TABS tablet Take 1 tablet (5 mg total) by mouth 2 (two) times daily. Resume 5 days post-op 180 tablet 3  . carvedilol (COREG) 12.5 MG tablet TAKE 1 & 1/2 TABLETS BY MOUTH EVERY MORNING AND 1 TABLET IN THE EVENING 225 tablet 3  . clotrimazole (MYCELEX) 10 MG troche DISSOLVE 1 TROCHE BY MOUTH 5 TIMES DAILY 70 Troche 1  . dicyclomine (BENTYL) 10 MG capsule Take 1 capsule (10 mg total) by mouth 2 (two) times daily before a meal. 60 capsule 5  . fluticasone (FLONASE) 50 MCG/ACT nasal spray SHAKE LIQUID AND USE 2 SPRAYS IN EACH NOSTRIL DAILY 16 g 6  . hydrochlorothiazide (HYDRODIURIL) 25 MG tablet Take 0.5 tablets (12.5 mg total) by mouth daily. 45 tablet 3  . HYDROcodone-acetaminophen (NORCO) 5-325 MG tablet Take 1 tablet by mouth every 6 (six) hours as needed for moderate pain. 30 tablet 0  . magnesium oxide (MAG-OX) 400 MG tablet Take 400 mg by mouth every evening.     . temazepam (RESTORIL) 30 MG capsule TAKE 1 CAPSULE BY MOUTH AT BEDTIME AS NEEDED FOR SLEEP. 30 capsule 2   No current facility-administered medications on file prior to visit.   No Known Allergies Social History   Socioeconomic History  . Marital status: Married    Spouse name: Not on file  . Number of children: Not on file  . Years of education: Not on file  . Highest education level: Not on file  Occupational History  . Not on file  Tobacco Use  . Smoking status: Former Smoker    Packs/day: 2.00    Years: 15.00    Pack years: 30.00    Types: Cigarettes    Quit date:  02/02/1992    Years since quitting: 27.3  . Smokeless tobacco: Never Used  . Tobacco comment: occ alcohol  Substance and Sexual Activity  . Alcohol use: Yes    Alcohol/week: 0.0 standard drinks    Comment: occasional  . Drug use: No  . Sexual activity: Not on file  Other Topics Concern  . Not on file  Social History Narrative  . Not on file   Social Determinants of Health   Financial Resource Strain:   . Difficulty of Paying Living Expenses: Not on file  Food Insecurity:   . Worried  About Running Out of Food in the Last Year: Not on file  . Ran Out of Food in the Last Year: Not on file  Transportation Needs:   . Lack of Transportation (Medical): Not on file  . Lack of Transportation (Non-Medical): Not on file  Physical Activity:   . Days of Exercise per Week: Not on file  . Minutes of Exercise per Session: Not on file  Stress:   . Feeling of Stress : Not on file  Social Connections:   . Frequency of Communication with Friends and Family: Not on file  . Frequency of Social Gatherings with Friends and Family: Not on file  . Attends Religious Services: Not on file  . Active Member of Clubs or Organizations: Not on file  . Attends Archivist Meetings: Not on file  . Marital Status: Not on file  Intimate Partner Violence:   . Fear of Current or Ex-Partner: Not on file  . Emotionally Abused: Not on file  . Physically Abused: Not on file  . Sexually Abused: Not on file      Review of Systems  All other systems reviewed and are negative.      Objective:   Physical Exam  Constitutional: He is oriented to person, place, and time. He appears well-developed and well-nourished. No distress.  HENT:  Right Ear: External ear normal.  Left Ear: External ear normal.  Nose: Nose normal.  Mouth/Throat: Oropharynx is clear and moist. No oropharyngeal exudate.  Neck: No JVD present. No tracheal deviation present. No thyromegaly present.  Cardiovascular: Normal rate and  normal heart sounds. An irregularly irregular rhythm present.  Pulmonary/Chest: Effort normal. He has no wheezes. He has rales. He exhibits no tenderness.  Abdominal: Soft. Bowel sounds are normal. He exhibits distension. He exhibits no mass. There is no abdominal tenderness. There is no rebound and no guarding.  Musculoskeletal:        General: Edema present.     Cervical back: Neck supple.     Lumbar back: Pain and tenderness present. No deformity. Decreased range of motion.     Right lower leg: Edema present.     Left lower leg: Edema present.  Lymphadenopathy:    He has no cervical adenopathy.  Neurological: He is alert and oriented to person, place, and time. He displays normal reflexes. No cranial nerve deficit. He exhibits normal muscle tone. Coordination normal.  Skin: No rash noted. He is not diaphoretic. No erythema. No pallor.  Psychiatric: He has a normal mood and affect. His behavior is normal. Judgment and thought content normal.  Vitals reviewed.         Assessment & Plan:  Benign essential HTN - Plan: CBC with Differential/Platelet, COMPLETE METABOLIC PANEL WITH GFR, Lipid panel  Prostate cancer screening - Plan: PSA  Chronic cough - Plan: DG Chest 2 View  Leg swelling - Plan: Brain natriuretic peptide  Persistent atrial fibrillation (HCC)  Insomnia, unspecified type  Spinal stenosis at L4-L5 level  I am concerned that the patient has COPD based on his history as well as his pulmonary function test.  This would suggest possible chronic bronchitis causing the daily cough to productive cough in the morning.  Patient would likely benefit from Trelegy or Anoro.  However his exam today suggest possible pulmonary edema and fluid overload secondary to diastolic dysfunction.  I will check a chest x-ray as well as a BNP.  If his chest x-ray shows pulmonary edema and his BNP is elevated,  I would also start the patient on Lasix.  I believe the combination of Lasix and  combined long-acting bronchodilators will help the patient's cough and breathing.  I will also use a chest x-ray to rule out other potential causes of chronic cough.  His blood pressure today is elevated however he states that his blood pressure at home is in the 120-130/80 range which is controlled.  I will check a fasting lipid panel.  Screen for prostate cancer with a PSA.  Immunizations are up-to-date.  He denies any falls, depression, or memory loss.

## 2019-06-05 LAB — PSA: PSA: 0.8 ng/mL (ref <=4.0)

## 2019-06-05 LAB — CBC WITH DIFFERENTIAL/PLATELET
Absolute Monocytes: 573 cells/uL (ref 200–950)
Basophils Absolute: 62 cells/uL (ref 0–200)
Basophils Relative: 0.9 %
Eosinophils Absolute: 621 cells/uL — ABNORMAL HIGH (ref 15–500)
Eosinophils Relative: 9 %
HCT: 50.6 % — ABNORMAL HIGH (ref 38.5–50.0)
Hemoglobin: 17 g/dL (ref 13.2–17.1)
Lymphs Abs: 1939 cells/uL (ref 850–3900)
MCH: 32 pg (ref 27.0–33.0)
MCHC: 33.6 g/dL (ref 32.0–36.0)
MCV: 95.3 fL (ref 80.0–100.0)
MPV: 11.7 fL (ref 7.5–12.5)
Monocytes Relative: 8.3 %
Neutro Abs: 3705 cells/uL (ref 1500–7800)
Neutrophils Relative %: 53.7 %
Platelets: 172 10*3/uL (ref 140–400)
RBC: 5.31 10*6/uL (ref 4.20–5.80)
RDW: 13.6 % (ref 11.0–15.0)
Total Lymphocyte: 28.1 %
WBC: 6.9 10*3/uL (ref 3.8–10.8)

## 2019-06-05 LAB — COMPLETE METABOLIC PANEL WITH GFR
AG Ratio: 1.4 (calc) (ref 1.0–2.5)
ALT: 17 U/L (ref 9–46)
AST: 15 U/L (ref 10–35)
Albumin: 3.9 g/dL (ref 3.6–5.1)
Alkaline phosphatase (APISO): 68 U/L (ref 35–144)
BUN: 11 mg/dL (ref 7–25)
CO2: 32 mmol/L (ref 20–32)
Calcium: 9.3 mg/dL (ref 8.6–10.3)
Chloride: 104 mmol/L (ref 98–110)
Creat: 0.92 mg/dL (ref 0.70–1.18)
GFR, Est African American: 94 mL/min/{1.73_m2} (ref >=60)
GFR, Est Non African American: 81 mL/min/{1.73_m2} (ref >=60)
Globulin: 2.8 g/dL (calc) (ref 1.9–3.7)
Glucose, Bld: 95 mg/dL (ref 65–99)
Potassium: 5 mmol/L (ref 3.5–5.3)
Sodium: 144 mmol/L (ref 135–146)
Total Bilirubin: 0.5 mg/dL (ref 0.2–1.2)
Total Protein: 6.7 g/dL (ref 6.1–8.1)

## 2019-06-05 LAB — LIPID PANEL
Cholesterol: 122 mg/dL (ref <200)
HDL: 52 mg/dL (ref >=40)
LDL Cholesterol (Calc): 55 mg/dL (calc)
Non-HDL Cholesterol (Calc): 70 mg/dL (calc) (ref <130)
Total CHOL/HDL Ratio: 2.3 (calc) (ref <5.0)
Triglycerides: 66 mg/dL (ref <150)

## 2019-06-05 LAB — BRAIN NATRIURETIC PEPTIDE: Brain Natriuretic Peptide: 102 pg/mL — ABNORMAL HIGH (ref <100)

## 2019-06-06 ENCOUNTER — Encounter: Payer: Self-pay | Admitting: Family Medicine

## 2019-06-29 ENCOUNTER — Other Ambulatory Visit: Payer: Self-pay | Admitting: Family Medicine

## 2019-06-29 MED FILL — ALLOPURINOL 300 MG TABS: 300 | 90 days supply | Qty: 90 | Fill #2

## 2019-06-29 MED FILL — TEMAZEPAM 30 MG CAPSULE: 30 | 30 days supply | Qty: 30 | Fill #0

## 2019-06-29 MED FILL — CARVEDILOL 12.5 MG TABLET: 12.5 | 90 days supply | Qty: 225 | Fill #1

## 2019-06-29 NOTE — Telephone Encounter (Signed)
Ok to refill??  Last office visit 06/04/2019.  Last refill 04/01/2019, #2 refills.

## 2019-07-03 ENCOUNTER — Other Ambulatory Visit: Payer: Self-pay | Admitting: Pharmacist

## 2019-07-03 MED ORDER — APIXABAN 5 MG PO TABS: 5.0000 mg | ORAL_TABLET | Freq: Two times a day (BID) | ORAL | 1 refills | Status: DC

## 2019-07-03 MED FILL — ELIQUIS 5 MG TABLET: 5 | 90 days supply | Qty: 180 | Fill #0

## 2019-07-05 DIAGNOSIS — M545 Low back pain: Secondary | ICD-10-CM | POA: Diagnosis not present

## 2019-07-05 DIAGNOSIS — M5137 Other intervertebral disc degeneration, lumbosacral region: Secondary | ICD-10-CM | POA: Diagnosis not present

## 2019-07-05 DIAGNOSIS — I4891 Unspecified atrial fibrillation: Secondary | ICD-10-CM | POA: Diagnosis not present

## 2019-07-05 DIAGNOSIS — M5380 Other specified dorsopathies, site unspecified: Secondary | ICD-10-CM | POA: Diagnosis not present

## 2019-07-05 DIAGNOSIS — M5136 Other intervertebral disc degeneration, lumbar region: Secondary | ICD-10-CM | POA: Diagnosis not present

## 2019-07-05 DIAGNOSIS — M533 Sacrococcygeal disorders, not elsewhere classified: Secondary | ICD-10-CM | POA: Diagnosis not present

## 2019-07-05 DIAGNOSIS — M48061 Spinal stenosis, lumbar region without neurogenic claudication: Secondary | ICD-10-CM | POA: Diagnosis not present

## 2019-07-05 MED FILL — predniSONE 5 MG TABS: 5 | 6 days supply | Qty: 21 | Fill #0

## 2019-07-30 MED FILL — TEMAZEPAM 30 MG CAPSULE: 30 | 30 days supply | Qty: 30 | Fill #1

## 2019-08-13 ENCOUNTER — Emergency Department (HOSPITAL_COMMUNITY)
Admission: EM | Admit: 2019-08-13 | Discharge: 2019-08-13 | Disposition: A | Discharge status: Home / Self Care | Payer: PPO | Attending: Emergency Medicine | Admitting: Emergency Medicine

## 2019-08-13 ENCOUNTER — Emergency Department (HOSPITAL_COMMUNITY): Payer: PPO

## 2019-08-13 ENCOUNTER — Other Ambulatory Visit: Payer: Self-pay

## 2019-08-13 DIAGNOSIS — G8929 Other chronic pain: Secondary | ICD-10-CM | POA: Insufficient documentation

## 2019-08-13 DIAGNOSIS — Z87891 Personal history of nicotine dependence: Secondary | ICD-10-CM | POA: Diagnosis not present

## 2019-08-13 DIAGNOSIS — I4891 Unspecified atrial fibrillation: Secondary | ICD-10-CM | POA: Diagnosis not present

## 2019-08-13 DIAGNOSIS — N281 Cyst of kidney, acquired: Secondary | ICD-10-CM | POA: Diagnosis not present

## 2019-08-13 DIAGNOSIS — R197 Diarrhea, unspecified: Secondary | ICD-10-CM | POA: Diagnosis present

## 2019-08-13 DIAGNOSIS — M545 Low back pain: Secondary | ICD-10-CM | POA: Diagnosis not present

## 2019-08-13 DIAGNOSIS — N400 Enlarged prostate without lower urinary tract symptoms: Secondary | ICD-10-CM | POA: Diagnosis not present

## 2019-08-13 DIAGNOSIS — Z79899 Other long term (current) drug therapy: Secondary | ICD-10-CM | POA: Insufficient documentation

## 2019-08-13 DIAGNOSIS — K529 Noninfective gastroenteritis and colitis, unspecified: Secondary | ICD-10-CM | POA: Diagnosis not present

## 2019-08-13 DIAGNOSIS — I10 Essential (primary) hypertension: Secondary | ICD-10-CM | POA: Insufficient documentation

## 2019-08-13 LAB — COMPREHENSIVE METABOLIC PANEL
ALT: 24 U/L (ref 0–44)
AST: 21 U/L (ref 15–41)
Albumin: 3.9 g/dL (ref 3.5–5.0)
Alkaline Phosphatase: 65 U/L (ref 38–126)
Anion gap: 9 (ref 5–15)
BUN: 10 mg/dL (ref 8–23)
CO2: 29 mmol/L (ref 22–32)
Calcium: 9.1 mg/dL (ref 8.9–10.3)
Chloride: 102 mmol/L (ref 98–111)
Creatinine, Ser: 0.99 mg/dL (ref 0.61–1.24)
GFR calc Af Amer: >60 mL/min (ref >60)
GFR calc non Af Amer: >60 mL/min (ref >60)
Glucose, Bld: 125 mg/dL — ABNORMAL HIGH (ref 70–99)
Potassium: 4.3 mmol/L (ref 3.5–5.1)
Sodium: 140 mmol/L (ref 135–145)
Total Bilirubin: 1.2 mg/dL (ref 0.3–1.2)
Total Protein: 7.4 g/dL (ref 6.5–8.1)

## 2019-08-13 LAB — CBC
HCT: 53.7 % — ABNORMAL HIGH (ref 39.0–52.0)
Hemoglobin: 17.6 g/dL — ABNORMAL HIGH (ref 13.0–17.0)
MCH: 32.4 pg (ref 26.0–34.0)
MCHC: 32.8 g/dL (ref 30.0–36.0)
MCV: 98.9 fL (ref 80.0–100.0)
Platelets: 167 10*3/uL (ref 150–400)
RBC: 5.43 MIL/uL (ref 4.22–5.81)
RDW: 14.4 % (ref 11.5–15.5)
WBC: 7.6 10*3/uL (ref 4.0–10.5)
nRBC: 0 % (ref 0.0–0.2)

## 2019-08-13 LAB — LIPASE, BLOOD: Lipase: 26 U/L (ref 11–51)

## 2019-08-13 MED ORDER — ONDANSETRON HCL 4 MG/2ML IJ SOLN
4.0000 mg | Freq: Once | INTRAMUSCULAR | Status: AC
  Administered 2019-08-13: 4 mg via INTRAVENOUS
  Filled 2019-08-13: qty 2

## 2019-08-13 MED ORDER — SODIUM CHLORIDE 0.9 % IV BOLUS
1000.0000 mL | Freq: Once | INTRAVENOUS | Status: AC
  Administered 2019-08-13: 13:00:00 1000 mL via INTRAVENOUS

## 2019-08-13 MED ORDER — LOPERAMIDE HCL 2 MG PO CAPS
4.0000 mg | ORAL_CAPSULE | Freq: Once | ORAL | Status: AC
  Administered 2019-08-13: 4 mg via ORAL
  Filled 2019-08-13: qty 2

## 2019-08-13 MED ORDER — PROMETHAZINE HCL 25 MG PO TABS
25.0000 mg | ORAL_TABLET | Freq: Four times a day (QID) | ORAL | 0 refills | Status: DC | PRN
Start: 2019-08-13 — End: 2020-03-09

## 2019-08-13 MED ORDER — LOPERAMIDE HCL 2 MG PO CAPS
4.0000 mg | ORAL_CAPSULE | Freq: Four times a day (QID) | ORAL | 0 refills | Status: DC | PRN
Start: 2019-08-13 — End: 2020-03-09

## 2019-08-13 MED ORDER — SODIUM CHLORIDE 0.9% FLUSH
3.0000 mL | Freq: Once | INTRAVENOUS | Status: AC
  Administered 2019-08-13: 3 mL via INTRAVENOUS

## 2019-08-13 MED ORDER — PROMETHAZINE HCL 25 MG PO TABS
25.0000 mg | ORAL_TABLET | Freq: Four times a day (QID) | ORAL | 0 refills | Status: DC | PRN
Start: 2019-08-13 — End: 2019-08-13

## 2019-08-13 MED ORDER — IOHEXOL 300 MG/ML  SOLN
100.0000 mL | Freq: Once | INTRAMUSCULAR | Status: AC | PRN
  Administered 2019-08-13: 100 mL via INTRAVENOUS

## 2019-08-13 MED FILL — PROMETHAZINE 25 MG TABLET: 25 | 3 days supply | Qty: 10 | Fill #0

## 2019-08-13 MED FILL — LOPERAMIDE 2 MG CAPSULE: 2 | 2 days supply | Qty: 12 | Fill #0

## 2019-08-13 NOTE — ED Notes (Signed)
Pt discharge instructions and prescriptions reviewed with the patient. The patient verbalized understanding of both. Pt discharged. 

## 2019-08-13 NOTE — Discharge Instructions (Signed)
Return here as needed.  Slowly increase your fluid intake.  Follow-up with your primary doctor.

## 2019-08-13 NOTE — ED Triage Notes (Signed)
Pt endorses emesis and diarrhea since last night, although denies abdominal pain. Sts has vomited 10-12 times and multiple episodes of diarrhea. Also would like evaluation of ongoing central back pain x 3 weeks. Given steroid injection by PCP, which helped for two days but pain has returned. Hx back surgery.

## 2019-08-14 ENCOUNTER — Telehealth: Payer: Self-pay | Admitting: Family Medicine

## 2019-08-14 DIAGNOSIS — G8929 Other chronic pain: Secondary | ICD-10-CM

## 2019-08-14 NOTE — Telephone Encounter (Signed)
CB#[phone] Need a referral sent to Dr. Nicholaus Bloom [phone]) Pain Management so can get an appt

## 2019-08-14 NOTE — Telephone Encounter (Signed)
Referral placed.

## 2019-08-14 NOTE — ED Provider Notes (Signed)
[facility] EMERGENCY DEPARTMENT Provider Note   CSN: [id number] Arrival date & time: 08/13/19  [id number]     History Chief Complaint  Patient presents with  . Diarrhea  . Emesis  . Back Pain    Garrett Munoz is a 74 y.o. male.  HPI Patient presents to the emergency department with nausea vomiting diarrhea that started last night.  The patient states he is also having some abdominal discomfort.  Patient states that he has had multiple watery diarrhea episodes.  The patient states he has vomited 10-12 times since it started.  The patient states that he did not take any medications prior to arrival for symptoms.  Patient states he has chronic lower back..  The patient states that this does not feel new or different.  The patient denies chest pain, shortness of breath, headache,blurred vision, neck pain, fever, cough, weakness, numbness, dizziness, anorexia, edema, rash, dysuria, hematemesis, bloody stool, near syncope, or syncope.    Past Medical History:  Diagnosis Date  . Atrial fibrillation ([place])   . Bronchitis    hx  . Cataract    right eye  . DDD (degenerative disc disease), lumbosacral   . Dysrhythmia    irregular heatbeat  . Eye worm    right eye  . Gout   . History of kidney stones   . Hypertension   . Left knee DJD   . Morbid obesity with BMI of 40.0-44.9, adult ([place])   . Pneumonia    hx  . Prediabetes   . Radicular syndrome of left leg   . S/P total knee replacement    right  . Sleep apnea    can't use sleep study 10 yrs ago  . 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     Patient Active Problem List   Diagnosis Date Noted  . Irritable bowel syndrome (IBS) 02/27/2019  . Spinal stenosis of lumbar region 08/18/2017  . Spinal stenosis at L4-L5 level 08/18/2017  . Guaiac positive stools 11/10/2015  . CAP (community acquired pneumonia) 04/10/2015  . Fatigue 04/10/2015  . Hypertension 04/10/2015  . Pneumonia 04/10/2015  . HCAP (healthcare-associated  pneumonia)   . Persistent atrial fibrillation ([place])   . Anticoagulation adequate 02/21/2015  . OSA (obstructive sleep apnea) 02/01/2015  . Atrial fibrillation ([place]) 02/01/2015  . History of gout 02/01/2015  . Morbid obesity ([place]) 02/01/2015  . Pain in left knee 02/24/2012  . Weakness of left leg 02/24/2012  . Gout   . Cataract   . Eye worm   . S/P total knee replacement   . DDD (degenerative disc disease), lumbosacral   . Radicular syndrome of left leg   . Left knee DJD     Past Surgical History:  Procedure Laterality Date  . CARDIOVERSION N/A 03/04/2015   Procedure: CARDIOVERSION;  Surgeon: [name], MD;  Location: [place];  Service: Cardiovascular;  Laterality: N/A;  . CHOLECYSTECTOMY    . COLONOSCOPY N/A 01/01/2016   Procedure: COLONOSCOPY;  Surgeon: [name], MD;  [facility];  Service: Endoscopy;  Laterality: N/A;  1:45  . COLONOSCOPY N/A 01/14/2017   Procedure: COLONOSCOPY;  Surgeon: [name], MD;  [facility];  Service: Endoscopy;  Laterality: N/A;  12:05  . EYE EXAMINATION UNDER ANESTHESIA W/ RETINAL CRYOTHERAPY AND RETINAL LASER  07/2011  . EYE SURGERY  02/2011   cat rt  . KNEE ARTHROSCOPY     bilateral  . LUMBAR LAMINECTOMY/DECOMPRESSION MICRODISCECTOMY N/A 08/18/2017  Procedure: Bilateral microlumbar decompression Lumbar four-Lumbar five;  Surgeon: [name], MD;  Location: [place];  Service: Orthopedics;  Laterality: N/A;  . POLYPECTOMY  01/01/2016   Procedure: POLYPECTOMY;  Surgeon: [name], MD;  [facility];  Service: Endoscopy;;  colon  . RETINAL DETACHMENT SURGERY  03/2011   rt  . TOTAL KNEE ARTHROPLASTY  2003   rt  . TOTAL KNEE ARTHROPLASTY  02/07/2012   Procedure: TOTAL KNEE ARTHROPLASTY;  Surgeon: [name], MD;  Location: [place];  Service: Orthopedics;  Laterality: Left;       Family History  Problem Relation Age of Onset  . Lung cancer Father   . Lung cancer Sister      Social History   Tobacco Use  . Smoking status: Former Smoker    Packs/day: 2.00    Years: 15.00    Pack years: 30.00    Types: Cigarettes    Quit date: 02/02/1992    Years since quitting: 27.5  . Smokeless tobacco: Never Used  . Tobacco comment: occ alcohol  Substance Use Topics  . Alcohol use: Yes    Alcohol/week: 0.0 standard drinks    Comment: occasional  . Drug use: No    Home Medications Prior to Admission medications   Medication Sig Start Date End Date Taking? Authorizing Provider  acetaminophen (TYLENOL) 325 MG tablet Take 2 tablets (650 mg total) by mouth every 6 (six) hours as needed (or Fever >/= 101). 02/08/12   Shepperson, Kirstin, PA-C  allopurinol (ZYLOPRIM) 300 MG tablet TAKE 1 TABLET (300 MG TOTAL) BY MOUTH DAILY. 01/02/19   [name], MD  apixaban (ELIQUIS) 5 MG TABS tablet Take 1 tablet (5 mg total) by mouth 2 (two) times daily. 07/03/19   [name], MD  carvedilol (COREG) 12.5 MG tablet TAKE 1 & 1/2 TABLETS BY MOUTH EVERY MORNING AND 1 TABLET IN THE EVENING 04/03/19   [name], MD  clotrimazole [facility]) 10 MG troche DISSOLVE 1 TROCHE BY MOUTH 5 TIMES DAILY 05/14/19   [name], MD  dicyclomine (BENTYL) 10 MG capsule Take 1 capsule (10 mg total) by mouth 2 (two) times daily before a meal. 02/27/19   Rehman, Mechele Dawley, MD  ELIQUIS 5 MG TABS tablet TAKE 1 TABLET BY MOUTH TWICE A DAY 07/06/19   [name], MD  fluticasone (FLONASE) 50 MCG/ACT nasal spray SHAKE LIQUID AND USE 2 SPRAYS IN EACH NOSTRIL DAILY 04/23/19   [name], MD  hydrochlorothiazide (HYDRODIURIL) 25 MG tablet Take 0.5 tablets (12.5 mg total) by mouth daily. 03/02/19   [name], MD  HYDROcodone-acetaminophen (NORCO) 5-325 MG tablet Take 1 tablet by mouth every 6 (six) hours as needed for moderate pain. 01/05/19   [name], MD  loperamide (IMODIUM) 2 MG capsule Take 2 capsules (4 mg total) by mouth 4 (four) times daily as needed for diarrhea or  loose stools. 08/13/19   Bobbye Petti, Harrell Gave, PA-C  magnesium oxide (MAG-OX) 400 MG tablet Take 400 mg by mouth every evening.     [provider]  promethazine (PHENERGAN) 25 MG tablet Take 1 tablet (25 mg total) by mouth every 6 (six) hours as needed for nausea or vomiting. 08/13/19   Sharesa Kemp, Harrell Gave, PA-C  temazepam (RESTORIL) 30 MG capsule TAKE 1 CAPSULE BY MOUTH AT BEDTIME AS NEEDED FOR SLEEP. 06/29/19   [name], MD    Allergies    Patient has no known allergies.  Review  of Systems   Review of Systems All other systems negative except as documented in the HPI. All pertinent positives and negatives as reviewed in the HPI. Physical Exam Updated Vital Signs BP (!) 132/98 (BP Location: Right Arm)   Pulse 88   Temp 98 F (36.7 C) (Oral)   Resp 13   SpO2 93%   Physical Exam Vitals and nursing note reviewed.  Constitutional:      General: He is not in acute distress.    Appearance: He is well-developed.  HENT:     Head: Normocephalic and atraumatic.  Eyes:     Pupils: Pupils are equal, round, and reactive to light.  Cardiovascular:     Rate and Rhythm: Normal rate and regular rhythm.     Heart sounds: Normal heart sounds. No murmur. No friction rub. No gallop.   Pulmonary:     Effort: Pulmonary effort is normal. No respiratory distress.     Breath sounds: Normal breath sounds. No wheezing.  Abdominal:     General: Bowel sounds are normal. There is no distension.     Palpations: Abdomen is soft.     Tenderness: There is no abdominal tenderness.  Musculoskeletal:     Cervical back: Normal range of motion and neck supple.  Skin:    General: Skin is warm and dry.     Capillary Refill: Capillary refill takes less than 2 seconds.     Findings: No erythema or rash.  Neurological:     Mental Status: He is alert and oriented to person, place, and time.     Motor: No abnormal muscle tone.     Coordination: Coordination normal.  Psychiatric:        Behavior:  Behavior normal.     ED Results / Procedures / Treatments   Labs (all labs ordered are listed, but only abnormal results are displayed) Labs Reviewed  COMPREHENSIVE METABOLIC PANEL - Abnormal; Notable for the following components:      Result Value   Glucose, Bld 125 (*)    All other components within normal limits  CBC - Abnormal; Notable for the following components:   Hemoglobin 17.6 (*)    HCT 53.7 (*)    All other components within normal limits  LIPASE, BLOOD    EKG None  Radiology CT Abdomen Pelvis W Contrast  Result Date: 08/13/2019 CLINICAL DATA:  Diarrhea, emesis. EXAM: CT ABDOMEN AND PELVIS WITH CONTRAST TECHNIQUE: Multidetector CT imaging of the abdomen and pelvis was performed using the standard protocol following bolus administration of intravenous contrast. CONTRAST:  175mL OMNIPAQUE IOHEXOL 300 MG/ML  SOLN COMPARISON:  September 19, 2009. FINDINGS: Lower chest: No acute abnormality. Hepatobiliary: No focal liver abnormality is seen. Status post cholecystectomy. No biliary dilatation. Pancreas: Unremarkable. No pancreatic ductal dilatation or surrounding inflammatory changes. Spleen: Normal in size without focal abnormality. Adrenals/Urinary Tract: Adrenal glands appear normal. Multiple cysts of varying sizes are seen involving the right kidney. The largest is a multi septated cyst measuring 6.9 x 4.6 cm which is mildly enlarged compared to prior exam, but most likely benign given its presence in 2011. No hydronephrosis or renal obstruction is noted. No renal or ureteral calculi are noted. Urinary bladder is unremarkable. Stomach/Bowel: Stomach is within normal limits. Appendix appears normal. No evidence of bowel wall thickening, distention, or inflammatory changes. Vascular/Lymphatic: Aortic atherosclerosis. No enlarged abdominal or pelvic lymph nodes. Reproductive: Mild prostatic enlargement is noted with associated calcifications. This is stable compared to prior exam. Other: No  abdominal wall hernia or abnormality. No abdominopelvic ascites. Musculoskeletal: No acute or significant osseous findings. IMPRESSION: 1. Mild prostatic enlargement. 2. Multiple right renal cysts are noted. The largest is a multiseptated cyst measuring 6.9 x 4.6 cm involving upper pole which is mildly enlarged compared to prior exam, but most likely benign given its presence in 2011. Follow-up ultrasound in 1 year is recommended to ensure stability. 3. No other significant abnormality seen in the abdomen or pelvis. Aortic Atherosclerosis (ICD10-I70.0). Electronically Signed   By: Marijo Conception M.D.   On: 08/13/2019 14:54    Procedures Procedures (including critical care time)  Medications Ordered in ED Medications  sodium chloride flush (NS) 0.9 % injection 3 mL (3 mLs Intravenous Given 08/13/19 1257)  sodium chloride 0.9 % bolus 1,000 mL (0 mLs Intravenous Stopped 08/13/19 1616)  ondansetron (ZOFRAN) injection 4 mg (4 mg Intravenous Given 08/13/19 1256)  iohexol (OMNIPAQUE) 300 MG/ML solution 100 mL (100 mLs Intravenous Contrast Given 08/13/19 1428)  loperamide (IMODIUM) capsule 4 mg (4 mg Oral Given 08/13/19 1558)    ED Course  I have reviewed the triage vital signs and the nursing notes.  Pertinent labs & imaging results that were available during my care of the patient were reviewed by me and considered in my medical decision making (see chart for details).    MDM Rules/Calculators/A&P                      The patient was given IV fluids and Imodium.  The patient is feeling dramatically better.  He is not having any vomiting and is tolerating oral fluids.  The patient is advised to return here for any worsening in his condition.  Patient agrees the plan and all questions were answered.  Patient is advised to follow-up with his primary care doctor for a recheck.  Feels the patient had a gastroenteritis and this was caused his symptoms.  CT scan and laboratory testing were reviewed. Final  Clinical Impression(s) / ED Diagnoses Final diagnoses:  Gastroenteritis    Rx / DC Orders ED Discharge Orders         Ordered    promethazine (PHENERGAN) 25 MG tablet  Every 6 hours PRN,   Status:  Discontinued     08/13/19 1654    loperamide (IMODIUM) 2 MG capsule  4 times daily PRN     08/13/19 1654    promethazine (PHENERGAN) 25 MG tablet  Every 6 hours PRN     08/13/19 1705           Dalia Heading, PA-C 08/14/19 2330    Blanchie Dessert, MD 08/16/19 2150

## 2019-08-22 ENCOUNTER — Telehealth: Payer: Self-pay | Admitting: Family Medicine

## 2019-08-22 NOTE — Chronic Care Management (AMB) (Signed)
  Chronic Care Management   Note  08/22/2019 Name: NIZAR FRIEDRICH MRN: [id number] DOB: [birth year]  TARONE HALLECK is a 74 y.o. year old male who is a primary care patient of Dennard Schaumann, Cammie Mcgee, MD. I reached out to Mervin Kung by phone today in response to a referral sent by Mr. Aldrick Averill Monaco's PCP, [name], MD.   Mr. Kolar was given information about Chronic Care Management services today including:  1. CCM service includes personalized support from designated clinical staff supervised by his physician, including individualized plan of care and coordination with other care providers 2. 24/7 contact phone numbers for assistance for urgent and routine care needs. 3. Service will only be billed when office clinical staff spend 20 minutes or more in a month to coordinate care. 4. Only one practitioner may furnish and bill the service in a calendar month. 5. The patient may stop CCM services at any time (effective at the end of the month) by phone call to the office staff.   Patient agreed to services and verbal consent obtained.   Follow up plan:   [place]

## 2019-08-31 ENCOUNTER — Other Ambulatory Visit: Payer: Self-pay | Admitting: Family Medicine

## 2019-08-31 DIAGNOSIS — M48061 Spinal stenosis, lumbar region without neurogenic claudication: Secondary | ICD-10-CM

## 2019-08-31 DIAGNOSIS — I10 Essential (primary) hypertension: Secondary | ICD-10-CM

## 2019-08-31 DIAGNOSIS — G4700 Insomnia, unspecified: Secondary | ICD-10-CM

## 2019-08-31 DIAGNOSIS — I4891 Unspecified atrial fibrillation: Secondary | ICD-10-CM

## 2019-08-31 DIAGNOSIS — I4819 Other persistent atrial fibrillation: Secondary | ICD-10-CM

## 2019-08-31 DIAGNOSIS — G4733 Obstructive sleep apnea (adult) (pediatric): Secondary | ICD-10-CM

## 2019-09-06 NOTE — Chronic Care Management (AMB) (Addendum)
Chronic Care Management Pharmacy  Name: Garrett Munoz  MRN: [id number] DOB: Jun 28, 1945  Chief Complaint/ HPI  Garrett Munoz,  74 y.o. , male presents for their Initial CCM visit with the clinical pharmacist in office.  PCP : [name], MD  Their chronic conditions include: hypertension, atrial fibrilation, chronic pain.  Office Visits:  06/04/2019 Dennard Schaumann) - suspected COPD, on exam suspected pulmonary edema, using Breztri twice daily for suspected COPD  Consult Visit:  08/13/2019 Maryan Rued, ED) - reported with NVD, given fluids and Imodium and d/c home feeling much better  03/02/2019 Claiborne Billings, Cardiology) - mild bilateral ankle edema - started on HCTZ 12.5 mg  Medications: Outpatient Encounter Medications as of 09/07/2019  Medication Sig   acetaminophen (TYLENOL) 325 MG tablet Take 2 tablets (650 mg total) by mouth every 6 (six) hours as needed (or Fever >/= 101).   allopurinol (ZYLOPRIM) 300 MG tablet TAKE 1 TABLET (300 MG TOTAL) BY MOUTH DAILY.   carvedilol (COREG) 12.5 MG tablet TAKE 1 & 1/2 TABLETS BY MOUTH EVERY MORNING AND 1 TABLET IN THE EVENING   clotrimazole (MYCELEX) 10 MG troche DISSOLVE 1 TROCHE BY MOUTH 5 TIMES DAILY   dicyclomine (BENTYL) 10 MG capsule Take 1 capsule (10 mg total) by mouth 2 (two) times daily before a meal.   ELIQUIS 5 MG TABS tablet TAKE 1 TABLET BY MOUTH TWICE A DAY   fluticasone (FLONASE) 50 MCG/ACT nasal spray SHAKE LIQUID AND USE 2 SPRAYS IN EACH NOSTRIL DAILY   hydrochlorothiazide (HYDRODIURIL) 25 MG tablet Take 0.5 tablets (12.5 mg total) by mouth daily.   temazepam (RESTORIL) 30 MG capsule TAKE 1 CAPSULE BY MOUTH AT BEDTIME AS NEEDED FOR SLEEP.   apixaban (ELIQUIS) 5 MG TABS tablet Take 1 tablet (5 mg total) by mouth 2 (two) times daily. (Patient not taking: Reported on 09/07/2019)   HYDROcodone-acetaminophen (NORCO) 5-325 MG tablet Take 1 tablet by mouth every 6 (six) hours as needed for moderate pain. (Patient not taking:  Reported on 09/07/2019)   loperamide (IMODIUM) 2 MG capsule Take 2 capsules (4 mg total) by mouth 4 (four) times daily as needed for diarrhea or loose stools. (Patient not taking: Reported on 09/07/2019)   magnesium oxide (MAG-OX) 400 MG tablet Take 400 mg by mouth every evening.    promethazine (PHENERGAN) 25 MG tablet Take 1 tablet (25 mg total) by mouth every 6 (six) hours as needed for nausea or vomiting. (Patient not taking: Reported on 09/07/2019)   No facility-administered encounter medications on file as of 09/07/2019.     Current Diagnosis/Assessment:    [employer]: Low Risk    Difficulty of Paying Living Expenses: Not very hard     Goals Addressed             This Visit's Progress    Pharmacy Care Plan:       CARE PLAN ENTRY  Current Barriers:  Chronic Disease Management support, education, and care coordination needs related to Hypertension, Atrial Fibrillation, and chronic pain.   Hypertension Pharmacist Clinical Goal(s): Over the next 180 days, patient will work with PharmD and providers to maintain BP goal <130/80 Current regimen:  HCTZ 25mg  daily, carvedilol 12.5mg   Interventions: Comprehensive medication review Continue current therapy Patient self care activities - Over the next 180 days, patient will: Check BP once daily, document, and provide at future appointments Ensure daily salt intake < 2300 mg/day Contact PharmD or PCP with consistent blood pressure readings > 130/80. Degenerative Disc Disease  Pharmacist Clinical Goal(s) Over the next 180 days, patient will work with PharmD and providers to optimize medication related to pain and minimize symptoms. Current regimen:  Tylenol 325mg  as needed Interventions: Comprehensive medications review Recommend appointment with Dr. Dennard Schaumann to discuss current pain Patient self care activities - Over the next 180 days, patient will: Set up appointment with Dr. Dennard Schaumann to discuss current pain and  treatment options. Contact PharmD or PCP with worsening symptoms.   Initial goal documentation         AFIB   Patient is currently rate controlled.   Patient has failed these meds in past: none noted Patient is currently controlled on the following medications: carvedilol 12.5 mg  No concerns noted with medication.  No signs of dizziness from carvedilol.  Plan  Continue current medications  ,  Hypertension    Office blood pressures are  BP Readings from Last 3 Encounters:  08/13/19 (!) 132/98  06/04/19 (!) 152/86  03/02/19 (!) 154/94    Patient has failed these meds in the past: metoprolol succinate 50mg  daily  Patient checks BP at home daily   Patient is currently controlled on the following medications: HCTZ 25mg  daily,  Patient home BP readings are ranging: reported home BP this morning was 112/78  Patient adherent to medication, wife monitors BP at least once daily no concerns with medications.    Plan  Continue current medications.  Continue to monitor BP.  Notify PharmD or PCP with consistent blood pressure readings > 130/80.      Degenerative disc/pain    Patient has failed these meds in past: none noted  Patient is currently uncontrolled on the following medications: Tylenol 325mg  prn We discussed:   patient's main concern at this visit was pain .  Recommended they follow up with Dr. Dennard Schaumann for persistent pain as he is currently just treating with Tylenol.  Plan  Continue current medications. Follow up with Dr. Dennard Schaumann for appointment on ongoing pain.   Vaccines   Reviewed and discussed patient's vaccination history.    Immunization History  Administered Date(s) Administered   Fluad Quad(high Dose 65+) 12/20/2018   Influenza, High Dose Seasonal PF 01/04/2017   Influenza,inj,Quad PF,6+ Mos 01/04/2018   Influenza-Unspecified 01/18/2016, 01/24/2017   Moderna SARS-COVID-2 Vaccination 05/28/2019   Pneumococcal Conjugate-13 01/04/2017,  02/17/2017   Pneumococcal-Unspecified 02/17/2017   Zoster Recombinat (Shingrix) 12/07/2017, 02/13/2018    Plan  Recommended patient receive Tdap vaccine in office/pharmacy.  Medication Management   Miscellaneous medications: temazepam 30mg ,  OTC's: APAP 325mg , Flonase 87mcg, Patient currently uses [facility] outpatient pharmacy.  Phone #  [phone] Patient reports using pill box method to organize medications and promote adherence. Patient denies missed doses of medication.   Beverly Milch, PharmD Clinical Pharmacist [place] [phone] I have collaborated with the care management provider regarding care management and care coordination activities outlined in this encounter and have reviewed this encounter including documentation in the note and care plan. I am certifying that I agree with the content of this note and encounter as supervising physician.

## 2019-09-07 ENCOUNTER — Other Ambulatory Visit: Payer: Self-pay

## 2019-09-07 ENCOUNTER — Ambulatory Visit: Payer: PPO | Admitting: Pharmacist

## 2019-09-07 DIAGNOSIS — M5137 Other intervertebral disc degeneration, lumbosacral region: Secondary | ICD-10-CM

## 2019-09-07 DIAGNOSIS — I10 Essential (primary) hypertension: Secondary | ICD-10-CM

## 2019-09-07 NOTE — Patient Instructions (Addendum)
Thank you for meeting with me today!  I look forward to working with you to help you meet all of your healthcare goals and answer any questions you may have.  Feel free to contact me anytime!   Visit Information  Goals Addressed            This Visit's Progress   . Pharmacy Care Plan:       CARE PLAN ENTRY  Current Barriers:  . Chronic Disease Management support, education, and care coordination needs related to Hypertension, Atrial Fibrillation, and chronic pain.   Hypertension . Pharmacist Clinical Goal(s): o Over the next 180 days, patient will work with PharmD and providers to maintain BP goal <130/80 . Current regimen:  o HCTZ 25mg  daily, carvedilol 12.5mg   . Interventions: o Comprehensive medication review o Continue current therapy . Patient self care activities - Over the next 180 days, patient will: o Check BP once daily, document, and provide at future appointments o Ensure daily salt intake < 2300 mg/day o Contact PharmD or PCP with consistent blood pressure readings > 130/80. Degenerative Disc Disease . Pharmacist Clinical Goal(s) o Over the next 180 days, patient will work with PharmD and providers to optimize medication related to pain and minimize symptoms. . Current regimen:  o Tylenol 325mg  as needed . Interventions: o Comprehensive medications review o Recommend appointment with Dr. Dennard Schaumann to discuss current pain . Patient self care activities - Over the next 180 days, patient will: o Set up appointment with Dr. Dennard Schaumann to discuss current pain and treatment options. o Contact PharmD or PCP with worsening symptoms.   Initial goal documentation        Mr. Jonassen was given information about Chronic Care Management services today including:  1. CCM service includes personalized support from designated clinical staff supervised by his physician, including individualized plan of care and coordination with other care providers 2. 24/7 contact phone  numbers for assistance for urgent and routine care needs. 3. Standard insurance, coinsurance, copays and deductibles apply for chronic care management only during months in which we provide at least 20 minutes of these services. Most insurances cover these services at 100%, however patients may be responsible for any copay, coinsurance and/or deductible if applicable. This service may help you avoid the need for more expensive face-to-face services. 4. Only one practitioner may furnish and bill the service in a calendar month. 5. The patient may stop CCM services at any time (effective at the end of the month) by phone call to the office staff.  Patient agreed to services and verbal consent obtained.   The patient verbalized understanding of instructions provided today and agreed to receive a mailed copy of patient instruction and/or educational materials. Telephone follow up appointment with pharmacy team member scheduled for:  03/17/2020 @ 12:30 PM  Beverly Milch, PharmD Clinical Pharmacist [place] [phone]

## 2019-09-07 NOTE — Progress Notes (Signed)
I have collaborated with the care management provider regarding care management and care coordination activities outlined in this encounter and have reviewed this encounter including documentation in the note and care plan. I am certifying that I agree with the content of this note and encounter as supervising physician.  

## 2019-10-01 ENCOUNTER — Other Ambulatory Visit: Payer: Self-pay | Admitting: Family Medicine

## 2019-10-01 MED FILL — CARVEDILOL 12.5 MG TABLET: 12.5 | 90 days supply | Qty: 225 | Fill #2

## 2019-10-01 MED FILL — ALLOPURINOL 300 MG TABS: 300 | 90 days supply | Qty: 90 | Fill #3

## 2019-10-01 MED FILL — ELIQUIS 5 MG TABLET: 5 | 90 days supply | Qty: 180 | Fill #1

## 2019-10-02 MED FILL — TEMAZEPAM 30 MG CAPSULE: 30 | 30 days supply | Qty: 30 | Fill #0

## 2019-10-02 NOTE — Telephone Encounter (Signed)
Ok to refill??  Last office visit 06/04/2019.  Last refill 06/29/2019, #2 refills.

## 2019-10-03 DIAGNOSIS — M25512 Pain in left shoulder: Secondary | ICD-10-CM | POA: Diagnosis not present

## 2019-10-03 DIAGNOSIS — M545 Low back pain: Secondary | ICD-10-CM | POA: Diagnosis not present

## 2019-10-03 DIAGNOSIS — M25511 Pain in right shoulder: Secondary | ICD-10-CM | POA: Diagnosis not present

## 2019-10-04 MED FILL — HYDROCODON-APAP 5-325: 5-325 | 7 days supply | Qty: 14 | Fill #0

## 2019-10-19 DIAGNOSIS — I4891 Unspecified atrial fibrillation: Secondary | ICD-10-CM | POA: Diagnosis not present

## 2019-10-19 DIAGNOSIS — D6869 Other thrombophilia: Secondary | ICD-10-CM | POA: Insufficient documentation

## 2019-10-19 DIAGNOSIS — M109 Gout, unspecified: Secondary | ICD-10-CM | POA: Diagnosis not present

## 2019-10-19 DIAGNOSIS — M5136 Other intervertebral disc degeneration, lumbar region: Secondary | ICD-10-CM | POA: Diagnosis not present

## 2019-10-19 DIAGNOSIS — M961 Postlaminectomy syndrome, not elsewhere classified: Secondary | ICD-10-CM | POA: Diagnosis not present

## 2019-10-19 DIAGNOSIS — M5416 Radiculopathy, lumbar region: Secondary | ICD-10-CM | POA: Diagnosis not present

## 2019-10-19 MED FILL — HYDROCODON-APAP 5-325: 5-325 | 7 days supply | Qty: 21 | Fill #0

## 2019-10-30 MED FILL — TEMAZEPAM 30 MG CAPSULE: 30 | 30 days supply | Qty: 30 | Fill #1

## 2019-11-01 DIAGNOSIS — M5136 Other intervertebral disc degeneration, lumbar region: Secondary | ICD-10-CM | POA: Diagnosis not present

## 2019-11-22 DIAGNOSIS — M533 Sacrococcygeal disorders, not elsewhere classified: Secondary | ICD-10-CM | POA: Diagnosis not present

## 2019-11-22 DIAGNOSIS — M5137 Other intervertebral disc degeneration, lumbosacral region: Secondary | ICD-10-CM | POA: Diagnosis not present

## 2019-11-22 DIAGNOSIS — M5136 Other intervertebral disc degeneration, lumbar region: Secondary | ICD-10-CM | POA: Diagnosis not present

## 2019-11-22 DIAGNOSIS — M25512 Pain in left shoulder: Secondary | ICD-10-CM | POA: Diagnosis not present

## 2019-11-22 MED FILL — HYDROCODON-APAP 10-325: 10-325 | 8 days supply | Qty: 30 | Fill #0

## 2019-11-30 MED FILL — TEMAZEPAM 30 MG CAPSULE: 30 | 30 days supply | Qty: 30 | Fill #2

## 2019-12-07 DIAGNOSIS — M25512 Pain in left shoulder: Secondary | ICD-10-CM | POA: Diagnosis not present

## 2019-12-07 DIAGNOSIS — M5136 Other intervertebral disc degeneration, lumbar region: Secondary | ICD-10-CM | POA: Diagnosis not present

## 2019-12-07 DIAGNOSIS — M961 Postlaminectomy syndrome, not elsewhere classified: Secondary | ICD-10-CM | POA: Diagnosis not present

## 2019-12-11 DIAGNOSIS — M5136 Other intervertebral disc degeneration, lumbar region: Secondary | ICD-10-CM | POA: Diagnosis not present

## 2019-12-12 DIAGNOSIS — M2550 Pain in unspecified joint: Secondary | ICD-10-CM | POA: Diagnosis not present

## 2019-12-12 DIAGNOSIS — M1009 Idiopathic gout, multiple sites: Secondary | ICD-10-CM | POA: Diagnosis not present

## 2019-12-12 DIAGNOSIS — M545 Low back pain: Secondary | ICD-10-CM | POA: Diagnosis not present

## 2019-12-12 DIAGNOSIS — Z6841 Body Mass Index (BMI) 40.0 and over, adult: Secondary | ICD-10-CM | POA: Diagnosis not present

## 2019-12-12 DIAGNOSIS — R5383 Other fatigue: Secondary | ICD-10-CM | POA: Diagnosis not present

## 2019-12-12 DIAGNOSIS — M150 Primary generalized (osteo)arthritis: Secondary | ICD-10-CM | POA: Diagnosis not present

## 2019-12-31 ENCOUNTER — Other Ambulatory Visit: Payer: Self-pay | Admitting: Family Medicine

## 2019-12-31 MED FILL — CARVEDILOL 12.5 MG TABLET: 12.5 | 90 days supply | Qty: 225 | Fill #3

## 2019-12-31 MED FILL — TEMAZEPAM 30 MG CAPSULE: 30 | 30 days supply | Qty: 30 | Fill #0

## 2019-12-31 MED FILL — ELIQUIS 5 MG TABLET: 5 | 30 days supply | Qty: 60 | Fill #0

## 2019-12-31 MED FILL — ALLOPURINOL 300 MG TABS: 300 | 90 days supply | Qty: 90 | Fill #0

## 2019-12-31 NOTE — Telephone Encounter (Signed)
Ok to refill??  Last office visit 06/04/2019.  Last refill 10/02/2019, #2 refills.

## 2020-01-09 ENCOUNTER — Other Ambulatory Visit: Payer: Self-pay

## 2020-01-09 ENCOUNTER — Telehealth: Payer: Self-pay

## 2020-01-09 ENCOUNTER — Ambulatory Visit (INDEPENDENT_AMBULATORY_CARE_PROVIDER_SITE_OTHER): Payer: PPO

## 2020-01-09 DIAGNOSIS — Z23 Encounter for immunization: Secondary | ICD-10-CM

## 2020-01-09 NOTE — Telephone Encounter (Signed)
Pt came in for his flu vaccine and wanted to know about his pnuemo vax. I looked and it looks like pt had 2 prevnar 13 vaccines but no pneumovax. I do not know if it was a mistake entered into system? Pt's wife stated that they got them both together and hers is right. Can the pt come in for pneumovax 23 vaccine? Please advise.

## 2020-01-10 DIAGNOSIS — M2550 Pain in unspecified joint: Secondary | ICD-10-CM | POA: Diagnosis not present

## 2020-01-10 DIAGNOSIS — M064 Inflammatory polyarthropathy: Secondary | ICD-10-CM | POA: Diagnosis not present

## 2020-01-10 DIAGNOSIS — M150 Primary generalized (osteo)arthritis: Secondary | ICD-10-CM | POA: Diagnosis not present

## 2020-01-10 DIAGNOSIS — Z6841 Body Mass Index (BMI) 40.0 and over, adult: Secondary | ICD-10-CM | POA: Diagnosis not present

## 2020-01-10 DIAGNOSIS — M1009 Idiopathic gout, multiple sites: Secondary | ICD-10-CM | POA: Diagnosis not present

## 2020-01-10 DIAGNOSIS — M545 Low back pain: Secondary | ICD-10-CM | POA: Diagnosis not present

## 2020-01-10 NOTE — Telephone Encounter (Signed)
Lvm for pt

## 2020-01-10 NOTE — Telephone Encounter (Signed)
Yes he needs pneumovax 23.

## 2020-01-11 NOTE — Telephone Encounter (Signed)
Spoke with wife she is aware Garrett Munoz can come in to get his pnuemovax23

## 2020-01-14 ENCOUNTER — Ambulatory Visit: Payer: PPO

## 2020-01-14 ENCOUNTER — Other Ambulatory Visit: Payer: Self-pay

## 2020-01-15 MED FILL — HYDROCODON-APAP 10-325: 10-325 | 8 days supply | Qty: 30 | Fill #0

## 2020-01-21 ENCOUNTER — Telehealth: Payer: Self-pay | Admitting: Cardiovascular Disease

## 2020-01-21 NOTE — Telephone Encounter (Signed)
Pt wife called in stated that pt  Needs to fu with DR Claiborne Billings because he stated he has some swelling in is feet and ankles .  It comes and goes.  Offered to see a PA, pt only wants to see Dr Claiborne Billings.    Pt c/o swelling: STAT is pt has developed SOB within 24 hours  1) How much weight have you gained and in what time span? No really weight gain , going on more often lately   2) If swelling, where is the swelling located? Feet , legs and ankles, more on the right then the left   3) Are you currently taking a fluid pill? No   4) Are you currently SOB? no  5) Do you have a log of your daily weights (if so, list)? NA   6) Have you gained 3 pounds in a day or 5 pounds in a week? No   7) Have you traveled recently? No  BP has been running good   Best number -[phone]

## 2020-01-21 NOTE — Telephone Encounter (Signed)
Spoke with patient. Patient calling for yearly appointment. Patient scheduled for 2/7 with Dr. Claiborne Billings. Did not want to see an APP sooner. No swelling at this time per patient. Patient advised to call back is swelling reoccurs, he becomes SOB or if he gains 3lbs overnight/ 5lbs in a week. Patient verbalized understanding.

## 2020-01-31 MED FILL — TEMAZEPAM 30 MG CAPSULE: 30 | 30 days supply | Qty: 30 | Fill #1

## 2020-02-01 ENCOUNTER — Other Ambulatory Visit (HOSPITAL_COMMUNITY): Payer: Self-pay | Admitting: Physical Medicine and Rehabilitation

## 2020-02-01 DIAGNOSIS — M961 Postlaminectomy syndrome, not elsewhere classified: Secondary | ICD-10-CM | POA: Diagnosis not present

## 2020-02-01 DIAGNOSIS — Z79891 Long term (current) use of opiate analgesic: Secondary | ICD-10-CM | POA: Diagnosis not present

## 2020-02-01 MED FILL — HYDROCODON-APAP 10-325: 10-325 | 10 days supply | Qty: 30 | Fill #0

## 2020-02-02 DIAGNOSIS — M5137 Other intervertebral disc degeneration, lumbosacral region: Secondary | ICD-10-CM | POA: Diagnosis not present

## 2020-02-04 DIAGNOSIS — M5136 Other intervertebral disc degeneration, lumbar region: Secondary | ICD-10-CM | POA: Diagnosis not present

## 2020-02-04 DIAGNOSIS — E669 Obesity, unspecified: Secondary | ICD-10-CM | POA: Diagnosis not present

## 2020-02-08 ENCOUNTER — Telehealth: Payer: Self-pay | Admitting: Pharmacist

## 2020-02-08 NOTE — Progress Notes (Signed)
Chronic Care Management Pharmacy Assistant   Name: Garrett Munoz  MRN: [id number] DOB: [birth year]  Reason for Encounter: Disease State  Patient Questions:  1.  Have you seen any other providers since your last visit? Yes  2.  Any changes in your medicines or health? No    PCP : Garrett Frizzle, MD   Their chronic conditions include: hypertension, atrial fibrilation, chronic pain.  Consults: 12-12-2019 (Rheumatology): OV with Dr. Trudie Munoz for Morbid (severe) obesity due to excess calories.  12-11-2019 (PT): physical therapy with Garrett Munoz for dx intervertebral disc degeneration, lumbar region.  12-07-2019 (PT): physical therapy with Garrett Munoz for dx intervertebral disc degeneration, lumbar region.   11-22-2019 (PT): physical therapy with Garrett Munoz for dx intervertebral disc degeneration, lumbar region.  11-01-2019 (PT): physical therapy with Garrett Munoz for dx intervertebral disc degeneration, lumbar region.  10-19-2019 (PT): physical therapy with Garrett Munoz for dx intervertebral disc degeneration, lumbar region.  10-03-2019 (Ortho): OV with Dr. Tonita Munoz for diagnosis low back pain, pain in the left and right shoulder.  Allergies:  No Known Allergies  Medications: Outpatient Encounter Medications as of 02/08/2020  Medication Sig  . acetaminophen (TYLENOL) 325 MG tablet Take 2 tablets (650 mg total) by mouth every 6 (six) hours as needed (or Fever >/= 101).  [name] allopurinol (ZYLOPRIM) 300 MG tablet TAKE 1 TABLET (300 MG TOTAL) BY MOUTH DAILY.  [name] apixaban (ELIQUIS) 5 MG TABS tablet Take 1 tablet (5 mg total) by mouth 2 (two) times daily. (Patient not taking: Reported on 09/07/2019)  . carvedilol (COREG) 12.5 MG tablet TAKE 1 & 1/2 TABLETS BY MOUTH EVERY MORNING AND 1 TABLET IN THE EVENING  . clotrimazole (MYCELEX) 10 MG troche DISSOLVE 1 TROCHE BY MOUTH 5 TIMES DAILY  . dicyclomine (BENTYL) 10 MG capsule Take 1 capsule (10 mg total) by mouth 2 (two) times daily before a  meal.  . ELIQUIS 5 MG TABS tablet TAKE 1 TABLET BY MOUTH TWICE A DAY  . fluticasone (FLONASE) 50 MCG/ACT nasal spray SHAKE LIQUID AND USE 2 SPRAYS IN EACH NOSTRIL DAILY  . hydrochlorothiazide (HYDRODIURIL) 25 MG tablet Take 0.5 tablets (12.5 mg total) by mouth daily.  [name] HYDROcodone-acetaminophen (NORCO) 5-325 MG tablet Take 1 tablet by mouth every 6 (six) hours as needed for moderate pain. (Patient not taking: Reported on 09/07/2019)  . loperamide (IMODIUM) 2 MG capsule Take 2 capsules (4 mg total) by mouth 4 (four) times daily as needed for diarrhea or loose stools. (Patient not taking: Reported on 09/07/2019)  . magnesium oxide (MAG-OX) 400 MG tablet Take 400 mg by mouth every evening.   . promethazine (PHENERGAN) 25 MG tablet Take 1 tablet (25 mg total) by mouth every 6 (six) hours as needed for nausea or vomiting. (Patient not taking: Reported on 09/07/2019)  . temazepam (RESTORIL) 30 MG capsule TAKE 1 CAPSULE BY MOUTH AT BEDTIME AS NEEDED FOR SLEEP.   No facility-administered encounter medications on file as of 02/08/2020.    Current Diagnosis: Patient Active Problem List   Diagnosis Date Noted  . Irritable bowel syndrome (IBS) 02/27/2019  . Spinal stenosis of lumbar region 08/18/2017  . Spinal stenosis at L4-L5 level 08/18/2017  . Guaiac positive stools 11/10/2015  . CAP (community acquired pneumonia) 04/10/2015  . Fatigue 04/10/2015  . Hypertension 04/10/2015  . Pneumonia 04/10/2015  . HCAP (healthcare-associated pneumonia)   . Persistent atrial fibrillation ([place])   . Anticoagulation adequate 02/21/2015  . OSA (obstructive sleep apnea) 02/01/2015  .  Atrial fibrillation ([place]) 02/01/2015  . History of gout 02/01/2015  . Morbid obesity ([place]) 02/01/2015  . Pain in left knee 02/24/2012  . Weakness of left leg 02/24/2012  . Gout   . Cataract   . Eye worm   . S/P total knee replacement   . DDD (degenerative disc disease), lumbosacral   . Radicular syndrome of left leg   . Left  knee DJD     Goals Addressed   None    Reviewed chart prior to disease state call. Spoke with patient regarding BP  Recent Office Vitals: BP Readings from Last 3 Encounters:  08/13/19 (!) 132/98  06/04/19 (!) 152/86  03/02/19 (!) 154/94   Pulse Readings from Last 3 Encounters:  08/13/19 88  06/04/19 62  03/02/19 63    Wt Readings from Last 3 Encounters:  06/04/19 (!) 319 lb (144.7 kg)  03/02/19 (!) 322 lb (146.1 kg)  02/27/19 (!) 319 lb 11.2 oz (145 kg)     Kidney Function Lab Results  Component Value Date/Time   CREATININE 0.99 08/13/2019 07:36 AM   CREATININE 0.92 06/04/2019 10:26 AM   CREATININE 0.88 08/24/2018 10:51 AM   GFRNONAA >60 08/13/2019 07:36 AM   GFRNONAA 81 06/04/2019 10:26 AM   GFRAA >60 08/13/2019 07:36 AM   GFRAA 94 06/04/2019 10:26 AM    BMP Latest Ref Rng & Units 08/13/2019 06/04/2019 08/24/2018  Glucose 70 - 99 mg/dL 125(H) 95 99  BUN 8 - 23 mg/dL 10 11 13   Creatinine 0.61 - 1.24 mg/dL 0.99 0.92 0.88  BUN/Creat Ratio 6 - 22 (calc) - NOT APPLICABLE NOT APPLICABLE  Sodium 704 - 145 mmol/L 140 144 143  Potassium 3.5 - 5.1 mmol/L 4.3 5.0 5.0  Chloride 98 - 111 mmol/L 102 104 104  CO2 22 - 32 mmol/L 29 32 31  Calcium 8.9 - 10.3 mg/dL 9.1 9.3 9.1    . Current antihypertensive regimen:  o HCTZ 25 mg daily o Carvedilol 12.5 mg   . How often are you checking your Blood Pressure? daily   . Current home BP readings: 120/78 is the most recent reading from today  . What recent interventions/DTPs have been made by any provider to improve Blood Pressure control since last CPP Visit: none at this time  . Any recent hospitalizations or ED visits since last visit with CPP? No   . What diet changes have been made to improve Blood Pressure Control?  o Patient's wife states she helps maintain what he eats  . What exercise is being done to improve your Blood Pressure Control?  o Patient's states he is somewhat active, but had two knee replacements in the  past that limit him from much physical activity.  Adherence Review: Is the patient currently on ACE/ARB medication? No Does the patient have >5 day gap between last estimated fill dates? No CPP please confirm   Follow-Up:  Pharmacist Review   Fanny Skates, [place] Pharmacist Assistant [phone]

## 2020-02-28 MED FILL — TEMAZEPAM 30 MG CAPSULE: 30 | 30 days supply | Qty: 30 | Fill #2

## 2020-02-28 MED FILL — AMOXICILLIN 500 MG CAPSULE: 500 | 3 days supply | Qty: 12 | Fill #1

## 2020-03-03 DIAGNOSIS — M545 Low back pain, unspecified: Secondary | ICD-10-CM | POA: Diagnosis not present

## 2020-03-03 DIAGNOSIS — M542 Cervicalgia: Secondary | ICD-10-CM | POA: Diagnosis not present

## 2020-03-04 ENCOUNTER — Ambulatory Visit (INDEPENDENT_AMBULATORY_CARE_PROVIDER_SITE_OTHER): Payer: PPO | Admitting: Internal Medicine

## 2020-03-06 ENCOUNTER — Other Ambulatory Visit (HOSPITAL_COMMUNITY): Payer: Self-pay | Admitting: Physical Medicine and Rehabilitation

## 2020-03-06 DIAGNOSIS — Z6841 Body Mass Index (BMI) 40.0 and over, adult: Secondary | ICD-10-CM | POA: Diagnosis not present

## 2020-03-06 DIAGNOSIS — M5137 Other intervertebral disc degeneration, lumbosacral region: Secondary | ICD-10-CM | POA: Diagnosis not present

## 2020-03-06 MED FILL — GABAPENTIN 300 MG CAPSULE: 300 | 30 days supply | Qty: 90 | Fill #0

## 2020-03-06 MED FILL — HYDROCODON-APAP 10-325: 10-325 | 10 days supply | Qty: 30 | Fill #0

## 2020-03-09 ENCOUNTER — Inpatient Hospital Stay (HOSPITAL_COMMUNITY): Payer: PPO

## 2020-03-09 ENCOUNTER — Emergency Department (HOSPITAL_COMMUNITY): Payer: PPO

## 2020-03-09 ENCOUNTER — Inpatient Hospital Stay (HOSPITAL_COMMUNITY)
Admission: EM | Admit: 2020-03-09 | Discharge: 2020-03-12 | DRG: 291 | Disposition: A | Discharge status: Home Health | Payer: PPO | Attending: Internal Medicine | Admitting: Internal Medicine

## 2020-03-09 ENCOUNTER — Encounter (HOSPITAL_COMMUNITY): Payer: Self-pay

## 2020-03-09 ENCOUNTER — Other Ambulatory Visit: Payer: Self-pay

## 2020-03-09 DIAGNOSIS — I4819 Other persistent atrial fibrillation: Secondary | ICD-10-CM | POA: Diagnosis present

## 2020-03-09 DIAGNOSIS — I110 Hypertensive heart disease with heart failure: Principal | ICD-10-CM | POA: Diagnosis present

## 2020-03-09 DIAGNOSIS — I5021 Acute systolic (congestive) heart failure: Secondary | ICD-10-CM | POA: Diagnosis not present

## 2020-03-09 DIAGNOSIS — E662 Morbid (severe) obesity with alveolar hypoventilation: Secondary | ICD-10-CM | POA: Diagnosis present

## 2020-03-09 DIAGNOSIS — J9622 Acute and chronic respiratory failure with hypercapnia: Secondary | ICD-10-CM | POA: Diagnosis not present

## 2020-03-09 DIAGNOSIS — J9621 Acute and chronic respiratory failure with hypoxia: Secondary | ICD-10-CM

## 2020-03-09 DIAGNOSIS — M159 Polyosteoarthritis, unspecified: Secondary | ICD-10-CM | POA: Diagnosis not present

## 2020-03-09 DIAGNOSIS — M1990 Unspecified osteoarthritis, unspecified site: Secondary | ICD-10-CM

## 2020-03-09 DIAGNOSIS — Z20822 Contact with and (suspected) exposure to covid-19: Secondary | ICD-10-CM | POA: Diagnosis not present

## 2020-03-09 DIAGNOSIS — M109 Gout, unspecified: Secondary | ICD-10-CM | POA: Diagnosis present

## 2020-03-09 DIAGNOSIS — I517 Cardiomegaly: Secondary | ICD-10-CM | POA: Diagnosis not present

## 2020-03-09 DIAGNOSIS — J9602 Acute respiratory failure with hypercapnia: Secondary | ICD-10-CM | POA: Diagnosis present

## 2020-03-09 DIAGNOSIS — Z6841 Body Mass Index (BMI) 40.0 and over, adult: Secondary | ICD-10-CM | POA: Diagnosis not present

## 2020-03-09 DIAGNOSIS — I509 Heart failure, unspecified: Secondary | ICD-10-CM

## 2020-03-09 DIAGNOSIS — E872 Acidosis: Secondary | ICD-10-CM | POA: Diagnosis not present

## 2020-03-09 DIAGNOSIS — R52 Pain, unspecified: Secondary | ICD-10-CM

## 2020-03-09 DIAGNOSIS — R5381 Other malaise: Secondary | ICD-10-CM

## 2020-03-09 DIAGNOSIS — Z96651 Presence of right artificial knee joint: Secondary | ICD-10-CM | POA: Diagnosis present

## 2020-03-09 DIAGNOSIS — G8929 Other chronic pain: Secondary | ICD-10-CM | POA: Diagnosis not present

## 2020-03-09 DIAGNOSIS — I5031 Acute diastolic (congestive) heart failure: Secondary | ICD-10-CM | POA: Diagnosis present

## 2020-03-09 DIAGNOSIS — R4182 Altered mental status, unspecified: Secondary | ICD-10-CM | POA: Diagnosis not present

## 2020-03-09 DIAGNOSIS — Z801 Family history of malignant neoplasm of trachea, bronchus and lung: Secondary | ICD-10-CM | POA: Diagnosis not present

## 2020-03-09 DIAGNOSIS — G4700 Insomnia, unspecified: Secondary | ICD-10-CM | POA: Diagnosis not present

## 2020-03-09 DIAGNOSIS — J9601 Acute respiratory failure with hypoxia: Secondary | ICD-10-CM | POA: Diagnosis not present

## 2020-03-09 DIAGNOSIS — Z87891 Personal history of nicotine dependence: Secondary | ICD-10-CM

## 2020-03-09 DIAGNOSIS — J441 Chronic obstructive pulmonary disease with (acute) exacerbation: Secondary | ICD-10-CM | POA: Diagnosis not present

## 2020-03-09 DIAGNOSIS — I4891 Unspecified atrial fibrillation: Secondary | ICD-10-CM | POA: Diagnosis present

## 2020-03-09 DIAGNOSIS — R0602 Shortness of breath: Secondary | ICD-10-CM | POA: Diagnosis not present

## 2020-03-09 DIAGNOSIS — Z87442 Personal history of urinary calculi: Secondary | ICD-10-CM

## 2020-03-09 DIAGNOSIS — R7303 Prediabetes: Secondary | ICD-10-CM | POA: Diagnosis present

## 2020-03-09 DIAGNOSIS — G9341 Metabolic encephalopathy: Secondary | ICD-10-CM

## 2020-03-09 DIAGNOSIS — J811 Chronic pulmonary edema: Secondary | ICD-10-CM | POA: Diagnosis not present

## 2020-03-09 DIAGNOSIS — J810 Acute pulmonary edema: Secondary | ICD-10-CM | POA: Diagnosis not present

## 2020-03-09 DIAGNOSIS — D751 Secondary polycythemia: Secondary | ICD-10-CM | POA: Diagnosis present

## 2020-03-09 DIAGNOSIS — I10 Essential (primary) hypertension: Secondary | ICD-10-CM | POA: Diagnosis not present

## 2020-03-09 LAB — CBC WITH DIFFERENTIAL/PLATELET
Abs Immature Granulocytes: 0.02 10*3/uL (ref 0.00–0.07)
Basophils Absolute: 0 10*3/uL (ref 0.0–0.1)
Basophils Relative: 1 %
Eosinophils Absolute: 0.4 10*3/uL (ref 0.0–0.5)
Eosinophils Relative: 4 %
HCT: 47.8 % (ref 39.0–52.0)
Hemoglobin: 14.8 g/dL (ref 13.0–17.0)
Immature Granulocytes: 0 %
Lymphocytes Relative: 16 %
Lymphs Abs: 1.4 10*3/uL (ref 0.7–4.0)
MCH: 31.5 pg (ref 26.0–34.0)
MCHC: 31 g/dL (ref 30.0–36.0)
MCV: 101.7 fL — ABNORMAL HIGH (ref 80.0–100.0)
Monocytes Absolute: 0.7 10*3/uL (ref 0.1–1.0)
Monocytes Relative: 8 %
Neutro Abs: 6.3 10*3/uL (ref 1.7–7.7)
Neutrophils Relative %: 71 %
Platelets: 188 10*3/uL (ref 150–400)
RBC: 4.7 MIL/uL (ref 4.22–5.81)
RDW: 13.4 % (ref 11.5–15.5)
WBC: 8.8 10*3/uL (ref 4.0–10.5)
nRBC: 0 % (ref 0.0–0.2)

## 2020-03-09 LAB — BRAIN NATRIURETIC PEPTIDE: B Natriuretic Peptide: 261.1 pg/mL — ABNORMAL HIGH (ref 0.0–100.0)

## 2020-03-09 LAB — I-STAT ARTERIAL BLOOD GAS, ED
Acid-Base Excess: 5 mmol/L — ABNORMAL HIGH (ref 0.0–2.0)
Bicarbonate: 37.6 mmol/L — ABNORMAL HIGH (ref 20.0–28.0)
Calcium, Ion: 1.22 mmol/L (ref 1.15–1.40)
HCT: 48 % (ref 39.0–52.0)
Hemoglobin: 16.3 g/dL (ref 13.0–17.0)
O2 Saturation: 91 %
Potassium: 4.4 mmol/L (ref 3.5–5.1)
Sodium: 138 mmol/L (ref 135–145)
TCO2: 40 mmol/L — ABNORMAL HIGH (ref 22–32)
pCO2 arterial: 95.3 mmHg (ref 32.0–48.0)
pH, Arterial: 7.204 — ABNORMAL LOW (ref 7.350–7.450)
pO2, Arterial: 79 mmHg — ABNORMAL LOW (ref 83.0–108.0)

## 2020-03-09 LAB — COMPREHENSIVE METABOLIC PANEL
ALT: 19 U/L (ref 0–44)
AST: 21 U/L (ref 15–41)
Albumin: 3 g/dL — ABNORMAL LOW (ref 3.5–5.0)
Alkaline Phosphatase: 57 U/L (ref 38–126)
Anion gap: 8 (ref 5–15)
BUN: 9 mg/dL (ref 8–23)
CO2: 30 mmol/L (ref 22–32)
Calcium: 8.6 mg/dL — ABNORMAL LOW (ref 8.9–10.3)
Chloride: 98 mmol/L (ref 98–111)
Creatinine, Ser: 0.86 mg/dL (ref 0.61–1.24)
GFR, Estimated: >60 mL/min (ref >60)
Glucose, Bld: 141 mg/dL — ABNORMAL HIGH (ref 70–99)
Potassium: 4.1 mmol/L (ref 3.5–5.1)
Sodium: 136 mmol/L (ref 135–145)
Total Bilirubin: 0.6 mg/dL (ref 0.3–1.2)
Total Protein: 6.4 g/dL — ABNORMAL LOW (ref 6.5–8.1)

## 2020-03-09 LAB — BLOOD GAS, ARTERIAL
Acid-Base Excess: 8.6 mmol/L — ABNORMAL HIGH (ref 0.0–2.0)
Bicarbonate: 36 mmol/L — ABNORMAL HIGH (ref 20.0–28.0)
Drawn by: 33100
FIO2: 40
O2 Saturation: 96.5 %
Patient temperature: 36.8
pCO2 arterial: 88.5 mmHg (ref 32.0–48.0)
pH, Arterial: 7.232 — ABNORMAL LOW (ref 7.350–7.450)
pO2, Arterial: 101 mmHg (ref 83.0–108.0)

## 2020-03-09 LAB — GLUCOSE, CAPILLARY
Glucose-Capillary: 128 mg/dL — ABNORMAL HIGH (ref 70–99)
Glucose-Capillary: 123 mg/dL — ABNORMAL HIGH (ref 70–99)

## 2020-03-09 LAB — URINALYSIS, ROUTINE W REFLEX MICROSCOPIC
Bilirubin Urine: NEGATIVE
Glucose, UA: NEGATIVE mg/dL
Hgb urine dipstick: NEGATIVE
Ketones, ur: NEGATIVE mg/dL
Nitrite: NEGATIVE
Protein, ur: NEGATIVE mg/dL
Specific Gravity, Urine: 1.008 (ref 1.005–1.030)
pH: 5 (ref 5.0–8.0)

## 2020-03-09 LAB — TROPONIN I (HIGH SENSITIVITY)
Troponin I (High Sensitivity): 5 ng/L (ref <18)
Troponin I (High Sensitivity): 6 ng/L

## 2020-03-09 LAB — SEDIMENTATION RATE: Sed Rate: 27 mm/hr — ABNORMAL HIGH (ref 0–16)

## 2020-03-09 LAB — C-REACTIVE PROTEIN: CRP: 8 mg/dL — ABNORMAL HIGH (ref <1.0)

## 2020-03-09 LAB — RESPIRATORY PANEL BY RT PCR (FLU A&B, COVID)
Influenza A by PCR: NEGATIVE
Influenza B by PCR: NEGATIVE
SARS Coronavirus 2 by RT PCR: NEGATIVE

## 2020-03-09 LAB — URIC ACID: Uric Acid, Serum: 4.3 mg/dL (ref 3.7–8.6)

## 2020-03-09 MED ORDER — GABAPENTIN 300 MG PO CAPS
600.0000 mg | ORAL_CAPSULE | Freq: Every day | ORAL | Status: DC
  Administered 2020-03-10 – 2020-03-11 (×2): 600 mg via ORAL
  Filled 2020-03-09 (×3): qty 2

## 2020-03-09 MED ORDER — CARVEDILOL 12.5 MG PO TABS
12.5000 mg | ORAL_TABLET | Freq: Every day | ORAL | Status: DC
  Administered 2020-03-10 – 2020-03-11 (×2): 12.5 mg via ORAL
  Filled 2020-03-09 (×3): qty 1

## 2020-03-09 MED ORDER — PREDNISONE 20 MG PO TABS: 40.0000 mg | ORAL_TABLET | Freq: Every day | ORAL | Status: DC

## 2020-03-09 MED ORDER — BUDESONIDE 0.25 MG/2ML IN SUSP: 0.2500 mg | Freq: Two times a day (BID) | RESPIRATORY_TRACT | Status: DC

## 2020-03-09 MED ORDER — IPRATROPIUM BROMIDE 0.02 % IN SOLN: 0.5000 mg | Freq: Four times a day (QID) | RESPIRATORY_TRACT | Status: DC | PRN

## 2020-03-09 MED ORDER — TEMAZEPAM 15 MG PO CAPS
15.0000 mg | ORAL_CAPSULE | Freq: Every evening | ORAL | Status: DC | PRN
  Administered 2020-03-11 (×2): 15 mg via ORAL
  Filled 2020-03-09 (×2): qty 1

## 2020-03-09 MED ORDER — MAGNESIUM SULFATE 2 GM/50ML IV SOLN
2.0000 g | Freq: Once | INTRAVENOUS | Status: AC
  Administered 2020-03-09: 2 g via INTRAVENOUS
  Filled 2020-03-09: qty 50

## 2020-03-09 MED ORDER — SODIUM CHLORIDE 0.9% FLUSH
3.0000 mL | Freq: Two times a day (BID) | INTRAVENOUS | Status: DC
  Administered 2020-03-09 – 2020-03-12 (×6): 3 mL via INTRAVENOUS

## 2020-03-09 MED ORDER — METHYLPREDNISOLONE SODIUM SUCC 40 MG IJ SOLR
40.0000 mg | Freq: Two times a day (BID) | INTRAMUSCULAR | Status: DC
  Administered 2020-03-10: 40 mg via INTRAVENOUS
  Filled 2020-03-09: qty 1

## 2020-03-09 MED ORDER — HYDROCODONE-ACETAMINOPHEN 10-325 MG PO TABS: 1.0000 | ORAL_TABLET | Freq: Four times a day (QID) | ORAL | Status: DC | PRN

## 2020-03-09 MED ORDER — METHYLPREDNISOLONE SODIUM SUCC 125 MG IJ SOLR
125.0000 mg | Freq: Once | INTRAMUSCULAR | Status: AC
  Administered 2020-03-09: 125 mg via INTRAVENOUS
  Filled 2020-03-09: qty 2

## 2020-03-09 MED ORDER — ACETAMINOPHEN 325 MG PO TABS: 650.0000 mg | ORAL_TABLET | ORAL | Status: DC | PRN

## 2020-03-09 MED ORDER — CARVEDILOL 6.25 MG PO TABS
18.7500 mg | ORAL_TABLET | Freq: Every day | ORAL | Status: DC
  Administered 2020-03-10 – 2020-03-12 (×3): 18.75 mg via ORAL
  Filled 2020-03-09 (×3): qty 1

## 2020-03-09 MED ORDER — CARVEDILOL 12.5 MG PO TABS
12.5000 mg | ORAL_TABLET | ORAL | Status: DC
Start: 2020-03-09 — End: 2020-03-09

## 2020-03-09 MED ORDER — CHLORHEXIDINE GLUCONATE 0.12 % MT SOLN
15.0000 mL | Freq: Two times a day (BID) | OROMUCOSAL | Status: DC
  Administered 2020-03-10 – 2020-03-12 (×5): 15 mL via OROMUCOSAL
  Filled 2020-03-09 (×6): qty 15

## 2020-03-09 MED ORDER — ONDANSETRON HCL 4 MG/2ML IJ SOLN
4.0000 mg | Freq: Once | INTRAMUSCULAR | Status: AC
  Administered 2020-03-09: 4 mg via INTRAVENOUS
  Filled 2020-03-09: qty 2

## 2020-03-09 MED ORDER — SODIUM CHLORIDE 0.9% FLUSH: 3.0000 mL | INTRAVENOUS | Status: DC | PRN

## 2020-03-09 MED ORDER — DOXYCYCLINE HYCLATE 100 MG PO TABS
100.0000 mg | ORAL_TABLET | Freq: Two times a day (BID) | ORAL | Status: DC
  Filled 2020-03-09 (×2): qty 1

## 2020-03-09 MED ORDER — FUROSEMIDE 10 MG/ML IJ SOLN
40.0000 mg | Freq: Two times a day (BID) | INTRAMUSCULAR | Status: DC
  Administered 2020-03-09 – 2020-03-10 (×3): 40 mg via INTRAVENOUS
  Filled 2020-03-09 (×3): qty 4

## 2020-03-09 MED ORDER — FUROSEMIDE 10 MG/ML IJ SOLN
20.0000 mg | Freq: Once | INTRAMUSCULAR | Status: AC
  Administered 2020-03-09: 20 mg via INTRAVENOUS
  Filled 2020-03-09: qty 2

## 2020-03-09 MED ORDER — SODIUM CHLORIDE 0.9 % IV SOLN
250.0000 mL | INTRAVENOUS | Status: DC | PRN
  Administered 2020-03-09: 250 mL via INTRAVENOUS

## 2020-03-09 MED ORDER — METOPROLOL TARTRATE 5 MG/5ML IV SOLN
5.0000 mg | Freq: Four times a day (QID) | INTRAVENOUS | Status: AC
  Administered 2020-03-09 – 2020-03-10 (×2): 5 mg via INTRAVENOUS
  Filled 2020-03-09 (×2): qty 5

## 2020-03-09 MED ORDER — ORAL CARE MOUTH RINSE
15.0000 mL | Freq: Two times a day (BID) | OROMUCOSAL | Status: DC
  Administered 2020-03-10 – 2020-03-11 (×4): 15 mL via OROMUCOSAL

## 2020-03-09 MED ORDER — ONDANSETRON HCL 4 MG/2ML IJ SOLN: 4.0000 mg | Freq: Four times a day (QID) | INTRAMUSCULAR | Status: DC | PRN

## 2020-03-09 MED ORDER — IPRATROPIUM-ALBUTEROL 0.5-2.5 (3) MG/3ML IN SOLN
3.0000 mL | Freq: Four times a day (QID) | RESPIRATORY_TRACT | Status: DC
  Administered 2020-03-09: 3 mL via RESPIRATORY_TRACT
  Filled 2020-03-09: qty 3

## 2020-03-09 MED ORDER — HALOPERIDOL LACTATE 5 MG/ML IJ SOLN
5.0000 mg | Freq: Four times a day (QID) | INTRAMUSCULAR | Status: AC | PRN
  Filled 2020-03-09: qty 1

## 2020-03-09 MED ORDER — ACETAMINOPHEN 325 MG PO TABS: 650.0000 mg | ORAL_TABLET | Freq: Four times a day (QID) | ORAL | Status: DC | PRN

## 2020-03-09 MED ORDER — HYDROMORPHONE HCL 1 MG/ML IJ SOLN
0.5000 mg | Freq: Once | INTRAMUSCULAR | Status: AC
  Administered 2020-03-09: 0.5 mg via INTRAVENOUS
  Filled 2020-03-09: qty 1

## 2020-03-09 MED ORDER — APIXABAN 5 MG PO TABS
5.0000 mg | ORAL_TABLET | Freq: Two times a day (BID) | ORAL | Status: DC
  Administered 2020-03-10 – 2020-03-12 (×5): 5 mg via ORAL
  Filled 2020-03-09 (×5): qty 1

## 2020-03-09 MED ORDER — INSULIN ASPART 100 UNIT/ML ~~LOC~~ SOLN
0.0000 [IU] | Freq: Three times a day (TID) | SUBCUTANEOUS | Status: DC
  Administered 2020-03-10: 1 [IU] via SUBCUTANEOUS

## 2020-03-09 MED ORDER — FLUTICASONE PROPIONATE 50 MCG/ACT NA SUSP
2.0000 | Freq: Every day | NASAL | Status: DC | PRN
  Filled 2020-03-09: qty 16

## 2020-03-09 MED ORDER — IPRATROPIUM BROMIDE 0.02 % IN SOLN: 0.5000 mg | Freq: Four times a day (QID) | RESPIRATORY_TRACT | Status: DC

## 2020-03-09 NOTE — ED Provider Notes (Signed)
[facility] EMERGENCY DEPARTMENT Provider Note   CSN: [id number] Arrival date & time: 03/09/20  1144     History Chief Complaint  Patient presents with  . Generalized swelling    Garrett Munoz is a 74 y.o. male.  Who presents emergency department with generalized swelling and severe pain.  Patient reports 2 weeks of worsening generalized swelling, increased shortness of breath and wheezing.  He has never had anything like this before.  He has chronic pain due to arthritis and is on hydrocodone 5-3 25.  He said he is talked to his pain management doctor several times because his pain has been so severe.  His wife reports that he has been complaining of severe bilateral hip and shoulder pain.  His pain has become so intolerable that he is unable to stand up on his own.  He feels weak in those joints.  He has never had severe pain like this in the past.  He has a history of chronic A. fib and is anticoagulated on Eliquis.  He has a history of hypertension, obesity, chronic bronchitis, obstructive sleep apnea and left leg radiculopathy.  He denies any recent medication changes, fevers, chills.  He denies cough.  He has been vaccinated against coronavirus and had a booster shot.  HPI     Past Medical History:  Diagnosis Date  . Atrial fibrillation ([place])   . Bronchitis    hx  . Cataract    right eye  . DDD (degenerative disc disease), lumbosacral   . Dysrhythmia    irregular heatbeat  . Eye worm    right eye  . Gout   . History of kidney stones   . Hypertension   . Left knee DJD   . Morbid obesity with BMI of 40.0-44.9, adult ([place])   . Pneumonia    hx  . Prediabetes   . Radicular syndrome of left leg   . S/P total knee replacement    right  . Sleep apnea    can't use sleep study 10 yrs ago  . 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     Patient Active Problem List   Diagnosis Date Noted  . Irritable bowel syndrome (IBS) 02/27/2019  . Spinal stenosis of lumbar  region 08/18/2017  . Spinal stenosis at L4-L5 level 08/18/2017  . Guaiac positive stools 11/10/2015  . CAP (community acquired pneumonia) 04/10/2015  . Fatigue 04/10/2015  . Hypertension 04/10/2015  . Pneumonia 04/10/2015  . HCAP (healthcare-associated pneumonia)   . Persistent atrial fibrillation ([place])   . Anticoagulation adequate 02/21/2015  . OSA (obstructive sleep apnea) 02/01/2015  . Atrial fibrillation ([place]) 02/01/2015  . History of gout 02/01/2015  . Morbid obesity ([place]) 02/01/2015  . Pain in left knee 02/24/2012  . Weakness of left leg 02/24/2012  . Gout   . Cataract   . Eye worm   . S/P total knee replacement   . DDD (degenerative disc disease), lumbosacral   . Radicular syndrome of left leg   . Left knee DJD     Past Surgical History:  Procedure Laterality Date  . CARDIOVERSION N/A 03/04/2015   Procedure: CARDIOVERSION;  Surgeon: [name], MD;  Location: [facility];  Service: Cardiovascular;  Laterality: N/A;  . CHOLECYSTECTOMY    . COLONOSCOPY N/A 01/01/2016   Procedure: COLONOSCOPY;  Surgeon: [name], MD;  [facility];  Service: Endoscopy;  Laterality: N/A;  1:45  . COLONOSCOPY N/A 01/14/2017   Procedure: COLONOSCOPY;  Surgeon: [name], MD;  [facility];  Service: Endoscopy;  Laterality: N/A;  12:05  . EYE EXAMINATION UNDER ANESTHESIA W/ RETINAL CRYOTHERAPY AND RETINAL LASER  07/2011  . EYE SURGERY  02/2011   cat rt  . KNEE ARTHROSCOPY     bilateral  . LUMBAR LAMINECTOMY/DECOMPRESSION MICRODISCECTOMY N/A 08/18/2017   Procedure: Bilateral microlumbar decompression Lumbar four-Lumbar five;  Surgeon: [name], MD;  Location: [facility];  Service: Orthopedics;  Laterality: N/A;  . POLYPECTOMY  01/01/2016   Procedure: POLYPECTOMY;  Surgeon: [name], MD;  [facility];  Service: Endoscopy;;  colon  . RETINAL DETACHMENT SURGERY  03/2011   rt  . TOTAL KNEE ARTHROPLASTY  2003   rt  . TOTAL KNEE  ARTHROPLASTY  02/07/2012   Procedure: TOTAL KNEE ARTHROPLASTY;  Surgeon: [name], MD;  Location: [facility];  Service: Orthopedics;  Laterality: Left;       Family History  Problem Relation Age of Onset  . Lung cancer Father   . Lung cancer Sister     Social History   Tobacco Use  . Smoking status: Former Smoker    Packs/day: 2.00    Years: 15.00    Pack years: 30.00    Types: Cigarettes    Quit date: 02/02/1992    Years since quitting: 28.1  . Smokeless tobacco: Never Used  . Tobacco comment: occ alcohol  Vaping Use  . Vaping Use: Never used  Substance Use Topics  . Alcohol use: Yes    Alcohol/week: 0.0 standard drinks    Comment: occasional  . Drug use: No    Home Medications Prior to Admission medications   Medication Sig Start Date End Date Taking? Authorizing Provider  acetaminophen (TYLENOL) 325 MG tablet Take 2 tablets (650 mg total) by mouth every 6 (six) hours as needed (or Fever >/= 101). 02/08/12   Shepperson, Kirstin, PA-C  allopurinol (ZYLOPRIM) 300 MG tablet TAKE 1 TABLET (300 MG TOTAL) BY MOUTH DAILY. 12/31/19   [name], MD  apixaban (ELIQUIS) 5 MG TABS tablet Take 1 tablet (5 mg total) by mouth 2 (two) times daily. Patient not taking: Reported on 09/07/2019 07/03/19   [name], MD  carvedilol (COREG) 12.5 MG tablet TAKE 1 & 1/2 TABLETS BY MOUTH EVERY MORNING AND 1 TABLET IN THE EVENING 04/03/19   [name], MD  clotrimazole [facility]) 10 MG troche DISSOLVE 1 TROCHE BY MOUTH 5 TIMES DAILY 05/14/19   [name], MD  dicyclomine (BENTYL) 10 MG capsule Take 1 capsule (10 mg total) by mouth 2 (two) times daily before a meal. 02/27/19   Rehman, Mechele Dawley, MD  ELIQUIS 5 MG TABS tablet TAKE 1 TABLET BY MOUTH TWICE A DAY 07/06/19   [name], MD  fluticasone (FLONASE) 50 MCG/ACT nasal spray SHAKE LIQUID AND USE 2 SPRAYS IN EACH NOSTRIL DAILY 04/23/19   [name], MD  hydrochlorothiazide (HYDRODIURIL) 25 MG tablet Take 0.5  tablets (12.5 mg total) by mouth daily. 03/02/19   [name], MD  HYDROcodone-acetaminophen (NORCO) 5-325 MG tablet Take 1 tablet by mouth every 6 (six) hours as needed for moderate pain. Patient not taking: Reported on 09/07/2019 01/05/19   [name], MD  loperamide (IMODIUM) 2 MG capsule Take 2 capsules (4 mg total) by mouth 4 (four) times daily as needed for diarrhea or loose stools. Patient not taking: Reported on 09/07/2019 08/13/19   Dalia Heading, PA-C  magnesium  oxide (MAG-OX) 400 MG tablet Take 400 mg by mouth every evening.     [provider]  promethazine (PHENERGAN) 25 MG tablet Take 1 tablet (25 mg total) by mouth every 6 (six) hours as needed for nausea or vomiting. Patient not taking: Reported on 09/07/2019 08/13/19   Dalia Heading, PA-C  temazepam (RESTORIL) 30 MG capsule TAKE 1 CAPSULE BY MOUTH AT BEDTIME AS NEEDED FOR SLEEP. 12/31/19   [name], MD    Allergies    Patient has no known allergies.  Review of Systems   Review of Systems Ten systems reviewed and are negative for acute change, except as noted in the HPI.   Physical Exam Updated Vital Signs BP 132/85 (BP Location: Right Arm)   Pulse 89   Temp 97.9 F (36.6 C) (Oral)   Resp (!) 22   Ht 5' 10.5" (1.791 m)   Wt (!) 145.2 kg   SpO2 92%   BMI 45.27 kg/m   Physical Exam Vitals and nursing note reviewed.  Constitutional:      General: He is not in acute distress.    Appearance: He is well-developed. He is not diaphoretic.  HENT:     Head: Normocephalic and atraumatic.  Eyes:     General: No scleral icterus.    Conjunctiva/sclera: Conjunctivae normal.  Cardiovascular:     Rate and Rhythm: Normal rate and regular rhythm.     Heart sounds: Normal heart sounds.  Pulmonary:     Effort: Pulmonary effort is normal. Tachypnea present. No respiratory distress.     Breath sounds: Transmitted upper airway sounds present. Examination of the right-lower field reveals rales.  Examination of the left-lower field reveals rales. Rales present.  Abdominal:     Palpations: Abdomen is soft.     Tenderness: There is no abdominal tenderness.  Musculoskeletal:     Cervical back: Normal range of motion and neck supple.     Right lower leg: 3+ Edema present.     Left lower leg: 3+ Edema present.  Skin:    General: Skin is warm and dry.  Neurological:     Mental Status: He is alert.  Psychiatric:        Behavior: Behavior normal.     ED Results / Procedures / Treatments   Labs (all labs ordered are listed, but only abnormal results are displayed) Labs Reviewed  COMPREHENSIVE METABOLIC PANEL  CBC WITH DIFFERENTIAL/PLATELET  BRAIN NATRIURETIC PEPTIDE  TROPONIN I (HIGH SENSITIVITY)    EKG None  Radiology No results found.  Procedures .Critical Care Performed by: Margarita Mail, PA-C Authorized by: Margarita Mail, PA-C   Critical care provider statement:    Critical care time (minutes):  50   Critical care time was exclusive of:  Separately billable procedures and treating other patients   Critical care was necessary to treat or prevent imminent or life-threatening deterioration of the following conditions:  Respiratory failure   Critical care was time spent personally by me on the following activities:  Discussions with consultants, evaluation of patient's response to treatment, examination of patient, ordering and performing treatments and interventions, ordering and review of laboratory studies, ordering and review of radiographic studies, pulse oximetry, re-evaluation of patient's condition, obtaining history from patient or surrogate and review of old charts   (including critical care time)  Medications Ordered in ED Medications - No data to display  ED Course  I have reviewed the triage vital signs and the nursing notes.  Pertinent labs &  imaging results that were available during my care of the patient were reviewed by me and considered in my  medical decision making (see chart for details).    MDM Rules/Calculators/A&P                          CC:sob VS: BP 114/78   Pulse 85   Temp 97.9 F (36.6 C) (Oral)   Resp 18   Ht 5' 10.5" (1.791 m)   Wt (!) 145.2 kg   SpO2 92%   BMI 45.27 kg/m   DV:VOHYWVP is gathered by patient and emr, wife. Previous records obtained and reviewed. DDX:The patient's complaint of sob involves an extensive number of diagnostic and treatment options, and is a complaint that carries with it a high risk of complications, morbidity, and potential mortality. Given the large differential diagnosis, medical decision making is of high complexity. The emergent differential diagnosis for shortness of breath includes, but is not limited to, Pulmonary edema, bronchoconstriction, Pneumonia, Pulmonary embolism, Pneumotherax/ Hemothorax, Dysrythmia, ACS.   Labs: I ordered reviewed and interpreted labs which include Cbc shows no significant abnormality cmp without significant abnormality, mildly elevated glucose Crp/sed rate mildly elevated but not suggestive of autoimmune process Troponin negative bnp 261 with no baseline comparison likely falsely low due to obesity COVID panel ending Imaging: I ordered and reviewed images which included 1 view chest x-ray. I independently visualized and interpreted all imaging. Significant findings include cardiomegaly pulmonary edema.  EKG: Rate controlled A. fib at a rate of 89 Consults: Dr. Roosevelt Locks with [facility] MDM: Patient with pulmonary edema, peripheral edema and cardiomegaly likely new onset CHF.  He is hypoxic requiring 4 to 6 L and only maintaining oxygen saturations in the high 80s to low 90s.  Patient be admitted for acute hypoxic respiratory failure with volume overload.  He does not appear to be in acute kidney injury.  Liver panel is within normal limits. Patient disposition:The patient appears reasonably screened and/or stabilized for discharge and  I doubt any other medical condition or other [facility] requiring further screening, evaluation, or treatment in the ED at this time prior to discharge. I have discussed lab and/or imaging findings with the patient and answered all questions/concerns to the best of my ability.I have discussed return precautions and OP follow up.  Garrett Munoz was evaluated in Emergency Department on 03/09/2020 for the symptoms described in the history of present illness. He was evaluated in the context of the global COVID-19 pandemic, which necessitated consideration that the patient might be at risk for infection with the SARS-CoV-2 virus that causes COVID-19. Institutional protocols and algorithms that pertain to the evaluation of patients at risk for COVID-19 are in a state of rapid change based on information released by regulatory bodies including the CDC and federal and state organizations. These policies and algorithms were followed during the patient's care in the ED.   Final Clinical Impression(s) / ED Diagnoses Final diagnoses:  None    Rx / DC Orders ED Discharge Orders    None       Margarita Mail, PA-C 03/09/20 [place], MD 03/10/20 [phone]

## 2020-03-09 NOTE — ED Notes (Signed)
Attempted report x1. 

## 2020-03-09 NOTE — ED Notes (Signed)
Pt took mask off x 2. Educated pt on importance of keeping mask on. Pt verbalized understanding.

## 2020-03-09 NOTE — ED Triage Notes (Signed)
Pt arrived to ED via POV w/ spouse. Pt reports that over the last 2 weeks he has been swelling and it has been progressively getting worse. Pt reports generalized swelling and pain. Pt's wife reports pt has had intermittent wheezing during this 2 week time. Pt's wife also reports intermittent cyanotic toes and feet.

## 2020-03-09 NOTE — ED Notes (Signed)
Pt transported to CT at this time.

## 2020-03-09 NOTE — Progress Notes (Signed)
RT transported patient on bipap from ED to room 8I32 with no complications. RT on floor received patient and report. Vitals are stable. RT will continue to monitor.

## 2020-03-09 NOTE — Progress Notes (Signed)
Pt's blood gas done, showed CO2 level is now 88.5 was 95.3 earlier, pt is on cont bipap, MD notified, will continue to monitor, Thanks Arvella Nigh RN.

## 2020-03-09 NOTE — H&P (Addendum)
History and Physical    Garrett Munoz HYQ:[id number] DOB: [birth year] DOA: 03/09/2020  PCP: [name], MD (Confirm with patient/family/NH records and if not entered, this has to be entered at [facility] point of entry) Patient coming from: Home  I have personally briefly reviewed patient's old medical records in [place]  Chief Complaint: Shortness of breath, wheezing, leg swelling, back pain  HPI: Garrett Munoz is a 74 y.o. male with medical history significant of chronic A. fib on Eliquis, multiple joints OA, chronic bronchitis, morbid obesity, hypertension, gout, presented with multiple complaints.  Patient has chronic A. fib has been following with cardiologist Dr. Claiborne Billings, most recent visit was January this year.  No history of CHF documented.  And according to patient's wife, patient's A. fib has been fairly controlled.  Recently patient has had worsening of back pain for which she has been following with pain management doctor.  No new medication recent months.  Patient started to have increasing swelling on both legs 3 to 4 weeks ago, along with increasing shortness of breath initially was exertional and became worse this week.  Wife also noticed patient has had increasing cough and wheezing since last week has been using more albuterol nebulizer, patient also has history of OSA not been compliant with CPAP.  Denies any chest pain, no fever chills, no chest pain no urinary symptoms no diarrhea.  Wife also noticed that the patient has been having " fingers nails and toes discoloration recently" ED Course: Chest x-ray showed cardiomegaly and pulmonary edema, patient was found to be hypoxic.  Patient became hypoxic in the ED, very agitated remove oxygen and O2 sat ration dropped to upper 50s and then became lethargic.  Turn up O2 through 5 L and finally oxygen saturation stabilized at 91 to 92%.  Review of Systems: As per HPI otherwise 14 point review of systems negative.    Past  Medical History:  Diagnosis Date  . Atrial fibrillation ([place])   . Bronchitis    hx  . Cataract    right eye  . DDD (degenerative disc disease), lumbosacral   . Dysrhythmia    irregular heatbeat  . Eye worm    right eye  . Gout   . History of kidney stones   . Hypertension   . Left knee DJD   . Morbid obesity with BMI of 40.0-44.9, adult ([place])   . Pneumonia    hx  . Prediabetes   . Radicular syndrome of left leg   . S/P total knee replacement    right  . Sleep apnea    can't use sleep study 10 yrs ago  . 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     Past Surgical History:  Procedure Laterality Date  . CARDIOVERSION N/A 03/04/2015   Procedure: CARDIOVERSION;  Surgeon: [name], MD;  Location: [place];  Service: Cardiovascular;  Laterality: N/A;  . CHOLECYSTECTOMY    . COLONOSCOPY N/A 01/01/2016   Procedure: COLONOSCOPY;  Surgeon: [name], MD;  [facility];  Service: Endoscopy;  Laterality: N/A;  1:45  . COLONOSCOPY N/A 01/14/2017   Procedure: COLONOSCOPY;  Surgeon: [name], MD;  [facility];  Service: Endoscopy;  Laterality: N/A;  12:05  . EYE EXAMINATION UNDER ANESTHESIA W/ RETINAL CRYOTHERAPY AND RETINAL LASER  07/2011  . EYE SURGERY  02/2011   cat rt  . KNEE ARTHROSCOPY     bilateral  . LUMBAR LAMINECTOMY/DECOMPRESSION MICRODISCECTOMY N/A 08/18/2017  Procedure: Bilateral microlumbar decompression Lumbar four-Lumbar five;  Surgeon: [name], MD;  Location: [place];  Service: Orthopedics;  Laterality: N/A;  . POLYPECTOMY  01/01/2016   Procedure: POLYPECTOMY;  Surgeon: [name], MD;  [facility];  Service: Endoscopy;;  colon  . RETINAL DETACHMENT SURGERY  03/2011   rt  . TOTAL KNEE ARTHROPLASTY  2003   rt  . TOTAL KNEE ARTHROPLASTY  02/07/2012   Procedure: TOTAL KNEE ARTHROPLASTY;  Surgeon: [name], MD;  Location: [place];  Service: Orthopedics;  Laterality: Left;     reports that he quit smoking about  28 years ago. His smoking use included cigarettes. He has a 30.00 pack-year smoking history. He has never used smokeless tobacco. He reports current alcohol use. He reports that he does not use drugs.  No Known Allergies  Family History  Problem Relation Age of Onset  . Lung cancer Father   . Lung cancer Sister      Prior to Admission medications   Medication Sig Start Date End Date Taking? Authorizing Provider  acetaminophen (TYLENOL) 325 MG tablet Take 2 tablets (650 mg total) by mouth every 6 (six) hours as needed (or Fever >/= 101). 02/08/12  Yes Shepperson, Kirstin, PA-C  allopurinol (ZYLOPRIM) 300 MG tablet TAKE 1 TABLET (300 MG TOTAL) BY MOUTH DAILY. 12/31/19  Yes [name], MD  apixaban (ELIQUIS) 5 MG TABS tablet Take 1 tablet (5 mg total) by mouth 2 (two) times daily. 07/03/19  Yes [name], MD  carvedilol (COREG) 12.5 MG tablet TAKE 1 & 1/2 TABLETS BY MOUTH EVERY MORNING AND 1 TABLET IN THE EVENING Patient taking differently: Take 12.5-18.75 mg by mouth See admin instructions. TAKE 18.75mg  BY MOUTH EVERY MORNING AND 12.5 IN THE EVENING 04/03/19  Yes [name], MD  fluticasone (FLONASE) 50 MCG/ACT nasal spray SHAKE LIQUID AND USE 2 SPRAYS IN EACH NOSTRIL DAILY Patient taking differently: Place 2 sprays into both nostrils daily as needed for allergies or rhinitis.  04/23/19  Yes [name], MD  gabapentin (NEURONTIN) 300 MG capsule Take 600 mg by mouth at bedtime. 03/06/20  Yes [provider]  HYDROcodone-acetaminophen (NORCO) 10-325 MG tablet Take 1 tablet by mouth 3 (three) times daily as needed for pain. 03/06/20  Yes [provider]  temazepam (RESTORIL) 30 MG capsule TAKE 1 CAPSULE BY MOUTH AT BEDTIME AS NEEDED FOR SLEEP. Patient taking differently: Take 30 mg by mouth at bedtime.  12/31/19  Yes [name], MD  ELIQUIS 5 MG TABS tablet TAKE 1 TABLET BY MOUTH TWICE A DAY Patient not taking: Reported on 03/09/2020 07/06/19   [name], MD  hydrochlorothiazide (HYDRODIURIL) 25 MG tablet Take 0.5 tablets (12.5 mg total) by mouth daily. Patient not taking: Reported on 03/09/2020 03/02/19   [name], MD    Physical Exam: Vitals:   03/09/20 1506 03/09/20 1530 03/09/20 1542 03/09/20 1545  BP:  127/71  118/79  Pulse: 85 96  (!) 101  Resp: 18 14  18   Temp:      TempSrc:      SpO2: 92% (!) 66% 93% 90%  Weight:      Height:        Constitutional: NAD, calm, comfortable Vitals:   03/09/20 1506 03/09/20 1530 03/09/20 1542 03/09/20 1545  BP:  127/71  118/79  Pulse: 85 96  (!) 101  Resp: 18 14  18   Temp:      TempSrc:  SpO2: 92% (!) 66% 93% 90%  Weight:      Height:       Eyes: PERRL, lids and conjunctivae normal ENMT: Mucous membranes are moist. Posterior pharynx clear of any exudate or lesions.Normal dentition.  Neck: normal, supple, no masses, no thyromegaly, hard to evaluate JVD Respiratory: Diminished breathing sound bilaterally bilaterally, fine crackles on bilateral bases, scattered wheezing.  Increasing respiratory effort. No accessory muscle use.  Cardiovascular: Regular rate and rhythm, no murmurs / rubs / gallops. 2+ extremity edema. 2+ pedal pulses. No carotid bruits.  Abdomen: no tenderness, no masses palpated. No hepatosplenomegaly. Bowel sounds positive.  Musculoskeletal: no clubbing / cyanosis. No joint deformity upper and lower extremities. Good ROM, no contractures. Normal muscle tone.  Skin: no rashes, lesions, ulcers. No induration, positive for cyanosis and clubbing Neurologic: No facial droop, lethargic, following simple command, moving all limbs Psychiatric: Lethargic  Labs on Admission: I have personally reviewed following labs and imaging studies  CBC: Recent Labs  Lab 03/09/20 1228  WBC 8.8  NEUTROABS 6.3  HGB 14.8  HCT 47.8  MCV 101.7*  PLT 485   Basic Metabolic Panel: Recent Labs  Lab 03/09/20 1228  NA 136  K 4.1  CL 98  CO2 30  GLUCOSE 141*  BUN 9    CREATININE 0.86  CALCIUM 8.6*   GFR: Estimated Creatinine Clearance: 106 mL/min (by C-G formula based on SCr of 0.86 mg/dL). Liver Function Tests: Recent Labs  Lab 03/09/20 1228  AST 21  ALT 19  ALKPHOS 57  BILITOT 0.6  PROT 6.4*  ALBUMIN 3.0*   No results for input(s): LIPASE, AMYLASE in the last 168 hours. No results for input(s): AMMONIA in the last 168 hours. Coagulation Profile: No results for input(s): INR, PROTIME in the last 168 hours. Cardiac Enzymes: No results for input(s): CKTOTAL, CKMB, CKMBINDEX, TROPONINI in the last 168 hours. BNP (last 3 results) No results for input(s): PROBNP in the last 8760 hours. HbA1C: No results for input(s): HGBA1C in the last 72 hours. CBG: No results for input(s): GLUCAP in the last 168 hours. Lipid Profile: No results for input(s): CHOL, HDL, LDLCALC, TRIG, CHOLHDL, LDLDIRECT in the last 72 hours. Thyroid Function Tests: No results for input(s): TSH, T4TOTAL, FREET4, T3FREE, THYROIDAB in the last 72 hours. Anemia Panel: No results for input(s): VITAMINB12, FOLATE, FERRITIN, TIBC, IRON, RETICCTPCT in the last 72 hours. Urine analysis:    Component Value Date/Time   COLORURINE YELLOW 02/02/2012 1120   APPEARANCEUR CLEAR 02/02/2012 1120   LABSPEC 1.006 02/02/2012 1120   PHURINE 6.5 02/02/2012 1120   GLUCOSEU NEGATIVE 02/02/2012 1120   HGBUR NEGATIVE 02/02/2012 Liberal 02/02/2012 1120   KETONESUR NEGATIVE 02/02/2012 1120   PROTEINUR NEGATIVE 02/02/2012 1120   UROBILINOGEN 0.2 02/02/2012 1120   NITRITE NEGATIVE 02/02/2012 1120   LEUKOCYTESUR TRACE (A) 02/02/2012 1120    Radiological Exams on Admission: DG Chest Port 1 View  Result Date: 03/09/2020 CLINICAL DATA:  Shortness of breath EXAM: PORTABLE CHEST 1 VIEW COMPARISON:  06/04/2019 FINDINGS: Increased interstitial prominence with ill-defined patchy density. Central pulmonary vascular congestion. No pleural effusion or pneumothorax. Cardiomegaly.  IMPRESSION: Findings suggestive of pulmonary edema.  Cardiomegaly. Electronically Signed   By: Macy Mis M.D.   On: 03/09/2020 13:07    EKG: Independently reviewed.  Chronic A. fib  Assessment/Plan Active Problems:   Acute CHF (congestive heart failure) (HCC)   Pulmonary edema  (please populate well all problems here in Problem List. (For example,  if patient is on BP meds at home and you resume or decide to hold them, it is a problem that needs to be her. Same for CAD, COPD, HLD and so on)  Acute CHF decompensation, likely systolic -Check echo TSH lipid panel A1c and UA -Received 20+20 IV Lasix in ED, will start 40 mg Lasix twice daily from tonight. -Given the significant clubbing and cyanosis, suspect this is cor pulmonale, given patient has undertreated COPD, and prolonged OSA not treated. 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  Avoid albuterol given borderline tachycardia, switch to Atrovent plus Pulmicort and short course of p.o. steroid. And doxycycline  Acute metabolic encephalopathy -Can be multifactorial -Address hypoxia and check ABG for evidence of CO2 retention -Patient on Eliquis, will check CT head without contrast -Avoid benzo, start patient on as needed haloperidol  A. Fib, chronic -Continue Coreg, restart Eliquis after CT head  History of prediabetes -A1c, sliding scale for steroid use  Gout -Check uric acid level, avoid HCTZ  Acute COPD exacerbation -As above -Home O2 evaluation  Morbid obesity -Diet exercise, probably not a candidate for bariatric  Diffuse OA of multiple joints -Use narcotics with caution  Insomnia -Cut down benzo     DVT prophylaxis: Restart Eliquis after CT head Code Status: Full code Family Communication: Wife at  bedside Disposition Plan: Patient is sick with complicated condition of CHF, COPD, expect more than two midnight hospital stay.  Long-term prognosis guarded. Consults called: None Admission status: PCU   Lequita Halt MD [facility] Pager [phone]  03/09/2020, 4:02 PM

## 2020-03-09 NOTE — Progress Notes (Signed)
ABG reviewed. Severe CO2 retention with respiratory acidosis uncompensated.   It appeared that pt had a lung function test Feb this year, result implying both restrictive and obstructive lung disease?   Ordered BIPAP and discussed pt's condition with critical care team, who is to see the patient and help [place] with complicated COPD and CHF pt.

## 2020-03-09 NOTE — Consult Note (Signed)
NAME:  Garrett Munoz, MRN:  [id number], DOB:  1945/12/15, LOS: 0 ADMISSION DATE:  03/09/2020, CONSULTATION DATE: 03/09/2020 REFERRING MD: Dr. Roosevelt Locks, CHIEF COMPLAINT: Acute on chronic respiratory failure  Brief History     History of present illness   74 year old obese former smoker, chronically ill man with a history of marginally treated OSA, chronic atrial fibrillation on anticoagulation, hypertension, restrictive/obstructive disease based on spirometry done 06/04/2019, presumed COPD.  Presented to the emergency department 11/21 with acute worsening of bilateral lower extremity edema for 3 to 4 weeks, anasarca, slowly progressive dyspnea for 1 week.  He has had some increased cough.  Apparently has albuterol nebs available although not illness intake home medicine list.  He has been using these more frequently over the last week.  In the ED chest x-ray showed cardiomegaly, bilateral interstitial infiltrates.  He is hypoxic on examination requiring supplemental oxygen uptitrated to 5 L/min. Profound anasarca present. Wheezing reported on initial exam - not wheezing on my evaluation.  He received diuretics, was empirically started on corticosteroids, doxycycline, bronchodilators.  Through the day has had desaturations, associated confusion.  ABG showed hypercapnic and hypoxic respiratory failure with respiratory acidosis.  BiPAP was initiated.  PCCM consulted to assist.  Past Medical History   Past Medical History:  Diagnosis Date  . Atrial fibrillation ([place])   . Bronchitis    hx  . Cataract    right eye  . DDD (degenerative disc disease), lumbosacral   . Dysrhythmia    irregular heatbeat  . Eye worm    right eye  . Gout   . History of kidney stones   . Hypertension   . Left knee DJD   . Morbid obesity with BMI of 40.0-44.9, adult ([place])   . Pneumonia    hx  . Prediabetes   . Radicular syndrome of left leg   . S/P total knee replacement    right  . Sleep apnea    can't use  sleep study 10 yrs ago  . 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      Significant Hospital Events   Admitted on BiPAP 11/21  Consults:  PCCM  Procedures:  Echocardiogram 11/21 >>   Significant Diagnostic Tests:    Micro Data:  SARS CoV2 11/21 >> negative Flu a and B 11/21 >> negative  Antimicrobials:  Doxycycline 11/21 >>   Interim history/subjective:  BiPAP in place  Objective   Blood pressure (!) 148/82, pulse 99, temperature 97.9 F (36.6 C), temperature source Oral, resp. rate (!) 23, height 5' 10.5" (1.791 m), weight (!) 145.2 kg, SpO2 94 %.    Vent Mode: BIPAP;PCV FiO2 (%):  [40 %] 40 % Set Rate:  [20 bmp] 20 bmp PEEP:  [5 cmH20] 5 cmH20   Intake/Output Summary (Last 24 hours) at 03/09/2020 1759 Last data filed at 03/09/2020 1549 Gross per 24 hour  Intake --  Output 600 ml  Net -600 ml   Filed Weights   03/09/20 1156  Weight: (!) 145.2 kg    Examination: General: Ill-appearing morbidly obese man laying in bed on BiPAP HENT: Full facemask BiPAP in place, he has dentures that need to be removed, no stridor Lungs: Bilateral inspiratory crackles, no wheezing Cardiovascular: Distant, regular, no murmur heard Abdomen: Obese and protuberant, nondistended, somewhat tamponaded, positive bowel sounds Extremities: 2-3+ pitting edema lower extremities up to the thigh Neuro: Awake, answers questions, follows commands, a bit difficult to understand.  Some confusion with a history GU: Not examined  Resolved Hospital Problem list     Assessment & Plan:   Acute on chronic hypoxic and hypercapnic respiratory failure.  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  Significant contributor poorly controlled superimposed OSA/OHS, consider also possible COPD.   I do not hear any evidence of wheezing or an acute exacerbation on my exam although this may have been heard earlier.  Likely component of hypertension and acute diastolic CHF. He is tolerating BiPAP, protecting his airway, is a bit hypertensive but otherwise hemodynamically stable.  OSA/OHS  Recs: -Agree with BiPAP, continuous for now -No clear indication for ET intubation, MV. -Follow mental status and airway protection closely, currently a good candidate for BiPAP -Follow ABG and chest x-ray for interval improvement -Diuresis as blood pressure and renal function will tolerate -Will stop Pulmicort, change scheduled BD to DuoNeb  -Not clear to me that there is any indication to continue corticosteroids, likely stop on 11/22 -No clear evidence for infection, consider rapid DC doxycycline -Tight blood pressure control, note carvedilol ordered -Eliquis for A. fib -Await echocardiogram results -He will need formal PFTs once stabilized from this acute episode. -If true obstruction then will benefit from scheduled bronchodilator therapy at home once discharged -Suspect he may have chronic hypoxemia, may be discharged on oxygen.  Will need to be formally assessed for hypoxemia once he has been diuresed and has reached target volume status    Best practice:  Diet: NPO  Pain/Anxiety/Delirium protocol (if indicated): Minimize sedating medications VAP protocol (if indicated): N/A DVT prophylaxis: Eliquis GI prophylaxis: With start PPI Glucose control: SSI Mobility: Bedrest Code Status: Full Family Communication: Wife updated by [facility] 11/21 Disposition: Progressive care  Labs   CBC: Recent Labs  Lab 03/09/20 1228 03/09/20 1618  WBC 8.8  --   NEUTROABS 6.3  --   HGB 14.8 16.3  HCT 47.8 48.0  MCV 101.7*  --   PLT 188  --     Basic Metabolic Panel: Recent Labs  Lab 03/09/20 1228 03/09/20 1618  NA 136 138  K 4.1 4.4  CL 98  --   CO2 30  --   GLUCOSE 141*  --   BUN 9  --    CREATININE 0.86  --   CALCIUM 8.6*  --    GFR: Estimated Creatinine Clearance: 106 mL/min (by C-G formula based on SCr of 0.86 mg/dL). Recent Labs  Lab 03/09/20 1228  WBC 8.8    Liver Function Tests: Recent Labs  Lab 03/09/20 1228  AST 21  ALT 19  ALKPHOS 57  BILITOT 0.6  PROT 6.4*  ALBUMIN 3.0*   No results for input(s): LIPASE, AMYLASE in the last 168 hours. No results for input(s): AMMONIA in the last 168 hours.  ABG    Component Value Date/Time   PHART 7.204 (L) 03/09/2020 1618   PCO2ART 95.3 (HH) 03/09/2020 1618   PO2ART 79 (L) 03/09/2020 1618   HCO3 37.6 (H) 03/09/2020 1618   TCO2 40 (H) 03/09/2020 1618   O2SAT 91.0 03/09/2020 1618     Coagulation Profile: No results for input(s): INR, PROTIME in the last 168 hours.  Cardiac Enzymes: No results for input(s): CKTOTAL, CKMB, CKMBINDEX, TROPONINI in the last 168 hours.  HbA1C: Hgb A1c MFr Bld  Date/Time Value Ref Range Status  07/23/2016 08:05  AM 5.4 <5.7 % Final    Comment:      For the purpose of screening for the presence of diabetes:   <5.7%       Consistent with the absence of diabetes 5.7-6.4 %   Consistent with increased risk for diabetes (prediabetes) >=6.5 %     Consistent with diabetes   This assay result is consistent with a decreased risk of diabetes.   Currently, no consensus exists regarding use of hemoglobin A1c for diagnosis of diabetes in children.   According to American Diabetes Association (ADA) guidelines, hemoglobin A1c <7.0% represents optimal control in non-pregnant diabetic patients. Different metrics may apply to specific patient populations. Standards of Medical Care in Diabetes (ADA).       CBG: No results for input(s): GLUCAP in the last 168 hours.  Review of Systems:   As per HPI  Past Medical History  He,  has a past medical history of Atrial fibrillation ([place]), Bronchitis, Cataract, DDD (degenerative disc disease), lumbosacral, Dysrhythmia, Eye worm, Gout,  History of kidney stones, Hypertension, Left knee DJD, Morbid obesity with BMI of 40.0-44.9, adult ([place]), Pneumonia, Prediabetes, Radicular syndrome of left leg, S/P total knee replacement, Sleep apnea, and 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.   Surgical History    Past Surgical History:  Procedure Laterality Date  . CARDIOVERSION N/A 03/04/2015   Procedure: CARDIOVERSION;  Surgeon: [name], MD;  Location: [place];  Service: Cardiovascular;  Laterality: N/A;  . CHOLECYSTECTOMY    . COLONOSCOPY N/A 01/01/2016   Procedure: COLONOSCOPY;  Surgeon: [name], MD;  [facility];  Service: Endoscopy;  Laterality: N/A;  1:45  . COLONOSCOPY N/A 01/14/2017   Procedure: COLONOSCOPY;  Surgeon: [name], MD;  [facility];  Service: Endoscopy;  Laterality: N/A;  12:05  . EYE EXAMINATION UNDER ANESTHESIA W/ RETINAL CRYOTHERAPY AND RETINAL LASER  07/2011  . EYE SURGERY  02/2011   cat rt  . KNEE ARTHROSCOPY     bilateral  . LUMBAR LAMINECTOMY/DECOMPRESSION MICRODISCECTOMY N/A 08/18/2017   Procedure: Bilateral microlumbar decompression Lumbar four-Lumbar five;  Surgeon: [name], MD;  Location: [place];  Service: Orthopedics;  Laterality: N/A;  . POLYPECTOMY  01/01/2016   Procedure: POLYPECTOMY;  Surgeon: [name], MD;  [facility];  Service: Endoscopy;;  colon  . RETINAL DETACHMENT SURGERY  03/2011   rt  . TOTAL KNEE ARTHROPLASTY  2003   rt  . TOTAL KNEE ARTHROPLASTY  02/07/2012   Procedure: TOTAL KNEE ARTHROPLASTY;  Surgeon: [name], MD;  Location: [place];  Service: Orthopedics;  Laterality: Left;     Social History   reports that he quit smoking about 28 years ago. His smoking use included cigarettes. He has a 30.00 pack-year smoking history. He has never used smokeless tobacco. He reports current alcohol use. He reports that he does not use drugs.   Family History   His family history includes Lung cancer in his father and sister.     Allergies No Known Allergies   Home Medications  Prior to Admission medications   Medication Sig Start Date End Date Taking? Authorizing Provider  acetaminophen (TYLENOL) 325 MG tablet Take 2 tablets (650 mg total) by mouth every 6 (six) hours as needed (or Fever >/= 101). 02/08/12  Yes Shepperson, Kirstin, PA-C  allopurinol (ZYLOPRIM) 300 MG tablet TAKE 1 TABLET (300 MG TOTAL) BY MOUTH DAILY. 12/31/19  Yes [name], MD  apixaban (ELIQUIS) 5 MG  TABS tablet Take 1 tablet (5 mg total) by mouth 2 (two) times daily. 07/03/19  Yes [name], MD  carvedilol (COREG) 12.5 MG tablet TAKE 1 & 1/2 TABLETS BY MOUTH EVERY MORNING AND 1 TABLET IN THE EVENING Patient taking differently: Take 12.5-18.75 mg by mouth See admin instructions. TAKE 18.75mg  BY MOUTH EVERY MORNING AND 12.5 IN THE EVENING 04/03/19  Yes [name], MD  fluticasone (FLONASE) 50 MCG/ACT nasal spray SHAKE LIQUID AND USE 2 SPRAYS IN EACH NOSTRIL DAILY Patient taking differently: Place 2 sprays into both nostrils daily as needed for allergies or rhinitis.  04/23/19  Yes [name], MD  gabapentin (NEURONTIN) 300 MG capsule Take 600 mg by mouth at bedtime. 03/06/20  Yes [provider]  HYDROcodone-acetaminophen (NORCO) 10-325 MG tablet Take 1 tablet by mouth 3 (three) times daily as needed for pain. 03/06/20  Yes [provider]  temazepam (RESTORIL) 30 MG capsule TAKE 1 CAPSULE BY MOUTH AT BEDTIME AS NEEDED FOR SLEEP. Patient taking differently: Take 30 mg by mouth at bedtime.  12/31/19  Yes [name], MD  ELIQUIS 5 MG TABS tablet TAKE 1 TABLET BY MOUTH TWICE A DAY Patient not taking: Reported on 03/09/2020 07/06/19   [name], MD  hydrochlorothiazide (HYDRODIURIL) 25 MG tablet Take 0.5 tablets (12.5 mg total) by mouth daily. Patient not taking: Reported on 03/09/2020 03/02/19   [name], MD     Critical care time: 40 minutes     Baltazar Apo, MD, PhD 03/09/2020, 6:27  PM [facility] [phone] or if no answer [phone]

## 2020-03-09 NOTE — ED Notes (Signed)
Dinner Tray Ordered 1650 -ms

## 2020-03-09 NOTE — Progress Notes (Signed)
Patient arrived into room 3E19 on Bipap. 40% oxygen. Agitated at times.Trying to take off Bipap. Connected to cardiac monitoring. Oriented to patient room and environment.Call bell within patient reach.[name]

## 2020-03-09 NOTE — ED Notes (Signed)
Came into pt room and noted that pt O2 sats were in the 50's. Pt had removed [place] for unknown time. Replaced cannula and instructed pt on deep breathing in his nose. Pt then came back up to low 90's on 5L. Admitting provider at bedside and aware.

## 2020-03-10 ENCOUNTER — Inpatient Hospital Stay (HOSPITAL_COMMUNITY): Payer: PPO

## 2020-03-10 DIAGNOSIS — R7303 Prediabetes: Secondary | ICD-10-CM

## 2020-03-10 DIAGNOSIS — I509 Heart failure, unspecified: Secondary | ICD-10-CM | POA: Diagnosis not present

## 2020-03-10 DIAGNOSIS — G9341 Metabolic encephalopathy: Secondary | ICD-10-CM

## 2020-03-10 DIAGNOSIS — M1990 Unspecified osteoarthritis, unspecified site: Secondary | ICD-10-CM

## 2020-03-10 DIAGNOSIS — I5021 Acute systolic (congestive) heart failure: Secondary | ICD-10-CM | POA: Diagnosis not present

## 2020-03-10 LAB — BLOOD GAS, ARTERIAL
Acid-Base Excess: 9.6 mmol/L — ABNORMAL HIGH (ref 0.0–2.0)
Bicarbonate: 35.9 mmol/L — ABNORMAL HIGH (ref 20.0–28.0)
Drawn by: 33100
FIO2: 40
O2 Saturation: 93.5 %
Patient temperature: 36.5
pCO2 arterial: 70.8 mmHg (ref 32.0–48.0)
pH, Arterial: 7.322 — ABNORMAL LOW (ref 7.350–7.450)
pO2, Arterial: 68.9 mmHg — ABNORMAL LOW (ref 83.0–108.0)

## 2020-03-10 LAB — ECHOCARDIOGRAM COMPLETE
AR max vel: 3.25 cm2
AV Area VTI: 3.88 cm2
AV Area mean vel: 3.06 cm2
AV Mean grad: 2 mmHg
AV Peak grad: 3.4 mmHg
Ao pk vel: 0.93 m/s
Height: 71 in
S' Lateral: 2.8 cm
Weight: 5044.12 oz

## 2020-03-10 LAB — GLUCOSE, CAPILLARY
Glucose-Capillary: 115 mg/dL — ABNORMAL HIGH (ref 70–99)
Glucose-Capillary: 148 mg/dL — ABNORMAL HIGH (ref 70–99)
Glucose-Capillary: 155 mg/dL — ABNORMAL HIGH (ref 70–99)
Glucose-Capillary: 178 mg/dL — ABNORMAL HIGH (ref 70–99)

## 2020-03-10 LAB — BASIC METABOLIC PANEL
Anion gap: 12 (ref 5–15)
BUN: 12 mg/dL (ref 8–23)
CO2: 33 mmol/L — ABNORMAL HIGH (ref 22–32)
Calcium: 8.8 mg/dL — ABNORMAL LOW (ref 8.9–10.3)
Chloride: 93 mmol/L — ABNORMAL LOW (ref 98–111)
Creatinine, Ser: 0.93 mg/dL (ref 0.61–1.24)
GFR, Estimated: >60 mL/min (ref >60)
Glucose, Bld: 157 mg/dL — ABNORMAL HIGH (ref 70–99)
Potassium: 4.3 mmol/L (ref 3.5–5.1)
Sodium: 138 mmol/L (ref 135–145)

## 2020-03-10 LAB — HEMOGLOBIN A1C
Hgb A1c MFr Bld: 5.8 % — ABNORMAL HIGH (ref 4.8–5.6)
Mean Plasma Glucose: 119.76 mg/dL

## 2020-03-10 MED ORDER — BOOST / RESOURCE BREEZE PO LIQD CUSTOM
1.0000 | Freq: Three times a day (TID) | ORAL | Status: DC
  Administered 2020-03-11 – 2020-03-12 (×2): 1 via ORAL

## 2020-03-10 MED ORDER — ADULT MULTIVITAMIN W/MINERALS CH
1.0000 | ORAL_TABLET | Freq: Every day | ORAL | Status: DC
  Administered 2020-03-10 – 2020-03-12 (×3): 1 via ORAL
  Filled 2020-03-10 (×2): qty 1

## 2020-03-10 MED ORDER — PERFLUTREN LIPID MICROSPHERE
1.0000 mL | INTRAVENOUS | Status: AC | PRN
  Administered 2020-03-10: 5 mL via INTRAVENOUS
  Filled 2020-03-10: qty 10

## 2020-03-10 MED ORDER — IPRATROPIUM-ALBUTEROL 0.5-2.5 (3) MG/3ML IN SOLN
3.0000 mL | Freq: Three times a day (TID) | RESPIRATORY_TRACT | Status: DC
  Administered 2020-03-10 – 2020-03-12 (×7): 3 mL via RESPIRATORY_TRACT
  Filled 2020-03-10 (×7): qty 3

## 2020-03-10 MED FILL — Perflutren Lipid Microsphere IV Susp 1.1 MG/ML: INTRAVENOUS | Qty: 10 | Status: AC

## 2020-03-10 NOTE — Progress Notes (Signed)
PT Cancellation Note  Patient Details Name: Garrett Munoz MRN: [id number] DOB: [birth year]   Cancelled Treatment:    Reason Eval/Treat Not Completed: Medical issues which prohibited therapy.  Pt is experiencing high CO2 and on bipap, will reattempt as time and pt allow.   Ramond Dial 03/10/2020, 10:02 AM   Mee Hives, PT MS Acute Rehab Dept. Number: [place] [facility] [place]

## 2020-03-10 NOTE — Assessment & Plan Note (Addendum)
-  Likely multifactorial in setting of some volume overload as well as underlying probable obstructive lung disease. Needs to be adequately diuresed then obtain outpatient PFTs -Required continuous BiPAP on admission which has now been weaned off. Continue nightly BiPAP as well as as needed -Continue Lasix -Appreciate pulmonology evaluation -Discontinued doxycycline and steroids -Continue duo nebs for now

## 2020-03-10 NOTE — Progress Notes (Signed)
  Echocardiogram 2D Echocardiogram has been performed with Definity.  Garrett Munoz 03/10/2020, 9:45 AM

## 2020-03-10 NOTE — Progress Notes (Signed)
NAME:  Garrett Munoz, MRN:  [id number], DOB:  November 03, 1945, LOS: 1 ADMISSION DATE:  03/09/2020, CONSULTATION DATE: 03/09/2020 REFERRING MD: Dr. Roosevelt Locks, CHIEF COMPLAINT: Acute on chronic respiratory failure  Brief History     History of present illness   74 year old obese former smoker, chronically ill man with a history of marginally treated OSA, chronic atrial fibrillation on anticoagulation, hypertension, restrictive/obstructive disease based on spirometry done 06/04/2019, presumed COPD.  Presented to the emergency department 11/21 with acute worsening of bilateral lower extremity edema for 3 to 4 weeks, anasarca, slowly progressive dyspnea for 1 week.  He has had some increased cough.  Apparently has albuterol nebs available although not illness intake home medicine list.  He has been using these more frequently over the last week.  In the ED chest x-ray showed cardiomegaly, bilateral interstitial infiltrates.  He is hypoxic on examination requiring supplemental oxygen uptitrated to 5 L/min. Profound anasarca present. Wheezing reported on initial exam - not wheezing on my evaluation.  He received diuretics, was empirically started on corticosteroids, doxycycline, bronchodilators.  Through the day has had desaturations, associated confusion.  ABG showed hypercapnic and hypoxic respiratory failure with respiratory acidosis.  BiPAP was initiated.  PCCM consulted to assist.  Past Medical History   Past Medical History:  Diagnosis Date  . Atrial fibrillation ([place])   . Bronchitis    hx  . Cataract    right eye  . DDD (degenerative disc disease), lumbosacral   . Dysrhythmia    irregular heatbeat  . Eye worm    right eye  . Gout   . History of kidney stones   . Hypertension   . Left knee DJD   . Morbid obesity with BMI of 40.0-44.9, adult ([place])   . Pneumonia    hx  . Prediabetes   . Radicular syndrome of left leg   . S/P total knee replacement    right  . Sleep apnea    can't use  sleep study 10 yrs ago  . 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      Significant Hospital Events   Admitted on BiPAP 11/21  Consults:  PCCM  Procedures:  Echocardiogram 11/21 >>   Significant Diagnostic Tests:    Micro Data:  SARS CoV2 11/21 >> negative Flu a and B 11/21 >> negative  Antimicrobials:  Doxycycline 11/21 >>   Interim history/subjective:  Feels better. Peed a lot. Breathing comfortably on [place].  Objective   Blood pressure 126/89, pulse 91, temperature (!) 97.5 F (36.4 C), temperature source Axillary, resp. rate 17, height 5\' 11"  (1.803 m), weight (!) 143 kg, SpO2 92 %.    Vent Mode: BIPAP;PCV FiO2 (%):  [40 %] 40 % Set Rate:  [20 bmp] 20 bmp PEEP:  [5 cmH20] 5 cmH20   Intake/Output Summary (Last 24 hours) at 03/10/2020 1122 Last data filed at 03/10/2020 0300 Gross per 24 hour  Intake 0.31 ml  Output 1825 ml  Net -1824.69 ml   Filed Weights   03/09/20 1156 03/09/20 1816 03/10/20 0406  Weight: (!) 145.2 kg (!) 144.3 kg (!) 143 kg    Examination: General: Ill-appearing morbidly obese man laying in bed on BiPAP HENT: Full facemask BiPAP in place, he has dentures that need to be removed, no stridor Lungs: Bilateral inspiratory crackles, no wheezing Cardiovascular: Distant, regular, no murmur heard Abdomen: Obese and protuberant, nondistended, somewhat tamponaded, positive bowel sounds Extremities: 2-3+ pitting edema lower extremities up to the thigh Neuro: Awake, answers questions, follows  commands, a bit difficult to understand.  Some confusion with a history GU: Not examined  Resolved Hospital Problem list     Assessment & Plan:   Acute on chronic hypoxic and hypercapnic respiratory failure.  Acute R heart failure / cor pulmonale, secondary PAH OSA / OHS Acute on chronic HFpEF due to A Fib, HTN Presumed COPD, without clear evidence AE Anasarca Multifactorial resp failure, principally due to acute decompensated cor pulmonale, cardiogenic pulmonary  edema.  Significant contributor poorly controlled superimposed OSA/OHS, consider also possible COPD.  I do not hear any evidence of wheezing or an acute exacerbation on my exam although this may have been heard earlier.  Likely component of hypertension and acute diastolic CHF. He is tolerating BiPAP, protecting his airway, is a bit hypertensive but otherwise hemodynamically stable.   Recs: -Agree with BiPAP, PRN and at night -Diuresis as blood pressure and renal function will tolerate -Will stop Pulmicort, change scheduled BD to DuoNeb  -d/c steroids, doxy 11/22 -Eliquis for A. fib -Await echocardiogram results -He will need formal PFTs once stabilized from this acute episode. -If true obstruction then will benefit from scheduled bronchodilator therapy at home once discharged -Suspect he may have chronic hypoxemia given mild polycythemia, may be discharged on oxygen.  Will need to be formally assessed for hypoxemia once he has been diuresed and has reached target volume status    Best practice:  Per primary  Labs   CBC: Recent Labs  Lab 03/09/20 1228 03/09/20 1618  WBC 8.8  --   NEUTROABS 6.3  --   HGB 14.8 16.3  HCT 47.8 48.0  MCV 101.7*  --   PLT 188  --     Basic Metabolic Panel: Recent Labs  Lab 03/09/20 1228 03/09/20 1618 03/10/20 0426  NA 136 138 138  K 4.1 4.4 4.3  CL 98  --  93*  CO2 30  --  33*  GLUCOSE 141*  --  157*  BUN 9  --  12  CREATININE 0.86  --  0.93  CALCIUM 8.6*  --  8.8*   GFR: Estimated Creatinine Clearance: 97.9 mL/min (by C-G formula based on SCr of 0.93 mg/dL). Recent Labs  Lab 03/09/20 1228  WBC 8.8    Liver Function Tests: Recent Labs  Lab 03/09/20 1228  AST 21  ALT 19  ALKPHOS 57  BILITOT 0.6  PROT 6.4*  ALBUMIN 3.0*   No results for input(s): LIPASE, AMYLASE in the last 168 hours. No results for input(s): AMMONIA in the last 168 hours.  ABG    Component Value Date/Time   PHART 7.322 (L) 03/10/2020 0429   PCO2ART  70.8 (HH) 03/10/2020 0429   PO2ART 68.9 (L) 03/10/2020 0429   HCO3 35.9 (H) 03/10/2020 0429   TCO2 40 (H) 03/09/2020 1618   O2SAT 93.5 03/10/2020 0429     Coagulation Profile: No results for input(s): INR, PROTIME in the last 168 hours.  Cardiac Enzymes: No results for input(s): CKTOTAL, CKMB, CKMBINDEX, TROPONINI in the last 168 hours.  HbA1C: Hgb A1c MFr Bld  Date/Time Value Ref Range Status  03/10/2020 04:26 AM 5.8 (H) 4.8 - 5.6 % Final    Comment:    (NOTE) Pre diabetes:          5.7%-6.4%  Diabetes:              >6.4%  Glycemic control for   <7.0% adults with diabetes   07/23/2016 08:05 AM 5.4 <5.7 % Final  Comment:      For the purpose of screening for the presence of diabetes:   <5.7%       Consistent with the absence of diabetes 5.7-6.4 %   Consistent with increased risk for diabetes (prediabetes) >=6.5 %     Consistent with diabetes   This assay result is consistent with a decreased risk of diabetes.   Currently, no consensus exists regarding use of hemoglobin A1c for diagnosis of diabetes in children.   According to American Diabetes Association (ADA) guidelines, hemoglobin A1c <7.0% represents optimal control in non-pregnant diabetic patients. Different metrics may apply to specific patient populations. Standards of Medical Care in Diabetes (ADA).       CBG: Recent Labs  Lab 03/09/20 1839 03/09/20 2004 03/10/20 0016 03/10/20 0405  GLUCAP 128* 123* 178* 155*    Review of Systems:   As per HPI  Past Medical History  He,  has a past medical history of Atrial fibrillation ([place]), Bronchitis, Cataract, DDD (degenerative disc disease), lumbosacral, Dysrhythmia, Eye worm, Gout, History of kidney stones, Hypertension, Left knee DJD, Morbid obesity with BMI of 40.0-44.9, adult ([place]), Pneumonia, Prediabetes, Radicular syndrome of left leg, S/P total knee replacement, Sleep apnea, and 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.   Surgical History    Past Surgical  History:  Procedure Laterality Date  . CARDIOVERSION N/A 03/04/2015   Procedure: CARDIOVERSION;  Surgeon: [name], MD;  Location: [facility];  Service: Cardiovascular;  Laterality: N/A;  . CHOLECYSTECTOMY    . COLONOSCOPY N/A 01/01/2016   Procedure: COLONOSCOPY;  Surgeon: [name], MD;  [facility];  Service: Endoscopy;  Laterality: N/A;  1:45  . COLONOSCOPY N/A 01/14/2017   Procedure: COLONOSCOPY;  Surgeon: [name], MD;  [facility];  Service: Endoscopy;  Laterality: N/A;  12:05  . EYE EXAMINATION UNDER ANESTHESIA W/ RETINAL CRYOTHERAPY AND RETINAL LASER  07/2011  . EYE SURGERY  02/2011   cat rt  . KNEE ARTHROSCOPY     bilateral  . LUMBAR LAMINECTOMY/DECOMPRESSION MICRODISCECTOMY N/A 08/18/2017   Procedure: Bilateral microlumbar decompression Lumbar four-Lumbar five;  Surgeon: [name], MD;  Location: [place];  Service: Orthopedics;  Laterality: N/A;  . POLYPECTOMY  01/01/2016   Procedure: POLYPECTOMY;  Surgeon: [name], MD;  [facility];  Service: Endoscopy;;  colon  . RETINAL DETACHMENT SURGERY  03/2011   rt  . TOTAL KNEE ARTHROPLASTY  2003   rt  . TOTAL KNEE ARTHROPLASTY  02/07/2012   Procedure: TOTAL KNEE ARTHROPLASTY;  Surgeon: [name], MD;  Location: [facility];  Service: Orthopedics;  Laterality: Left;     Social History   reports that he quit smoking about 28 years ago. His smoking use included cigarettes. He has a 30.00 pack-year smoking history. He has never used smokeless tobacco. He reports current alcohol use. He reports that he does not use drugs.   Family History   His family history includes Lung cancer in his father and sister.   Allergies No Known Allergies   Home Medications  Prior to Admission medications   Medication Sig Start Date End Date Taking? Authorizing Provider  acetaminophen (TYLENOL) 325 MG tablet Take 2 tablets (650 mg total) by mouth every 6 (six) hours as needed (or Fever >/=  101). 02/08/12  Yes Shepperson, Kirstin, PA-C  allopurinol (ZYLOPRIM) 300 MG tablet TAKE 1 TABLET (300 MG TOTAL) BY MOUTH DAILY. 12/31/19  Yes [name], MD  apixaban Arne [place]) 5  MG TABS tablet Take 1 tablet (5 mg total) by mouth 2 (two) times daily. 07/03/19  Yes [name], MD  carvedilol (COREG) 12.5 MG tablet TAKE 1 & 1/2 TABLETS BY MOUTH EVERY MORNING AND 1 TABLET IN THE EVENING Patient taking differently: Take 12.5-18.75 mg by mouth See admin instructions. TAKE 18.75mg  BY MOUTH EVERY MORNING AND 12.5 IN THE EVENING 04/03/19  Yes [name], MD  fluticasone (FLONASE) 50 MCG/ACT nasal spray SHAKE LIQUID AND USE 2 SPRAYS IN EACH NOSTRIL DAILY Patient taking differently: Place 2 sprays into both nostrils daily as needed for allergies or rhinitis.  04/23/19  Yes [name], MD  gabapentin (NEURONTIN) 300 MG capsule Take 600 mg by mouth at bedtime. 03/06/20  Yes [provider]  HYDROcodone-acetaminophen (NORCO) 10-325 MG tablet Take 1 tablet by mouth 3 (three) times daily as needed for pain. 03/06/20  Yes [provider]  temazepam (RESTORIL) 30 MG capsule TAKE 1 CAPSULE BY MOUTH AT BEDTIME AS NEEDED FOR SLEEP. Patient taking differently: Take 30 mg by mouth at bedtime.  12/31/19  Yes [name], MD  ELIQUIS 5 MG TABS tablet TAKE 1 TABLET BY MOUTH TWICE A DAY Patient not taking: Reported on 03/09/2020 07/06/19   [name], MD  hydrochlorothiazide (HYDRODIURIL) 25 MG tablet Take 0.5 tablets (12.5 mg total) by mouth daily. Patient not taking: Reported on 03/09/2020 03/02/19   [name], MD     Critical care time: 40 minutes     Baltazar Apo, MD, PhD 03/10/2020, 11:22 AM [facility] [phone] or if no answer [phone]

## 2020-03-10 NOTE — Progress Notes (Signed)
Initial Nutrition Assessment  DOCUMENTATION CODES:   Morbid obesity  INTERVENTION:   -Boost Breeze po TID, each supplement provides 250 kcal and 9 grams of protein -MVI with minerals daily -RD will follow for diet advancement and adjust supplement regimen as appropriate  NUTRITION DIAGNOSIS:   Increased nutrient needs related to chronic illness (CHF) as evidenced by estimated needs.  GOAL:   Patient will meet greater than or equal to 90% of their needs  MONITOR:   PO intake, Supplement acceptance, Diet advancement, Labs, Weight trends, Skin, I & O's  REASON FOR ASSESSMENT:   Low [place]    ASSESSMENT:   Garrett Munoz is a 74 y.o. male with medical history significant of chronic A. fib on Eliquis, multiple joints OA, chronic bronchitis, morbid obesity, hypertension, gout, presented with multiple complaints.  Pt admitted with CHF.   Reviewed I/O's: -1.8 L x 24 hours  UOP: 1.8 L x 24 hours  Pt resting quietly at time of visit. He did not respond to voice.   Noted pt had just been advanced to a clear liquid diet. No meal completion data currently available to assess.   Pt no longer requiring Bi-pap.   Reviewed wt hx; wt has been stable over the past year.  Medications reviewed and include lasix.   Labs reviewed: CBGS: 148 (inpatient orders for glycemic control are 0-9 units insulin aspart TID with meals).   NUTRITION - FOCUSED PHYSICAL EXAM:    Most Recent Value  Orbital Region No depletion  Upper Arm Region No depletion  Thoracic and Lumbar Region No depletion  Buccal Region No depletion  Temple Region No depletion  Clavicle Bone Region No depletion  Clavicle and Acromion Bone Region No depletion  Scapular Bone Region No depletion  Dorsal Hand No depletion  Patellar Region No depletion  Anterior Thigh Region No depletion  Posterior Calf Region No depletion  Edema (RD Assessment) Moderate  Hair Reviewed  Eyes Reviewed  Mouth Reviewed  Skin Reviewed   Nails Reviewed       Diet Order:   Diet Order            Diet clear liquid Room service appropriate? Yes; Fluid consistency: Thin  Diet effective now                 EDUCATION NEEDS:   No education needs have been identified at this time  Skin:  Skin Assessment: Reviewed RN Assessment  Last BM:  Unknown  Height:   Ht Readings from Last 1 Encounters:  03/09/20 5\' 11"  (1.803 m)    Weight:   Wt Readings from Last 1 Encounters:  03/10/20 (!) 143 kg   BMI:  Body mass index is 43.97 kg/m.  Estimated Nutritional Needs:   Kcal:  2150-2350  Protein:  120-135 grams  Fluid:  > 2 L    Loistine Chance, RD, LDN, [place] Registered Dietitian II Certified Diabetes Care and Education Specialist Please refer to [facility] for RD and/or RD on-call/weekend/after hours pager

## 2020-03-10 NOTE — Assessment & Plan Note (Addendum)
-  Patient had clinical signs of volume overload on admission. BNP elevated, 261 -Echo also obtained on admission: EF 60 to 65%, severe LVH. Diastolic dysfunction could not be evaluated. RV systolic function mildly reduced. No RVH. - continue lasix

## 2020-03-10 NOTE — Assessment & Plan Note (Signed)
-  Uric acid level 4.3. Continue to avoid HCTZ

## 2020-03-10 NOTE — Progress Notes (Signed)
Pt refused BiPAP for the night.

## 2020-03-10 NOTE — Progress Notes (Signed)
Called to room multiple times to readjust bipap mask.  Readjusted and teaching provided regarding importance of bipap and seal.  Respiratory in and removed mask at this time.  Patient is resting in bed without complaint or s/s of distress.

## 2020-03-10 NOTE — Assessment & Plan Note (Signed)
-  Continue Coreg and Eliquis 

## 2020-03-10 NOTE — Evaluation (Signed)
Physical Therapy Evaluation Patient Details Name: Garrett Munoz MRN: [id number] DOB: 05/22/1945 Today's Date: 03/10/2020   History of Present Illness  74 yo male with onset of SOB and LE edema was admitted to hosp, noted back pain, cardiomegaly, Pulm edema, hypoxia.  Has volume overload, acute new CHF, acute metabolic encephalopathy, oversedation,   Clinical Impression  Pt was seen for mobility with HHA to steady, with O2 sats steadily declining with movement.  Pt is mainly deconditioned for cardiac function, but LE strength is [facility] with minor stiffness of ankles and hips.  Follow acutely to work on safety with all gait, balance skills and to increase awareness of his loss of sats.  Pt was not on O2 before, very limited self awareness of when sats start to decline.  Follow for goals of PT.    Follow Up Recommendations SNF    Equipment Recommendations  Rolling walker with 5" wheels    Recommendations for Other Services       Precautions / Restrictions Precautions Precautions: Fall Precaution Comments: monitor O2 sats, 6L O2 Restrictions Weight Bearing Restrictions: No      Mobility  Bed Mobility Overal bed mobility: Needs Assistance Bed Mobility: Supine to Sit;Sit to Supine     Supine to sit: Min assist Sit to supine: Min assist   General bed mobility comments: Pt was able to be assisted with bed rail and trunk support    Transfers Overall transfer level: Needs assistance   Transfers: Sit to/from Stand Sit to Stand: Min guard            Ambulation/Gait Ambulation/Gait assistance: Min guard Gait Distance (Feet): 75 Feet (25 x 3) Assistive device: 1 person hand held assist Gait Pattern/deviations: Step-through pattern;Decreased stride length;Wide base of support Gait velocity: reduced Gait velocity interpretation: <1.31 ft/sec, indicative of household ambulator General Gait Details: pt is walking fairly stable but O2 sats dropped to 88% on 6L   Stairs             Wheelchair Mobility    Modified Rankin (Stroke Patients Only)       Balance Overall balance assessment: Needs assistance Sitting-balance support: Feet supported Sitting balance-Leahy Scale: Good     Standing balance support: Bilateral upper extremity supported;During functional activity Standing balance-Leahy Scale: Fair Standing balance comment: less than fair balance with dynamic balance skills                             Pertinent Vitals/Pain Pain Assessment: No/denies pain    Home Living Family/patient expects to be discharged to:: Private residence Living Arrangements: Spouse/significant other Available Help at Discharge: Family;Available 24 hours/day Type of Home: House Home Access: Level entry     Home Layout: Other (Comment) (split level) Home Equipment: Walker - 2 wheels;Walker - 4 wheels;Cane - single point;Shower seat;Grab bars - toilet;Grab bars - tub/shower;Wheelchair - manual      Prior Function Level of Independence: Independent               Hand Dominance   Dominant Hand: Right    Extremity/Trunk Assessment   Upper Extremity Assessment Upper Extremity Assessment: Overall WFL for tasks assessed    Lower Extremity Assessment Lower Extremity Assessment: Overall WFL for tasks assessed    Cervical / Trunk Assessment Cervical / Trunk Assessment: Kyphotic (mild)  Communication   Communication: No difficulties  Cognition Arousal/Alertness: Awake/alert Behavior During Therapy: WFL for tasks assessed/performed Overall Cognitive Status: Within Functional  Limits for tasks assessed                                        General Comments General comments (skin integrity, edema, etc.): pt is demonstrating a challenge with gait for short trips due to desaturating.  His plan is to work on standing balance control and assess need for AD    Exercises     Assessment/Plan    PT Assessment Patient needs  continued PT services  PT Problem List Decreased range of motion;Decreased activity tolerance;Decreased balance;Decreased mobility;Decreased coordination;Decreased knowledge of use of DME;Decreased safety awareness;Cardiopulmonary status limiting activity;Obesity       PT Treatment Interventions DME instruction;Gait training;Stair training;Functional mobility training;Therapeutic activities;Therapeutic exercise;Balance training;Neuromuscular re-education;Patient/family education    PT Goals (Current goals can be found in the Care Plan section)  Acute Rehab PT Goals Patient Stated Goal: to get stronger and get home PT Goal Formulation: With patient Time For Goal Achievement: 03/24/20 Potential to Achieve Goals: Good    Frequency Min 3X/week   Barriers to discharge Inaccessible home environment split level home    Co-evaluation               AM-PAC PT "6 Clicks" Mobility  Outcome Measure Help needed turning from your back to your side while in a flat bed without using bedrails?: A Little Help needed moving from lying on your back to sitting on the side of a flat bed without using bedrails?: A Little Help needed moving to and from a bed to a chair (including a wheelchair)?: A Little Help needed standing up from a chair using your arms (e.g., wheelchair or bedside chair)?: A Little Help needed to walk in hospital room?: A Little Help needed climbing 3-5 steps with a railing? : A Little 6 Click Score: 18    End of Session Equipment Utilized During Treatment: Gait belt;Oxygen Activity Tolerance: Patient limited by fatigue;Treatment limited secondary to medical complications (Comment) Patient left: in bed;with call bell/phone within reach;with bed alarm set;with nursing/sitter in room Nurse Communication: Mobility status PT Visit Diagnosis: Unsteadiness on feet (R26.81);Difficulty in walking, not elsewhere classified (R26.2);Adult, failure to thrive (R62.7)    Time:  6811-5726 PT Time Calculation (min) (ACUTE ONLY): 38 min   Charges:   PT Evaluation $PT Eval Moderate Complexity: 1 Mod PT Treatments $Gait Training: 8-22 mins $Therapeutic Exercise: 8-22 mins       Ramond Dial 03/10/2020, 8:13 PM  Mee Hives, PT MS Acute Rehab Dept. Number: [place] [facility] [place]

## 2020-03-10 NOTE — Assessment & Plan Note (Signed)
-  Likely due to hypercarbia on admission. This has improved -Continue monitoring for any further mentation changes

## 2020-03-10 NOTE — Assessment & Plan Note (Signed)
-   continue current pain regimen; caution with oversedation

## 2020-03-10 NOTE — Progress Notes (Signed)
CO2 level 70.8, was 88.5 earlier, pt is on cont bipap, will continue to monitor, Thanks [place] [place].

## 2020-03-10 NOTE — Hospital Course (Addendum)
Mr. Sokolowski is a 74 yo male with PMH chronic A. fib on Eliquis, multiple joints OA, chronic bronchitis, morbid obesity, hypertension, gout who presented with SOB and swelling.  Patient has chronic A. fib has been following with cardiologist Dr. Claiborne Billings, most recent visit was January this year.  No history of CHF documented.  And according to patient's wife, patient's A. fib has been fairly controlled.  Recently patient has had worsening of back pain for which he has been following with pain management doctor.  No new medication recent months.   Patient started to have increasing swelling on both legs 3 to 4 weeks ago, along with increasing shortness of breath initially was exertional and became worse this week.  Wife also noticed patient has had increasing cough and wheezing since last week has been using more albuterol nebulizer, patient also has history of OSA not been compliant with CPAP.  Denies any chest pain, no fever chills, no chest pain no urinary symptoms no diarrhea.  Wife also noticed that the patient has been having " fingers nails and toes discoloration recently"  Chest x-ray showed cardiomegaly and pulmonary edema, patient was found to be hypoxic.  Patient became hypoxic in the ED, very agitated remove oxygen and O2 sat ration dropped to upper 50s and then became lethargic. He was eventually placed on BiPAP. He had hypercarbic respiratory failure on blood gas monitoring which improved with ongoing BiPAP therapy. He was also started on diuresis with Lasix given his volume overload. An echo was also ordered. EF 60-65%, Severe LVH.  Mildly reduced right ventricular systolic function. Pulmonology was consulted on admission as well. He will need outpatient formal PFTs. Prior to discharge he was able to be weaned off of oxygen to room air and ambulated well in the hallway with no desaturation events.  He was also arranged for home health as well as N[facility] but has a scheduled sleep study at time of  discharge as well.

## 2020-03-10 NOTE — Assessment & Plan Note (Signed)
-  A1c 5.8% -Continue diet control

## 2020-03-10 NOTE — Progress Notes (Signed)
PROGRESS NOTE    [name]   BWI:[id number]  DOB: [birth year]  DOA: 03/09/2020     1  PCP: [name], MD  CC: SOB, swelling  Hospital Course: Mr. Garrett Munoz is a 74 yo male with PMH chronic A. fib on Eliquis, multiple joints OA, chronic bronchitis, morbid obesity, hypertension, gout who presented with SOB and swelling.  Patient has chronic A. fib has been following with cardiologist Dr. Claiborne Billings, most recent visit was January this year.  No history of CHF documented.  And according to patient's wife, patient's A. fib has been fairly controlled.  Recently patient has had worsening of back pain for which he has been following with pain management doctor.  No new medication recent months.   Patient started to have increasing swelling on both legs 3 to 4 weeks ago, along with increasing shortness of breath initially was exertional and became worse this week.  Wife also noticed patient has had increasing cough and wheezing since last week has been using more albuterol nebulizer, patient also has history of OSA not been compliant with CPAP.  Denies any chest pain, no fever chills, no chest pain no urinary symptoms no diarrhea.  Wife also noticed that the patient has been having " fingers nails and toes discoloration recently"  Chest x-ray showed cardiomegaly and pulmonary edema, patient was found to be hypoxic.  Patient became hypoxic in the ED, very agitated remove oxygen and O2 sat ration dropped to upper 50s and then became lethargic. He was eventually placed on BiPAP. He had hypercarbic respiratory failure on blood gas monitoring which improved with ongoing BiPAP therapy. He was also started on diuresis with Lasix given his volume overload. An echo was also ordered. Pulmonology was consulted on admission as well. He will need outpatient formal PFTs.   Interval History:  Wife bedside this morning. Breathing is a little better overall. He has been voiding well and she also notes that his  legs do look improved. He was taken off of BiPAP this morning and was endorsing feeling hungry and amenable for trial of liquid diet first then advancing if he remains off of BiPAP.  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 Needs to be adequately diuresed then obtain outpatient PFTs -Required continuous BiPAP on admission which has now been weaned off. Continue nightly BiPAP as well as as needed -Continue Lasix -Appreciate pulmonology evaluation -Discontinue doxycycline and steroids -Continue duo nebs for now  Acute CHF (congestive heart failure) ([place]) -Patient had clinical signs of volume overload on admission. BNP elevated, 261 -Echo also obtained on admission: EF 60 to 65%, severe LVH. Diastolic dysfunction could not be evaluated. RV systolic function mildly reduced. No RVH. - continue lasix - strict I&O  Acute metabolic encephalopathy-resolved as of 03/10/2020 -Likely due to hypercarbia on admission. This has improved -Continue monitoring for any further mentation changes  Osteoarthritis - continue current pain regimen; caution with oversedation   Prediabetes -A1c 5.8% -Continue diet control  Atrial fibrillation (HCC) -Continue Coreg and Eliquis  Gout -Uric acid level 4.3. Continue to avoid HCTZ    Antimicrobials: Doxycycline 11/21>>11/22  DVT prophylaxis: Eliquis Code Status: FULL Family Communication: wife bedside Disposition Plan:  Status is: Inpatient  Remains inpatient appropriate because:Ongoing diagnostic testing needed not appropriate for outpatient work up, IV treatments  appropriate due to intensity of illness or inability to take PO and Inpatient level of care appropriate due to severity of illness   Dispo: The patient is from: Home              Anticipated d/c is to: Home              Anticipated d/c date is: 1 day              Patient currently is not medically stable to d/c.       Objective: Blood pressure 126/89, pulse 78, temperature (!) 97.4 F (36.3 C), temperature source Oral, resp. rate 17, height 5\' 11"  (1.803 m), weight (!) 143 kg, SpO2 (!) 72 %.  Examination: General appearance: alert, cooperative and no distress Head: Normocephalic, without obvious abnormality, atraumatic Eyes: EOMI Lungs: distant but mostly clear breath sounds, no wheezing Heart: regular rate and rhythm and S1, S2 normal Abdomen: normal findings: bowel sounds normal Extremities: 1-2 LE edema Skin: mobility and turgor normal Neurologic: Grossly normal  Consultants:   Pulmonology  Procedures:   none  Data Reviewed: I have personally reviewed following labs and imaging studies Results for orders placed or performed during the hospital encounter of 03/09/20 (from the past 24 hour(s))  Urinalysis, Routine w reflex microscopic Urine, Random     Status: Abnormal   Collection Time: 03/09/20  4:34 PM  Result Value Ref Range   Color, Urine STRAW (A) YELLOW   APPearance CLEAR CLEAR   Specific Gravity, Urine 1.008 1.005 - 1.030   pH 5.0 5.0 - 8.0   Glucose, UA NEGATIVE NEGATIVE mg/dL   Hgb urine dipstick NEGATIVE NEGATIVE   Bilirubin Urine NEGATIVE NEGATIVE   Ketones, ur NEGATIVE NEGATIVE mg/dL   Protein, ur NEGATIVE NEGATIVE mg/dL   Nitrite NEGATIVE NEGATIVE   Leukocytes,Ua TRACE (A) NEGATIVE   RBC / HPF 0-5 0 - 5 RBC/hpf   WBC, UA 0-5 0 - 5 WBC/hpf   Bacteria, UA RARE (A) NONE SEEN   Mucus PRESENT    Hyaline Casts, UA PRESENT   Glucose, capillary     Status: Abnormal   Collection Time: 03/09/20  6:39 PM  Result Value Ref Range   Glucose-Capillary 128  (H) 70 - 99 mg/dL  Glucose, capillary     Status: Abnormal   Collection Time: 03/09/20  8:04 PM  Result Value Ref Range   Glucose-Capillary 123 (H) 70 - 99 mg/dL   Comment 1 Notify RN    Comment 2 Document in Chart   Blood gas, arterial     Status: Abnormal   Collection Time: 03/09/20  9:00 PM  Result Value Ref Range   FIO2 40.00    pH, Arterial 7.232 (L) 7.35 - 7.45   pCO2 arterial 88.5 (HH) 32 - 48 mmHg   pO2, Arterial 101 83 - 108 mmHg   Bicarbonate 36.0 (H) 20.0 - 28.0 mmol/L   Acid-Base Excess 8.6 (H) 0.0 - 2.0 mmol/L   O2 Saturation 96.5 %   Patient temperature 36.8    Collection site LEFT RADIAL    Drawn by [id number]    Sample type ARTERIAL DRAW    Allens test (pass/fail) PASS PASS  Glucose, capillary     Status: Abnormal   Collection Time: 03/10/20 12:16 AM  Result Value Ref Range   Glucose-Capillary 178 (H) 70 - 99 mg/dL  Comment 1 Notify RN    Comment 2 Document in Chart   Glucose, capillary     Status: Abnormal   Collection Time: 03/10/20  4:05 AM  Result Value Ref Range   Glucose-Capillary 155 (H) 70 - 99 mg/dL   Comment 1 Notify RN    Comment 2 Document in Chart   Hemoglobin A1c     Status: Abnormal   Collection Time: 03/10/20  4:26 AM  Result Value Ref Range   Hgb A1c MFr Bld 5.8 (H) 4.8 - 5.6 %   Mean Plasma Glucose 119.76 mg/dL  Basic metabolic panel     Status: Abnormal   Collection Time: 03/10/20  4:26 AM  Result Value Ref Range   Sodium 138 135 - 145 mmol/L   Potassium 4.3 3.5 - 5.1 mmol/L   Chloride 93 (L) 98 - 111 mmol/L   CO2 33 (H) 22 - 32 mmol/L   Glucose, Bld 157 (H) 70 - 99 mg/dL   BUN 12 8 - 23 mg/dL   Creatinine, Ser 0.93 0.61 - 1.24 mg/dL   Calcium 8.8 (L) 8.9 - 10.3 mg/dL   GFR, Estimated >60 >60 mL/min   Anion gap 12 5 - 15  Blood gas, arterial     Status: Abnormal   Collection Time: 03/10/20  4:29 AM  Result Value Ref Range   FIO2 40.00    pH, Arterial 7.322 (L) 7.35 - 7.45   pCO2 arterial 70.8 (HH) 32 - 48 mmHg   pO2, Arterial  68.9 (L) 83 - 108 mmHg   Bicarbonate 35.9 (H) 20.0 - 28.0 mmol/L   Acid-Base Excess 9.6 (H) 0.0 - 2.0 mmol/L   O2 Saturation 93.5 %   Patient temperature 36.5    Collection site LEFT RADIAL    Drawn by [id number]    Sample type ARTERIAL DRAW    Allens test (pass/fail) PASS PASS  Glucose, capillary     Status: Abnormal   Collection Time: 03/10/20 12:44 PM  Result Value Ref Range   Glucose-Capillary 148 (H) 70 - 99 mg/dL    Recent Results (from the past 240 hour(s))  Respiratory Panel by RT PCR (Flu A&B, Covid) - Nasopharyngeal Swab     Status: None   Collection Time: 03/09/20  3:39 PM   Specimen: Nasopharyngeal Swab; Nasopharyngeal(NP) swabs in vial transport medium  Result Value Ref Range Status   SARS Coronavirus 2 by RT PCR NEGATIVE NEGATIVE Final    Comment: (NOTE) SARS-CoV-2 target nucleic acids are NOT DETECTED.  The SARS-CoV-2 RNA is generally detectable in upper respiratoy specimens during the acute phase of infection. The lowest concentration of SARS-CoV-2 viral copies this assay can detect is 131 copies/mL. A negative result does not preclude SARS-Cov-2 infection and should not be used as the sole basis for treatment or other patient management decisions. A negative result may occur with  improper specimen collection/handling, submission of specimen other than nasopharyngeal swab, presence of viral mutation(s) within the areas targeted by this assay, and inadequate number of viral copies (<131 copies/mL). A negative result must be combined with clinical observations, patient history, and epidemiological information. The expected result is Negative.  Fact Sheet for Patients:  PinkCheek.be  Fact Sheet for Healthcare Providers:  GravelBags.it  This test is no t yet approved or cleared by the [place] FDA and  has been authorized for detection and/or diagnosis of SARS-CoV-2 by FDA under an Emergency Use  Authorization (EUA). This EUA will remain  in effect (meaning this test  can be used) for the duration of the COVID-19 declaration under Section 564(b)(1) of the Act, 21 U.S.C. section 360bbb-3(b)(1), unless the authorization is terminated or revoked sooner.     Influenza A by PCR NEGATIVE NEGATIVE Final   Influenza B by PCR NEGATIVE NEGATIVE Final    Comment: (NOTE) The Xpert Xpress SARS-CoV-2/FLU/RSV assay is intended as an aid in  the diagnosis of influenza from Nasopharyngeal swab specimens and  should not be used as a sole basis for treatment. Nasal washings and  aspirates are unacceptable for Xpert Xpress SARS-CoV-2/FLU/RSV  testing.  Fact Sheet for Patients: PinkCheek.be  Fact Sheet for Healthcare Providers: GravelBags.it  This test is not yet approved or cleared by the [place] FDA and  has been authorized for detection and/or diagnosis of SARS-CoV-2 by  FDA under an Emergency Use Authorization (EUA). This EUA will remain  in effect (meaning this test can be used) for the duration of the  Covid-19 declaration under Section 564(b)(1) of the Act, 21  U.S.C. section 360bbb-3(b)(1), unless the authorization is  terminated or revoked. Performed at [facility], [place] [address], [place], [place] [address]      Radiology Studies: CT HEAD WO CONTRAST  Result Date: 03/09/2020 CLINICAL DATA:  Mental status change. EXAM: CT HEAD WITHOUT CONTRAST TECHNIQUE: Contiguous axial images were obtained from the base of the skull through the vertex without intravenous contrast. COMPARISON:  None. FINDINGS: Brain: No subdural, epidural, or subarachnoid hemorrhage. Cerebellum, brainstem, and basal cisterns are normal. Ventricles and sulci are unremarkable. Scattered white matter changes are identified. No acute cortical ischemia or infarct identified. No mass effect or midline shift. Vascular: No hyperdense vessel or unexpected  calcification. Skull: Normal. Negative for fracture or focal lesion. Sinuses/Orbits: No acute finding. Other: None. IMPRESSION: Chronic white matter changes.  No acute intracranial abnormalities. Electronically Signed   By: Dorise Bullion III M.D   On: 03/09/2020 17:10   DG Chest Port 1 View  Result Date: 03/10/2020 CLINICAL DATA:  Congestive heart failure EXAM: PORTABLE CHEST 1 VIEW COMPARISON:  03/09/2020 FINDINGS: Shallow inspiration. Mild cardiac enlargement. Diffuse interstitial opacities suggesting edema. No pleural effusions. No pneumothorax. IMPRESSION: Cardiac enlargement with diffuse interstitial edema. Electronically Signed   By: Lucienne Capers M.D.   On: 03/10/2020 06:01   DG Chest Port 1 View  Result Date: 03/09/2020 CLINICAL DATA:  Shortness of breath EXAM: PORTABLE CHEST 1 VIEW COMPARISON:  06/04/2019 FINDINGS: Increased interstitial prominence with ill-defined patchy density. Central pulmonary vascular congestion. No pleural effusion or pneumothorax. Cardiomegaly. IMPRESSION: Findings suggestive of pulmonary edema.  Cardiomegaly. Electronically Signed   By: Macy Mis M.D.   On: 03/09/2020 13:07   ECHOCARDIOGRAM COMPLETE  Result Date: 03/10/2020    ECHOCARDIOGRAM REPORT   Patient Name:   Garrett Munoz Date of Exam: 03/10/2020 Medical Rec #:  [id number]       Height:       71.0 in Accession #:    [id number]      Weight:       315.3 lb Date of Birth:  Apr 28, 1945      BSA:          2.558 m Patient Age:    89 years        BP:           126/89 mmHg Patient Gender: M               HR:  91 bpm. Exam Location:  Inpatient Procedure: 2D Echo, Cardiac Doppler, Color Doppler and Intracardiac            Opacification Agent Indications:    CHF-Acute Systolic  History:        Patient has prior history of Echocardiogram examinations, most                 recent 02/18/2015. Arrythmias:Atrial Fibrillation,                 Signs/Symptoms:Dyspnea; Risk Factors:Former Smoker, Sleep Apnea                  and Hypertension.  Sonographer:    Clayton Lefort RDCS (AE) Referring Phys: [id number] Lequita Halt  Sonographer Comments: Technically challenging study due to limited acoustic windows, Technically difficult study due to poor echo windows, suboptimal parasternal window, suboptimal apical window, no subcostal window and patient is morbidly obese. Image acquisition challenging due to patient body habitus. IMPRESSIONS  1. Left ventricular ejection fraction, by estimation, is 60 to 65%. The left ventricle has normal function. The left ventricle has no regional wall motion abnormalities. There is severe left ventricular hypertrophy. Left ventricular diastolic function could not be evaluated.  2. Right ventricular systolic function is mildly reduced. The right ventricular size is normal.  3. Left atrial size was mild to moderately dilated.  4. The mitral valve is grossly normal. Trivial mitral valve regurgitation.  5. The aortic valve is tricuspid. Aortic valve regurgitation is not visualized.  6. Aortic dilatation noted. 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 There is mild dilatation of the ascending aorta, measuring 40 mm. Comparison(s): Prior images unable to be directly viewed, comparison made by report only. Changes from prior study are noted. 02/18/2015: LVEF 55-60%, moderate LVH, moderate LAE, dilated aorta to 40 mm. FINDINGS  Left Ventricle: Left ventricular ejection fraction, by estimation, is 60 to 65%. The left ventricle has normal function. The left ventricle has no regional wall motion abnormalities. Definity contrast agent was given IV to delineate the left ventricular  endocardial borders. The left ventricular internal cavity size was normal in size. There is severe left ventricular hypertrophy. Left ventricular diastolic function could not be evaluated due to atrial fibrillation. Left ventricular diastolic function could not be evaluated. Right Ventricle: The right  ventricular size is normal. No increase in right ventricular wall thickness. Right ventricular systolic function is mildly reduced. Left Atrium: Left atrial size was mild to moderately dilated. Right Atrium: Right atrial size was normal in size. Pericardium: There is no evidence of pericardial effusion. Mitral Valve: The mitral valve is grossly normal. Trivial mitral valve regurgitation. Tricuspid Valve: The tricuspid valve is grossly normal. Tricuspid valve regurgitation is trivial. Aortic Valve: The aortic valve is tricuspid. Aortic valve regurgitation is not visualized. Aortic valve mean gradient measures 2.0 mmHg. Aortic valve peak gradient measures 3.4 mmHg. Aortic valve area, by VTI measures 3.88 cm. Pulmonic Valve: The pulmonic valve was not well visualized. Pulmonic valve regurgitation is not visualized. Aorta: Aortic dilatation noted. 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 There is mild dilatation of the ascending aorta, measuring 40 mm. Venous: The inferior vena cava was not well visualized. IAS/Shunts: No atrial level shunt detected by color flow Doppler.  LEFT VENTRICLE PLAX 2D LVIDd:         4.20 cm LVIDs:         2.80 cm LV PW:  1.70 cm LV IVS:        1.80 cm LVOT diam:     2.30 cm LV SV:         64 LV SV Index:   25 LVOT Area:     4.15 cm  RIGHT VENTRICLE RV Basal diam:  3.50 cm RV Mid diam:    2.70 cm RV S prime:     8.92 cm/s LEFT ATRIUM             Index       RIGHT ATRIUM           Index LA diam:        3.50 cm 1.37 cm/m  RA Area:     21.20 cm LA Vol (A2C):   72.8 ml 28.45 ml/m RA Volume:   53.10 ml  20.75 ml/m LA Vol (A4C):   95.5 ml 37.33 ml/m LA Biplane Vol: 86.4 ml 33.77 ml/m  AORTIC VALVE AV Area (Vmax):    3.25 cm AV Area (Vmean):   3.06 cm AV Area (VTI):     3.88 cm AV Vmax:           92.70 cm/s AV Vmean:          68.600 cm/s AV VTI:            0.166 m AV Peak Grad:      3.4 mmHg AV Mean Grad:      2.0 mmHg LVOT Vmax:         72.50 cm/s  LVOT Vmean:        50.500 cm/s LVOT VTI:          0.155 m LVOT/AV VTI ratio: 0.93  AORTA Ao Root diam: 4.30 cm Ao Asc diam:  4.00 cm TRICUSPID VALVE TR Peak grad:   34.3 mmHg TR Vmax:        293.00 cm/s  SHUNTS Systemic VTI:  0.16 m Systemic Diam: 2.30 cm Lyman Bishop MD Electronically signed by Lyman Bishop MD Signature Date/Time: 03/10/2020/10:05:12 AM    Final    DG Chest Port 1 View  Final Result    CT HEAD WO CONTRAST  Final Result    DG Chest Port 1 View  Final Result      Scheduled Meds: . apixaban  5 mg Oral BID  . carvedilol  18.75 mg Oral Daily   And  . carvedilol  12.5 mg Oral QHS  . chlorhexidine  15 mL Mouth Rinse BID  . furosemide  40 mg Intravenous Q12H  . gabapentin  600 mg Oral QHS  . insulin aspart  0-9 Units Subcutaneous TID WC  . ipratropium-albuterol  3 mL Nebulization TID  . mouth rinse  15 mL Mouth Rinse q12n4p  . sodium chloride flush  3 mL Intravenous Q12H   PRN Meds: sodium chloride, acetaminophen, fluticasone, HYDROcodone-acetaminophen, ondansetron (ZOFRAN) IV, sodium chloride flush, temazepam Continuous Infusions: . sodium chloride 250 mL (03/09/20 1858)     LOS: 1 day  Time spent: Greater than 50% of the 35 minute visit was spent in counseling/coordination of care for the patient as laid out in the A&P.   Dwyane Dee, MD [facility] 03/10/2020, 4:29 PM

## 2020-03-11 DIAGNOSIS — R5381 Other malaise: Secondary | ICD-10-CM

## 2020-03-11 DIAGNOSIS — J9601 Acute respiratory failure with hypoxia: Secondary | ICD-10-CM

## 2020-03-11 DIAGNOSIS — J9602 Acute respiratory failure with hypercapnia: Secondary | ICD-10-CM

## 2020-03-11 LAB — CBC WITH DIFFERENTIAL/PLATELET
Abs Immature Granulocytes: 0.04 10*3/uL (ref 0.00–0.07)
Basophils Absolute: 0 10*3/uL (ref 0.0–0.1)
Basophils Relative: 0 %
Eosinophils Absolute: 0 10*3/uL (ref 0.0–0.5)
Eosinophils Relative: 0 %
HCT: 44.3 % (ref 39.0–52.0)
Hemoglobin: 14.6 g/dL (ref 13.0–17.0)
Immature Granulocytes: 0 %
Lymphocytes Relative: 11 %
Lymphs Abs: 1.3 10*3/uL (ref 0.7–4.0)
MCH: 31.6 pg (ref 26.0–34.0)
MCHC: 33 g/dL (ref 30.0–36.0)
MCV: 95.9 fL (ref 80.0–100.0)
Monocytes Absolute: 0.9 10*3/uL (ref 0.1–1.0)
Monocytes Relative: 7 %
Neutro Abs: 10.4 10*3/uL — ABNORMAL HIGH (ref 1.7–7.7)
Neutrophils Relative %: 82 %
Platelets: 200 10*3/uL (ref 150–400)
RBC: 4.62 MIL/uL (ref 4.22–5.81)
RDW: 13.2 % (ref 11.5–15.5)
WBC: 12.7 10*3/uL — ABNORMAL HIGH (ref 4.0–10.5)
nRBC: 0 % (ref 0.0–0.2)

## 2020-03-11 LAB — BASIC METABOLIC PANEL
Anion gap: 9 (ref 5–15)
BUN: 16 mg/dL (ref 8–23)
CO2: 35 mmol/L — ABNORMAL HIGH (ref 22–32)
Calcium: 8.9 mg/dL (ref 8.9–10.3)
Chloride: 94 mmol/L — ABNORMAL LOW (ref 98–111)
Creatinine, Ser: 0.95 mg/dL (ref 0.61–1.24)
GFR, Estimated: >60 mL/min (ref >60)
Glucose, Bld: 124 mg/dL — ABNORMAL HIGH (ref 70–99)
Potassium: 3.9 mmol/L (ref 3.5–5.1)
Sodium: 138 mmol/L (ref 135–145)

## 2020-03-11 LAB — GLUCOSE, CAPILLARY
Glucose-Capillary: 117 mg/dL — ABNORMAL HIGH (ref 70–99)
Glucose-Capillary: 115 mg/dL — ABNORMAL HIGH (ref 70–99)
Glucose-Capillary: 101 mg/dL — ABNORMAL HIGH (ref 70–99)
Glucose-Capillary: 113 mg/dL — ABNORMAL HIGH (ref 70–99)

## 2020-03-11 LAB — MAGNESIUM: Magnesium: 2.2 mg/dL (ref 1.7–2.4)

## 2020-03-11 MED ORDER — FUROSEMIDE 10 MG/ML IJ SOLN
40.0000 mg | Freq: Two times a day (BID) | INTRAMUSCULAR | Status: DC
  Administered 2020-03-11 – 2020-03-12 (×3): 40 mg via INTRAVENOUS
  Filled 2020-03-11 (×3): qty 4

## 2020-03-11 MED ORDER — INSULIN ASPART 100 UNIT/ML ~~LOC~~ SOLN: 0.0000 [IU] | Freq: Three times a day (TID) | SUBCUTANEOUS | Status: DC

## 2020-03-11 NOTE — Progress Notes (Signed)
Physical Therapy Treatment Patient Details Name: Garrett Munoz MRN: [id number] DOB: Apr 10, 1946 Today's Date: 03/11/2020    History of Present Illness 74 yo male with onset of SOB and LE edema was admitted to hosp, noted back pain, cardiomegaly, Pulm edema, hypoxia.  Has volume overload, acute new CHF, acute metabolic encephalopathy, oversedation,     PT Comments    Pt very eager to mobilize this session.  He was able to complete gt training with RW with minor corrections x 200 ft.  He required 2L Garrett Munoz to maintain SPO2 greater than 90 %.   Will continue to plan for SNF placement to improve strength and function so he can return home independently.  May progress to HHPT if he continues to improve.    Follow Up Recommendations  SNF     Equipment Recommendations  Rolling walker with 5" wheels    Recommendations for Other Services       Precautions / Restrictions Precautions Precautions: Fall Precaution Comments: monitor O2 sats, 6L O2 Restrictions Weight Bearing Restrictions: No    Mobility  Bed Mobility Overal bed mobility: Needs Assistance Bed Mobility: Supine to Sit;Sit to Supine     Supine to sit: Supervision Sit to supine: Supervision   General bed mobility comments: No assistance supervision for safety.  Transfers Overall transfer level: Needs assistance Equipment used: Rolling walker (2 wheeled) Transfers: Sit to/from Stand Sit to Stand: Min guard         General transfer comment: Cues for hand placement and safety during session.  Ambulation/Gait Ambulation/Gait assistance: Min guard Gait Distance (Feet): 200 Feet Assistive device: Rolling walker (2 wheeled) Gait Pattern/deviations: Step-through pattern;Decreased stride length;Wide base of support     General Gait Details: Pt utilized RW for energy conservation.  Pt tolerated session well with increased gt distance.  Pt tolerated increased gt and SPO2 post session 93% on 2L .  Cues for upper trunk  control and pacing.  Seated rest breaks x 2.   Stairs             Wheelchair Mobility    Modified Rankin (Stroke Patients Only)       Balance Overall balance assessment: Needs assistance Sitting-balance support: Feet supported Sitting balance-Leahy Scale: Good       Standing balance-Leahy Scale: Fair                              Cognition Arousal/Alertness: Awake/alert Behavior During Therapy: WFL for tasks assessed/performed Overall Cognitive Status: Within Functional Limits for tasks assessed                                        Exercises      General Comments        Pertinent Vitals/Pain Pain Assessment: No/denies pain    Home Living                      Prior Function            PT Goals (current goals can now be found in the care plan section) Acute Rehab PT Goals Potential to Achieve Goals: Good Progress towards PT goals: Progressing toward goals    Frequency    Min 3X/week      PT Plan Current plan remains appropriate    Co-evaluation  AM-PAC PT "6 Clicks" Mobility   Outcome Measure  Help needed turning from your back to your side while in a flat bed without using bedrails?: A Little Help needed moving from lying on your back to sitting on the side of a flat bed without using bedrails?: A Little Help needed moving to and from a bed to a chair (including a wheelchair)?: A Little Help needed standing up from a chair using your arms (e.g., wheelchair or bedside chair)?: A Little Help needed to walk in hospital room?: A Little Help needed climbing 3-5 steps with a railing? : A Little 6 Click Score: 18    End of Session Equipment Utilized During Treatment: Gait belt;Oxygen Activity Tolerance: Patient tolerated treatment well Patient left: in bed;with call bell/phone within reach;with bed alarm set;with nursing/sitter in room Nurse Communication: Mobility status PT Visit  Diagnosis: Unsteadiness on feet (R26.81);Difficulty in walking, not elsewhere classified (R26.2);Adult, failure to thrive (R62.7)     Time: 8864-8472 PT Time Calculation (min) (ACUTE ONLY): 23 min  Charges:  $Gait Training: 8-22 mins $Therapeutic Activity: 8-22 mins                     Erasmo Leventhal , PTA Acute Rehabilitation Services Pager [phone] Office [phone]     Lejla Moeser Eli Hose 03/11/2020, 4:36 PM

## 2020-03-11 NOTE — Assessment & Plan Note (Signed)
-  We discussed his current status at length bedside with him and his wife.  He is declining going to SNF/rehab at this time and wishes to go home with home health.  Wife also bedside for the conversation and in agreement and comfortable taking patient home.  Informed patient that he could change his mind if he wanted rehab at any point.  For now, tentative plan will be for home with home health PT at discharge

## 2020-03-11 NOTE — Progress Notes (Signed)
NAME:  Garrett CHAPPLE, MRN:  [id number], DOB:  11-Jul-1945, LOS: 2 ADMISSION DATE:  03/09/2020, CONSULTATION DATE: 03/09/2020 REFERRING MD: Dr. Roosevelt Locks, CHIEF COMPLAINT: Acute on chronic respiratory failure  Brief History     History of present illness   74 year old obese former smoker, chronically ill man with a history of marginally treated OSA, chronic atrial fibrillation on anticoagulation, hypertension, restrictive/obstructive disease based on spirometry done 06/04/2019, presumed COPD.  Presented to the emergency department 11/21 with acute worsening of bilateral lower extremity edema for 3 to 4 weeks, anasarca, slowly progressive dyspnea for 1 week.  He has had some increased cough.  Apparently has albuterol nebs available although not illness intake home medicine list.  He has been using these more frequently over the last week.  In the ED chest x-ray showed cardiomegaly, bilateral interstitial infiltrates.  He is hypoxic on examination requiring supplemental oxygen uptitrated to 5 L/min. Profound anasarca present. Wheezing reported on initial exam - not wheezing on my evaluation.  He received diuretics, was empirically started on corticosteroids, doxycycline, bronchodilators.  Through the day has had desaturations, associated confusion.  ABG showed hypercapnic and hypoxic respiratory failure with respiratory acidosis.  BiPAP was initiated.  PCCM consulted to assist.  Past Medical History   Past Medical History:  Diagnosis Date  . Atrial fibrillation ([place])   . Bronchitis    hx  . Cataract    right eye  . DDD (degenerative disc disease), lumbosacral   . Dysrhythmia    irregular heatbeat  . Eye worm    right eye  . Gout   . History of kidney stones   . Hypertension   . Left knee DJD   . Morbid obesity with BMI of 40.0-44.9, adult ([place])   . Pneumonia    hx  . Prediabetes   . Radicular syndrome of left leg   . S/P total knee replacement    right  . Sleep apnea    can't use  sleep study 10 yrs ago  . 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      Significant Hospital Events   Admitted on BiPAP 11/21  Consults:  PCCM  Procedures:  Echocardiogram 11/21 >>   Significant Diagnostic Tests:    Micro Data:  SARS CoV2 11/21 >> negative Flu a and B 11/21 >> negative  Antimicrobials:  Doxycycline 11/21 >>   Interim history/subjective:  Does not appear to have worn NIPPV overnight. Feels "great." LE edema improved from prior.  Objective   Blood pressure 122/70, pulse 86, temperature 98.6 F (37 C), temperature source Oral, resp. rate 20, height 5\' 11"  (1.803 m), weight (!) 139.8 kg, SpO2 91 %.    FiO2 (%):  [40 %] 40 %   Intake/Output Summary (Last 24 hours) at 03/11/2020 0729 Last data filed at 03/11/2020 0400 Gross per 24 hour  Intake 480 ml  Output 3200 ml  Net -2720 ml   Filed Weights   03/09/20 1816 03/10/20 0406 03/11/20 0409  Weight: (!) 144.3 kg (!) 143 kg (!) 139.8 kg    Examination: General: NAD, lying in bed HENT: on [place], dentures in place Lungs: CTAB, , no wheezing Cardiovascular: Distant, regular, no murmur heard Abdomen: Obese and protuberant, nondistended, somewhat tamponaded, positive bowel sounds Extremities: Trace edema to low shin bilaterally Neuro: Awake, answers questions, follows commands, a bit difficult to understand.    Resolved Hospital Problem list     Assessment & Plan:   Acute on chronic hypoxic and hypercapnic respiratory failure.  OSA / OHS Acute on chronic HFpEF due to A Fib, HTN Presumed COPD, without clear evidence AE Anasarca Multifactorial resp failure, principally due to acute decompensated cor pulmonale, cardiogenic pulmonary edema.  Significant contributor poorly controlled superimposed OSA/OHS, consider also possible COPD.  No evidence of wheezing or an acute exacerbation on my exam although this may have been heard earlier.  Likely component of hypertension and acute diastolic CHF. Oxygenation and breathing  improved, did not wear NIPPV overnight without ill effects on mentation..   Recs: -Agree with BiPAP, PRN and at night - must wear NIPPV at night -Diuresis as blood pressure and renal function will tolerate -Will stop Pulmicort, change scheduled BD to DuoNeb  -d/c steroids, doxy 11/22 -Eliquis for A. fib -Await echocardiogram results -He will need formal PFTs once stabilized from this acute episode. -If true obstruction then will benefit from scheduled bronchodilator therapy at home once discharged -Suspect he may have chronic hypoxemia given mild polycythemia, may be discharged on oxygen.  Will need to be formally assessed for hypoxemia once he has been diuresed and has reached target volume status - will arrange outpatient follow up, needs polysomnography when at steady state  We will sign off.    Best practice:  Per primary  Labs   CBC: Recent Labs  Lab 03/09/20 1228 03/09/20 1618 03/11/20 0554  WBC 8.8  --  12.7*  NEUTROABS 6.3  --  10.4*  HGB 14.8 16.3 14.6  HCT 47.8 48.0 44.3  MCV 101.7*  --  95.9  PLT 188  --  818    Basic Metabolic Panel: Recent Labs  Lab 03/09/20 1228 03/09/20 1618 03/10/20 0426 03/11/20 0554  NA 136 138 138 138  K 4.1 4.4 4.3 3.9  CL 98  --  93* 94*  CO2 30  --  33* 35*  GLUCOSE 141*  --  157* 124*  BUN 9  --  12 16  CREATININE 0.86  --  0.93 0.95  CALCIUM 8.6*  --  8.8* 8.9  MG  --   --   --  2.2   GFR: Estimated Creatinine Clearance: 94.6 mL/min (by C-G formula based on SCr of 0.95 mg/dL). Recent Labs  Lab 03/09/20 1228 03/11/20 0554  WBC 8.8 12.7*    Liver Function Tests: Recent Labs  Lab 03/09/20 1228  AST 21  ALT 19  ALKPHOS 57  BILITOT 0.6  PROT 6.4*  ALBUMIN 3.0*   No results for input(s): LIPASE, AMYLASE in the last 168 hours. No results for input(s): AMMONIA in the last 168 hours.  ABG    Component Value Date/Time   PHART 7.322 (L) 03/10/2020 0429   PCO2ART 70.8 (HH) 03/10/2020 0429   PO2ART 68.9 (L)  03/10/2020 0429   HCO3 35.9 (H) 03/10/2020 0429   TCO2 40 (H) 03/09/2020 1618   O2SAT 93.5 03/10/2020 0429     Coagulation Profile: No results for input(s): INR, PROTIME in the last 168 hours.  Cardiac Enzymes: No results for input(s): CKTOTAL, CKMB, CKMBINDEX, TROPONINI in the last 168 hours.  HbA1C: Hgb A1c MFr Bld  Date/Time Value Ref Range Status  03/10/2020 04:26 AM 5.8 (H) 4.8 - 5.6 % Final    Comment:    (NOTE) Pre diabetes:          5.7%-6.4%  Diabetes:              >6.4%  Glycemic control for   <7.0% adults with diabetes   07/23/2016 08:05 AM 5.4 <5.7 %  Final    Comment:      For the purpose of screening for the presence of diabetes:   <5.7%       Consistent with the absence of diabetes 5.7-6.4 %   Consistent with increased risk for diabetes (prediabetes) >=6.5 %     Consistent with diabetes   This assay result is consistent with a decreased risk of diabetes.   Currently, no consensus exists regarding use of hemoglobin A1c for diagnosis of diabetes in children.   According to American Diabetes Association (ADA) guidelines, hemoglobin A1c <7.0% represents optimal control in non-pregnant diabetic patients. Different metrics may apply to specific patient populations. Standards of Medical Care in Diabetes (ADA).       CBG: Recent Labs  Lab 03/10/20 0016 03/10/20 0405 03/10/20 1244 03/10/20 2126 03/11/20 0602  GLUCAP 178* 155* 148* 115* 117*    Review of Systems:   As per HPI  Past Medical History  He,  has a past medical history of Atrial fibrillation ([place]), Bronchitis, Cataract, DDD (degenerative disc disease), lumbosacral, Dysrhythmia, Eye worm, Gout, History of kidney stones, Hypertension, Left knee DJD, Morbid obesity with BMI of 40.0-44.9, adult ([place]), Pneumonia, Prediabetes, Radicular syndrome of left leg, S/P total knee replacement, Sleep apnea, and 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.   Surgical History    Past Surgical History:  Procedure  Laterality Date  . CARDIOVERSION N/A 03/04/2015   Procedure: CARDIOVERSION;  Surgeon: [name], MD;  Location: [facility];  Service: Cardiovascular;  Laterality: N/A;  . CHOLECYSTECTOMY    . COLONOSCOPY N/A 01/01/2016   Procedure: COLONOSCOPY;  Surgeon: [name], MD;  [facility];  Service: Endoscopy;  Laterality: N/A;  1:45  . COLONOSCOPY N/A 01/14/2017   Procedure: COLONOSCOPY;  Surgeon: [name], MD;  [facility];  Service: Endoscopy;  Laterality: N/A;  12:05  . EYE EXAMINATION UNDER ANESTHESIA W/ RETINAL CRYOTHERAPY AND RETINAL LASER  07/2011  . EYE SURGERY  02/2011   cat rt  . KNEE ARTHROSCOPY     bilateral  . LUMBAR LAMINECTOMY/DECOMPRESSION MICRODISCECTOMY N/A 08/18/2017   Procedure: Bilateral microlumbar decompression Lumbar four-Lumbar five;  Surgeon: [name], MD;  Location: [facility];  Service: Orthopedics;  Laterality: N/A;  . POLYPECTOMY  01/01/2016   Procedure: POLYPECTOMY;  Surgeon: [name], MD;  [facility];  Service: Endoscopy;;  colon  . RETINAL DETACHMENT SURGERY  03/2011   rt  . TOTAL KNEE ARTHROPLASTY  2003   rt  . TOTAL KNEE ARTHROPLASTY  02/07/2012   Procedure: TOTAL KNEE ARTHROPLASTY;  Surgeon: [name], MD;  Location: [facility];  Service: Orthopedics;  Laterality: Left;     Social History   reports that he quit smoking about 28 years ago. His smoking use included cigarettes. He has a 30.00 pack-year smoking history. He has never used smokeless tobacco. He reports current alcohol use. He reports that he does not use drugs.   Family History   His family history includes Lung cancer in his father and sister.   Allergies No Known Allergies   Home Medications  Prior to Admission medications   Medication Sig Start Date End Date Taking? Authorizing Provider  acetaminophen (TYLENOL) 325 MG tablet Take 2 tablets (650 mg total) by mouth every 6 (six) hours as needed (or Fever >/= 101). 02/08/12  Yes  Shepperson, Kirstin, PA-C  allopurinol (ZYLOPRIM) 300 MG tablet TAKE 1 TABLET (300 MG TOTAL) BY MOUTH DAILY. 12/31/19  Yes Pickard,  Cammie Mcgee, MD  apixaban (ELIQUIS) 5 MG TABS tablet Take 1 tablet (5 mg total) by mouth 2 (two) times daily. 07/03/19  Yes [name], MD  carvedilol (COREG) 12.5 MG tablet TAKE 1 & 1/2 TABLETS BY MOUTH EVERY MORNING AND 1 TABLET IN THE EVENING Patient taking differently: Take 12.5-18.75 mg by mouth See admin instructions. TAKE 18.75mg  BY MOUTH EVERY MORNING AND 12.5 IN THE EVENING 04/03/19  Yes [name], MD  fluticasone (FLONASE) 50 MCG/ACT nasal spray SHAKE LIQUID AND USE 2 SPRAYS IN EACH NOSTRIL DAILY Patient taking differently: Place 2 sprays into both nostrils daily as needed for allergies or rhinitis.  04/23/19  Yes [name], MD  gabapentin (NEURONTIN) 300 MG capsule Take 600 mg by mouth at bedtime. 03/06/20  Yes [provider]  HYDROcodone-acetaminophen (NORCO) 10-325 MG tablet Take 1 tablet by mouth 3 (three) times daily as needed for pain. 03/06/20  Yes [provider]  temazepam (RESTORIL) 30 MG capsule TAKE 1 CAPSULE BY MOUTH AT BEDTIME AS NEEDED FOR SLEEP. Patient taking differently: Take 30 mg by mouth at bedtime.  12/31/19  Yes [name], MD  ELIQUIS 5 MG TABS tablet TAKE 1 TABLET BY MOUTH TWICE A DAY Patient not taking: Reported on 03/09/2020 07/06/19   [name], MD  hydrochlorothiazide (HYDRODIURIL) 25 MG tablet Take 0.5 tablets (12.5 mg total) by mouth daily. Patient not taking: Reported on 03/09/2020 03/02/19   [name], MD     Critical care time: n/a

## 2020-03-11 NOTE — Progress Notes (Signed)
Pt refused BiPAP for the night.

## 2020-03-11 NOTE — Progress Notes (Signed)
PROGRESS NOTE    Garrett Munoz   QVZ:[id number]  DOB: 19-Jan-1946  DOA: 03/09/2020     2  PCP: [name], MD  CC: SOB, swelling  Hospital Course: Garrett Munoz is a 74 yo male with PMH chronic A. fib on Eliquis, multiple joints OA, chronic bronchitis, morbid obesity, hypertension, gout who presented with SOB and swelling.  Patient has chronic A. fib has been following with cardiologist Dr. Claiborne Billings, most recent visit was January this year.  No history of CHF documented.  And according to patient's wife, patient's A. fib has been fairly controlled.  Recently patient has had worsening of back pain for which he has been following with pain management doctor.  No new medication recent months.   Patient started to have increasing swelling on both legs 3 to 4 weeks ago, along with increasing shortness of breath initially was exertional and became worse this week.  Wife also noticed patient has had increasing cough and wheezing since last week has been using more albuterol nebulizer, patient also has history of OSA not been compliant with CPAP.  Denies any chest pain, no fever chills, no chest pain no urinary symptoms no diarrhea.  Wife also noticed that the patient has been having " fingers nails and toes discoloration recently"  Chest x-ray showed cardiomegaly and pulmonary edema, patient was found to be hypoxic.  Patient became hypoxic in the ED, very agitated remove oxygen and O2 sat ration dropped to upper 50s and then became lethargic. He was eventually placed on BiPAP. He had hypercarbic respiratory failure on blood gas monitoring which improved with ongoing BiPAP therapy. He was also started on diuresis with Lasix given his volume overload. An echo was also ordered. Pulmonology was consulted on admission as well. He will need outpatient formal PFTs.   Interval History:  Wife bedside again this morning.  Patient breathing comfortable.  He did refuse to wear BiPAP overnight.  He states he  does not like the mask but when asked if he ultimately needed to wear it would he, he did state yes to this.  Oxygen being weaned some further this morning. He is declining going to rehab and wishes to go home with home health PT.  Wife bedside for this conversation and is comfortable taking him home as well.  We discussed tentative discharge on Wednesday.  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 Needs to be adequately diuresed then obtain outpatient PFTs -Required continuous BiPAP on admission which has now been weaned off. Continue nightly BiPAP as well as as needed -Continue Lasix -Appreciate pulmonology evaluation -Discontinued doxycycline and steroids -Continue duo nebs for now  Acute CHF (congestive heart failure) ([place]) -Patient had clinical signs of volume overload on admission. BNP elevated, 261 -Echo also obtained on admission: EF 60 to 65%, severe LVH. Diastolic dysfunction could not be evaluated. RV systolic function mildly reduced. No RVH. - continue lasix - strict I&O  Acute metabolic encephalopathy-resolved as of 03/10/2020 -Likely due to hypercarbia on admission. This has improved -Continue monitoring for any further mentation changes  Physical deconditioning -We discussed his current status at length bedside with him and his wife.  He is declining going  to SNF/rehab at this time and wishes to go home with home health.  Wife also bedside for the conversation and in agreement and comfortable taking patient home.  Informed patient that he could change his mind if he wanted rehab at any point.  For  now, tentative plan will be for home with home health PT at discharge  Osteoarthritis - continue current pain regimen; caution with oversedation   Prediabetes -A1c 5.8% -Continue diet control  Atrial fibrillation (HCC) -Continue Coreg and Eliquis  Gout -Uric acid level 4.3. Continue to avoid HCTZ   Antimicrobials: Doxycycline 11/21>>11/22  DVT prophylaxis: Eliquis Code Status: FULL Family Communication: wife bedside Disposition Plan: Status is: Inpatient  Remains inpatient appropriate because:Ongoing diagnostic testing needed not appropriate for outpatient work up, IV treatments appropriate due to intensity of illness or inability to take PO and Inpatient level of care appropriate due to severity of illness   Dispo: The patient is from: Home              Anticipated d/c is to: Home              Anticipated d/c date is: 1 day              Patient currently is not medically stable to d/c.  Objective: Blood pressure 121/62, pulse 86, temperature 98 F (36.7 C), temperature source Oral, resp. rate 20, height 5\' 11"  (1.803 m), weight (!) 139.8 kg, SpO2 96 %.  Examination: General appearance: alert, cooperative and no distress Head: Normocephalic, without obvious abnormality, atraumatic Eyes: EOMI Lungs: distant but mostly clear breath sounds, no wheezing Heart: regular rate and rhythm and S1, S2 normal Abdomen: normal findings: bowel sounds normal Extremities: 1-2 LE edema Skin: mobility and turgor normal Neurologic: Grossly normal  Consultants:   Pulmonology  Procedures:   none  Data Reviewed: I have personally reviewed following labs and imaging studies Results for orders placed or performed during the hospital encounter of 03/09/20 (from the past 24 hour(s))  Glucose, capillary     Status: Abnormal   Collection Time: 03/10/20  9:26 PM  Result Value Ref Range   Glucose-Capillary 115 (H) 70 - 99 mg/dL   Comment 1 Notify RN    Comment 2 Document in Chart    Basic metabolic panel     Status: Abnormal   Collection Time: 03/11/20  5:54 AM  Result Value Ref Range   Sodium 138 135 - 145 mmol/L   Potassium 3.9 3.5 - 5.1 mmol/L   Chloride 94 (L) 98 - 111 mmol/L   CO2 35 (H) 22 - 32 mmol/L   Glucose, Bld 124 (H) 70 - 99 mg/dL   BUN 16 8 - 23 mg/dL   Creatinine, Ser 0.95 0.61 - 1.24 mg/dL   Calcium 8.9 8.9 - 10.3 mg/dL   GFR, Estimated >60 >60 mL/min   Anion gap 9 5 - 15  CBC with Differential/Platelet     Status: Abnormal   Collection Time: 03/11/20  5:54 AM  Result Value Ref Range   WBC 12.7 (H) 4.0 - 10.5 K/uL   RBC 4.62 4.22 - 5.81 MIL/uL   Hemoglobin 14.6 13.0 - 17.0 g/dL   HCT 44.3 39 - 52 %   MCV 95.9 80.0 - 100.0 fL   MCH 31.6 26.0 - 34.0 pg   MCHC 33.0 30.0 - 36.0 g/dL   RDW 13.2 11.5 - 15.5 %   Platelets 200 150 - 400 K/uL   nRBC 0.0 0.0 - 0.2 %  Neutrophils Relative % 82 %   Neutro Abs 10.4 (H) 1.7 - 7.7 K/uL   Lymphocytes Relative 11 %   Lymphs Abs 1.3 0.7 - 4.0 K/uL   Monocytes Relative 7 %   Monocytes Absolute 0.9 0.1 - 1.0 K/uL   Eosinophils Relative 0 %   Eosinophils Absolute 0.0 0.0 - 0.5 K/uL   Basophils Relative 0 %   Basophils Absolute 0.0 0.0 - 0.1 K/uL   Immature Granulocytes 0 %   Abs Immature Granulocytes 0.04 0.00 - 0.07 K/uL  Magnesium     Status: None   Collection Time: 03/11/20  5:54 AM  Result Value Ref Range   Magnesium 2.2 1.7 - 2.4 mg/dL  Glucose, capillary     Status: Abnormal   Collection Time: 03/11/20  6:02 AM  Result Value Ref Range   Glucose-Capillary 117 (H) 70 - 99 mg/dL   Comment 1 Notify RN    Comment 2 Document in Chart   Glucose, capillary     Status: Abnormal   Collection Time: 03/11/20 12:03 PM  Result Value Ref Range   Glucose-Capillary 115 (H) 70 - 99 mg/dL    Recent Results (from the past 240 hour(s))  Respiratory Panel by RT PCR (Flu A&B, Covid) - Nasopharyngeal Swab     Status: None   Collection Time: 03/09/20  3:39 PM   Specimen: Nasopharyngeal Swab; Nasopharyngeal(NP)  swabs in vial transport medium  Result Value Ref Range Status   SARS Coronavirus 2 by RT PCR NEGATIVE NEGATIVE Final    Comment: (NOTE) SARS-CoV-2 target nucleic acids are NOT DETECTED.  The SARS-CoV-2 RNA is generally detectable in upper respiratoy specimens during the acute phase of infection. The lowest concentration of SARS-CoV-2 viral copies this assay can detect is 131 copies/mL. A negative result does not preclude SARS-Cov-2 infection and should not be used as the sole basis for treatment or other patient management decisions. A negative result may occur with  improper specimen collection/handling, submission of specimen other than nasopharyngeal swab, presence of viral mutation(s) within the areas targeted by this assay, and inadequate number of viral copies (<131 copies/mL). A negative result must be combined with clinical observations, patient history, and epidemiological information. The expected result is Negative.  Fact Sheet for Patients:  PinkCheek.be  Fact Sheet for Healthcare Providers:  GravelBags.it  This test is no t yet approved or cleared by the [place] FDA and  has been authorized for detection and/or diagnosis of SARS-CoV-2 by FDA under an Emergency Use Authorization (EUA). This EUA will remain  in effect (meaning this test can be used) for the duration of the COVID-19 declaration under Section 564(b)(1) of the Act, 21 U.S.C. section 360bbb-3(b)(1), unless the authorization is terminated or revoked sooner.     Influenza A by PCR NEGATIVE NEGATIVE Final   Influenza B by PCR NEGATIVE NEGATIVE Final    Comment: (NOTE) The Xpert Xpress SARS-CoV-2/FLU/RSV assay is intended as an aid in  the diagnosis of influenza from Nasopharyngeal swab specimens and  should not be used as a sole basis for treatment. Nasal washings and  aspirates are unacceptable for Xpert Xpress SARS-CoV-2/FLU/RSV   testing.  Fact Sheet for Patients: PinkCheek.be  Fact Sheet for Healthcare Providers: GravelBags.it  This test is not yet approved or cleared by the [place] FDA and  has been authorized for detection and/or diagnosis of SARS-CoV-2 by  FDA under an Emergency Use Authorization (EUA). This EUA will remain  in effect (meaning this test can be  used) for the duration of the  Covid-19 declaration under Section 564(b)(1) of the Act, 21  U.S.C. section 360bbb-3(b)(1), unless the authorization is  terminated or revoked. Performed at [facility], [place] [address], [place], [place] [address]      Radiology Studies: CT HEAD WO CONTRAST  Result Date: 03/09/2020 CLINICAL DATA:  Mental status change. EXAM: CT HEAD WITHOUT CONTRAST TECHNIQUE: Contiguous axial images were obtained from the base of the skull through the vertex without intravenous contrast. COMPARISON:  None. FINDINGS: Brain: No subdural, epidural, or subarachnoid hemorrhage. Cerebellum, brainstem, and basal cisterns are normal. Ventricles and sulci are unremarkable. Scattered white matter changes are identified. No acute cortical ischemia or infarct identified. No mass effect or midline shift. Vascular: No hyperdense vessel or unexpected calcification. Skull: Normal. Negative for fracture or focal lesion. Sinuses/Orbits: No acute finding. Other: None. IMPRESSION: Chronic white matter changes.  No acute intracranial abnormalities. Electronically Signed   By: Dorise Bullion III M.D   On: 03/09/2020 17:10   DG Chest Port 1 View  Result Date: 03/10/2020 CLINICAL DATA:  Congestive heart failure EXAM: PORTABLE CHEST 1 VIEW COMPARISON:  03/09/2020 FINDINGS: Shallow inspiration. Mild cardiac enlargement. Diffuse interstitial opacities suggesting edema. No pleural effusions. No pneumothorax. IMPRESSION: Cardiac enlargement with diffuse interstitial edema. Electronically Signed    By: Lucienne Capers M.D.   On: 03/10/2020 06:01   ECHOCARDIOGRAM COMPLETE  Result Date: 03/10/2020    ECHOCARDIOGRAM REPORT   Patient Name:   Garrett Munoz Date of Exam: 03/10/2020 Medical Rec #:  [id number]       Height:       71.0 in Accession #:    [id number]      Weight:       315.3 lb Date of Birth:  05/06/1945      BSA:          2.558 m Patient Age:    74 years        BP:           126/89 mmHg Patient Gender: M               HR:           91 bpm. Exam Location:  Inpatient Procedure: 2D Echo, Cardiac Doppler, Color Doppler and Intracardiac            Opacification Agent Indications:    CHF-Acute Systolic  History:        Patient has prior history of Echocardiogram examinations, most                 recent 02/18/2015. Arrythmias:Atrial Fibrillation,                 Signs/Symptoms:Dyspnea; Risk Factors:Former Smoker, Sleep Apnea                 and Hypertension.  Sonographer:    Clayton Lefort RDCS (AE) Referring Phys: [id number] Lequita Halt  Sonographer Comments: Technically challenging study due to limited acoustic windows, Technically difficult study due to poor echo windows, suboptimal parasternal window, suboptimal apical window, no subcostal window and patient is morbidly obese. Image acquisition challenging due to patient body habitus. IMPRESSIONS  1. Left ventricular ejection fraction, by estimation, is 60 to 65%. The left ventricle has normal function. The left ventricle has no regional wall motion abnormalities. There is severe left ventricular hypertrophy. Left ventricular diastolic function could not be evaluated.  2. Right ventricular systolic function is mildly reduced. The right ventricular  size is normal.  3. Left atrial size was mild to moderately dilated.  4. The mitral valve is grossly normal. Trivial mitral valve regurgitation.  5. The aortic valve is tricuspid. Aortic valve regurgitation is not visualized.  6. Aortic dilatation noted. 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 There is mild dilatation of the ascending aorta, measuring 40 mm. Comparison(s): Prior images unable to be directly viewed, comparison made by report only. Changes from prior study are noted. 02/18/2015: LVEF 55-60%, moderate LVH, moderate LAE, dilated aorta to 40 mm. FINDINGS  Left Ventricle: Left ventricular ejection fraction, by estimation, is 60 to 65%. The left ventricle has normal function. The left ventricle has no regional wall motion abnormalities. Definity contrast agent was given IV to delineate the left ventricular  endocardial borders. The left ventricular internal cavity size was normal in size. There is severe left ventricular hypertrophy. Left ventricular diastolic function could not be evaluated due to atrial fibrillation. Left ventricular diastolic function could not be evaluated. Right Ventricle: The right ventricular size is normal. No increase in right ventricular wall thickness. Right ventricular systolic function is mildly reduced. Left Atrium: Left atrial size was mild to moderately dilated. Right Atrium: Right atrial size was normal in size. Pericardium: There is no evidence of pericardial effusion. Mitral Valve: The mitral valve is grossly normal. Trivial mitral valve regurgitation. Tricuspid Valve: The tricuspid valve is grossly normal. Tricuspid valve regurgitation is trivial. Aortic Valve: The aortic valve is tricuspid. Aortic valve regurgitation is not visualized. Aortic valve mean gradient measures 2.0 mmHg. Aortic valve peak gradient measures 3.4 mmHg. Aortic valve area, by VTI measures 3.88 cm. Pulmonic Valve: The pulmonic valve was not well visualized. Pulmonic valve regurgitation is not visualized. Aorta: Aortic dilatation noted. 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 There is mild dilatation of the ascending aorta, measuring 40 mm. Venous: The inferior vena cava was not well visualized. IAS/Shunts: No atrial level shunt detected  by color flow Doppler.  LEFT VENTRICLE PLAX 2D LVIDd:         4.20 cm LVIDs:         2.80 cm LV PW:         1.70 cm LV IVS:        1.80 cm LVOT diam:     2.30 cm LV SV:         64 LV SV Index:   25 LVOT Area:     4.15 cm  RIGHT VENTRICLE RV Basal diam:  3.50 cm RV Mid diam:    2.70 cm RV S prime:     8.92 cm/s LEFT ATRIUM             Index       RIGHT ATRIUM           Index LA diam:        3.50 cm 1.37 cm/m  RA Area:     21.20 cm LA Vol (A2C):   72.8 ml 28.45 ml/m RA Volume:   53.10 ml  20.75 ml/m LA Vol (A4C):   95.5 ml 37.33 ml/m LA Biplane Vol: 86.4 ml 33.77 ml/m  AORTIC VALVE AV Area (Vmax):    3.25 cm AV Area (Vmean):   3.06 cm AV Area (VTI):     3.88 cm AV Vmax:           92.70 cm/s AV Vmean:  68.600 cm/s AV VTI:            0.166 m AV Peak Grad:      3.4 mmHg AV Mean Grad:      2.0 mmHg LVOT Vmax:         72.50 cm/s LVOT Vmean:        50.500 cm/s LVOT VTI:          0.155 m LVOT/AV VTI ratio: 0.93  AORTA Ao Root diam: 4.30 cm Ao Asc diam:  4.00 cm TRICUSPID VALVE TR Peak grad:   34.3 mmHg TR Vmax:        293.00 cm/s  SHUNTS Systemic VTI:  0.16 m Systemic Diam: 2.30 cm Lyman Bishop MD Electronically signed by Lyman Bishop MD Signature Date/Time: 03/10/2020/10:05:12 AM    Final    DG Chest Port 1 View  Final Result    CT HEAD WO CONTRAST  Final Result    DG Chest Port 1 View  Final Result      Scheduled Meds: . apixaban  5 mg Oral BID  . carvedilol  18.75 mg Oral Daily   And  . carvedilol  12.5 mg Oral QHS  . chlorhexidine  15 mL Mouth Rinse BID  . feeding supplement  1 Container Oral TID BM  . furosemide  40 mg Intravenous BID  . gabapentin  600 mg Oral QHS  . insulin aspart  0-9 Units Subcutaneous TID WC  . ipratropium-albuterol  3 mL Nebulization TID  . mouth rinse  15 mL Mouth Rinse q12n4p  . multivitamin with minerals  1 tablet Oral Daily  . sodium chloride flush  3 mL Intravenous Q12H   PRN Meds: sodium chloride, acetaminophen, fluticasone,  HYDROcodone-acetaminophen, ondansetron (ZOFRAN) IV, sodium chloride flush, temazepam Continuous Infusions: . sodium chloride 250 mL (03/09/20 1858)     LOS: 2 days  Time spent: Greater than 50% of the 35 minute visit was spent in counseling/coordination of care for the patient as laid out in the A&P.   Dwyane Dee, MD [facility] 03/11/2020, 3:06 PM

## 2020-03-12 ENCOUNTER — Other Ambulatory Visit (HOSPITAL_COMMUNITY): Payer: Self-pay | Admitting: Internal Medicine

## 2020-03-12 LAB — GLUCOSE, CAPILLARY
Glucose-Capillary: 118 mg/dL — ABNORMAL HIGH (ref 70–99)
Glucose-Capillary: 118 mg/dL — ABNORMAL HIGH (ref 70–99)

## 2020-03-12 LAB — CBC WITH DIFFERENTIAL/PLATELET
Abs Immature Granulocytes: 0.03 10*3/uL (ref 0.00–0.07)
Basophils Absolute: 0 10*3/uL (ref 0.0–0.1)
Basophils Relative: 0 %
Eosinophils Absolute: 0.1 10*3/uL (ref 0.0–0.5)
Eosinophils Relative: 1 %
HCT: 45.4 % (ref 39.0–52.0)
Hemoglobin: 15.1 g/dL (ref 13.0–17.0)
Immature Granulocytes: 0 %
Lymphocytes Relative: 18 %
Lymphs Abs: 1.7 10*3/uL (ref 0.7–4.0)
MCH: 31.7 pg (ref 26.0–34.0)
MCHC: 33.3 g/dL (ref 30.0–36.0)
MCV: 95.2 fL (ref 80.0–100.0)
Monocytes Absolute: 1 10*3/uL (ref 0.1–1.0)
Monocytes Relative: 10 %
Neutro Abs: 6.8 10*3/uL (ref 1.7–7.7)
Neutrophils Relative %: 71 %
Platelets: 217 10*3/uL (ref 150–400)
RBC: 4.77 MIL/uL (ref 4.22–5.81)
RDW: 13.4 % (ref 11.5–15.5)
WBC: 9.7 10*3/uL (ref 4.0–10.5)
nRBC: 0 % (ref 0.0–0.2)

## 2020-03-12 LAB — BASIC METABOLIC PANEL
Anion gap: 11 (ref 5–15)
BUN: 14 mg/dL (ref 8–23)
CO2: 33 mmol/L — ABNORMAL HIGH (ref 22–32)
Calcium: 8.9 mg/dL (ref 8.9–10.3)
Chloride: 95 mmol/L — ABNORMAL LOW (ref 98–111)
Creatinine, Ser: 0.79 mg/dL (ref 0.61–1.24)
GFR, Estimated: >60 mL/min (ref >60)
Glucose, Bld: 108 mg/dL — ABNORMAL HIGH (ref 70–99)
Potassium: 3.5 mmol/L (ref 3.5–5.1)
Sodium: 139 mmol/L (ref 135–145)

## 2020-03-12 LAB — MAGNESIUM: Magnesium: 2 mg/dL (ref 1.7–2.4)

## 2020-03-12 MED ORDER — FUROSEMIDE 80 MG PO TABS: 80.0000 mg | ORAL_TABLET | Freq: Every day | ORAL | 3 refills | Status: DC

## 2020-03-12 MED ORDER — MULTI-VITAMIN/MINERALS PO TABS: 1.0000 | ORAL_TABLET | Freq: Every day | ORAL | 2 refills | Status: DC

## 2020-03-12 MED FILL — FUROSEMIDE 80 MG TAB: 80 | 30 days supply | Qty: 30 | Fill #0

## 2020-03-12 NOTE — Plan of Care (Signed)
°  Problem: Education: Goal: Ability to demonstrate management of disease process will improve Outcome: Adequate for Discharge Goal: Ability to verbalize understanding of medication therapies will improve Outcome: Adequate for Discharge Goal: Individualized Educational Video(s) Outcome: Adequate for Discharge   Problem: Activity: Goal: Capacity to carry out activities will improve Outcome: Adequate for Discharge   Problem: Cardiac: Goal: Ability to achieve and maintain adequate cardiopulmonary perfusion will improve Outcome: Adequate for Discharge   Problem: Education: Goal: Knowledge of General Education information will improve Description: Including pain rating scale, medication(s)/side effects and non-pharmacologic comfort measures Outcome: Adequate for Discharge   Problem: Health Behavior/Discharge Planning: Goal: Ability to manage health-related needs will improve Outcome: Adequate for Discharge   Problem: Clinical Measurements: Goal: Ability to maintain clinical measurements within normal limits will improve Outcome: Adequate for Discharge Goal: Will remain free from infection Outcome: Adequate for Discharge Goal: Diagnostic test results will improve Outcome: Adequate for Discharge Goal: Respiratory complications will improve Outcome: Adequate for Discharge Goal: Cardiovascular complication will be avoided Outcome: Adequate for Discharge   Problem: Activity: Goal: Risk for activity intolerance will decrease Outcome: Adequate for Discharge   Problem: Nutrition: Goal: Adequate nutrition will be maintained Outcome: Adequate for Discharge   Problem: Coping: Goal: Level of anxiety will decrease Outcome: Adequate for Discharge   Problem: Elimination: Goal: Will not experience complications related to bowel motility Outcome: Adequate for Discharge Goal: Will not experience complications related to urinary retention Outcome: Adequate for Discharge   Problem: Pain  Managment: Goal: General experience of comfort will improve Outcome: Adequate for Discharge   Problem: Safety: Goal: Ability to remain free from injury will improve Outcome: Adequate for Discharge   Problem: Skin Integrity: Goal: Risk for impaired skin integrity will decrease Outcome: Adequate for Discharge   Problem: Education: Goal: Knowledge of disease or condition will improve Outcome: Adequate for Discharge Goal: Understanding of medication regimen will improve Outcome: Adequate for Discharge Goal: Individualized Educational Video(s) Outcome: Adequate for Discharge   Problem: Activity: Goal: Ability to tolerate increased activity will improve Outcome: Adequate for Discharge   Problem: Cardiac: Goal: Ability to achieve and maintain adequate cardiopulmonary perfusion will improve Outcome: Adequate for Discharge   Problem: Health Behavior/Discharge Planning: Goal: Ability to safely manage health-related needs after discharge will improve Outcome: Adequate for Discharge   

## 2020-03-12 NOTE — TOC Transition Note (Signed)
Transition of Care [facility]) - CM/SW Discharge Note   Patient Details  Name: Garrett Munoz MRN: [id number] Date of Birth: 10/21/1945  Transition of Care [facility]) CM/SW Contact:  Zenon Mayo, RN Phone Number: 03/12/2020, 12:51 PM   Clinical Narrative:    Patient is for dc today, NCM offered choice to wife at bedside, she states she does not have a preference, NCM made referral to [facility] with [facility] for [place].  He states he can take referral.  SOC will start next week.  Thedore Mins also will is speaking to patient regarding the NIIV, which will be delivered to the house.    Final next level of care: Home w Home Health Services Barriers to Discharge: No Barriers Identified   Patient Goals and CMS Choice   CMS Medicare.gov Compare Post Acute Care list provided to:: Patient Represenative (must comment) Choice offered to / list presented to : Spouse  Discharge Placement                       Discharge Plan and Services                DME Arranged: NIV DME Agency: [employer] Date DME Agency Contacted: 03/12/20 Time DME Agency Contacted: 1250 Representative spoke with at DME Agency: [place]: PT [place]: [place] Date [place]: 03/12/20 Time [place]: 1250 Representative spoke with at [place]: [place] ([place]) Interventions     Readmission Risk Interventions No flowsheet data found.

## 2020-03-12 NOTE — Progress Notes (Addendum)
Pt confused and was found near the bathroom by the NT. Pt attempting to leave his room. Tele monitor and oxygen had been off for approximately 10 minutes. Large amount of urine/water mix found near the pt's bed. Central telemetry called this RN after the pt was returned to the bed.

## 2020-03-12 NOTE — Progress Notes (Signed)

## 2020-03-12 NOTE — Progress Notes (Deleted)
Pt confused and was found near the bathroom

## 2020-03-12 NOTE — Progress Notes (Addendum)
O2 sat temporarily dropped to 64% after pt removed nasal cannula. CCMD called this RN while in the room. O2 sat increased to 92% after 1 L nasal cannula placed back on patient.

## 2020-03-12 NOTE — TOC Transition Note (Signed)
Transition of Care [facility]) - CM/SW Discharge Note   Patient Details  Name: Garrett Munoz MRN: [id number] Date of Birth: 01-07-1946  [facility]) CM/SW Contact:  Zenon Mayo, RN Phone Number: 03/12/2020, 12:23 PM   Clinical Narrative:    Patient is for dc, Patient will need a bipap.  He will qualify for NIV.  MD will sign order and narrative.     Final next level of care: Home/Self Care Barriers to Discharge: No Barriers Identified   Patient Goals and CMS Choice        Discharge Placement                       Discharge Plan and Services                DME Arranged: NIV DME Agency: [employer] Date DME Agency Contacted: 03/12/20 Time DME Agency Contacted: 1223 Representative spoke with at DME Agency: [place]: NA          Social Determinants of Health ([place]) Interventions     Readmission Risk Interventions No flowsheet data found.

## 2020-03-12 NOTE — Progress Notes (Signed)
  Mobility Specialist Criteria Algorithm Info.  SATURATION QUALIFICATIONS: (This note is used to comply with regulatory documentation for home oxygen)  Patient Saturations on Room Air at Rest = 98%  Patient Saturations on Room Air while Ambulating = 95%  Patient Saturations on 0 Liters of oxygen while Ambulating = n/a%  Please briefly explain why patient needs home oxygen:  Mobility Team:  HOB elevated: Activity: Ambulated in hall Range of motion: Active;All extremities Level of assistance: Independent after set-up Assistive device: Front wheel walker Minutes sitting in chair:  Minutes stood: 5 minutes Minutes ambulated: 5 minutes Distance ambulated (ft): 300 ft Mobility response: Tolerated well Bed Position: Chair    03/12/2020 2:23 PM

## 2020-03-12 NOTE — Progress Notes (Signed)
D/C instructions given and reviewed. No questions asked but encouraged to call with any concerns. Tele and IV removed, tolerated well. 

## 2020-03-12 NOTE — Care Management Important Message (Signed)
Important Message  Patient Details  Name: MACKIE HOLNESS MRN: [id number] Date of Birth: 1945/12/14   Medicare Important Message Given:  Yes     Shelda Altes 03/12/2020, 10:01 AM

## 2020-03-12 NOTE — Plan of Care (Signed)
  Problem: Education: Goal: Ability to demonstrate management of disease process will improve Outcome: Adequate for Discharge Goal: Ability to verbalize understanding of medication therapies will improve Outcome: Adequate for Discharge Goal: Individualized Educational Video(s) Outcome: Adequate for Discharge   Problem: Activity: Goal: Capacity to carry out activities will improve Outcome: Adequate for Discharge   Problem: Cardiac: Goal: Ability to achieve and maintain adequate cardiopulmonary perfusion will improve Outcome: Adequate for Discharge   Problem: Education: Goal: Knowledge of General Education information will improve Description: Including pain rating scale, medication(s)/side effects and non-pharmacologic comfort measures Outcome: Adequate for Discharge   Problem: Health Behavior/Discharge Planning: Goal: Ability to manage health-related needs will improve Outcome: Adequate for Discharge   Problem: Clinical Measurements: Goal: Ability to maintain clinical measurements within normal limits will improve Outcome: Adequate for Discharge Goal: Will remain free from infection Outcome: Adequate for Discharge Goal: Diagnostic test results will improve Outcome: Adequate for Discharge Goal: Respiratory complications will improve Outcome: Adequate for Discharge Goal: Cardiovascular complication will be avoided Outcome: Adequate for Discharge   Problem: Activity: Goal: Risk for activity intolerance will decrease Outcome: Adequate for Discharge   Problem: Nutrition: Goal: Adequate nutrition will be maintained Outcome: Adequate for Discharge   Problem: Coping: Goal: Level of anxiety will decrease Outcome: Adequate for Discharge   Problem: Elimination: Goal: Will not experience complications related to bowel motility Outcome: Adequate for Discharge Goal: Will not experience complications related to urinary retention Outcome: Adequate for Discharge   Problem: Pain  Managment: Goal: General experience of comfort will improve Outcome: Adequate for Discharge   Problem: Safety: Goal: Ability to remain free from injury will improve Outcome: Adequate for Discharge   Problem: Skin Integrity: Goal: Risk for impaired skin integrity will decrease Outcome: Adequate for Discharge   Problem: Education: Goal: Knowledge of disease or condition will improve Outcome: Adequate for Discharge Goal: Understanding of medication regimen will improve Outcome: Adequate for Discharge Goal: Individualized Educational Video(s) Outcome: Adequate for Discharge   Problem: Activity: Goal: Ability to tolerate increased activity will improve Outcome: Adequate for Discharge   Problem: Cardiac: Goal: Ability to achieve and maintain adequate cardiopulmonary perfusion will improve Outcome: Adequate for Discharge   Problem: Health Behavior/Discharge Planning: Goal: Ability to safely manage health-related needs after discharge will improve Outcome: Adequate for Discharge   

## 2020-03-13 NOTE — Discharge Summary (Signed)
Physician Discharge Summary   Garrett Munoz IRS:[id number] DOB: 25-Dec-1945 DOA: 03/09/2020  PCP: [name], MD  Admit date: 03/09/2020 Discharge date: 03/13/2020  Admitted From: home Disposition:  home Discharging physician: Dwyane Dee, MD  Recommendations for Outpatient Follow-up:  1. Outpt sleep study 2. Repeat BMP given d/c with lasix   Patient discharged to home in Discharge Condition: stable CODE STATUS: Full Diet recommendation:  Diet Orders (From admission, onward)    Start     Ordered   03/12/20 0000  Diet - low sodium heart healthy        03/12/20 1137          Hospital Course: Mr. Alabi is a 74 yo male with PMH chronic A. fib on Eliquis, multiple joints OA, chronic bronchitis, morbid obesity, hypertension, gout who presented with SOB and swelling.  Patient has chronic A. fib has been following with cardiologist Dr. Claiborne Billings, most recent visit was January this year.  No history of CHF documented.  And according to patient's wife, patient's A. fib has been fairly controlled.  Recently patient has had worsening of back pain for which he has been following with pain management doctor.  No new medication recent months.   Patient started to have increasing swelling on both legs 3 to 4 weeks ago, along with increasing shortness of breath initially was exertional and became worse this week.  Wife also noticed patient has had increasing cough and wheezing since last week has been using more albuterol nebulizer, patient also has history of OSA not been compliant with CPAP.  Denies any chest pain, no fever chills, no chest pain no urinary symptoms no diarrhea.  Wife also noticed that the patient has been having " fingers nails and toes discoloration recently"  Chest x-ray showed cardiomegaly and pulmonary edema, patient was found to be hypoxic.  Patient became hypoxic in the ED, very agitated remove oxygen and O2 sat ration dropped to upper 50s and then became  lethargic. He was eventually placed on BiPAP. He had hypercarbic respiratory failure on blood gas monitoring which improved with ongoing BiPAP therapy. He was also started on diuresis with Lasix given his volume overload. An echo was also ordered. EF 60-65%, Severe LVH.  Mildly reduced right ventricular systolic function. Pulmonology was consulted on admission as well. He will need outpatient formal PFTs. Prior to discharge he was able to be weaned off of oxygen to room air and ambulated well in the hallway with no desaturation events.  He was also arranged for home health as well as NIV but has a scheduled sleep study at time of discharge as well.   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 Needs to be adequately diuresed then obtain outpatient PFTs -Required continuous BiPAP on admission which has now been weaned off. Continue nightly BiPAP as well as as needed -Continue Lasix -Appreciate pulmonology evaluation -Discontinued doxycycline and steroids -Continue duo nebs for now  Acute CHF (congestive heart failure) ([place]) -Patient had clinical signs of volume overload on admission. BNP elevated, 261 -Echo also obtained on admission: EF 60 to 65%, severe LVH. Diastolic dysfunction could not be evaluated. RV systolic function mildly reduced. No RVH. - continue lasix  Acute metabolic encephalopathy-resolved as of 03/10/2020 -Likely due to hypercarbia on admission. This has improved -Continue monitoring for any further mentation changes  Physical deconditioning -We discussed his current status at length  bedside with him and his wife.  He is declining going to SNF/rehab at this time and wishes to go home with home health.  Wife also bedside for the conversation and in agreement and comfortable taking patient home.  Informed patient that he could change his mind if  he wanted rehab at any point.  For now, tentative plan will be for home with home health PT at discharge  Osteoarthritis - continue current pain regimen; caution with oversedation   Prediabetes -A1c 5.8% -Continue diet control  Atrial fibrillation (HCC) -Continue Coreg and Eliquis  Gout -Uric acid level 4.3. Continue to avoid HCTZ    The patient's chronic medical conditions were treated accordingly per the patient's home medication regimen except as noted.  On day of discharge, patient was felt deemed stable for discharge. Patient/family member advised to call PCP or come back to ER if needed.   Principal Diagnosis: Acute respiratory failure with hypoxia and hypercapnia [facility])  Discharge Diagnoses: Active Hospital Problems   Diagnosis Date Noted  . Acute respiratory failure with hypoxia and hypercapnia (HCC)     Priority: High  . Acute CHF (congestive heart failure) ([place]) 03/09/2020    Priority: Medium  . Physical deconditioning 03/11/2020  . Prediabetes 03/10/2020  . Osteoarthritis 03/10/2020  . Atrial fibrillation ([place]) 02/01/2015  . Gout     Resolved Hospital Problems   Diagnosis Date Noted Date Resolved  . Acute metabolic encephalopathy 24/40/1027 03/10/2020    Priority: Medium    Discharge Instructions    Diet - low sodium heart healthy   Complete by: As directed    Increase activity slowly   Complete by: As directed      Allergies as of 03/12/2020   No Known Allergies     Medication List    STOP taking these medications   acetaminophen 325 MG tablet Commonly known as: TYLENOL   hydrochlorothiazide 25 MG tablet Commonly known as: HYDRODIURIL     TAKE these medications   allopurinol 300 MG tablet Commonly known as: ZYLOPRIM TAKE 1 TABLET (300 MG TOTAL) BY MOUTH DAILY.   apixaban 5 MG Tabs tablet Commonly known as: Eliquis Take 1 tablet (5 mg total) by mouth 2 (two) times daily. What changed: Another medication with the same name was removed.  Continue taking this medication, and follow the directions you see here.   carvedilol 12.5 MG tablet Commonly known as: COREG TAKE 1 & 1/2 TABLETS BY MOUTH EVERY MORNING AND 1 TABLET IN THE EVENING What changed: See the new instructions.   fluticasone 50 MCG/ACT nasal spray Commonly known as: FLONASE SHAKE LIQUID AND USE 2 SPRAYS IN EACH NOSTRIL DAILY What changed: See the new instructions.   furosemide 80 MG tablet Commonly known as: Lasix Take 1 tablet (80 mg total) by mouth daily.   gabapentin 300 MG capsule Commonly known as: NEURONTIN Take 600 mg by mouth at bedtime.   HYDROcodone-acetaminophen 10-325 MG tablet Commonly known as: NORCO Take 1 tablet by mouth 3 (three) times daily as needed for pain.   multivitamin with minerals tablet Take 1 tablet by mouth daily.   temazepam 30 MG capsule Commonly known as: RESTORIL TAKE 1 CAPSULE BY MOUTH AT BEDTIME AS NEEDED FOR SLEEP. What changed: See the new instructions.       Follow-up Information    [facility] Follow up.   Why: NIV Contact information: [place] [address] [phone]        [facility], [facility]  Follow up.   Specialty: Home Health Services Why: HHPT Contact information: [address]  [place] [address] [phone]              No Known Allergies  Consultations: Pulm  Discharge Exam: BP 123/75 (BP Location: Left Arm)   Pulse 93   Temp (!) 97.4 F (36.3 C) (Oral)   Resp 18   Ht 5\' 11"  (1.803 m)   Wt (!) 138.1 kg Comment: scale c  SpO2 94%   BMI 42.46 kg/m  General appearance: alert, cooperative and no distress Head: Normocephalic, without obvious abnormality, atraumatic Eyes: EOMI Lungs: distant but mostly clear breath sounds, no wheezing Heart: regular rate and rhythm and S1, S2 normal Abdomen: normal findings: bowel sounds normal Extremities: 1+ LE edema Skin: mobility and turgor normal Neurologic: Grossly normal  The  results of significant diagnostics from this hospitalization (including imaging, microbiology, ancillary and laboratory) are listed below for reference.   Microbiology: Recent Results (from the past 240 hour(s))  Respiratory Panel by RT PCR (Flu A&B, Covid) - Nasopharyngeal Swab     Status: None   Collection Time: 03/09/20  3:39 PM   Specimen: Nasopharyngeal Swab; Nasopharyngeal(NP) swabs in vial transport medium  Result Value Ref Range Status   SARS Coronavirus 2 by RT PCR NEGATIVE NEGATIVE Final    Comment: (NOTE) SARS-CoV-2 target nucleic acids are NOT DETECTED.  The SARS-CoV-2 RNA is generally detectable in upper respiratoy specimens during the acute phase of infection. The lowest concentration of SARS-CoV-2 viral copies this assay can detect is 131 copies/mL. A negative result does not preclude SARS-Cov-2 infection and should not be used as the sole basis for treatment or other patient management decisions. A negative result may occur with  improper specimen collection/handling, submission of specimen other than nasopharyngeal swab, presence of viral mutation(s) within the areas targeted by this assay, and inadequate number of viral copies (<131 copies/mL). A negative result must be combined with clinical observations, patient history, and epidemiological information. The expected result is Negative.  Fact Sheet for Patients:  PinkCheek.be  Fact Sheet for Healthcare Providers:  GravelBags.it  This test is no t yet approved or cleared by the [place] FDA and  has been authorized for detection and/or diagnosis of SARS-CoV-2 by FDA under an Emergency Use Authorization (EUA). This EUA will remain  in effect (meaning this test can be used) for the duration of the COVID-19 declaration under Section 564(b)(1) of the Act, 21 U.S.C. section 360bbb-3(b)(1), unless the authorization is terminated or revoked sooner.      Influenza A by PCR NEGATIVE NEGATIVE Final   Influenza B by PCR NEGATIVE NEGATIVE Final    Comment: (NOTE) The Xpert Xpress SARS-CoV-2/FLU/RSV assay is intended as an aid in  the diagnosis of influenza from Nasopharyngeal swab specimens and  should not be used as a sole basis for treatment. Nasal washings and  aspirates are unacceptable for Xpert Xpress SARS-CoV-2/FLU/RSV  testing.  Fact Sheet for Patients: PinkCheek.be  Fact Sheet for Healthcare Providers: GravelBags.it  This test is not yet approved or cleared by the [place] FDA and  has been authorized for detection and/or diagnosis of SARS-CoV-2 by  FDA under an Emergency Use Authorization (EUA). This EUA will remain  in effect (meaning this test can be used) for the duration of the  Covid-19 declaration under Section 564(b)(1) of the Act, 21  U.S.C. section 360bbb-3(b)(1), unless the authorization is  terminated or revoked. Performed at [facility],  [address], [place], [place] [address]      Labs: BNP (last 3 results) Recent Labs    06/04/19 1026 03/09/20 1228  BNP 102* 009.3*   Basic Metabolic Panel: Recent Labs  Lab 03/09/20 1228 03/09/20 1618 03/10/20 0426 03/11/20 0554 03/12/20 0328  NA 136 138 138 138 139  K 4.1 4.4 4.3 3.9 3.5  CL 98  --  93* 94* 95*  CO2 30  --  33* 35* 33*  GLUCOSE 141*  --  157* 124* 108*  BUN 9  --  12 16 14   CREATININE 0.86  --  0.93 0.95 0.79  CALCIUM 8.6*  --  8.8* 8.9 8.9  MG  --   --   --  2.2 2.0   Liver Function Tests: Recent Labs  Lab 03/09/20 1228  AST 21  ALT 19  ALKPHOS 57  BILITOT 0.6  PROT 6.4*  ALBUMIN 3.0*   No results for input(s): LIPASE, AMYLASE in the last 168 hours. No results for input(s): AMMONIA in the last 168 hours. CBC: Recent Labs  Lab 03/09/20 1228 03/09/20 1618 03/11/20 0554 03/12/20 0328  WBC 8.8  --  12.7* 9.7  NEUTROABS 6.3  --  10.4* 6.8  HGB 14.8 16.3  14.6 15.1  HCT 47.8 48.0 44.3 45.4  MCV 101.7*  --  95.9 95.2  PLT 188  --  200 217   Cardiac Enzymes: No results for input(s): CKTOTAL, CKMB, CKMBINDEX, TROPONINI in the last 168 hours. BNP: Invalid input(s): POCBNP CBG: Recent Labs  Lab 03/11/20 1203 03/11/20 1549 03/11/20 2051 03/12/20 0601 03/12/20 1111  GLUCAP 115* 101* 113* 118* 118*   D-Dimer No results for input(s): DDIMER in the last 72 hours. Hgb A1c No results for input(s): HGBA1C in the last 72 hours. Lipid Profile No results for input(s): CHOL, HDL, LDLCALC, TRIG, CHOLHDL, LDLDIRECT in the last 72 hours. Thyroid function studies No results for input(s): TSH, T4TOTAL, T3FREE, THYROIDAB in the last 72 hours.  Invalid input(s): FREET3 Anemia work up No results for input(s): VITAMINB12, FOLATE, FERRITIN, TIBC, IRON, RETICCTPCT in the last 72 hours. Urinalysis    Component Value Date/Time   COLORURINE STRAW (A) 03/09/2020 1634   APPEARANCEUR CLEAR 03/09/2020 1634   LABSPEC 1.008 03/09/2020 1634   PHURINE 5.0 03/09/2020 1634   GLUCOSEU NEGATIVE 03/09/2020 1634   HGBUR NEGATIVE 03/09/2020 [place] 03/09/2020 [place] 03/09/2020 1634   PROTEINUR NEGATIVE 03/09/2020 1634   UROBILINOGEN 0.2 02/02/2012 1120   NITRITE NEGATIVE 03/09/2020 1634   LEUKOCYTESUR TRACE (A) 03/09/2020 1634   Sepsis Labs Invalid input(s): PROCALCITONIN,  WBC,  LACTICIDVEN Microbiology Recent Results (from the past 240 hour(s))  Respiratory Panel by RT PCR (Flu A&B, Covid) - Nasopharyngeal Swab     Status: None   Collection Time: 03/09/20  3:39 PM   Specimen: Nasopharyngeal Swab; Nasopharyngeal(NP) swabs in vial transport medium  Result Value Ref Range Status   SARS Coronavirus 2 by RT PCR NEGATIVE NEGATIVE Final    Comment: (NOTE) SARS-CoV-2 target nucleic acids are NOT DETECTED.  The SARS-CoV-2 RNA is generally detectable in upper respiratoy specimens during the acute phase of infection. The  lowest concentration of SARS-CoV-2 viral copies this assay can detect is 131 copies/mL. A negative result does not preclude SARS-Cov-2 infection and should not be used as the sole basis for treatment or other patient management decisions. A negative result may occur with  improper specimen collection/handling, submission of specimen other than  nasopharyngeal swab, presence of viral mutation(s) within the areas targeted by this assay, and inadequate number of viral copies (<131 copies/mL). A negative result must be combined with clinical observations, patient history, and epidemiological information. The expected result is Negative.  Fact Sheet for Patients:  PinkCheek.be  Fact Sheet for Healthcare Providers:  GravelBags.it  This test is no t yet approved or cleared by the [place] FDA and  has been authorized for detection and/or diagnosis of SARS-CoV-2 by FDA under an Emergency Use Authorization (EUA). This EUA will remain  in effect (meaning this test can be used) for the duration of the COVID-19 declaration under Section 564(b)(1) of the Act, 21 U.S.C. section 360bbb-3(b)(1), unless the authorization is terminated or revoked sooner.     Influenza A by PCR NEGATIVE NEGATIVE Final   Influenza B by PCR NEGATIVE NEGATIVE Final    Comment: (NOTE) The Xpert Xpress SARS-CoV-2/FLU/RSV assay is intended as an aid in  the diagnosis of influenza from Nasopharyngeal swab specimens and  should not be used as a sole basis for treatment. Nasal washings and  aspirates are unacceptable for Xpert Xpress SARS-CoV-2/FLU/RSV  testing.  Fact Sheet for Patients: PinkCheek.be  Fact Sheet for Healthcare Providers: GravelBags.it  This test is not yet approved or cleared by the [place] FDA and  has been authorized for detection and/or diagnosis of SARS-CoV-2 by  FDA under  an Emergency Use Authorization (EUA). This EUA will remain  in effect (meaning this test can be used) for the duration of the  Covid-19 declaration under Section 564(b)(1) of the Act, 21  U.S.C. section 360bbb-3(b)(1), unless the authorization is  terminated or revoked. Performed at [facility], [place] [address] [place], [place] [address]     Procedures/Studies: CT HEAD WO CONTRAST  Result Date: 03/09/2020 CLINICAL DATA:  Mental status change. EXAM: CT HEAD WITHOUT CONTRAST TECHNIQUE: Contiguous axial images were obtained from the base of the skull through the vertex without intravenous contrast. COMPARISON:  None. FINDINGS: Brain: No subdural, epidural, or subarachnoid hemorrhage. Cerebellum, brainstem, and basal cisterns are normal. Ventricles and sulci are unremarkable. Scattered white matter changes are identified. No acute cortical ischemia or infarct identified. No mass effect or midline shift. Vascular: No hyperdense vessel or unexpected calcification. Skull: Normal. Negative for fracture or focal lesion. Sinuses/Orbits: No acute finding. Other: None. IMPRESSION: Chronic white matter changes.  No acute intracranial abnormalities. Electronically Signed   By: Dorise Bullion III M.D   On: 03/09/2020 17:10   DG Chest Port 1 View  Result Date: 03/10/2020 CLINICAL DATA:  Congestive heart failure EXAM: PORTABLE CHEST 1 VIEW COMPARISON:  03/09/2020 FINDINGS: Shallow inspiration. Mild cardiac enlargement. Diffuse interstitial opacities suggesting edema. No pleural effusions. No pneumothorax. IMPRESSION: Cardiac enlargement with diffuse interstitial edema. Electronically Signed   By: Lucienne Capers M.D.   On: 03/10/2020 06:01   DG Chest Port 1 View  Result Date: 03/09/2020 CLINICAL DATA:  Shortness of breath EXAM: PORTABLE CHEST 1 VIEW COMPARISON:  06/04/2019 FINDINGS: Increased interstitial prominence with ill-defined patchy density. Central pulmonary vascular congestion. No pleural  effusion or pneumothorax. Cardiomegaly. IMPRESSION: Findings suggestive of pulmonary edema.  Cardiomegaly. Electronically Signed   By: Macy Mis M.D.   On: 03/09/2020 13:07   ECHOCARDIOGRAM COMPLETE  Result Date: 03/10/2020    ECHOCARDIOGRAM REPORT   Patient Name:   AKING KLABUNDE Date of Exam: 03/10/2020 Medical Rec #:  [id number]       Height:  71.0 in Accession #:    [id number]      Weight:       315.3 lb Date of Birth:  18-Feb-1946      BSA:          2.558 m Patient Age:    65 years        BP:           126/89 mmHg Patient Gender: M               HR:           91 bpm. Exam Location:  Inpatient Procedure: 2D Echo, Cardiac Doppler, Color Doppler and Intracardiac            Opacification Agent Indications:    CHF-Acute Systolic  History:        Patient has prior history of Echocardiogram examinations, most                 recent 02/18/2015. Arrythmias:Atrial Fibrillation,                 Signs/Symptoms:Dyspnea; Risk Factors:Former Smoker, Sleep Apnea                 and Hypertension.  Sonographer:    Clayton Lefort RDCS (AE) Referring Phys: [id number] Lequita Halt  Sonographer Comments: Technically challenging study due to limited acoustic windows, Technically difficult study due to poor echo windows, suboptimal parasternal window, suboptimal apical window, no subcostal window and patient is morbidly obese. Image acquisition challenging due to patient body habitus. IMPRESSIONS  1. Left ventricular ejection fraction, by estimation, is 60 to 65%. The left ventricle has normal function. The left ventricle has no regional wall motion abnormalities. There is severe left ventricular hypertrophy. Left ventricular diastolic function could not be evaluated.  2. Right ventricular systolic function is mildly reduced. The right ventricular size is normal.  3. Left atrial size was mild to moderately dilated.  4. The mitral valve is grossly normal. Trivial mitral valve regurgitation.  5. The aortic valve is tricuspid.  Aortic valve regurgitation is not visualized.  6. Aortic dilatation noted. 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 There is mild dilatation of the ascending aorta, measuring 40 mm. Comparison(s): Prior images unable to be directly viewed, comparison made by report only. Changes from prior study are noted. 02/18/2015: LVEF 55-60%, moderate LVH, moderate LAE, dilated aorta to 40 mm. FINDINGS  Left Ventricle: Left ventricular ejection fraction, by estimation, is 60 to 65%. The left ventricle has normal function. The left ventricle has no regional wall motion abnormalities. Definity contrast agent was given IV to delineate the left ventricular  endocardial borders. The left ventricular internal cavity size was normal in size. There is severe left ventricular hypertrophy. Left ventricular diastolic function could not be evaluated due to atrial fibrillation. Left ventricular diastolic function could not be evaluated. Right Ventricle: The right ventricular size is normal. No increase in right ventricular wall thickness. Right ventricular systolic function is mildly reduced. Left Atrium: Left atrial size was mild to moderately dilated. Right Atrium: Right atrial size was normal in size. Pericardium: There is no evidence of pericardial effusion. Mitral Valve: The mitral valve is grossly normal. Trivial mitral valve regurgitation. Tricuspid Valve: The tricuspid valve is grossly normal. Tricuspid valve regurgitation is trivial. Aortic Valve: The aortic valve is tricuspid. Aortic valve regurgitation is not visualized. Aortic valve mean gradient measures 2.0 mmHg. Aortic valve peak gradient measures 3.4 mmHg. Aortic  valve area, by VTI measures 3.88 cm. Pulmonic Valve: The pulmonic valve was not well visualized. Pulmonic valve regurgitation is not visualized. Aorta: Aortic dilatation noted. 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 There is mild dilatation of the  ascending aorta, measuring 40 mm. Venous: The inferior vena cava was not well visualized. IAS/Shunts: No atrial level shunt detected by color flow Doppler.  LEFT VENTRICLE PLAX 2D LVIDd:         4.20 cm LVIDs:         2.80 cm LV PW:         1.70 cm LV IVS:        1.80 cm LVOT diam:     2.30 cm LV SV:         64 LV SV Index:   25 LVOT Area:     4.15 cm  RIGHT VENTRICLE RV Basal diam:  3.50 cm RV Mid diam:    2.70 cm RV S prime:     8.92 cm/s LEFT ATRIUM             Index       RIGHT ATRIUM           Index LA diam:        3.50 cm 1.37 cm/m  RA Area:     21.20 cm LA Vol (A2C):   72.8 ml 28.45 ml/m RA Volume:   53.10 ml  20.75 ml/m LA Vol (A4C):   95.5 ml 37.33 ml/m LA Biplane Vol: 86.4 ml 33.77 ml/m  AORTIC VALVE AV Area (Vmax):    3.25 cm AV Area (Vmean):   3.06 cm AV Area (VTI):     3.88 cm AV Vmax:           92.70 cm/s AV Vmean:          68.600 cm/s AV VTI:            0.166 m AV Peak Grad:      3.4 mmHg AV Mean Grad:      2.0 mmHg LVOT Vmax:         72.50 cm/s LVOT Vmean:        50.500 cm/s LVOT VTI:          0.155 m LVOT/AV VTI ratio: 0.93  AORTA Ao Root diam: 4.30 cm Ao Asc diam:  4.00 cm TRICUSPID VALVE TR Peak grad:   34.3 mmHg TR Vmax:        293.00 cm/s  SHUNTS Systemic VTI:  0.16 m Systemic Diam: 2.30 cm Lyman Bishop MD Electronically signed by Lyman Bishop MD Signature Date/Time: 03/10/2020/10:05:12 AM    Final      Time coordinating discharge: Over 30 minutes    Dwyane Dee, MD  [facility] 03/13/2020, 2:53 PM

## 2020-03-17 ENCOUNTER — Ambulatory Visit: Payer: Self-pay | Admitting: Pharmacist

## 2020-03-17 DIAGNOSIS — I509 Heart failure, unspecified: Secondary | ICD-10-CM

## 2020-03-17 DIAGNOSIS — I10 Essential (primary) hypertension: Secondary | ICD-10-CM

## 2020-03-17 NOTE — Chronic Care Management (AMB) (Addendum)
Chronic Care Management Pharmacy  Name: Garrett Munoz  MRN: [id number] DOB: Sep 10, 1945  Chief Complaint/ HPI  Garrett Munoz,  74 y.o. , male presents for their Follow-Up CCM visit with the clinical pharmacist in office.  PCP : Garrett Frizzle, MD  Their chronic conditions include: hypertension, atrial fibrilation, chronic pain.  Office Visits:  06/04/2019 Garrett Munoz) - suspected COPD, on exam suspected pulmonary edema, using Breztri twice daily for suspected COPD  Consult Visit:  08/13/2019 Garrett Munoz, ED) - reported with NVD, given fluids and Imodium and d/c home feeling much better  03/02/2019 Garrett Munoz, Cardiology) - mild bilateral ankle edema - started on HCTZ 12.5 mg  Medications: Outpatient Encounter Medications as of 03/17/2020  Medication Sig   allopurinol (ZYLOPRIM) 300 MG tablet TAKE 1 TABLET (300 MG TOTAL) BY MOUTH DAILY.   apixaban (ELIQUIS) 5 MG TABS tablet Take 1 tablet (5 mg total) by mouth 2 (two) times daily.   carvedilol (COREG) 12.5 MG tablet TAKE 1 & 1/2 TABLETS BY MOUTH EVERY MORNING AND 1 TABLET IN THE EVENING (Patient taking differently: Take 12.5-18.75 mg by mouth See admin instructions. TAKE 18.75mg  BY MOUTH EVERY MORNING AND 12.5 IN THE EVENING)   fluticasone (FLONASE) 50 MCG/ACT nasal spray SHAKE LIQUID AND USE 2 SPRAYS IN EACH NOSTRIL DAILY (Patient taking differently: Place 2 sprays into both nostrils daily as needed for allergies or rhinitis. )   furosemide (LASIX) 80 MG tablet Take 1 tablet (80 mg total) by mouth daily.   gabapentin (NEURONTIN) 300 MG capsule Take 600 mg by mouth at bedtime.   HYDROcodone-acetaminophen (NORCO) 10-325 MG tablet Take 1 tablet by mouth 3 (three) times daily as needed for pain.   Multiple Vitamins-Minerals (MULTIVITAMIN WITH MINERALS) tablet Take 1 tablet by mouth daily.   temazepam (RESTORIL) 30 MG capsule TAKE 1 CAPSULE BY MOUTH AT BEDTIME AS NEEDED FOR SLEEP. (Patient taking differently: Take 30 mg by mouth at  bedtime. )   No facility-administered encounter medications on file as of 03/17/2020.     Current Diagnosis/Assessment:    [employer]: Low Risk    Difficulty of Paying Living Expenses: Not very hard     Goals Addressed             This Visit's Progress    Pharmacy Care Plan:       CARE PLAN ENTRY  Current Barriers:  Chronic Disease Management support, education, and care coordination needs related to HTN, Chronic pain, and heart failure.   Hypertension Pharmacist Clinical Goal(s): Over the next 180 days, patient will work with PharmD and providers to maintain BP goal <130/80 Current regimen:  HCTZ 25mg  daily, carvedilol 12.5mg   Interventions: Comprehensive medication review Continue current therapy Patient self care activities - Over the next 180 days, patient will: Check BP once daily, document, and provide at future appointments Ensure daily salt intake < 2300 mg/day Contact PharmD or PCP with consistent blood pressure readings > 130/80. Degenerative Disc Disease Pharmacist Clinical Goal(s) Over the next 180 days, patient will work with PharmD and providers to optimize medication related to pain and minimize symptoms. Current regimen:  Tylenol 325mg  as needed Interventions: Comprehensive medications review Recommend appointment with Dr. Dennard Munoz to discuss current pain Patient self care activities - Over the next 180 days, patient will: Set up appointment with Dr. Dennard Munoz to discuss current pain and treatment options. Contact PharmD or PCP with worsening symptoms. Heart Failure Pharmacist Clinical Goal(s) Over the next 180 days, patient will work with PharmD  and providers to optimize medication and minimize symptoms of HF. Current regimen:  Furosemide 80mg  daily Interventions: Comprehensive medications review Counseled on weight monitoring and parameters Patient self care activities - Over the next 180 days, patient will: Monitor weight,  notify providers of >3 lb daily weight gain, or > 5 lbs in a week. Contact PharmD or PCP with worsening symptoms.  Please see past updates related to this goal by clicking on the "Past Updates" button in the selected goal          AFIB   Patient is currently rate controlled.   Patient has failed these meds in past: none noted Patient is currently controlled on the following medications:  Carvedilol 12.5 mg Eliquis 5mg  bid  We discussed: No concerns noted with medication.   No signs of dizziness from carvedilol.  Plan  Continue current medications  ,  Hypertension    Office blood pressures are  BP Readings from Last 3 Encounters:  03/12/20 123/75  08/13/19 (!) 132/98  06/04/19 (!) 152/86    Patient has failed these meds in the past: metoprolol succinate 50mg  daily  Patient checks BP at home daily   Patient is currently controlled on the following medications: HCTZ 25mg  daily,  Patient home BP readings are ranging: no recent logs available  Patient adherent to medication, wife monitors BP at least once daily no concerns with medications.    Plan  Continue current medications.  Continue to monitor BP.  Notify PharmD or PCP with consistent blood pressure readings > 130/80.      Degenerative disc/pain    Patient has failed these meds in past: none noted  Patient is currently uncontrolled on the following medications: Tylenol 325mg  prn We discussed:   patient's main concern at this visit was pain .  Recommended they follow up with Dr. Dennard Munoz for persistent pain as he is currently just treating with Tylenol.  Plan  Continue current medications. Follow up with Dr. Dennard Munoz for appointment on ongoing pain.  Acute Congestive Heart Failure     Last ejection fraction: 60-65% Patient has failed these meds in past: none noted Patient is currently controlled on the following medications:  Furosemide 80mg  daily  We discussed  He was started on this  medication after recent hospital visit due to fluid overload. Reports his fluid is down and he is feeling much better, does complain of frequent urination at night. Discussed importance of weight monitoring and counseled on monitoring for > 3 lbs of weight gain in one day or > 5lbs in a week to notify providers.  Patient reports he is currently monitoring weight. Denies any SOB since hospital stay.  Plan  Continue current medications  Vaccines   Reviewed and discussed patient's vaccination history.    Immunization History  Administered Date(s) Administered   Fluad Quad(high Dose 65+) 12/20/2018, 01/09/2020   Influenza, High Dose Seasonal PF 01/04/2017   Influenza,inj,Quad PF,6+ Mos 01/04/2018   Influenza-Unspecified 01/18/2016, 01/24/2017   Moderna SARS-COVID-2 Vaccination 05/01/2019, 05/28/2019, 12/25/2019   Pneumococcal Conjugate-13 01/04/2017, 02/17/2017   Pneumococcal-Unspecified 02/17/2017   Zoster Recombinat (Shingrix) 12/07/2017, 02/13/2018    Plan  Recommended patient receive Tdap vaccine in office/pharmacy.  Medication Management   Miscellaneous medications: temazepam 30mg ,  OTC's: APAP 325mg , Flonase 9mcg, Patient currently uses [facility].  Phone #  [phone] Patient reports using pill box method to organize medications and promote adherence. Patient denies missed doses of medication.   Beverly Milch, PharmD Clinical Pharmacist [facility] [phone] I have collaborated with the care management provider regarding care management and care coordination activities outlined in this encounter and have reviewed this encounter including documentation in the note and care plan. I am certifying that I agree with the content of this note and encounter as supervising physician.

## 2020-03-17 NOTE — Patient Instructions (Addendum)
Visit Information  Goals Addressed            This Visit's Progress   . Pharmacy Care Plan:       CARE PLAN ENTRY  Current Barriers:  . Chronic Disease Management support, education, and care coordination needs related to HTN, Chronic pain, and heart failure.   Hypertension . Pharmacist Clinical Goal(s): o Over the next 180 days, patient will work with PharmD and providers to maintain BP goal <130/80 . Current regimen:  o HCTZ 25mg  daily, carvedilol 12.5mg   . Interventions: o Comprehensive medication review o Continue current therapy . Patient self care activities - Over the next 180 days, patient will: o Check BP once daily, document, and provide at future appointments o Ensure daily salt intake < 2300 mg/day o Contact PharmD or PCP with consistent blood pressure readings > 130/80. Degenerative Disc Disease . Pharmacist Clinical Goal(s) o Over the next 180 days, patient will work with PharmD and providers to optimize medication related to pain and minimize symptoms. . Current regimen:  o Tylenol 325mg  as needed . Interventions: o Comprehensive medications review o Recommend appointment with Dr. Dennard Schaumann to discuss current pain . Patient self care activities - Over the next 180 days, patient will: o Set up appointment with Dr. Dennard Schaumann to discuss current pain and treatment options. o Contact PharmD or PCP with worsening symptoms. Heart Failure . Pharmacist Clinical Goal(s) o Over the next 180 days, patient will work with PharmD and providers to optimize medication and minimize symptoms of HF. . Current regimen:  o Furosemide 80mg  daily . Interventions: o Comprehensive medications review o Counseled on weight monitoring and parameters . Patient self care activities - Over the next 180 days, patient will: o Monitor weight, notify providers of >3 lb daily weight gain, or > 5 lbs in a week. o Contact PharmD or PCP with worsening symptoms.  Please see past updates related to  this goal by clicking on the "Past Updates" button in the selected goal         The patient verbalized understanding of instructions, educational materials, and care plan provided today and agreed to receive a mailed copy of patient instructions, educational materials, and care plan.   Telephone follow up appointment with pharmacy team member scheduled for: 6 months  Beverly Milch, PharmD Clinical Pharmacist [facility] [phone]   Heart Failure, Self Care Heart failure is a serious condition. This document explains the things you need to do to take care of yourself after a heart failure diagnosis. You may be asked to change your diet, take certain medicines, and make other lifestyle changes in order to stay as healthy as possible. Your health care provider may also give you more specific instructions. If you have problems or questions, contact your health care provider. What are the risks? Having heart failure puts you at higher risk for certain problems. These problems can get worse if you do not take good care of yourself. Problems may include:  Blood clotting problems. This may cause a stroke.  Damage to the kidneys, liver, or lungs.  Abnormal heart rhythms. Supplies needed:  Scale for monitoring weight.  Blood pressure monitor.  Notebook.  Medicines. How to care for yourself when you have heart failure Medicines Take over-the-counter and prescription medicines only as told by your health care provider. Medicines reduce the workload of your heart, slow the progression of heart failure, and improve symptoms. Take your medicines every day.  Do not stop taking  your medicine unless your health care provider tells you to do so.  Do not skip any dose of medicine.  Refill your prescriptions before you run out of medicine. Eating and drinking   Eat heart-healthy foods. Talk with a dietitian to make an eating plan that is right for you. ? Choose  foods that contain no trans fat and are low in saturated fat and cholesterol. Healthy choices include fresh or frozen fruits and vegetables, fish, lean meats, legumes, fat-free or low-fat dairy products, and whole-grain or high-fiber foods. ? Limit salt (sodium) if told by your health care provider. Sodium restriction may reduce symptoms of heart failure. Ask a dietitian to recommend heart-healthy seasonings. ? Use healthy cooking methods instead of frying. Healthy methods include roasting, grilling, broiling, baking, poaching, steaming, and stir-frying.  Limit your fluid intake, if directed by your health care provider. Fluid restriction may reduce symptoms of heart failure. Alcohol use  Do not drink alcohol if: ? Your health care provider tells you not to drink. ? Your heart was damaged by alcohol, or you have severe heart failure. ? You are pregnant, may be pregnant, or are planning to become pregnant.  If you drink alcohol: ? Limit how much you use to:  0-1 drink a day for women.  0-2 drinks a day for men. ? Be aware of how much alcohol is in your drink. In the [place] one drink equals one 12 oz bottle of beer (355 mL), one 5 oz glass of wine (148 mL), or one 1 oz glass of hard liquor (44 mL). Lifestyle   Do not use any products that contain nicotine or tobacco, such as cigarettes, e-cigarettes, and chewing tobacco. If you need help quitting, ask your health care provider. ? Do not use nicotine gum or patches before talking to your health care provider.  Do not use illegal drugs.  Work with your health care provider to safely reach the right body weight.  Do physical activity if told by your health care provider. Talk to your health care provider before you begin an exercise if: ? You are an older adult. ? You have severe heart failure.  Learn to manage stress. If you need help to do this, ask your health care provider.  Participate in or seek rehabilitation as needed to keep or  improve your independence and quality of life.  Plan rest periods when you get tired. Monitoring important information   Weigh yourself every day. This will help you to notice if too much fluid is building up in your body. ? Weigh yourself every morning after you urinate and before you eat breakfast. ? Wear the same amount of clothing each time you weigh yourself. ? Record your daily weight. Provide your health care provider with your weight record.  Monitor and record your pulse and blood pressure as told by your health care provider. Dealing with extreme temperatures  If the weather is extremely hot: ? Avoid vigorous physical activity. ? Use air conditioning or fans, or find a cooler location. ? Avoid caffeine and alcohol. ? Wear loose-fitting, lightweight, and light-colored clothing.  If the weather is extremely cold: ? Avoid vigorous activity. ? Layer your clothes. ? Wear mittens or gloves, a hat, and a scarf when you go outside. ? Avoid alcohol. Follow these instructions at home:  Stay up to date with vaccines. Pneumococcal and flu (influenza) vaccines are especially important in preventing infections of the airways.  Keep all follow-up visits as told by your  health care provider. This is important. Contact a health care provider if you:  Have a rapid weight gain.  Have increasing shortness of breath.  Are unable to participate in your usual physical activities.  Get tired easily.  Cough more than normal, especially with physical activity.  Lose your appetite or feel nauseous.  Have any swelling or more swelling in areas such as your hands, feet, ankles, or abdomen.  Are unable to sleep because it is hard to breathe.  Feel like your heart is beating quickly (palpitations).  Become dizzy or light-headed when you stand up. Get help right away if you:  Have trouble breathing.  Notice or your family notices a change in your awareness, such as having trouble  staying awake or concentrating.  Have pain or discomfort in your chest.  Have an episode of fainting (syncope). These symptoms may represent a serious problem that is an emergency. Do not wait to see if the symptoms will go away. Get medical help right away. Call your local emergency services (911 in the [place] Do not drive yourself to the hospital. Summary  Heart failure is a serious condition. To care for yourself, you may be asked to change your diet, take certain medicines, and make other lifestyle changes.  Take your medicines every day. Do not stop taking them unless your health care provider tells you to do so.  Eat heart-healthy foods, such as fresh or frozen fruits and vegetables, fish, lean meats, legumes, fat-free or low-fat dairy products, and whole-grain or high-fiber foods.  Ask your health care provider if you have any alcohol restrictions. You may have to stop drinking alcohol if you have severe heart failure.  Contact your health care provider if you notice problems, such as rapid weight gain or a fast heartbeat. Get help right away if you faint, or have chest pain or trouble breathing. This information is not intended to replace advice given to you by your health care provider. Make sure you discuss any questions you have with your health care provider. Document Revised: 07/18/2018 Document Reviewed: 07/19/2018 Elsevier Patient Education  [place].

## 2020-03-24 ENCOUNTER — Other Ambulatory Visit: Payer: Self-pay

## 2020-03-24 ENCOUNTER — Encounter: Payer: Self-pay | Admitting: Family Medicine

## 2020-03-24 ENCOUNTER — Ambulatory Visit (INDEPENDENT_AMBULATORY_CARE_PROVIDER_SITE_OTHER): Payer: PPO | Admitting: Family Medicine

## 2020-03-24 ENCOUNTER — Other Ambulatory Visit: Payer: Self-pay | Admitting: Family Medicine

## 2020-03-24 VITALS — BP 136/88 | HR 95 | Temp 98.0°F | Resp 18 | Ht 71.0 in | Wt 308.0 lb

## 2020-03-24 DIAGNOSIS — I509 Heart failure, unspecified: Secondary | ICD-10-CM | POA: Diagnosis not present

## 2020-03-24 DIAGNOSIS — I5033 Acute on chronic diastolic (congestive) heart failure: Secondary | ICD-10-CM | POA: Diagnosis not present

## 2020-03-24 DIAGNOSIS — Z09 Encounter for follow-up examination after completed treatment for conditions other than malignant neoplasm: Secondary | ICD-10-CM

## 2020-03-24 DIAGNOSIS — J441 Chronic obstructive pulmonary disease with (acute) exacerbation: Secondary | ICD-10-CM

## 2020-03-24 DIAGNOSIS — I4819 Other persistent atrial fibrillation: Secondary | ICD-10-CM

## 2020-03-24 DIAGNOSIS — G4733 Obstructive sleep apnea (adult) (pediatric): Secondary | ICD-10-CM

## 2020-03-24 MED ORDER — ANORO ELLIPTA 62.5-25 MCG/INH IN AEPB: 1.0000 | INHALATION_SPRAY | Freq: Every day | RESPIRATORY_TRACT | 11 refills | Status: DC

## 2020-03-24 MED FILL — ANORO ELLIPTA 62.5-25 MCG I: 62.5-25 | 30 days supply | Qty: 60 | Fill #0

## 2020-03-24 NOTE — Progress Notes (Signed)
Subjective:    Patient ID: Garrett Munoz, male    DOB: Nov 11, 1945, 74 y.o.   MRN: [id number]  HPI Admit date: 03/09/2020 Discharge date: 03/13/2020  Admitted From: home Disposition:  home Discharging physician: Dwyane Dee, MD  Recommendations for Outpatient Follow-up:  1. Outpt sleep study 2. Repeat BMP given d/c with lasix   Patient discharged to home in Discharge Condition: stable CODE STATUS: Full Diet recommendation:     Diet Orders (From admission, onward)    Start     Ordered    03/12/20 0000  Diet - low sodium heart healthy        03/12/20 1137           Hospital Course: [name] is a 74 yo male with PMH chronic A. fib on Eliquis, multiple joints OA, chronic bronchitis, morbid obesity, hypertension, gout who presented with SOB and swelling.  Patient has chronic A. fib has been following with cardiologist Dr. Claiborne Billings, most recent visit was January this year. No history of CHF documented. And according to patient's wife, patient'sA. fib has been fairly controlled. Recently patient has had worsening of back pain for which he has been following with pain management doctor. No new medication recent months.  Patient started to have increasing swelling on both legs 3 to 4 weeks ago, along with increasing shortness of breath initially was exertional and became worse this week. Wife also noticed patient has had increasing cough and wheezing since last week has been using more albuterol nebulizer, patient also has history of OSA not been compliant with CPAP. Denies any chest pain, no fever chills, no chest pain no urinary symptoms no diarrhea. Wife also noticed that the patient has been having "fingers nails and toes discoloration recently"  Chest x-ray showed cardiomegaly and pulmonary edema, patient was found to be hypoxic.Patient became hypoxic in the ED, very agitated remove oxygen and O2 sat ration dropped to upper 50s and then became lethargic.  He was eventually placed on BiPAP. He had hypercarbic respiratory failure on blood gas monitoring which improved with ongoing BiPAP therapy. He was also started on diuresis with Lasix given his volume overload. An echo was also ordered. EF 60-65%, Severe LVH.  Mildly reduced right ventricular systolic function. Pulmonology was consulted on admission as well. He will need outpatient formal PFTs. Prior to discharge he was able to be weaned off of oxygen to room air and ambulated well in the hallway with no desaturation events.  He was also arranged for home health as well as NIV but has a scheduled sleep study at time of discharge as well.   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 Needs to be adequately diuresed then obtain outpatient PFTs -Required continuous BiPAP on admission which has now been weaned off. Continue nightly BiPAP as well as as needed -Continue Lasix -Appreciate pulmonology evaluation -Discontinued doxycycline and steroids -Continue duo nebs for now  Acute CHF (congestive heart failure) ([place]) -Patient had clinical signs of volume overload on admission. BNP elevated, 261 -Echo also obtained on admission: EF 60 to 65%, severe LVH. Diastolic dysfunction could not be evaluated. RV systolic function mildly reduced. No RVH. - continue lasix  Acute metabolic encephalopathy-resolved as of 03/10/2020 -Likely due to hypercarbia on admission. This has improved -Continue monitoring for any further mentation changes  Physical deconditioning -We discussed his current status at length bedside with him  and his wife.  He is declining going to SNF/rehab at this time and wishes to go home with home health.  Wife also bedside for the conversation and in agreement and comfortable taking patient home.  Informed patient that he could change his mind if he  wanted rehab at any point.  For now, tentative plan will be for home with home health PT at discharge  Osteoarthritis - continue current pain regimen; caution with oversedation   Prediabetes -A1c 5.8% -Continue diet control  Atrial fibrillation (HCC) -Continue Coreg and Eliquis  Gout -Uric acid level 4.3. Continue to avoid HCTZ  Patient is a very pleasant 74 year old Caucasian male who is here today for hospital discharge follow-up.  He was admitted with pulmonary edema and hypercapnic hypoxic respiratory failure.  In addition to pulmonary edema from acute congestive heart failure, it was felt that he was likely having a COPD exacerbation as well.  His wife states that he had been wheezing for several weeks prior to his hospital admission.  He was also gaining weight prior to his hospital admission.  His wife states that he was up to 322 pounds on admission to the hospital.  I reviewed his echocardiogram.  Ejection fraction was 60 to 65%.  Study limitations prevented adequate assessment of his diastolic dysfunction.  I feel that the patient most likely has diastolic heart failure and possible even right-sided heart failure due to a combination of morbid obesity, untreated obstructive sleep apnea.  Patient is unable to tolerate his BiPAP at night due to claustrophobia.  He states that he is tried a number of different devices and has been unable to tolerate any of the mask.  Therefore his obstructive sleep apnea remains untreated.  I tried the patient on Breztri for suspected COPD.  However the patient due to cost it only been using it as needed.  He is not been on a daily long-acting bronchodilator prior to his hospital admission.  Today on examination he still has +1 pitting edema in his legs up to his mid shin however his lungs are clear to auscultation bilaterally except for a few scattered expiratory wheezes Past Medical History:  Diagnosis Date  . Atrial fibrillation ([place])   . Bronchitis     hx  . Cataract    right eye  . DDD (degenerative disc disease), lumbosacral   . Dysrhythmia    irregular heatbeat  . Eye worm    right eye  . Gout   . History of kidney stones   . Hypertension   . Left knee DJD   . Morbid obesity with BMI of 40.0-44.9, adult ([place])   . Pneumonia    hx  . Prediabetes   . Radicular syndrome of left leg   . S/P total knee replacement    right  . Sleep apnea    can't use sleep study 10 yrs ago  . 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    Past Surgical History:  Procedure Laterality Date  . CARDIOVERSION N/A 03/04/2015   Procedure: CARDIOVERSION;  Surgeon: [name], MD;  Location: [facility];  Service: Cardiovascular;  Laterality: N/A;  . CHOLECYSTECTOMY    . COLONOSCOPY N/A 01/01/2016   Procedure: COLONOSCOPY;  Surgeon: [name], MD;  [facility];  Service: Endoscopy;  Laterality: N/A;  1:45  . COLONOSCOPY N/A 01/14/2017   Procedure: COLONOSCOPY;  Surgeon: [name], MD;  [facility];  Service: Endoscopy;  Laterality: N/A;  12:05  .  EYE EXAMINATION UNDER ANESTHESIA W/ RETINAL CRYOTHERAPY AND RETINAL LASER  07/2011  . EYE SURGERY  02/2011   cat rt  . KNEE ARTHROSCOPY     bilateral  . LUMBAR LAMINECTOMY/DECOMPRESSION MICRODISCECTOMY N/A 08/18/2017   Procedure: Bilateral microlumbar decompression Lumbar four-Lumbar five;  Surgeon: [name], MD;  Location: [facility];  Service: Orthopedics;  Laterality: N/A;  . POLYPECTOMY  01/01/2016   Procedure: POLYPECTOMY;  Surgeon: [name], MD;  [facility];  Service: Endoscopy;;  colon  . RETINAL DETACHMENT SURGERY  03/2011   rt  . TOTAL KNEE ARTHROPLASTY  2003   rt  . TOTAL KNEE ARTHROPLASTY  02/07/2012   Procedure: TOTAL KNEE ARTHROPLASTY;  Surgeon: [name], MD;  Location: [facility];  Service: Orthopedics;  Laterality: Left;   Current Outpatient Medications on File Prior to Visit  Medication Sig Dispense Refill  . allopurinol (ZYLOPRIM) 300 MG  tablet TAKE 1 TABLET (300 MG TOTAL) BY MOUTH DAILY. 90 tablet 3  . apixaban (ELIQUIS) 5 MG TABS tablet Take 1 tablet (5 mg total) by mouth 2 (two) times daily. 180 tablet 1  . carvedilol (COREG) 12.5 MG tablet TAKE 1 & 1/2 TABLETS BY MOUTH EVERY MORNING AND 1 TABLET IN THE EVENING (Patient taking differently: Take 12.5-18.75 mg by mouth See admin instructions. TAKE 18.75mg  BY MOUTH EVERY MORNING AND 12.5 IN THE EVENING) 225 tablet 3  . fluticasone (FLONASE) 50 MCG/ACT nasal spray SHAKE LIQUID AND USE 2 SPRAYS IN EACH NOSTRIL DAILY (Patient taking differently: Place 2 sprays into both nostrils daily as needed for allergies or rhinitis. ) 16 g 6  . furosemide (LASIX) 80 MG tablet Take 1 tablet (80 mg total) by mouth daily. 30 tablet 3  . gabapentin (NEURONTIN) 300 MG capsule Take 600 mg by mouth at bedtime.    [name] HYDROcodone-acetaminophen (NORCO) 10-325 MG tablet Take 1 tablet by mouth 3 (three) times daily as needed for pain.    . Multiple Vitamins-Minerals (MULTIVITAMIN WITH MINERALS) tablet Take 1 tablet by mouth daily. 120 tablet 2  . temazepam (RESTORIL) 30 MG capsule TAKE 1 CAPSULE BY MOUTH AT BEDTIME AS NEEDED FOR SLEEP. (Patient taking differently: Take 30 mg by mouth at bedtime. ) 30 capsule 2   No current facility-administered medications on file prior to visit.   No Known Allergies Social History   Socioeconomic History  . Marital status: Married    Spouse name: Not on file  . Number of children: Not on file  . Years of education: Not on file  . Highest education level: Not on file  Occupational History  . Not on file  Tobacco Use  . Smoking status: Former Smoker    Packs/day: 2.00    Years: 15.00    Pack years: 30.00    Types: Cigarettes    Quit date: 02/02/1992    Years since quitting: 28.1  . Smokeless tobacco: Never Used  . Tobacco comment: occ alcohol  Vaping Use  . Vaping Use: Never used  Substance and Sexual Activity  . Alcohol use: Yes    Alcohol/week: 0.0  standard drinks    Comment: occasional  . Drug use: No  . Sexual activity: Not on file  Other Topics Concern  . Not on file  Social History Narrative  . Not on file   Social Determinants of Health   Financial Resource Strain: Low Risk   . Difficulty of Paying Living Expenses: Not very hard  Food Insecurity:   . Worried  About Running Out of Food in the Last Year: Not on file  . Ran Out of Food in the Last Year: Not on file  Transportation Needs:   . Lack of Transportation (Medical): Not on file  . Lack of Transportation (Non-Medical): Not on file  Physical Activity:   . Days of Exercise per Week: Not on file  . Minutes of Exercise per Session: Not on file  Stress:   . Feeling of Stress : Not on file  Social Connections:   . Frequency of Communication with Friends and Family: Not on file  . Frequency of Social Gatherings with Friends and Family: Not on file  . Attends Religious Services: Not on file  . Active Member of Clubs or Organizations: Not on file  . Attends Archivist Meetings: Not on file  . Marital Status: Not on file  Intimate Partner Violence:   . Fear of Current or Ex-Partner: Not on file  . Emotionally Abused: Not on file  . Physically Abused: Not on file  . Sexually Abused: Not on file      Review of Systems  All other systems reviewed and are negative.      Objective:   Physical Exam Vitals reviewed.  Constitutional:      General: He is not in acute distress.    Appearance: He is well-developed. He is not diaphoretic.  HENT:     Right Ear: External ear normal.     Left Ear: External ear normal.     Nose: Nose normal.     Mouth/Throat:     Pharynx: No oropharyngeal exudate.  Neck:     Thyroid: No thyromegaly.     Vascular: No JVD.     Trachea: No tracheal deviation.  Cardiovascular:     Rate and Rhythm: Normal rate. Rhythm irregularly irregular.     Heart sounds: Normal heart sounds.  Pulmonary:     Effort: Pulmonary effort is  normal.     Breath sounds: Wheezing present. No rhonchi or rales.  Chest:     Chest wall: No tenderness.  Abdominal:     General: Bowel sounds are normal. There is distension.     Palpations: Abdomen is soft. There is no mass.     Tenderness: There is no abdominal tenderness. There is no guarding or rebound.  Musculoskeletal:     Cervical back: Neck supple.     Lumbar back: Tenderness present. No deformity. Decreased range of motion.     Right lower leg: Edema present.     Left lower leg: Edema present.  Lymphadenopathy:     Cervical: No cervical adenopathy.  Skin:    Coloration: Skin is not pale.     Findings: No erythema or rash.  Neurological:     Mental Status: He is alert and oriented to person, place, and time.     Cranial Nerves: No cranial nerve deficit.     Motor: No abnormal muscle tone.     Coordination: Coordination normal.     Deep Tendon Reflexes: Reflexes normal.  Psychiatric:        Behavior: Behavior normal.        Thought Content: Thought content normal.        Judgment: Judgment normal.           Assessment & Plan:  Chronic congestive heart failure, unspecified heart failure type ([place]) - Plan: CBC with Differential/Platelet, BASIC METABOLIC PANEL WITH GFR  Persistent atrial fibrillation ([place]) - Plan: CBC with  Differential/Platelet, BASIC METABOLIC PANEL WITH GFR  OSA (obstructive sleep apnea)  Hospital discharge follow-up - Plan: CBC with Differential/Platelet, BASIC METABOLIC PANEL WITH GFR  COPD with acute exacerbation [facility])  Patient has an appointment in January to see the pulmonologist however I want to start him on a long-acting bronchodilator.  Due to the risk of pneumonia I will avoid an inhaled corticosteroid at the present time but I will start the patient on Anoro 1 inhalation daily.  I believe that untreated COPD is likely contributing to his diastolic dysfunction and possible right-sided heart failure.  Also explained to the patient that  untreated sleep apnea and morbid obesity both contribute as well.  Therefore I encouraged the patient to try to lose weight by any means necessary especially caloric restriction.  I would also encourage him to try again to treat the obstructive sleep apnea with BiPAP.  Blood pressure is currently well controlled.  I will check a BMP and a CBC to monitor his electrolytes and creatinine on 80 mg of Lasix.  I believe he has virtually euvolemic and therefore I would not change his Lasix dose unless his electrolytes are abnormal.

## 2020-03-25 LAB — CBC WITH DIFFERENTIAL/PLATELET
Absolute Monocytes: 760 cells/uL (ref 200–950)
Basophils Absolute: 40 cells/uL (ref 0–200)
Basophils Relative: 0.5 %
Eosinophils Absolute: 360 cells/uL (ref 15–500)
Eosinophils Relative: 4.5 %
HCT: 43.6 % (ref 38.5–50.0)
Hemoglobin: 15 g/dL (ref 13.2–17.1)
Lymphs Abs: 1720 cells/uL (ref 850–3900)
MCH: 31.8 pg (ref 27.0–33.0)
MCHC: 34.4 g/dL (ref 32.0–36.0)
MCV: 92.4 fL (ref 80.0–100.0)
MPV: 11.3 fL (ref 7.5–12.5)
Monocytes Relative: 9.5 %
Neutro Abs: 5120 cells/uL (ref 1500–7800)
Neutrophils Relative %: 64 %
Platelets: 231 10*3/uL (ref 140–400)
RBC: 4.72 10*6/uL (ref 4.20–5.80)
RDW: 12.7 % (ref 11.0–15.0)
Total Lymphocyte: 21.5 %
WBC: 8 10*3/uL (ref 3.8–10.8)

## 2020-03-25 LAB — BASIC METABOLIC PANEL WITH GFR
BUN: 12 mg/dL (ref 7–25)
CO2: 36 mmol/L — ABNORMAL HIGH (ref 20–32)
Calcium: 8.9 mg/dL (ref 8.6–10.3)
Chloride: 93 mmol/L — ABNORMAL LOW (ref 98–110)
Creat: 0.87 mg/dL (ref 0.70–1.18)
GFR, Est African American: 97 mL/min/{1.73_m2} (ref >=60)
GFR, Est Non African American: 84 mL/min/{1.73_m2} (ref >=60)
Glucose, Bld: 108 mg/dL — ABNORMAL HIGH (ref 65–99)
Potassium: 3.4 mmol/L — ABNORMAL LOW (ref 3.5–5.3)
Sodium: 140 mmol/L (ref 135–146)

## 2020-03-26 ENCOUNTER — Other Ambulatory Visit: Payer: Self-pay

## 2020-03-26 MED ORDER — POTASSIUM CHLORIDE CRYS ER 20 MEQ PO TBCR: 20.0000 meq | EXTENDED_RELEASE_TABLET | Freq: Every day | ORAL | 3 refills | Status: DC

## 2020-03-26 MED FILL — POTASSIUM CHLORIDE CRYS ER: 20 | 30 days supply | Qty: 30 | Fill #0

## 2020-03-31 ENCOUNTER — Telehealth: Payer: Self-pay | Admitting: Family Medicine

## 2020-03-31 ENCOUNTER — Other Ambulatory Visit: Payer: Self-pay

## 2020-03-31 ENCOUNTER — Ambulatory Visit (INDEPENDENT_AMBULATORY_CARE_PROVIDER_SITE_OTHER): Payer: PPO | Admitting: Family Medicine

## 2020-03-31 ENCOUNTER — Other Ambulatory Visit: Payer: Self-pay | Admitting: Family Medicine

## 2020-03-31 VITALS — BP 110/70 | HR 96 | Temp 97.5°F | Ht 71.0 in | Wt 311.0 lb

## 2020-03-31 DIAGNOSIS — M2550 Pain in unspecified joint: Secondary | ICD-10-CM

## 2020-03-31 MED ORDER — PREDNISONE 20 MG PO TABS: 20.0000 mg | ORAL_TABLET | Freq: Every day | ORAL | 0 refills | Status: DC

## 2020-03-31 MED FILL — predniSONE 20 MG TABS: 20 | 30 days supply | Qty: 30 | Fill #0

## 2020-03-31 NOTE — Telephone Encounter (Signed)
Patients wife left vm on 03/29/20 @ 7:43 stating pt  got out of hospital hurting really bad in all joints hard to walk. She would like to know if this coming from his potassium and or lasix.  CB# [phone]  FYI: patient is coming in today.

## 2020-03-31 NOTE — Progress Notes (Signed)
Subjective:    Patient ID: Garrett Munoz, male    DOB: [birth year], 74 y.o.   MRN: [id number]  HPI Admit date: 03/09/2020 Discharge date: 03/13/2020  Admitted From: home Disposition:  home Discharging physician: Dwyane Dee, MD  Recommendations for Outpatient Follow-up:  1. Outpt sleep study 2. Repeat BMP given d/c with lasix   Patient discharged to home in Discharge Condition: stable CODE STATUS: Full Diet recommendation:     Diet Orders (From admission, onward)           Start     Ordered    03/12/20 0000  Diet - low sodium heart healthy        03/12/20 1137           Hospital Course: [name] is a 74 yo male with PMH chronic A. fib on Eliquis, multiple joints OA, chronic bronchitis, morbid obesity, hypertension, gout who presented with SOB and swelling.  Patient has chronic A. fib has been following with cardiologist Dr. Claiborne Billings, most recent visit was January this year. No history of CHF documented. And according to patient's wife, patient'sA. fib has been fairly controlled. Recently patient has had worsening of back pain for which he has been following with pain management doctor. No new medication recent months.  Patient started to have increasing swelling on both legs 3 to 4 weeks ago, along with increasing shortness of breath initially was exertional and became worse this week. Wife also noticed patient has had increasing cough and wheezing since last week has been using more albuterol nebulizer, patient also has history of OSA not been compliant with CPAP. Denies any chest pain, no fever chills, no chest pain no urinary symptoms no diarrhea. Wife also noticed that the patient has been having "fingers nails and toes discoloration recently"  Chest x-ray showed cardiomegaly and pulmonary edema, patient was found to be hypoxic.Patient became hypoxic in the ED, very agitated remove oxygen and O2 sat ration dropped to upper 50s and then became  lethargic. He was eventually placed on BiPAP. He had hypercarbic respiratory failure on blood gas monitoring which improved with ongoing BiPAP therapy. He was also started on diuresis with Lasix given his volume overload. An echo was also ordered. EF 60-65%, Severe LVH.  Mildly reduced right ventricular systolic function. Pulmonology was consulted on admission as well. He will need outpatient formal PFTs. Prior to discharge he was able to be weaned off of oxygen to room air and ambulated well in the hallway with no desaturation events.  He was also arranged for home health as well as NIV but has a scheduled sleep study at time of discharge as well.   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 Needs to be adequately diuresed then obtain outpatient PFTs -Required continuous BiPAP on admission which has now been weaned off. Continue nightly BiPAP as well as as needed -Continue Lasix -Appreciate pulmonology evaluation -Discontinued doxycycline and steroids -Continue duo nebs for now  Acute CHF (congestive heart failure) ([place]) -Patient had clinical signs of volume overload on admission. BNP elevated, 261 -Echo also obtained on admission: EF 60 to 65%, severe LVH. Diastolic dysfunction could not be evaluated. RV systolic function mildly reduced. No RVH. - continue lasix  Acute metabolic encephalopathy-resolved as of 03/10/2020 -Likely due to hypercarbia on admission. This has improved -Continue monitoring for any further mentation changes  Physical deconditioning -We discussed his  current status at length bedside with him and his wife.  He is declining going to SNF/rehab at this time and wishes to go home with home health.  Wife also bedside for the conversation and in agreement and comfortable taking patient home.  Informed patient that he could change his  mind if he wanted rehab at any point.  For now, tentative plan will be for home with home health PT at discharge  Osteoarthritis - continue current pain regimen; caution with oversedation   Prediabetes -A1c 5.8% -Continue diet control  Atrial fibrillation (HCC) -Continue Coreg and Eliquis  Gout -Uric acid level 4.3. Continue to avoid HCTZ  Patient is a very pleasant 74 year old Caucasian male who is here today for hospital discharge follow-up.  He was admitted with pulmonary edema and hypercapnic hypoxic respiratory failure.  In addition to pulmonary edema from acute congestive heart failure, it was felt that he was likely having a COPD exacerbation as well.  His wife states that he had been wheezing for several weeks prior to his hospital admission.  He was also gaining weight prior to his hospital admission.  His wife states that he was up to 322 pounds on admission to the hospital.  I reviewed his echocardiogram.  Ejection fraction was 60 to 65%.  Study limitations prevented adequate assessment of his diastolic dysfunction.  I feel that the patient most likely has diastolic heart failure and possible even right-sided heart failure due to a combination of morbid obesity, untreated obstructive sleep apnea.  Patient is unable to tolerate his BiPAP at night due to claustrophobia.  He states that he is tried a number of different devices and has been unable to tolerate any of the mask.  Therefore his obstructive sleep apnea remains untreated.  I tried the patient on Breztri for suspected COPD.  However the patient due to cost it only been using it as needed.  He is not been on a daily long-acting bronchodilator prior to his hospital admission.  Today on examination he still has +1 pitting edema in his legs up to his mid shin however his lungs are clear to auscultation bilaterally except for a few scattered expiratory wheezes. At that time, my plan was: Patient has an appointment in January to see  the pulmonologist however I want to start him on a long-acting bronchodilator.  Due to the risk of pneumonia I will avoid an inhaled corticosteroid at the present time but I will start the patient on Anoro 1 inhalation daily.  I believe that untreated COPD is likely contributing to his diastolic dysfunction and possible right-sided heart failure.  Also explained to the patient that untreated sleep apnea and morbid obesity both contribute as well.  Therefore I encouraged the patient to try to lose weight by any means necessary especially caloric restriction.  I would also encourage him to try again to treat the obstructive sleep apnea with BiPAP.  Blood pressure is currently well controlled.  I will check a BMP and a CBC to monitor his electrolytes and creatinine on 80 mg of Lasix.  I believe he has virtually euvolemic and therefore I would not change his Lasix dose unless his electrolytes are abnormal.  03/31/20 Wt Readings from Last 3 Encounters:  03/31/20 (!) 311 lb (141.1 kg)  03/24/20 (!) 308 lb (139.7 kg)  03/12/20 (!) 304 lb 6.4 oz (138.1 kg)   Patient has gained 3 pounds since I last saw him. He does have trace bipedal edema to his mid shin  however is not impressive. He denies any shortness of breath beyond his baseline. His biggest concern right now is severe pain. He reports severe pain in his left shoulder and in his left hand. There is visible swelling at the base of the first MCP joint as well as at the first [facility] joint. It hurts simply for me to touch that area with my hand. He has severe pain with range of motion in his left shoulder. He also has pain in his right wrist. He complains of pain in both hip joints and in his butt muscles bilaterally. He is hurting all over his body and he reports that the pain is a 9 on a scale of 1-10. He is having a difficult time standing and he reports weakness in his legs. Pain is out of proportion to exam and certainly raises the concern for an autoimmune  process such as PMR or rheumatoid arthritis Past Medical History:  Diagnosis Date  . Atrial fibrillation ([place])   . Bronchitis    hx  . Cataract    right eye  . CHF NYHA class III, acute on chronic, diastolic ([place])   . COPD (chronic obstructive pulmonary disease) ([place])   . DDD (degenerative disc disease), lumbosacral   . Dysrhythmia    irregular heatbeat  . Eye worm    right eye  . Gout   . History of kidney stones   . Hypertension   . Left knee DJD   . Morbid obesity with BMI of 40.0-44.9, adult ([place])   . Pneumonia    hx  . Prediabetes   . Radicular syndrome of left leg   . S/P total knee replacement    right  . Sleep apnea    can't use sleep study 10 yrs ago  . 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    Past Surgical History:  Procedure Laterality Date  . CARDIOVERSION N/A 03/04/2015   Procedure: CARDIOVERSION;  Surgeon: [name], MD;  Location: [facility];  Service: Cardiovascular;  Laterality: N/A;  . CHOLECYSTECTOMY    . COLONOSCOPY N/A 01/01/2016   Procedure: COLONOSCOPY;  Surgeon: [name], MD;  [facility];  Service: Endoscopy;  Laterality: N/A;  1:45  . COLONOSCOPY N/A 01/14/2017   Procedure: COLONOSCOPY;  Surgeon: [name], MD;  [facility];  Service: Endoscopy;  Laterality: N/A;  12:05  . EYE EXAMINATION UNDER ANESTHESIA W/ RETINAL CRYOTHERAPY AND RETINAL LASER  07/2011  . EYE SURGERY  02/2011   cat rt  . KNEE ARTHROSCOPY     bilateral  . LUMBAR LAMINECTOMY/DECOMPRESSION MICRODISCECTOMY N/A 08/18/2017   Procedure: Bilateral microlumbar decompression Lumbar four-Lumbar five;  Surgeon: [name], MD;  Location: [facility];  Service: Orthopedics;  Laterality: N/A;  . POLYPECTOMY  01/01/2016   Procedure: POLYPECTOMY;  Surgeon: [name], MD;  [facility];  Service: Endoscopy;;  colon  . RETINAL DETACHMENT SURGERY  03/2011   rt  . TOTAL KNEE ARTHROPLASTY  2003   rt  . TOTAL KNEE ARTHROPLASTY  02/07/2012    Procedure: TOTAL KNEE ARTHROPLASTY;  Surgeon: [name], MD;  Location: [facility];  Service: Orthopedics;  Laterality: Left;   Current Outpatient Medications on File Prior to Visit  Medication Sig Dispense Refill  . allopurinol (ZYLOPRIM) 300 MG tablet TAKE 1 TABLET (300 MG TOTAL) BY MOUTH DAILY. 90 tablet 3  . apixaban (ELIQUIS) 5 MG TABS tablet Take 1 tablet (5 mg total) by mouth 2 (two)  times daily. 180 tablet 1  . carvedilol (COREG) 12.5 MG tablet TAKE 1 & 1/2 TABLETS BY MOUTH EVERY MORNING AND 1 TABLET IN THE EVENING (Patient taking differently: Take 12.5-18.75 mg by mouth See admin instructions. TAKE 18.75mg  BY MOUTH EVERY MORNING AND 12.5 IN THE EVENING) 225 tablet 3  . fluticasone (FLONASE) 50 MCG/ACT nasal spray SHAKE LIQUID AND USE 2 SPRAYS IN EACH NOSTRIL DAILY (Patient taking differently: Place 2 sprays into both nostrils daily as needed for allergies or rhinitis.) 16 g 6  . furosemide (LASIX) 80 MG tablet Take 1 tablet (80 mg total) by mouth daily. 30 tablet 3  . gabapentin (NEURONTIN) 300 MG capsule Take 600 mg by mouth at bedtime.    [name] HYDROcodone-acetaminophen (NORCO) 10-325 MG tablet Take 1 tablet by mouth 3 (three) times daily as needed for pain.    . Multiple Vitamins-Minerals (MULTIVITAMIN WITH MINERALS) tablet Take 1 tablet by mouth daily. 120 tablet 2  . potassium chloride SA (KLOR-CON) 20 MEQ tablet Take 1 tablet (20 mEq total) by mouth daily. 30 tablet 3  . temazepam (RESTORIL) 30 MG capsule TAKE 1 CAPSULE BY MOUTH AT BEDTIME AS NEEDED FOR SLEEP. (Patient taking differently: Take 30 mg by mouth at bedtime.) 30 capsule 2  . umeclidinium-vilanterol (ANORO ELLIPTA) 62.5-25 MCG/INH AEPB Inhale 1 puff into the lungs daily. 1 each 11   No current facility-administered medications on file prior to visit.   No Known Allergies Social History   Socioeconomic History  . Marital status: Married    Spouse name: Not on file  . Number of children: Not on file  . Years of  education: Not on file  . Highest education level: Not on file  Occupational History  . Not on file  Tobacco Use  . Smoking status: Former Smoker    Packs/day: 2.00    Years: 15.00    Pack years: 30.00    Types: Cigarettes    Quit date: 02/02/1992    Years since quitting: 28.1  . Smokeless tobacco: Never Used  . Tobacco comment: occ alcohol  Vaping Use  . Vaping Use: Never used  Substance and Sexual Activity  . Alcohol use: Yes    Alcohol/week: 0.0 standard drinks    Comment: occasional  . Drug use: No  . Sexual activity: Not on file  Other Topics Concern  . Not on file  Social History Narrative  . Not on file   Social Determinants of Health   Financial Resource Strain: Low Risk   . Difficulty of Paying Living Expenses: Not very hard  Food Insecurity: Not on file  Transportation Needs: Not on file  Physical Activity: Not on file  Stress: Not on file  Social Connections: Not on file  Intimate Partner Violence: Not on file      Review of Systems  All other systems reviewed and are negative.      Objective:   Physical Exam Vitals reviewed.  Constitutional:      General: He is not in acute distress.    Appearance: He is well-developed. He is not diaphoretic.  HENT:     Right Ear: External ear normal.     Left Ear: External ear normal.     Nose: Nose normal.     Mouth/Throat:     Pharynx: No oropharyngeal exudate.  Neck:     Thyroid: No thyromegaly.     Vascular: No JVD.     Trachea: No tracheal deviation.  Cardiovascular:  Rate and Rhythm: Normal rate. Rhythm irregularly irregular.     Heart sounds: Normal heart sounds.  Pulmonary:     Effort: Pulmonary effort is normal.     Breath sounds: Wheezing present. No rhonchi or rales.  Chest:     Chest wall: No tenderness.  Abdominal:     General: Bowel sounds are normal. There is distension.     Palpations: Abdomen is soft. There is no mass.     Tenderness: There is no abdominal tenderness. There is  no guarding or rebound.  Musculoskeletal:     Right shoulder: Tenderness present. Decreased range of motion.     Left shoulder: Tenderness present. Decreased range of motion.     Left wrist: Swelling, tenderness and bony tenderness present. Decreased range of motion.     Cervical back: Neck supple.     Lumbar back: Tenderness present. No deformity. Decreased range of motion.     Right hip: Tenderness present. No bony tenderness. Decreased range of motion.     Left hip: Tenderness present. Decreased range of motion.     Right lower leg: Edema present.     Left lower leg: Edema present.       Legs:  Lymphadenopathy:     Cervical: No cervical adenopathy.  Skin:    Coloration: Skin is not pale.     Findings: No erythema or rash.  Neurological:     Mental Status: He is alert and oriented to person, place, and time.     Cranial Nerves: No cranial nerve deficit.     Motor: No abnormal muscle tone.     Coordination: Coordination normal.     Deep Tendon Reflexes: Reflexes normal.  Psychiatric:        Behavior: Behavior normal.        Thought Content: Thought content normal.        Judgment: Judgment normal.           Assessment & Plan:  Polyarthralgia - Plan: CBC with Differential/Platelet, COMPLETE METABOLIC PANEL WITH GFR, Sedimentation rate, Rheumatoid factor, predniSONE (DELTASONE) 20 MG tablet  Given the severity of the pain in the number of joints involved, I'm concerned about an autoimmune disease such as PMR or rheumatoid arthritis. Check sed rate, rheumatoid factor, CBC, CMP. Start the patient on prednisone 20 mg a day as a suspect PMR. Recheck the patient on Thursday to see if he is improving. If he is improving dramatically, I would recommend a referral to rheumatology for other long-term options besides prednisone. At the present time continue Lasix 80 mg a day as the patient does not appear fluid overloaded

## 2020-04-01 LAB — CBC WITH DIFFERENTIAL/PLATELET
Absolute Monocytes: 611 cells/uL (ref 200–950)
Basophils Absolute: 28 cells/uL (ref 0–200)
Basophils Relative: 0.4 %
Eosinophils Absolute: 369 cells/uL (ref 15–500)
Eosinophils Relative: 5.2 %
HCT: 41.7 % (ref 38.5–50.0)
Hemoglobin: 14.2 g/dL (ref 13.2–17.1)
Lymphs Abs: 1093 cells/uL (ref 850–3900)
MCH: 31.3 pg (ref 27.0–33.0)
MCHC: 34.1 g/dL (ref 32.0–36.0)
MCV: 92.1 fL (ref 80.0–100.0)
MPV: 11 fL (ref 7.5–12.5)
Monocytes Relative: 8.6 %
Neutro Abs: 4998 cells/uL (ref 1500–7800)
Neutrophils Relative %: 70.4 %
Platelets: 195 10*3/uL (ref 140–400)
RBC: 4.53 10*6/uL (ref 4.20–5.80)
RDW: 12.9 % (ref 11.0–15.0)
Total Lymphocyte: 15.4 %
WBC: 7.1 10*3/uL (ref 3.8–10.8)

## 2020-04-01 LAB — COMPLETE METABOLIC PANEL WITH GFR
AG Ratio: 1.2 (calc) (ref 1.0–2.5)
ALT: 12 U/L (ref 9–46)
AST: 15 U/L (ref 10–35)
Albumin: 3.3 g/dL — ABNORMAL LOW (ref 3.6–5.1)
Alkaline phosphatase (APISO): 56 U/L (ref 35–144)
BUN: 14 mg/dL (ref 7–25)
CO2: 31 mmol/L (ref 20–32)
Calcium: 8.7 mg/dL (ref 8.6–10.3)
Chloride: 94 mmol/L — ABNORMAL LOW (ref 98–110)
Creat: 0.77 mg/dL (ref 0.70–1.18)
GFR, Est African American: 102 mL/min/{1.73_m2} (ref >=60)
GFR, Est Non African American: 88 mL/min/{1.73_m2} (ref >=60)
Globulin: 2.8 g/dL (calc) (ref 1.9–3.7)
Glucose, Bld: 104 mg/dL — ABNORMAL HIGH (ref 65–99)
Potassium: 3.5 mmol/L (ref 3.5–5.3)
Sodium: 136 mmol/L (ref 135–146)
Total Bilirubin: 0.9 mg/dL (ref 0.2–1.2)
Total Protein: 6.1 g/dL (ref 6.1–8.1)

## 2020-04-01 LAB — RHEUMATOID FACTOR: Rheumatoid fact SerPl-aCnc: <14 IU/mL (ref <14)

## 2020-04-01 LAB — SEDIMENTATION RATE: Sed Rate: 62 mm/h — ABNORMAL HIGH (ref 0–20)

## 2020-04-03 ENCOUNTER — Other Ambulatory Visit: Payer: Self-pay

## 2020-04-03 ENCOUNTER — Ambulatory Visit (INDEPENDENT_AMBULATORY_CARE_PROVIDER_SITE_OTHER): Payer: PPO | Admitting: Family Medicine

## 2020-04-03 ENCOUNTER — Other Ambulatory Visit: Payer: Self-pay | Admitting: Family Medicine

## 2020-04-03 ENCOUNTER — Other Ambulatory Visit: Payer: Self-pay | Admitting: Cardiovascular Disease

## 2020-04-03 ENCOUNTER — Ambulatory Visit: Payer: PPO | Admitting: Family Medicine

## 2020-04-03 VITALS — BP 120/90 | HR 101 | Temp 97.4°F | Ht 71.0 in | Wt 311.0 lb

## 2020-04-03 DIAGNOSIS — M353 Polymyalgia rheumatica: Secondary | ICD-10-CM

## 2020-04-03 DIAGNOSIS — M2550 Pain in unspecified joint: Secondary | ICD-10-CM | POA: Diagnosis not present

## 2020-04-03 MED ORDER — PREDNISONE 5 MG PO TABS: 5.0000 mg | ORAL_TABLET | Freq: Every day | ORAL | 1 refills | Status: DC

## 2020-04-03 MED FILL — CARVEDILOL 12.5 MG TABLET: 12.5 | 90 days supply | Qty: 225 | Fill #0

## 2020-04-03 MED FILL — predniSONE 5 MG TABS: 5 | 30 days supply | Qty: 30 | Fill #0

## 2020-04-03 MED FILL — ALLOPURINOL 300 MG TABS: 300 | 90 days supply | Qty: 90 | Fill #1

## 2020-04-03 NOTE — Telephone Encounter (Signed)
Ok to refill??  Last office visit 04/03/2020.  Last refill 12/31/2019, #2 refills.

## 2020-04-03 NOTE — Progress Notes (Signed)
Subjective:    Garrett Munoz ID: PHU RECORD, male    DOB: 07-11-1945, 74 y.o.   MRN: [id number]  HPI Admit date: 03/09/2020 Discharge date: 03/13/2020  Admitted From: home Disposition:  home Discharging physician: Dwyane Dee, MD  Recommendations for Outpatient Follow-up:  1. Outpt sleep study 2. Repeat BMP given d/c with lasix   Garrett Munoz discharged to home in Discharge Condition: stable CODE STATUS: Full Diet recommendation:     Diet Orders (From admission, onward)           Start     Ordered    03/12/20 0000  Diet - low sodium heart healthy        03/12/20 1137           Hospital Course: Garrett Munoz is a 74 yo male with PMH chronic A. fib on Eliquis, multiple joints OA, chronic bronchitis, morbid obesity, hypertension, gout who presented with SOB and swelling.  Garrett Munoz has chronic A. fib has been following with cardiologist Dr. Claiborne Billings, most recent visit was January this year. No history of CHF documented. And according to Garrett Munoz's wife, Garrett Munoz'sA. fib has been fairly controlled. Recently Garrett Munoz has had worsening of back pain for which he has been following with pain management doctor. No new medication recent months.  Garrett Munoz started to have increasing swelling on both legs 3 to 4 weeks ago, along with increasing shortness of breath initially was exertional and became worse this week. Wife also noticed Garrett Munoz has had increasing cough and wheezing since last week has been using more albuterol nebulizer, Garrett Munoz also has history of OSA not been compliant with CPAP. Denies any chest pain, no fever chills, no chest pain no urinary symptoms no diarrhea. Wife also noticed that the Garrett Munoz has been having "fingers nails and toes discoloration recently"  Chest x-ray showed cardiomegaly and pulmonary edema, Garrett Munoz was found to be hypoxic.Garrett Munoz became hypoxic in the ED, very agitated remove oxygen and O2 sat ration dropped to upper 50s and then became  lethargic. He was eventually placed on BiPAP. He had hypercarbic respiratory failure on blood gas monitoring which improved with ongoing BiPAP therapy. He was also started on diuresis with Lasix given his volume overload. An echo was also ordered. EF 60-65%, Severe LVH.  Mildly reduced right ventricular systolic function. Pulmonology was consulted on admission as well. He will need outpatient formal PFTs. Prior to discharge he was able to be weaned off of oxygen to room air and ambulated well in the hallway with no desaturation events.  He was also arranged for home health as well as NIV but has a scheduled sleep study at time of discharge as well.   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 Needs to be adequately diuresed then obtain outpatient PFTs -Required continuous BiPAP on admission which has now been weaned off. Continue nightly BiPAP as well as as needed -Continue Lasix -Appreciate pulmonology evaluation -Discontinued doxycycline and steroids -Continue duo nebs for now  Acute CHF (congestive heart failure) ([place]) -Garrett Munoz had clinical signs of volume overload on admission. BNP elevated, 261 -Echo also obtained on admission: EF 60 to 65%, severe LVH. Diastolic dysfunction could not be evaluated. RV systolic function mildly reduced. No RVH. - continue lasix  Acute metabolic encephalopathy-resolved as of 03/10/2020 -Likely due to hypercarbia on admission. This has improved -Continue monitoring for any further mentation changes  Physical deconditioning -We discussed his  current status at length bedside with him and his wife.  He is declining going to SNF/rehab at this time and wishes to go home with home health.  Wife also bedside for the conversation and in agreement and comfortable taking Garrett Munoz home.  Informed Garrett Munoz that he could change his  mind if he wanted rehab at any point.  For now, tentative plan will be for home with home health PT at discharge  Osteoarthritis - continue current pain regimen; caution with oversedation   Prediabetes -A1c 5.8% -Continue diet control  Atrial fibrillation (HCC) -Continue Coreg and Eliquis  Gout -Uric acid level 4.3. Continue to avoid HCTZ  Garrett Munoz is a very pleasant 74 year old Caucasian male who is here today for hospital discharge follow-up.  He was admitted with pulmonary edema and hypercapnic hypoxic respiratory failure.  In addition to pulmonary edema from acute congestive heart failure, it was felt that he was likely having a COPD exacerbation as well.  His wife states that he had been wheezing for several weeks prior to his hospital admission.  He was also gaining weight prior to his hospital admission.  His wife states that he was up to 322 pounds on admission to the hospital.  I reviewed his echocardiogram.  Ejection fraction was 60 to 65%.  Study limitations prevented adequate assessment of his diastolic dysfunction.  I feel that the Garrett Munoz most likely has diastolic heart failure and possible even right-sided heart failure due to a combination of morbid obesity, untreated obstructive sleep apnea.  Garrett Munoz is unable to tolerate his BiPAP at night due to claustrophobia.  He states that he is tried a number of different devices and has been unable to tolerate any of the mask.  Therefore his obstructive sleep apnea remains untreated.  I tried the Garrett Munoz on Breztri for suspected COPD.  However the Garrett Munoz due to cost it only been using it as needed.  He is not been on a daily long-acting bronchodilator prior to his hospital admission.  Today on examination he still has +1 pitting edema in his legs up to his mid shin however his lungs are clear to auscultation bilaterally except for a few scattered expiratory wheezes. At that time, my plan was: Garrett Munoz has an appointment in January to see  the pulmonologist however I want to start him on a long-acting bronchodilator.  Due to the risk of pneumonia I will avoid an inhaled corticosteroid at the present time but I will start the Garrett Munoz on Anoro 1 inhalation daily.  I believe that untreated COPD is likely contributing to his diastolic dysfunction and possible right-sided heart failure.  Also explained to the Garrett Munoz that untreated sleep apnea and morbid obesity both contribute as well.  Therefore I encouraged the Garrett Munoz to try to lose weight by any means necessary especially caloric restriction.  I would also encourage him to try again to treat the obstructive sleep apnea with BiPAP.  Blood pressure is currently well controlled.  I will check a BMP and a CBC to monitor his electrolytes and creatinine on 80 mg of Lasix.  I believe he has virtually euvolemic and therefore I would not change his Lasix dose unless his electrolytes are abnormal.  03/31/20 Wt Readings from Last 3 Encounters:  04/03/20 (!) 311 lb (141.1 kg)  03/31/20 (!) 311 lb (141.1 kg)  03/24/20 (!) 308 lb (139.7 kg)   Garrett Munoz has gained 3 pounds since I last saw him. He does have trace bipedal edema to his mid shin however is  not impressive. He denies any shortness of breath beyond his baseline. His biggest concern right now is severe pain. He reports severe pain in his left shoulder and in his left hand. There is visible swelling at the base of the first MCP joint as well as at the first [facility] joint. It hurts simply for me to touch that area with my hand. He has severe pain with range of motion in his left shoulder. He also has pain in his right wrist. He complains of pain in both hip joints and in his butt muscles bilaterally. He is hurting all over his body and he reports that the pain is a 9 on a scale of 1-10. He is having a difficult time standing and he reports weakness in his legs. Pain is out of proportion to exam and certainly raises the concern for an autoimmune process  such as PMR or rheumatoid arthritis.  At that time, my plan was: Given the severity of the pain in the number of joints involved, I'm concerned about an autoimmune disease such as PMR or rheumatoid arthritis. Check sed rate, rheumatoid factor, CBC, CMP. Start the Garrett Munoz on prednisone 20 mg a day as a suspect PMR. Recheck the Garrett Munoz on Thursday to see if he is improving. If he is improving dramatically, I would recommend a referral to rheumatology for other long-term options besides prednisone. At the present time continue Lasix 80 mg a day as the Garrett Munoz does not appear fluid overloaded  04/03/20 Garrett Munoz has responded dramatically to 20 mg a day of prednisone.  He states that he is more than 80% better.  The swellings in both hands have dramatically subsided.  The pain in his shoulders is essentially gone away.  He continues to have some soreness in his bilateral hips and gluteus area but this even is much better.  In fact as I was walking to the exam room, the Garrett Munoz was walking to go to the bathroom.  3 days ago he cannot even hardly stand.  Therefore I definitely believe that were dealing with an autoimmune process rather than just "simple arthritis".  His sed rate was also elevated to greater than 60 although given his age this is difficult to interpret.   Past Medical History:  Diagnosis Date  . Atrial fibrillation ([place])   . Bronchitis    hx  . Cataract    right eye  . CHF NYHA class III, acute on chronic, diastolic ([place])   . COPD (chronic obstructive pulmonary disease) ([place])   . DDD (degenerative disc disease), lumbosacral   . Dysrhythmia    irregular heatbeat  . Eye worm    right eye  . Gout   . History of kidney stones   . Hypertension   . Left knee DJD   . Morbid obesity with BMI of 40.0-44.9, adult ([place])   . Pneumonia    hx  . Prediabetes   . Radicular syndrome of left leg   . S/P total knee replacement    right  . Sleep apnea    can't use sleep study 10 yrs ago  .  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    Past Surgical History:  Procedure Laterality Date  . CARDIOVERSION N/A 03/04/2015   Procedure: CARDIOVERSION;  Surgeon: [name], MD;  Location: [facility];  Service: Cardiovascular;  Laterality: N/A;  . CHOLECYSTECTOMY    . COLONOSCOPY N/A 01/01/2016   Procedure: COLONOSCOPY;  Surgeon: [name], MD;  [facility];  Service: Endoscopy;  Laterality: N/A;  1:45  . COLONOSCOPY N/A 01/14/2017   Procedure: COLONOSCOPY;  Surgeon: [name], MD;  [facility];  Service: Endoscopy;  Laterality: N/A;  12:05  . EYE EXAMINATION UNDER ANESTHESIA W/ RETINAL CRYOTHERAPY AND RETINAL LASER  07/2011  . EYE SURGERY  02/2011   cat rt  . KNEE ARTHROSCOPY     bilateral  . LUMBAR LAMINECTOMY/DECOMPRESSION MICRODISCECTOMY N/A 08/18/2017   Procedure: Bilateral microlumbar decompression Lumbar four-Lumbar five;  Surgeon: [name], MD;  Location: [facility];  Service: Orthopedics;  Laterality: N/A;  . POLYPECTOMY  01/01/2016   Procedure: POLYPECTOMY;  Surgeon: [name], MD;  [facility];  Service: Endoscopy;;  colon  . RETINAL DETACHMENT SURGERY  03/2011   rt  . TOTAL KNEE ARTHROPLASTY  2003   rt  . TOTAL KNEE ARTHROPLASTY  02/07/2012   Procedure: TOTAL KNEE ARTHROPLASTY;  Surgeon: [name], MD;  Location: [facility];  Service: Orthopedics;  Laterality: Left;   Current Outpatient Medications on File Prior to Visit  Medication Sig Dispense Refill  . allopurinol (ZYLOPRIM) 300 MG tablet TAKE 1 TABLET (300 MG TOTAL) BY MOUTH DAILY. 90 tablet 3  . apixaban (ELIQUIS) 5 MG TABS tablet Take 1 tablet (5 mg total) by mouth 2 (two) times daily. 180 tablet 1  . fluticasone (FLONASE) 50 MCG/ACT nasal spray SHAKE LIQUID AND USE 2 SPRAYS IN EACH NOSTRIL DAILY (Garrett Munoz taking differently: Place 2 sprays into both nostrils daily as needed for allergies or rhinitis.) 16 g 6  . furosemide (LASIX) 80 MG tablet Take 1 tablet (80 mg total)  by mouth daily. 30 tablet 3  . gabapentin (NEURONTIN) 300 MG capsule Take 600 mg by mouth at bedtime.    [name] HYDROcodone-acetaminophen (NORCO) 10-325 MG tablet Take 1 tablet by mouth 3 (three) times daily as needed for pain.    . Multiple Vitamins-Minerals (MULTIVITAMIN WITH MINERALS) tablet Take 1 tablet by mouth daily. 120 tablet 2  . potassium chloride SA (KLOR-CON) 20 MEQ tablet Take 1 tablet (20 mEq total) by mouth daily. 30 tablet 3  . predniSONE (DELTASONE) 20 MG tablet Take 1 tablet (20 mg total) by mouth daily with breakfast. 30 tablet 0  . temazepam (RESTORIL) 30 MG capsule TAKE 1 CAPSULE BY MOUTH AT BEDTIME AS NEEDED FOR SLEEP. (Garrett Munoz taking differently: Take 30 mg by mouth at bedtime.) 30 capsule 2  . umeclidinium-vilanterol (ANORO ELLIPTA) 62.5-25 MCG/INH AEPB Inhale 1 puff into the lungs daily. 1 each 11   No current facility-administered medications on file prior to visit.   No Known Allergies Social History   Socioeconomic History  . Marital status: Married    Spouse name: Not on file  . Number of children: Not on file  . Years of education: Not on file  . Highest education level: Not on file  Occupational History  . Not on file  Tobacco Use  . Smoking status: Former Smoker    Packs/day: 2.00    Years: 15.00    Pack years: 30.00    Types: Cigarettes    Quit date: 02/02/1992    Years since quitting: 28.1  . Smokeless tobacco: Never Used  . Tobacco comment: occ alcohol  Vaping Use  . Vaping Use: Never used  Substance and Sexual Activity  . Alcohol use: Yes    Alcohol/week: 0.0 standard drinks    Comment: occasional  . Drug use: No  . Sexual activity: Not on file  Other  Topics Concern  . Not on file  Social History Narrative  . Not on file   Social Determinants of Health   Financial Resource Strain: Low Risk   . Difficulty of Paying Living Expenses: Not very hard  Food Insecurity: Not on file  Transportation Needs: Not on file  Physical Activity: Not on  file  Stress: Not on file  Social Connections: Not on file  Intimate Partner Violence: Not on file      Review of Systems  All other systems reviewed and are negative.      Objective:   Physical Exam Vitals reviewed.  Constitutional:      General: He is not in acute distress.    Appearance: He is well-developed. He is not diaphoretic.  HENT:     Right Ear: External ear normal.     Left Ear: External ear normal.     Nose: Nose normal.     Mouth/Throat:     Pharynx: No oropharyngeal exudate.  Neck:     Thyroid: No thyromegaly.     Vascular: No JVD.     Trachea: No tracheal deviation.  Cardiovascular:     Rate and Rhythm: Normal rate. Rhythm irregularly irregular.     Heart sounds: Normal heart sounds.  Pulmonary:     Effort: Pulmonary effort is normal.     Breath sounds: No wheezing, rhonchi or rales.  Chest:     Chest wall: No tenderness.  Abdominal:     General: Bowel sounds are normal. There is distension.     Palpations: Abdomen is soft. There is no mass.     Tenderness: There is no abdominal tenderness. There is no guarding or rebound.  Musculoskeletal:     Right shoulder: No tenderness. Normal range of motion.     Left shoulder: No tenderness. Normal range of motion.     Left wrist: No swelling, tenderness or bony tenderness. Normal range of motion.     Cervical back: Neck supple.     Lumbar back: Tenderness present. No deformity. Decreased range of motion.     Right hip: No tenderness or bony tenderness. Normal range of motion.     Left hip: No tenderness. Normal range of motion.     Right lower leg: No edema.     Left lower leg: No edema.  Lymphadenopathy:     Cervical: No cervical adenopathy.  Skin:    Coloration: Skin is not pale.     Findings: No erythema or rash.  Neurological:     Mental Status: He is alert and oriented to person, place, and time.     Cranial Nerves: No cranial nerve deficit.     Motor: No abnormal muscle tone.     Coordination:  Coordination normal.     Deep Tendon Reflexes: Reflexes normal.  Psychiatric:        Behavior: Behavior normal.        Thought Content: Thought content normal.        Judgment: Judgment normal.           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  Decrease prednisone to 15 mg a day for 1 week then decrease to 12.5 mg of prednisone per day  for 2 weeks then decrease to 10 mg of prednisone today if the Garrett Munoz will tolerate.  In the meantime arrange second opinion with rheumatology as I want to get the Garrett Munoz off prednisone as quickly as possible

## 2020-04-04 ENCOUNTER — Other Ambulatory Visit: Payer: Self-pay | Admitting: Family Medicine

## 2020-04-04 MED FILL — HYDROCODON-APAP 10-325: 10-325 | 10 days supply | Qty: 30 | Fill #0

## 2020-04-04 MED FILL — FUROSEMIDE 80 MG TAB: 80 | 30 days supply | Qty: 30 | Fill #1

## 2020-04-04 MED FILL — TEMAZEPAM 30 MG CAPSULE: 30 | 30 days supply | Qty: 30 | Fill #0

## 2020-04-09 NOTE — Progress Notes (Signed)
 Office Visit Note  Patient: Garrett Munoz             Date of Birth: 02/22/1946           MRN: [id number]             PCP: Pickard, Warren T, MD Referring: Pickard, Warren T, MD Visit Date: 04/10/2020   Subjective:  New Patient (Initial Visit) and Joint Pain (Left leg; bilateral hips; back; bilateral hands)   History of Present Illness: Garrett Munoz is a 74 y.o. 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 He has a history of joint pain in multiple sites since decades ago with both gout that has involved numerous joints and also osteoarthritis s/p lumbar spine surgery and left TKA. He was hospitalized last month due to acute on chronic respiratory failure related to heart failure exacerbation. After this hospitalization he developed worsening pain over numerous areas especially the shoulders, hands, back, hips. He was seen in PCP office with labs demonstrating increased sedimentation rate of 62 with negative rheumatoid factor and normal uric acid. He started prednisone 20mg daily with a very large improvement in symptoms. He has decreased to 15mg at this time apparently some increase in symptoms tapering this but mostly still improved. He denies new unilateral headache or vision change. He does have very interrupted sleep and excessive daytime somnolence. This is attributed to not tolerating a facial mask for OSA and frequent nocturia with diuretics.  Labs reviewed 03/31/2020 ESR 62 RF negative   Activities of Daily Living:  Patient reports morning stiffness for 1 hour.   Patient Reports nocturnal pain.  Difficulty dressing/grooming: Denies Difficulty climbing stairs: Denies Difficulty getting out of chair: Denies Difficulty using hands for taps, buttons, cutlery, and/or writing: Reports  Review of Systems   Constitutional: Positive for fatigue.  HENT: Positive for mouth dryness and nose dryness. Negative for mouth sores.   Eyes: Positive for dryness. Negative for pain, itching and visual disturbance.  Respiratory: Positive for cough. Negative for hemoptysis, shortness of breath and difficulty breathing.   Cardiovascular: Negative for chest pain, palpitations and swelling in legs/feet.  Gastrointestinal: Negative for abdominal pain, blood in stool, constipation and diarrhea.  Endocrine: Negative for increased urination.  Genitourinary: Negative for painful urination.  Musculoskeletal: Positive for arthralgias, joint pain, myalgias, muscle weakness, morning stiffness and myalgias. Negative for joint swelling and muscle tenderness.  Skin: Negative for color change, rash and redness.  Allergic/Immunologic: Negative for susceptible to infections.  Neurological: Negative for dizziness, numbness, headaches and memory loss.  Hematological: Negative for swollen glands.  Psychiatric/Behavioral: Positive for sleep disturbance. Negative for confusion.    PMFS History:  Patient Active Problem List   Diagnosis Date Noted  . Polymyalgia rheumatica (HCC) 04/10/2020  . 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  . High risk medication use 04/10/2020  . CHF NYHA class III, acute on chronic, diastolic (HCC)   . Physical deconditioning 03/11/2020  . Prediabetes 03/10/2020  . Acute CHF (congestive heart failure) (HCC) 03/09/2020  . Acute respiratory failure with hypoxia and hypercapnia (HCC)   . Irritable bowel syndrome (IBS) 02/27/2019  . Spinal stenosis of lumbar region 08/18/2017  . Spinal stenosis at L4-L5 level 08/18/2017  . Guaiac positive stools 11/10/2015  . CAP (community acquired pneumonia) 04/10/2015  . Fatigue 04/10/2015  . Hypertension 04/10/2015  . HCAP (healthcare-associated pneumonia)   .   Persistent atrial fibrillation ([place])   . Anticoagulation adequate 02/21/2015  . OSA (obstructive sleep  apnea) 02/01/2015  . Atrial fibrillation ([place]) 02/01/2015  . History of gout 02/01/2015  . Morbid obesity ([place]) 02/01/2015  . Pain in left knee 02/24/2012  . Weakness of left leg 02/24/2012  . Gout   . Cataract   . Eye worm   . S/P total knee replacement   . DDD (degenerative disc disease), lumbosacral   . Radicular syndrome of left leg   . Left knee DJD     Past Medical History:  Diagnosis Date  . Atrial fibrillation ([place])   . Bronchitis    hx  . Cataract    right eye  . CHF NYHA class III, acute on chronic, diastolic ([place])   . COPD (chronic obstructive pulmonary disease) ([place])   . DDD (degenerative disc disease), lumbosacral   . Dysrhythmia    irregular heatbeat  . Eye worm    right eye  . Gout   . History of kidney stones   . Hypertension   . Left knee DJD   . Morbid obesity with BMI of 40.0-44.9, adult ([place])   . Pneumonia    hx  . Prediabetes   . Radicular syndrome of left leg   . S/P total knee replacement    right  . Sleep apnea    can't use sleep study 10 yrs ago  . 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     Family History  Problem Relation Age of Onset  . Lung cancer Father    Past Surgical History:  Procedure Laterality Date  . CARDIOVERSION N/A 03/04/2015   Procedure: CARDIOVERSION;  Surgeon: [name], MD;  Location: [facility];  Service: Cardiovascular;  Laterality: N/A;  . CHOLECYSTECTOMY    . COLONOSCOPY N/A 01/01/2016   Procedure: COLONOSCOPY;  Surgeon: [name], MD;  [facility];  Service: Endoscopy;  Laterality: N/A;  1:45  . COLONOSCOPY N/A 01/14/2017   Procedure: COLONOSCOPY;  Surgeon: [name], MD;  [facility];  Service: Endoscopy;  Laterality: N/A;  12:05  . EYE EXAMINATION UNDER ANESTHESIA W/ RETINAL CRYOTHERAPY AND RETINAL LASER  07/2011  . EYE SURGERY  02/2011   cat rt  . KNEE ARTHROSCOPY     bilateral  . LUMBAR LAMINECTOMY/DECOMPRESSION MICRODISCECTOMY N/A 08/18/2017   Procedure: Bilateral  microlumbar decompression Lumbar four-Lumbar five;  Surgeon: [name], MD;  Location: [facility];  Service: Orthopedics;  Laterality: N/A;  . POLYPECTOMY  01/01/2016   Procedure: POLYPECTOMY;  Surgeon: [name], MD;  [facility];  Service: Endoscopy;;  colon  . RETINAL DETACHMENT SURGERY  03/2011   rt  . TOTAL KNEE ARTHROPLASTY  2003   rt  . TOTAL KNEE ARTHROPLASTY  02/07/2012   Procedure: TOTAL KNEE ARTHROPLASTY;  Surgeon: [name], MD;  Location: [facility];  Service: Orthopedics;  Laterality: Left;   Social History   Social History Narrative  . Not on file   Immunization History  Administered Date(s) Administered  . Fluad Quad(high Dose 65+) 12/20/2018, 01/09/2020  . Influenza, High Dose Seasonal PF 01/04/2017  . Influenza,inj,Quad PF,6+ Mos 01/04/2018  . Influenza-Unspecified 01/18/2016, 01/24/2017  . Moderna Sars-Covid-2 Vaccination 05/01/2019, 05/28/2019, 12/25/2019  . Pneumococcal Conjugate-13 01/04/2017, 02/17/2017  . Pneumococcal-Unspecified 02/17/2017  . Zoster Recombinat (Shingrix) 12/07/2017, 02/13/2018     Objective: Vital Signs: BP 132/79 (BP Location: Right Arm, Patient Position: Sitting, Cuff Size: Large)   Pulse (!) 52  Ht 5' 9" (1.753 m)   Wt (!) 310 lb (140.6 kg)   BMI 45.78 kg/m    Physical Exam Constitutional:      Appearance: He is obese.  Cardiovascular:     Rate and Rhythm: Normal rate. Rhythm irregular.  Pulmonary:     Effort: Pulmonary effort is normal.     Breath sounds: Normal breath sounds.  Skin:    General: Skin is warm and dry.     Findings: No rash.  Neurological:     General: No focal deficit present.     Mental Status: He is alert.     Comments: Hard of hearing  Psychiatric:        Mood and Affect: Mood normal.     Musculoskeletal Exam:  Shoulder ROM reduced in internal and external rotation while abducted, pain with resisted abduction and give way weakness b/l mild tenderness to palpation Elbow full  range of motion no tenderness or swelling Bilateral wrist tenderness to pressure worst on radial side and limited ROM of wrists especially extension ROM Tenderness over right 2-3rd MCPs with palpation and tight gripping, no swelling noted on palpation or ultrasound inspection Normal hip internal and external rotation Knees full range of motion no tenderness or swelling   Investigation: No additional findings.  Imaging: No results found.  Recent Labs: Lab Results  Component Value Date   WBC 7.1 03/31/2020   HGB 14.2 03/31/2020   PLT 195 03/31/2020   NA 136 03/31/2020   K 3.5 03/31/2020   CL 94 (L) 03/31/2020   CO2 31 03/31/2020   GLUCOSE 104 (H) 03/31/2020   BUN 14 03/31/2020   CREATININE 0.77 03/31/2020   BILITOT 0.9 03/31/2020   ALKPHOS 57 03/09/2020   AST 15 03/31/2020   ALT 12 03/31/2020   PROT 6.1 03/31/2020   ALBUMIN 3.0 (L) 03/09/2020   CALCIUM 8.7 03/31/2020   GFRAA 102 03/31/2020    Speciality Comments: No specialty comments available.  Procedures:  No procedures performed Allergies: Patient has no known allergies.   Assessment / Plan:     Visit Diagnoses: Polymyalgia rheumatica (HCC)  Generalized osteoarthritis- Plan: Sedimentation rate  It is hard to definitely attribute symptom exacerbation to PMR but the relatively rapid increase, elevated ESR, and large response to prednisone is suggestive. He has multiple comorbidities including heart failure, prediabetes, and hypertension would benefit with minimizing total steroid exposure. I agree with the recommended steroid tapering course from his PCP we will also plan to start low dose MTX as a steroid sparing DMARD this is associated with significantly greater success in prednisone tapering without symptom relapse. Checking ESR today it should be suppressed with steroid treatment  History of gout  I do not see evidence of inflammatory joint changes at this time and uric acid not significantly elevated.  High  risk medication use - Plan: Hepatitis B core antibody, IgM, Hepatitis B surface antigen, Hepatitis C antibody  Checking baseline viral hepatitis screening for planned MTX treatment start. He has had recent chest imaging with heart failure exacerbation no evidence of ILD or nodulosis. Information provided to patient will f/u by phone after results to start and needs f/u within 4 moths for repeat labs.  Orders: Orders Placed This Encounter  Procedures  . Hepatitis B core antibody, IgM  . Hepatitis B surface antigen  . Hepatitis C antibody  . Sedimentation rate   No orders of the defined types were placed in this encounter.   Follow-Up Instructions: Return in   about 5 weeks (around 05/15/2020) for Suspect PMR MTX start f/u.    W , MD  Note - This record has been created using Dragon software.  Chart creation errors have been sought, but may not always  have been located. Such creation errors do not reflect on  the standard of medical care.  

## 2020-04-10 ENCOUNTER — Encounter: Payer: Self-pay | Admitting: Internal Medicine

## 2020-04-10 ENCOUNTER — Other Ambulatory Visit: Payer: Self-pay

## 2020-04-10 ENCOUNTER — Ambulatory Visit: Payer: PPO | Admitting: Internal Medicine

## 2020-04-10 VITALS — BP 132/79 | HR 52 | Ht 69.0 in | Wt 310.0 lb

## 2020-04-10 DIAGNOSIS — M353 Polymyalgia rheumatica: Secondary | ICD-10-CM

## 2020-04-10 DIAGNOSIS — Z8739 Personal history of other diseases of the musculoskeletal system and connective tissue: Secondary | ICD-10-CM

## 2020-04-10 DIAGNOSIS — Z79899 Other long term (current) drug therapy: Secondary | ICD-10-CM | POA: Diagnosis not present

## 2020-04-10 DIAGNOSIS — M159 Polyosteoarthritis, unspecified: Secondary | ICD-10-CM | POA: Diagnosis not present

## 2020-04-10 DIAGNOSIS — R7303 Prediabetes: Secondary | ICD-10-CM | POA: Diagnosis not present

## 2020-04-10 NOTE — Patient Instructions (Signed)
Methotrexate tablets What is this medicine? METHOTREXATE (METH oh TREX ate) is a chemotherapy drug used to treat cancer including breast cancer, leukemia, and lymphoma. This medicine can also be used to treat psoriasis and certain kinds of arthritis. This medicine may be used for other purposes; ask your health care provider or pharmacist if you have questions. COMMON BRAND NAME(S): Rheumatrex, Trexall What should I tell my health care provider before I take this medicine? They need to know if you have any of these conditions:  fluid in the stomach area or lungs  if you often drink alcohol  infection or immune system problems  kidney disease or on hemodialysis  liver disease  low blood counts, like low white cell, platelet, or red cell counts  lung disease  radiation therapy  stomach ulcers  ulcerative colitis  an unusual or allergic reaction to methotrexate, other medicines, foods, dyes, or preservatives  pregnant or trying to get pregnant  breast-feeding How should I use this medicine? Take this medicine by mouth with a glass of water. Follow the directions on the prescription label. Take your medicine at regular intervals. Do not take it more often than directed. Do not stop taking except on your doctor's advice. Make sure you know why you are taking this medicine and how often you should take it. If this medicine is used for a condition that is not cancer, like arthritis or psoriasis, it should be taken weekly, NOT daily. Taking this medicine more often than directed can cause serious side effects, even death. Talk to your healthcare provider about safe handling and disposal of this medicine. You may need to take special precautions. Talk to your pediatrician regarding the use of this medicine in children. While this drug may be prescribed for selected conditions, precautions do apply. Overdosage: If you think you have taken too much of this medicine contact a poison  control center or emergency room at once. NOTE: This medicine is only for you. Do not share this medicine with others. What if I miss a dose? If you miss a dose, talk with your doctor or health care professional. Do not take double or extra doses. What may interact with this medicine? This medicine may interact with the following medication:  acitretin  aspirin and aspirin-like medicines including salicylates  azathioprine  certain antibiotics like penicillins, tetracycline, and chloramphenicol  cyclosporine  gold  hydroxychloroquine  live virus vaccines  NSAIDs, medicines for pain and inflammation, like ibuprofen or naproxen  other cytotoxic agents  penicillamine  phenylbutazone  phenytoin  probenecid  retinoids such as isotretinoin and tretinoin  steroid medicines like prednisone or cortisone  sulfonamides like sulfasalazine and trimethoprim/sulfamethoxazole  theophylline This list may not describe all possible interactions. Give your health care provider a list of all the medicines, herbs, non-prescription drugs, or dietary supplements you use. Also tell them if you smoke, drink alcohol, or use illegal drugs. Some items may interact with your medicine. What should I watch for while using this medicine? Avoid alcoholic drinks. This medicine can make you more sensitive to the sun. Keep out of the sun. If you cannot avoid being in the sun, wear protective clothing and use sunscreen. Do not use sun lamps or tanning beds/booths. You may need blood work done while you are taking this medicine. Call your doctor or health care professional for advice if you get a fever, chills or sore throat, or other symptoms of a cold or flu. Do not treat yourself. This drug decreases  your body's ability to fight infections. Try to avoid being around people who are sick. This medicine may increase your risk to bruise or bleed. Call your doctor or health care professional if you notice any  unusual bleeding. Check with your doctor or health care professional if you get an attack of severe diarrhea, nausea and vomiting, or if you sweat a lot. The loss of too much body fluid can make it dangerous for you to take this medicine. Talk to your doctor about your risk of cancer. You may be more at risk for certain types of cancers if you take this medicine. Both men and women must use effective birth control with this medicine. Do not become pregnant while taking this medicine or until at least 1 normal menstrual cycle has occurred after stopping it. Women should inform their doctor if they wish to become pregnant or think they might be pregnant. Men should not father a child while taking this medicine and for 3 months after stopping it. There is a potential for serious side effects to an unborn child. Talk to your health care professional or pharmacist for more information. Do not breast-feed an infant while taking this medicine. What side effects may I notice from receiving this medicine? Side effects that you should report to your doctor or health care professional as soon as possible:  allergic reactions like skin rash, itching or hives, swelling of the face, lips, or tongue  breathing problems or shortness of breath  diarrhea  dry, nonproductive cough  low blood counts - this medicine may decrease the number of white blood cells, red blood cells and platelets. You may be at increased risk for infections and bleeding.  mouth sores  redness, blistering, peeling or loosening of the skin, including inside the mouth  signs of infection - fever or chills, cough, sore throat, pain or trouble passing urine  signs and symptoms of bleeding such as bloody or black, tarry stools; red or dark-brown urine; spitting up blood or brown material that looks like coffee grounds; red spots on the skin; unusual bruising or bleeding from the eye, gums, or nose  signs and symptoms of kidney injury like  trouble passing urine or change in the amount of urine  signs and symptoms of liver injury like dark yellow or brown urine; general ill feeling or flu-like symptoms; light-colored stools; loss of appetite; nausea; right upper belly pain; unusually weak or tired; yellowing of the eyes or skin Side effects that usually do not require medical attention (report to your doctor or health care professional if they continue or are bothersome):  dizziness  hair loss  tiredness  upset stomach  vomiting This list may not describe all possible side effects. Call your doctor for medical advice about side effects. You may report side effects to FDA at 1-800-FDA-1088. Where should I keep my medicine? Keep out of the reach of children. Store at room temperature between 20 and 25 degrees C (68 and 77 degrees F). Protect from light. Throw away any unused medicine after the expiration date.    Polymyalgia Rheumatica Polymyalgia rheumatica (PMR) is an inflammatory disorder that causes the muscles and joints to ache and become stiff. Sometimes, PMR leads to a more dangerous condition that can cause vision loss (temporal arteritis or giant cell arteritis). What are the causes? The exact cause of PMR is not known. What increases the risk? You are more likely to develop this condition if you are:  Male.  50 years  of age or older.  Caucasian. What are the signs or symptoms? Pain and stiffness are the main symptoms of PMR. Symptoms may:  Be worse after inactivity and in the morning.  Affect your: ? Hips, buttocks, and thighs. ? Neck, arms, and shoulders. This can make it hard to raise your arms above your head. ? Hands and wrists. Other symptoms include:  Fever.  Tiredness.  Weakness.  Depression.  Decreased appetite. This may lead to weight loss. Symptoms may start slowly or suddenly. How is this diagnosed? This condition is diagnosed with your medical history and a physical exam. You  may need to see a health care provider who specializes in diseases of the joints, muscles, and bones (rheumatologist). You may also have tests, including:  Blood tests.  X-rays.  Ultrasound. How is this treated? PMR usually goes away without treatment, but it may take years. Your health care provider may recommend low-dose steroids and other medicines to help manage your symptoms of pain and stiffness. Regular exercise and rest will also help your symptoms. Follow these instructions at home:   Take over-the-counter and prescription medicines only as told by your health care provider.  Make sure to get enough rest and sleep.  Eat a healthy and nutritious diet.  Try to exercise most days of the week. Ask your health care provider what type of exercise is best for you.  Keep all follow-up visits as told by your health care provider. This is important. Contact a health care provider if:  Your symptoms do not improve with medicine.  You have side effects from steroids. These may include: ? Weight gain. ? Swelling. ? Insomnia. ? Mood changes. ? Bruising. ? High blood sugar readings, if you have diabetes. ? Higher than normal blood pressure readings, if you monitor your blood pressure. Get help right away if:  You develop symptoms of temporal arteritis, such as: ? A change in vision. ? Severe headache. ? Scalp pain. ? Jaw pain. Summary  Polymyalgia rheumatica is an inflammatory disorder that causes aching and stiffness in your muscles and joints.  The exact cause of this condition is not known.  This condition usually goes away without treatment. Your health care provider may give you low-dose steroids to help manage your pain and stiffness.  Rest and regular exercise will help the symptoms.

## 2020-04-14 ENCOUNTER — Telehealth: Payer: Self-pay | Admitting: Radiology

## 2020-04-14 ENCOUNTER — Other Ambulatory Visit (HOSPITAL_COMMUNITY): Payer: Self-pay | Admitting: General Practice

## 2020-04-14 LAB — HEPATITIS B CORE ANTIBODY, IGM: Hep B C IgM: NONREACTIVE

## 2020-04-14 LAB — HEPATITIS C ANTIBODY
Hepatitis C Ab: NONREACTIVE
SIGNAL TO CUT-OFF: 0.01 (ref <1.00)

## 2020-04-14 LAB — SEDIMENTATION RATE: Sed Rate: 19 mm/h (ref 0–20)

## 2020-04-14 LAB — HEPATITIS B SURFACE ANTIGEN: Hepatitis B Surface Ag: NONREACTIVE

## 2020-04-14 MED FILL — AMOXICILLIN 500 MG CAPSULE: 500 | 3 days supply | Qty: 12 | Fill #0

## 2020-04-14 NOTE — Telephone Encounter (Signed)
Spoke with patient's wife, they would like to be notified of patient's lab results and whether or not Dr. Dimple Casey is going to start the patient on a new medication. Please advise.

## 2020-04-21 ENCOUNTER — Other Ambulatory Visit: Payer: Self-pay | Admitting: Family Medicine

## 2020-04-21 ENCOUNTER — Telehealth: Payer: Self-pay | Admitting: Radiology

## 2020-04-21 ENCOUNTER — Other Ambulatory Visit: Payer: Self-pay | Admitting: Internal Medicine

## 2020-04-21 DIAGNOSIS — M2550 Pain in unspecified joint: Secondary | ICD-10-CM

## 2020-04-21 MED ORDER — FOLIC ACID 1 MG PO TABS: 1.0000 mg | ORAL_TABLET | Freq: Every day | ORAL | 0 refills | Status: DC

## 2020-04-21 MED ORDER — METHOTREXATE 2.5 MG PO TABS: 15.0000 mg | ORAL_TABLET | ORAL | 0 refills | Status: DC

## 2020-04-21 MED FILL — FOLIC ACID 1 MG TABS: 1 | 90 days supply | Qty: 90 | Fill #0

## 2020-04-21 NOTE — Telephone Encounter (Signed)
Spoke with patient's wife, advised lab tests show normal inflammation markers which indicates the prednisone dose controlling inflammation. Hepatitis screening was negative and previous labs were reviewed so it would be okay to start the methotrexate. New Rx for this 15mg  (6 tablets) once weekly. He should also take a 1mg  folic acid supplement daily if not already on one, to minimize risk of side effects on blood cell count.

## 2020-04-21 NOTE — Telephone Encounter (Signed)
Submitted a Prior Authorization request to [facility] for Methotrexate Tablets via Cover My Meds. Will update once we receive a response.

## 2020-04-21 NOTE — Addendum Note (Signed)
Addended by: Fuller Plan on: 04/21/2020 08:46 AM   Modules accepted: Orders

## 2020-04-21 NOTE — Telephone Encounter (Signed)
Lab tests show normal inflammation markers which indicates the prednisone dose controlling inflammation. Hepatitis screening was negative and previous labs were reviewed so it would be okay to start the methotrexate. New Rx for this 15mg  (6 tablets) once weekly. He should also take a 1mg  folic acid supplement daily if not already on one, to minimize risk of side effects on blood cell count.

## 2020-04-21 NOTE — Telephone Encounter (Signed)
Prior authorization needed for Methotrexate Rx. Faxing PA request form as well to [facility].

## 2020-04-21 NOTE — Progress Notes (Signed)
Hepatitis screen for MTX negative, as detailed in telephone note associated with same.

## 2020-04-21 NOTE — Telephone Encounter (Signed)
Routing to [facility] since she handles prior authorizations for MTX tablets.  Rx rheum focuses on Rasuvo/Otrexup since they come from specialty pharmacies. Thanks!

## 2020-04-22 ENCOUNTER — Other Ambulatory Visit: Payer: Self-pay

## 2020-04-22 ENCOUNTER — Encounter: Payer: Self-pay | Admitting: Pulmonary Disease

## 2020-04-22 ENCOUNTER — Ambulatory Visit: Payer: PPO | Admitting: Pulmonary Disease

## 2020-04-22 VITALS — BP 142/84 | HR 95 | Temp 97.0°F | Ht 71.0 in | Wt 315.0 lb

## 2020-04-22 DIAGNOSIS — M353 Polymyalgia rheumatica: Secondary | ICD-10-CM | POA: Diagnosis not present

## 2020-04-22 DIAGNOSIS — R0600 Dyspnea, unspecified: Secondary | ICD-10-CM

## 2020-04-22 DIAGNOSIS — Z79899 Other long term (current) drug therapy: Secondary | ICD-10-CM

## 2020-04-22 DIAGNOSIS — R0609 Other forms of dyspnea: Secondary | ICD-10-CM

## 2020-04-22 DIAGNOSIS — R0683 Snoring: Secondary | ICD-10-CM

## 2020-04-22 MED ORDER — FOLIC ACID 1 MG PO TABS: 1.0000 mg | ORAL_TABLET | Freq: Every day | ORAL | 0 refills | Status: DC

## 2020-04-22 MED ORDER — METHOTREXATE 2.5 MG PO TABS: 15.0000 mg | ORAL_TABLET | ORAL | 0 refills | Status: DC

## 2020-04-22 MED FILL — METHOTREXATE SODIUM 2.5 MG: 2.5 | 28 days supply | Qty: 24 | Fill #0

## 2020-04-22 NOTE — Progress Notes (Signed)
[facility]  Chief Complaint  Patient presents with  . Consult    Hospital follow up, sleep consult, productive cough at times with white phlegm    Constitutional:  BP (!) 142/84 (BP Location: Left Arm, Cuff Size: Normal)   Pulse 95   Temp (!) 97 F (36.1 C) (Other (Comment)) Comment (Src): wrist  Ht 5\' 11"  (1.803 m)   Wt (!) 315 lb (142.9 kg)   SpO2 96% Comment: Room air  BMI 43.93 kg/m   Past Medical History:  Chronic A fib, HTN, Gout, Nephrolithiasis, DJD, Pneumonia, Pre-DM, Polymyalgia rheumatica  Past Surgical History:  He  has a past surgical history that includes Retinal detachment surgery (03/2011); Eye examination under anesthesia w/ retinal cryotherapy and retinal laser (07/2011); Cholecystectomy; Knee arthroscopy; Total knee arthroplasty (2003); Eye surgery (02/2011); Total knee arthroplasty (02/07/2012); Cardioversion (N/A, 03/04/2015); Colonoscopy (N/A, 01/01/2016); polypectomy (01/01/2016); Colonoscopy (N/A, 01/14/2017); and Lumbar laminectomy/decompression microdiscectomy (N/A, 08/18/2017).  Brief Summary:  Garrett Munoz is a 75 y.o. male former smoker with restrictive lung disease, obstructive sleep apnea, hypoxic/hypercapnic respiratory failure, and pulmonary hypertension.      Subjective:   He was in hospital in November 2021 from CHF exacerbation.  He has lost about 20 lbs since then.  Didn't need oxygen at home.  Has CPAP at home, but hasn't been able to use it.  Didn't like using Bipap in hospital.  Not wheezing anymore.  Joint pains improved since being on prednisone.  Not having cough, wheeze, or sputum.  Still gets winded when walking.  Sleeping better, but still snores.  Physical Exam:   Appearance - well kempt   ENMT - no sinus tenderness, no oral exudate, no LAN, Mallampati 4 airway, no stridor  Respiratory - equal breath sounds bilaterally, no wheezing or rales  CV - irregular rate and rhythm, no  murmurs  Ext - 1+ edema  Skin - no rashes  Psych - normal mood and affect   Pulmonary testing:   Spirometry 06/04/19 >> FEV1 1.12 (35%), FEV1% 76  ABG 03/10/20 >> pH 7.32, PCO2 70.8, PO2 68.9  Chest Imaging:    Sleep Tests:    Cardiac Tests:   Echo 03/10/20 >> EF 60 to 65%, severe LVH, mild/mod LA dilation, aortic root 43 mm  Social History:  He  reports that he quit smoking about 28 years ago. His smoking use included cigarettes. He has a 30.00 pack-year smoking history. He has never used smokeless tobacco. He reports current alcohol use. He reports that he does not use drugs.  Family History:  His family history includes Lung cancer in his father.     Assessment/Plan:   Snoring. - will arrange for home sleep study to further assess for sleep apnea  Dyspnea on exertion. 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 - improved some with weight loss since he was in hospital in November 2021  Polymyalgia rheumatica - followed by Dr. Vernelle Emerald with [facility]  Chronic A fib, chronic diastolic CHF, HTN. - followed by Dr. Shelva Majestic with [facility]  Time Spent Involved in Patient Care on Day of Examination:  33 minutes  Follow up:  Patient Instructions  Will schedule pulmonary function test and home sleep study, and then arrange for follow up after these tests are done   Medication List:   Allergies as of  04/22/2020   No Known Allergies     Medication List       Accurate as of April 22, 2020 10:54 AM. If you have any questions, ask your nurse or doctor.        STOP taking these medications   amoxicillin 500 MG capsule Commonly known as: AMOXIL Stopped by: Coralyn Helling, MD     TAKE these medications   allopurinol 300 MG tablet Commonly known as: ZYLOPRIM TAKE 1 TABLET (300 MG TOTAL) BY  MOUTH DAILY.   apixaban 5 MG Tabs tablet Commonly known as: Eliquis Take 1 tablet (5 mg total) by mouth 2 (two) times daily.   carvedilol 12.5 MG tablet Commonly known as: COREG TAKE 1 & 1/2 TABLETS BY MOUTH EVERY MORNING AND 1 TABLET IN THE EVENING   fluticasone 50 MCG/ACT nasal spray Commonly known as: FLONASE SHAKE LIQUID AND USE 2 SPRAYS IN EACH NOSTRIL DAILY What changed: See the new instructions.   folic acid 1 MG tablet Commonly known as: FOLVITE Take 1 tablet (1 mg total) by mouth daily.   furosemide 80 MG tablet Commonly known as: Lasix Take 1 tablet (80 mg total) by mouth daily.   HYDROcodone-acetaminophen 10-325 MG tablet Commonly known as: NORCO Take 1 tablet by mouth 3 (three) times daily as needed for pain.   methotrexate 2.5 MG tablet Commonly known as: RHEUMATREX Take 6 tablets (15 mg total) by mouth once a week. Caution:Chemotherapy. Protect from light.   multivitamin with minerals tablet Take 1 tablet by mouth daily.   potassium chloride SA 20 MEQ tablet Commonly known as: KLOR-CON Take 1 tablet (20 mEq total) by mouth daily.   predniSONE 20 MG tablet Commonly known as: DELTASONE Take 1 tablet (20 mg total) by mouth daily with breakfast. What changed: how much to take   predniSONE 5 MG tablet Commonly known as: DELTASONE Take 1 tablet (5 mg total) by mouth daily with breakfast. What changed: Another medication with the same name was changed. Make sure you understand how and when to take each.   temazepam 30 MG capsule Commonly known as: RESTORIL TAKE 1 CAPSULE BY MOUTH AT BEDTIME AS NEEDED FOR SLEEP.       Signature:  Coralyn Helling, MD [facility] Pager - [phone] 04/22/2020, 10:54 AM

## 2020-04-22 NOTE — Patient Instructions (Signed)
Will schedule pulmonary function test and home sleep study, and then arrange for follow up after these tests are done

## 2020-04-25 ENCOUNTER — Encounter: Payer: Self-pay | Admitting: Family Medicine

## 2020-04-25 ENCOUNTER — Other Ambulatory Visit: Payer: Self-pay

## 2020-04-25 ENCOUNTER — Ambulatory Visit (INDEPENDENT_AMBULATORY_CARE_PROVIDER_SITE_OTHER): Payer: PPO | Admitting: Family Medicine

## 2020-04-25 ENCOUNTER — Other Ambulatory Visit: Payer: Self-pay | Admitting: Family Medicine

## 2020-04-25 VITALS — BP 130/78 | HR 90 | Temp 97.5°F | Ht 71.0 in | Wt 307.0 lb

## 2020-04-25 DIAGNOSIS — M353 Polymyalgia rheumatica: Secondary | ICD-10-CM

## 2020-04-25 DIAGNOSIS — I509 Heart failure, unspecified: Secondary | ICD-10-CM | POA: Diagnosis not present

## 2020-04-25 MED ORDER — GABAPENTIN 300 MG PO CAPS: 600.0000 mg | ORAL_CAPSULE | Freq: Three times a day (TID) | ORAL | 3 refills | Status: DC

## 2020-04-25 MED FILL — GABAPENTIN 300 MG CAPSULE: 300 | 90 days supply | Qty: 540 | Fill #0

## 2020-04-25 NOTE — Progress Notes (Signed)
Subjective:    Patient ID: PHU RECORD, male    DOB: 07-11-1945, 75 y.o.   MRN: [id number]  HPI Admit date: 03/09/2020 Discharge date: 03/13/2020  Admitted From: home Disposition:  home Discharging physician: Dwyane Dee, MD  Recommendations for Outpatient Follow-up:  1. Outpt sleep study 2. Repeat BMP given d/c with lasix   Patient discharged to home in Discharge Condition: stable CODE STATUS: Full Diet recommendation:     Diet Orders (From admission, onward)           Start     Ordered    03/12/20 0000  Diet - low sodium heart healthy        03/12/20 1137           Hospital Course: Garrett Munoz is a 75 yo male with PMH chronic A. fib on Eliquis, multiple joints OA, chronic bronchitis, morbid obesity, hypertension, gout who presented with SOB and swelling.  Patient has chronic A. fib has been following with cardiologist Dr. Claiborne Billings, most recent visit was January this year. No history of CHF documented. And according to patient's wife, patient'sA. fib has been fairly controlled. Recently patient has had worsening of back pain for which he has been following with pain management doctor. No new medication recent months.  Patient started to have increasing swelling on both legs 3 to 4 weeks ago, along with increasing shortness of breath initially was exertional and became worse this week. Wife also noticed patient has had increasing cough and wheezing since last week has been using more albuterol nebulizer, patient also has history of OSA not been compliant with CPAP. Denies any chest pain, no fever chills, no chest pain no urinary symptoms no diarrhea. Wife also noticed that the patient has been having "fingers nails and toes discoloration recently"  Chest x-ray showed cardiomegaly and pulmonary edema, patient was found to be hypoxic.Patient became hypoxic in the ED, very agitated remove oxygen and O2 sat ration dropped to upper 50s and then became  lethargic. He was eventually placed on BiPAP. He had hypercarbic respiratory failure on blood gas monitoring which improved with ongoing BiPAP therapy. He was also started on diuresis with Lasix given his volume overload. An echo was also ordered. EF 60-65%, Severe LVH.  Mildly reduced right ventricular systolic function. Pulmonology was consulted on admission as well. He will need outpatient formal PFTs. Prior to discharge he was able to be weaned off of oxygen to room air and ambulated well in the hallway with no desaturation events.  He was also arranged for home health as well as NIV but has a scheduled sleep study at time of discharge as well.   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 Needs to be adequately diuresed then obtain outpatient PFTs -Required continuous BiPAP on admission which has now been weaned off. Continue nightly BiPAP as well as as needed -Continue Lasix -Appreciate pulmonology evaluation -Discontinued doxycycline and steroids -Continue duo nebs for now  Acute CHF (congestive heart failure) ([place]) -Patient had clinical signs of volume overload on admission. BNP elevated, 261 -Echo also obtained on admission: EF 60 to 65%, severe LVH. Diastolic dysfunction could not be evaluated. RV systolic function mildly reduced. No RVH. - continue lasix  Acute metabolic encephalopathy-resolved as of 03/10/2020 -Likely due to hypercarbia on admission. This has improved -Continue monitoring for any further mentation changes  Physical deconditioning -We discussed his  current status at length bedside with him and his wife.  He is declining going to SNF/rehab at this time and wishes to go home with home health.  Wife also bedside for the conversation and in agreement and comfortable taking patient home.  Informed patient that he could change his  mind if he wanted rehab at any point.  For now, tentative plan will be for home with home health PT at discharge  Osteoarthritis - continue current pain regimen; caution with oversedation   Prediabetes -A1c 5.8% -Continue diet control  Atrial fibrillation (HCC) -Continue Coreg and Eliquis  Gout -Uric acid level 4.3. Continue to avoid HCTZ  Patient is a very pleasant 75 year old Caucasian male who is here today for hospital discharge follow-up.  He was admitted with pulmonary edema and hypercapnic hypoxic respiratory failure.  In addition to pulmonary edema from acute congestive heart failure, it was felt that he was likely having a COPD exacerbation as well.  His wife states that he had been wheezing for several weeks prior to his hospital admission.  He was also gaining weight prior to his hospital admission.  His wife states that he was up to 322 pounds on admission to the hospital.  I reviewed his echocardiogram.  Ejection fraction was 60 to 65%.  Study limitations prevented adequate assessment of his diastolic dysfunction.  I feel that the patient most likely has diastolic heart failure and possible even right-sided heart failure due to a combination of morbid obesity, untreated obstructive sleep apnea.  Patient is unable to tolerate his BiPAP at night due to claustrophobia.  He states that he is tried a number of different devices and has been unable to tolerate any of the mask.  Therefore his obstructive sleep apnea remains untreated.  I tried the patient on Breztri for suspected COPD.  However the patient due to cost it only been using it as needed.  He is not been on a daily long-acting bronchodilator prior to his hospital admission.  Today on examination he still has +1 pitting edema in his legs up to his mid shin however his lungs are clear to auscultation bilaterally except for a few scattered expiratory wheezes. At that time, my plan was: Patient has an appointment in January to see  the pulmonologist however I want to start him on a long-acting bronchodilator.  Due to the risk of pneumonia I will avoid an inhaled corticosteroid at the present time but I will start the patient on Anoro 1 inhalation daily.  I believe that untreated COPD is likely contributing to his diastolic dysfunction and possible right-sided heart failure.  Also explained to the patient that untreated sleep apnea and morbid obesity both contribute as well.  Therefore I encouraged the patient to try to lose weight by any means necessary especially caloric restriction.  I would also encourage him to try again to treat the obstructive sleep apnea with BiPAP.  Blood pressure is currently well controlled.  I will check a BMP and a CBC to monitor his electrolytes and creatinine on 80 mg of Lasix.  I believe he has virtually euvolemic and therefore I would not change his Lasix dose unless his electrolytes are abnormal.  03/31/20 Wt Readings from Last 3 Encounters:  04/25/20 (!) 307 lb (139.3 kg)  04/22/20 (!) 315 lb (142.9 kg)  04/10/20 (!) 310 lb (140.6 kg)   Patient has gained 3 pounds since I last saw him. He does have trace bipedal edema to his mid shin however is  not impressive. He denies any shortness of breath beyond his baseline. His biggest concern right now is severe pain. He reports severe pain in his left shoulder and in his left hand. There is visible swelling at the base of the first MCP joint as well as at the first [facility] joint. It hurts simply for me to touch that area with my hand. He has severe pain with range of motion in his left shoulder. He also has pain in his right wrist. He complains of pain in both hip joints and in his butt muscles bilaterally. He is hurting all over his body and he reports that the pain is a 9 on a scale of 1-10. He is having a difficult time standing and he reports weakness in his legs. Pain is out of proportion to exam and certainly raises the concern for an autoimmune process  such as PMR or rheumatoid arthritis.  At that time, my plan was: Given the severity of the pain in the number of joints involved, I'm concerned about an autoimmune disease such as PMR or rheumatoid arthritis. Check sed rate, rheumatoid factor, CBC, CMP. Start the patient on prednisone 20 mg a day as a suspect PMR. Recheck the patient on Thursday to see if he is improving. If he is improving dramatically, I would recommend a referral to rheumatology for other long-term options besides prednisone. At the present time continue Lasix 80 mg a day as the patient does not appear fluid overloaded  04/03/20 Patient has responded dramatically to 20 mg a day of prednisone.  He states that he is more than 80% better.  The swellings in both hands have dramatically subsided.  The pain in his shoulders is essentially gone away.  He continues to have some soreness in his bilateral hips and gluteus area but this even is much better.  In fact as I was walking to the exam room, the patient was walking to go to the bathroom.  3 days ago he cannot even hardly stand.  Therefore I definitely believe that were dealing with an autoimmune process rather than just "simple arthritis".  His sed rate was also elevated to greater than 60 although given his age this is difficult to interpret.  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  Decrease prednisone to 15 mg a day for 1 week then decrease to 12.5 mg of prednisone per day for 2 weeks then decrease to 10 mg of prednisone today if the patient will tolerate.  In the meantime arrange second opinion with rheumatology as I want to get the patient off prednisone as quickly as possible    04/25/20 Patient has done remarkably well since we started him on prednisone for PMR.  He states that the pain in his shoulders and in his  hips has essentially resolved.  He is down to 10 mg of prednisone.  He has seen the rheumatologist and they have started him on methotrexate.  He states he feels so much better since starting this.  He is very appreciative of how much less he is hurting.  He also started taking gabapentin 300 mg 3 times a day and gradually increased to 600 mg 3 times a day on his own from a previous prescription.  He states that he was having burning and stinging pain radiating down both legs  from his hips into his feet.  This has improved on the gabapentin.  He denies any dizziness or drowsiness.  His wife states that he has done well on the gabapentin with no sedation.  She denies any altered mental status.  His weight continues to drop on 80 mg of Lasix.  When I last saw him he was 311.  Today he is 307.  He has trace bipedal edema and his lungs are clear to auscultation bilaterally.   Past Medical History:  Diagnosis Date  . Atrial fibrillation ([place])   . Bronchitis    hx  . Cataract    right eye  . CHF NYHA class III, acute on chronic, diastolic ([place])   . COPD (chronic obstructive pulmonary disease) ([place])   . DDD (degenerative disc disease), lumbosacral   . Dysrhythmia    irregular heatbeat  . Eye worm    right eye  . Gout   . History of kidney stones   . Hypertension   . Left knee DJD   . Morbid obesity with BMI of 40.0-44.9, adult ([place])   . Pneumonia    hx  . Prediabetes   . Radicular syndrome of left leg   . S/P total knee replacement    right  . Sleep apnea    can't use sleep study 10 yrs ago  . 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    Past Surgical History:  Procedure Laterality Date  . CARDIOVERSION N/A 03/04/2015   Procedure: CARDIOVERSION;  Surgeon: [name], MD;  Location: [place];  Service: Cardiovascular;  Laterality: N/A;  . CHOLECYSTECTOMY    . COLONOSCOPY N/A 01/01/2016   Procedure: COLONOSCOPY;  Surgeon: [name], MD;  [facility];  Service: Endoscopy;   Laterality: N/A;  1:45  . COLONOSCOPY N/A 01/14/2017   Procedure: COLONOSCOPY;  Surgeon: [name], MD;  [facility];  Service: Endoscopy;  Laterality: N/A;  12:05  . EYE EXAMINATION UNDER ANESTHESIA W/ RETINAL CRYOTHERAPY AND RETINAL LASER  07/2011  . EYE SURGERY  02/2011   cat rt  . KNEE ARTHROSCOPY     bilateral  . LUMBAR LAMINECTOMY/DECOMPRESSION MICRODISCECTOMY N/A 08/18/2017   Procedure: Bilateral microlumbar decompression Lumbar four-Lumbar five;  Surgeon: [name], MD;  Location: [place];  Service: Orthopedics;  Laterality: N/A;  . POLYPECTOMY  01/01/2016   Procedure: POLYPECTOMY;  Surgeon: [name], MD;  [facility];  Service: Endoscopy;;  colon  . RETINAL DETACHMENT SURGERY  03/2011   rt  . TOTAL KNEE ARTHROPLASTY  2003   rt  . TOTAL KNEE ARTHROPLASTY  02/07/2012   Procedure: TOTAL KNEE ARTHROPLASTY;  Surgeon: [name], MD;  Location: [place];  Service: Orthopedics;  Laterality: Left;   Current Outpatient Medications on File Prior to Visit  Medication Sig Dispense Refill  . allopurinol (ZYLOPRIM) 300 MG tablet TAKE 1 TABLET (300 MG TOTAL) BY MOUTH DAILY. 90 tablet 3  . apixaban (ELIQUIS) 5 MG TABS tablet Take 1 tablet (5 mg total) by mouth 2 (two) times daily. 180 tablet 1  . carvedilol (COREG) 12.5 MG tablet TAKE 1 & 1/2 TABLETS BY MOUTH EVERY MORNING AND 1 TABLET IN THE EVENING 225 tablet 3  . fluticasone (FLONASE) 50 MCG/ACT nasal spray SHAKE LIQUID AND USE 2 SPRAYS IN EACH NOSTRIL DAILY (Patient taking differently: Place 2 sprays into both nostrils daily as needed for allergies or rhinitis.) 16 g 6  . folic acid (FOLVITE) 1 MG tablet  Take 1 tablet (1 mg total) by mouth daily. 90 tablet 0  . furosemide (LASIX) 80 MG tablet Take 1 tablet (80 mg total) by mouth daily. 30 tablet 3  . HYDROcodone-acetaminophen (NORCO) 10-325 MG tablet Take 1 tablet by mouth 3 (three) times daily as needed for pain.    . methotrexate (RHEUMATREX) 2.5 MG  tablet Take 6 tablets (15 mg total) by mouth once a week. Caution:Chemotherapy. Protect from light. 24 tablet 0  . Multiple Vitamins-Minerals (MULTIVITAMIN WITH MINERALS) tablet Take 1 tablet by mouth daily. 120 tablet 2  . potassium chloride SA (KLOR-CON) 20 MEQ tablet Take 1 tablet (20 mEq total) by mouth daily. 30 tablet 3  . predniSONE (DELTASONE) 20 MG tablet Take 1 tablet (20 mg total) by mouth daily with breakfast. (Patient taking differently: Take 15 mg by mouth daily with breakfast.) 30 tablet 0  . predniSONE (DELTASONE) 5 MG tablet Take 1 tablet (5 mg total) by mouth daily with breakfast. 30 tablet 1  . temazepam (RESTORIL) 30 MG capsule TAKE 1 CAPSULE BY MOUTH AT BEDTIME AS NEEDED FOR SLEEP. 30 capsule 2   No current facility-administered medications on file prior to visit.   No Known Allergies Social History   Socioeconomic History  . Marital status: Married    Spouse name: Not on file  . Number of children: Not on file  . Years of education: Not on file  . Highest education level: Not on file  Occupational History  . Not on file  Tobacco Use  . Smoking status: Former Smoker    Packs/day: 2.00    Years: 15.00    Pack years: 30.00    Types: Cigarettes    Quit date: 02/02/1992    Years since quitting: 28.2  . Smokeless tobacco: Never Used  . Tobacco comment: occ alcohol  Vaping Use  . Vaping Use: Never used  Substance and Sexual Activity  . Alcohol use: Yes    Alcohol/week: 0.0 standard drinks    Comment: occasional  . Drug use: No  . Sexual activity: Not on file  Other Topics Concern  . Not on file  Social History Narrative  . Not on file   Social Determinants of Health   Financial Resource Strain: Low Risk   . Difficulty of Paying Living Expenses: Not very hard  Food Insecurity: Not on file  Transportation Needs: Not on file  Physical Activity: Not on file  Stress: Not on file  Social Connections: Not on file  Intimate Partner Violence: Not on file       Review of Systems  All other systems reviewed and are negative.      Objective:   Physical Exam Vitals reviewed.  Constitutional:      General: He is not in acute distress.    Appearance: He is well-developed. He is not diaphoretic.  HENT:     Right Ear: External ear normal.     Left Ear: External ear normal.     Nose: Nose normal.     Mouth/Throat:     Pharynx: No oropharyngeal exudate.  Neck:     Thyroid: No thyromegaly.     Vascular: No JVD.     Trachea: No tracheal deviation.  Cardiovascular:     Rate and Rhythm: Normal rate. Rhythm irregularly irregular.     Heart sounds: Normal heart sounds.  Pulmonary:     Effort: Pulmonary effort is normal.     Breath sounds: No wheezing, rhonchi or rales.  Chest:  Chest wall: No tenderness.  Abdominal:     General: Bowel sounds are normal. There is distension.     Palpations: Abdomen is soft. There is no mass.     Tenderness: There is no abdominal tenderness. There is no guarding or rebound.  Musculoskeletal:     Right shoulder: No tenderness. Normal range of motion.     Left shoulder: No tenderness. Normal range of motion.     Left wrist: No swelling, tenderness or bony tenderness. Normal range of motion.     Cervical back: Neck supple.     Lumbar back: Tenderness present. No deformity. Decreased range of motion.     Right hip: No tenderness or bony tenderness. Normal range of motion.     Left hip: No tenderness. Normal range of motion.     Right lower leg: No edema.     Left lower leg: No edema.  Lymphadenopathy:     Cervical: No cervical adenopathy.  Skin:    Coloration: Skin is not pale.     Findings: No erythema or rash.  Neurological:     Mental Status: He is alert and oriented to person, place, and time.     Cranial Nerves: No cranial nerve deficit.     Motor: No abnormal muscle tone.     Coordination: Coordination normal.     Deep Tendon Reflexes: Reflexes normal.  Psychiatric:        Behavior:  Behavior normal.        Thought Content: Thought content normal.        Judgment: Judgment normal.           Assessment & Plan:   PMR (polymyalgia rheumatica) (HCC)  Chronic congestive heart failure, unspecified heart failure type ([place])  Management of PMR I will defer from this point on to the rheumatologist.  They will continue to try to wean him down on prednisone and allow methotrexate to manage his pain.  However I will treat the neuropathic pain in his legs with gabapentin.  He can take 600 mg 3 times a day however I recommended they monitor for sedation or ataxia.  He appears euvolemic today.  Family would like to try to wean down to 40 mg a day.  They will check his weight and swelling every day and resume 80 mg of Lasix if they see his weight go up more than 2 pounds in a day.  They will let me know over the next week how he is doing on the reduced dose of Lasix.  I am glad that he is feeling better.

## 2020-04-28 MED FILL — predniSONE 5 MG TABS: 5 | 30 days supply | Qty: 30 | Fill #1

## 2020-05-06 MED FILL — TEMAZEPAM 30 MG CAPSULE: 30 | 30 days supply | Qty: 30 | Fill #1

## 2020-05-14 NOTE — Progress Notes (Signed)
Office Visit Note  Patient: Garrett Munoz             Date of Birth: January 18, 1946           MRN: [id number]             PCP: [name], MD Referring: [name], MD Visit Date: 05/15/2020   Subjective:   History of Present Illness: Garrett Munoz is a 75 y.o. male here for follow up of probable PMR after recently starting methotrexate last month for treatment of inflammation and goal of tapering prednisone. He has not noticed any trouble or side effects taking the methotrexate.  His pain and stiffness improved almost completely by the time of his primary care follow-up earlier this month.  At that time he discontinued the 5 mg prednisone due to symptoms being well controlled.  This remains well controlled for about a week however in the past 1 week he is having again worsening shoulder and hip and hand pain and stiffness and swelling of his hands bilaterally.  He feels his pain is severe enough to impede sleep and is making daily activities impossible.    Review of Systems  Constitutional: Positive for fatigue.  HENT: Positive for mouth dryness. Negative for mouth sores and nose dryness.   Eyes: Negative for pain, itching, visual disturbance and dryness.  Respiratory: Positive for cough. Negative for hemoptysis, shortness of breath and difficulty breathing.   Cardiovascular: Positive for irregular heartbeat and swelling in legs/feet. Negative for chest pain and palpitations.  Gastrointestinal: Negative for abdominal pain, blood in stool, constipation and diarrhea.  Endocrine: Negative for increased urination.  Genitourinary: Negative for painful urination.  Musculoskeletal: Positive for arthralgias, joint pain, joint swelling, myalgias, muscle weakness, morning stiffness, muscle tenderness and myalgias.  Skin: Negative for color change, rash and redness.  Allergic/Immunologic: Negative for susceptible to infections.  Neurological: Positive for weakness. Negative for  dizziness, numbness, headaches and memory loss.  Hematological: Negative for swollen glands.  Psychiatric/Behavioral: Negative for confusion and sleep disturbance.    PMFS History:  Patient Active Problem List   Diagnosis Date Noted  . Polymyalgia rheumatica ([place]) 04/10/2020  . 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  . High risk medication use 04/10/2020  . CHF NYHA class III, acute on chronic, diastolic ([place])   . Physical deconditioning 03/11/2020  . Prediabetes 03/10/2020  . Acute CHF (congestive heart failure) ([place]) 03/09/2020  . Acute respiratory failure with hypoxia and hypercapnia (HCC)   . Acquired thrombophilia ([place]) 10/19/2019  . Irritable bowel syndrome (IBS) 02/27/2019  . Bilateral carpal tunnel syndrome 08/28/2018  . Eustachian tube dysfunction, left 02/09/2018  . Sensorineural hearing loss (SNHL), 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  . Tinnitus of both ears 02/09/2018  . Spinal stenosis at L4-L5 level 08/18/2017  . Spinal stenosis of lumbar region 07/19/2017  . Degeneration of lumbosacral intervertebral disc 05/28/2017  . Low back pain 05/28/2017  . Gout 08/24/2016  . S/P total knee replacement 08/24/2016  . Cortical age-related cataract of left eye 08/24/2016  . Occult blood in stools 11/10/2015  . CAP (community acquired pneumonia) 04/10/2015  . Fatigue 04/10/2015  . Hypertensive disorder 04/10/2015  . HCAP (healthcare-associated pneumonia)   . Persistent atrial fibrillation ([place])   . Anticoagulation adequate 02/21/2015  . Obstructive sleep apnea syndrome 02/01/2015  . Atrial fibrillation ([place]) 02/01/2015  . History of gout 02/01/2015  . Morbid obesity ([place]) 02/01/2015  . Pain in left knee 02/24/2012  . Weakness of left leg 02/24/2012  . Infection  by loa loa   . Radicular syndrome of left leg   . Left knee DJD     Past Medical History:  Diagnosis Date  . Atrial fibrillation ([place])   . Bronchitis    hx  . Cataract    right eye  . CHF NYHA class III, acute on chronic,  diastolic ([place])   . COPD (chronic obstructive pulmonary disease) ([place])   . DDD (degenerative disc disease), lumbosacral   . Dysrhythmia    irregular heatbeat  . Eye worm    right eye  . Gout   . History of kidney stones   . Hypertension   . Left knee DJD   . Morbid obesity with BMI of 40.0-44.9, adult ([place])   . Pneumonia    hx  . Prediabetes   . Radicular syndrome of left leg   . S/P total knee replacement    right  . Sleep apnea    can't use sleep study 10 yrs ago  . 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     Family History  Problem Relation Age of Onset  . Lung cancer Father    Past Surgical History:  Procedure Laterality Date  . CARDIOVERSION N/A 03/04/2015   Procedure: CARDIOVERSION;  Surgeon: [name], MD;  Location: [facility];  Service: Cardiovascular;  Laterality: N/A;  . CHOLECYSTECTOMY    . COLONOSCOPY N/A 01/01/2016   Procedure: COLONOSCOPY;  Surgeon: [name], MD;  [facility];  Service: Endoscopy;  Laterality: N/A;  1:45  . COLONOSCOPY N/A 01/14/2017   Procedure: COLONOSCOPY;  Surgeon: [name], MD;  [facility];  Service: Endoscopy;  Laterality: N/A;  12:05  . EYE EXAMINATION UNDER ANESTHESIA W/ RETINAL CRYOTHERAPY AND RETINAL LASER  07/2011  . EYE SURGERY  02/2011   cat rt  . KNEE ARTHROSCOPY     bilateral  . LUMBAR LAMINECTOMY/DECOMPRESSION MICRODISCECTOMY N/A 08/18/2017   Procedure: Bilateral microlumbar decompression Lumbar four-Lumbar five;  Surgeon: [name], MD;  Location: [facility];  Service: Orthopedics;  Laterality: N/A;  . POLYPECTOMY  01/01/2016   Procedure: POLYPECTOMY;  Surgeon: [name], MD;  [facility];  Service: Endoscopy;;  colon  . RETINAL DETACHMENT SURGERY  03/2011   rt  . TOTAL KNEE ARTHROPLASTY  2003   rt  . TOTAL KNEE ARTHROPLASTY  02/07/2012   Procedure: TOTAL KNEE ARTHROPLASTY;  Surgeon: [name], MD;  Location: [facility];  Service: Orthopedics;  Laterality: Left;   Social  History   Social History Narrative  . Not on file   Immunization History  Administered Date(s) Administered  . Fluad Quad(high Dose 65+) 12/20/2018, 01/09/2020  . Influenza, High Dose Seasonal PF 01/04/2017  . Influenza,inj,Quad PF,6+ Mos 01/04/2018  . Influenza-Unspecified 01/18/2016, 01/24/2017  . Moderna Sars-Covid-2 Vaccination 05/01/2019, 05/28/2019, 12/25/2019  . Pneumococcal Conjugate-13 01/04/2017, 02/17/2017  . Pneumococcal-Unspecified 02/17/2017  . Zoster Recombinat (Shingrix) 12/07/2017, 02/13/2018     Objective: Vital Signs: BP (!) 144/78 (BP Location: Left Arm, Patient Position: Sitting, Cuff Size: Normal)   Pulse 82   Ht 5\' 10"  (1.778 m)   Wt (!) 328 lb (148.8 kg)   BMI 47.06 kg/m    Physical Exam Constitutional:      Appearance: He is obese.  Skin:    General: Skin is warm and dry.     Findings: No rash.  Neurological:     General: No focal deficit present.     Mental Status: He is alert.  Psychiatric:        Mood and Affect: Mood normal.     Musculoskeletal Exam:  Shoulders intact range of motion but painful abduction overhead Elbows full range of motion wrists full range of motion Bony nodules present on fingers of both hands, left hand MCP swelling and tenderness, right hand no synovitis but with MCP tenderness to palpation Hip pain with resisted flexion and decreased proximal strength Knees crepitus with good range of motion resistance provokes pain closer to hip  Investigation: No additional findings.  Imaging: No results found.  Recent Labs: Lab Results  Component Value Date   WBC 7.1 03/31/2020   HGB 14.2 03/31/2020   PLT 195 03/31/2020   NA 136 03/31/2020   K 3.5 03/31/2020   CL 94 (L) 03/31/2020   CO2 31 03/31/2020   GLUCOSE 104 (H) 03/31/2020   BUN 14 03/31/2020   CREATININE 0.77 03/31/2020   BILITOT 0.9 03/31/2020   ALKPHOS 57 03/09/2020   AST 15 03/31/2020   ALT 12 03/31/2020   PROT 6.1 03/31/2020   ALBUMIN 3.0 (L)  03/09/2020   CALCIUM 8.7 03/31/2020   GFRAA 102 03/31/2020    Speciality Comments: No specialty comments available.  Procedures:  40mg /mL 1 mL depomedrol injection into left deltoid muscle with 1.5" 22g needle. Patient allergies reviewed and skin was prepared with betadine solution. Injection tolerated with no immediate adverse effects.  Allergies: Patient has no known allergies.   Assessment / Plan:     Visit Diagnoses: Polymyalgia rheumatica ([place]) - Plan: Sedimentation rate  He describes significant symptom worsening with discontinuation of the 5 mg prednisone I suspect it may be too early after new start of methotrexate for discontinuing glucocorticoids completely.  Recommend he restart at 5 mg daily dose while continuing the methotrexate.  Will attempt again to taper off after next follow-up in about 6 weeks.  40 mg IM Depo-Medrol shot in clinic today for current exacerbation.  Recommended if he does not notice any benefit within 48 to 72 hours contact clinic.  Repeat sedimentation rate expected to be elevated today with the proximal joint symptoms worse also seems to have active peripheral synovitis which is new.  High risk medication use - Plan: CBC with Differential/Platelet, COMPLETE METABOLIC PANEL WITH GFR  Now 1 month since the methotrexate start we will check CBC and CMP for monitoring of cytopenia or liver toxicity.  Acute congestive heart failure, unspecified heart failure type (HCC)  Weight is up since most recent other visits he was actually a bit lower when on the steroids before this recent exacerbation.  Good bit of lower extremity edema but he feels its at a normal amount not having distal pain or inflammation problems.  Orders: Orders Placed This Encounter  Procedures  . CBC with Differential/Platelet  . COMPLETE METABOLIC PANEL WITH GFR  . Sedimentation rate   Meds ordered this encounter  Medications  . methylPREDNISolone acetate (DEPO-MEDROL) injection 40 mg   . methotrexate (RHEUMATREX) 2.5 MG tablet    Sig: Take 6 tablets (15 mg total) by mouth once a week. Caution:Chemotherapy. Protect from light.    Dispense:  60 tablet    Refill:  0  . predniSONE (DELTASONE) 5 MG tablet    Sig: Take 1 tablet (5 mg total) by mouth daily with breakfast.    Dispense:  60 tablet    Refill:  0    Follow-Up Instructions: Return in about 6 weeks (around 06/26/2020).   Collier Salina, MD  Note -  This record has been created using [employer].  Chart creation errors have been sought, but may not always  have been located. Such creation errors do not reflect on  the standard of medical care.

## 2020-05-15 ENCOUNTER — Other Ambulatory Visit: Payer: Self-pay | Admitting: Internal Medicine

## 2020-05-15 ENCOUNTER — Ambulatory Visit: Payer: PPO | Admitting: Internal Medicine

## 2020-05-15 ENCOUNTER — Other Ambulatory Visit: Payer: Self-pay

## 2020-05-15 ENCOUNTER — Encounter: Payer: Self-pay | Admitting: Internal Medicine

## 2020-05-15 VITALS — BP 144/78 | HR 82 | Ht 70.0 in | Wt 328.0 lb

## 2020-05-15 DIAGNOSIS — M353 Polymyalgia rheumatica: Secondary | ICD-10-CM

## 2020-05-15 DIAGNOSIS — Z79899 Other long term (current) drug therapy: Secondary | ICD-10-CM

## 2020-05-15 DIAGNOSIS — I509 Heart failure, unspecified: Secondary | ICD-10-CM

## 2020-05-15 MED ORDER — METHOTREXATE 2.5 MG PO TABS: 15.0000 mg | ORAL_TABLET | ORAL | 0 refills | Status: DC

## 2020-05-15 MED ORDER — METHYLPREDNISOLONE ACETATE 40 MG/ML IJ SUSP: 40.0000 mg | Freq: Once | INTRAMUSCULAR | Status: DC

## 2020-05-15 MED ORDER — PREDNISONE 5 MG PO TABS: 5.0000 mg | ORAL_TABLET | Freq: Every day | ORAL | 0 refills | Status: DC

## 2020-05-15 NOTE — Patient Instructions (Signed)
Continue methotrexate 6 tablets weekly  Resume prednisone 5mg  daily for the joint inflammation.  We are checking labs to make sure no problems with the medication, and will call after reviewing these results.

## 2020-05-16 ENCOUNTER — Other Ambulatory Visit: Payer: Self-pay | Admitting: Internal Medicine

## 2020-05-16 ENCOUNTER — Telehealth: Payer: Self-pay

## 2020-05-16 ENCOUNTER — Other Ambulatory Visit (HOSPITAL_COMMUNITY): Payer: Self-pay | Admitting: Internal Medicine

## 2020-05-16 DIAGNOSIS — M353 Polymyalgia rheumatica: Secondary | ICD-10-CM

## 2020-05-16 LAB — COMPLETE METABOLIC PANEL WITH GFR
AG Ratio: 1.5 (calc) (ref 1.0–2.5)
ALT: 18 U/L (ref 9–46)
AST: 18 U/L (ref 10–35)
Albumin: 3.7 g/dL (ref 3.6–5.1)
Alkaline phosphatase (APISO): 69 U/L (ref 35–144)
BUN: 13 mg/dL (ref 7–25)
CO2: 34 mmol/L — ABNORMAL HIGH (ref 20–32)
Calcium: 9 mg/dL (ref 8.6–10.3)
Chloride: 100 mmol/L (ref 98–110)
Creat: 0.84 mg/dL (ref 0.70–1.18)
GFR, Est African American: 99 mL/min/{1.73_m2} (ref >=60)
GFR, Est Non African American: 85 mL/min/{1.73_m2} (ref >=60)
Globulin: 2.5 g/dL (calc) (ref 1.9–3.7)
Glucose, Bld: 89 mg/dL (ref 65–99)
Potassium: 4.7 mmol/L (ref 3.5–5.3)
Sodium: 139 mmol/L (ref 135–146)
Total Bilirubin: 0.4 mg/dL (ref 0.2–1.2)
Total Protein: 6.2 g/dL (ref 6.1–8.1)

## 2020-05-16 LAB — CBC WITH DIFFERENTIAL/PLATELET
Absolute Monocytes: 645 cells/uL (ref 200–950)
Basophils Absolute: 38 cells/uL (ref 0–200)
Basophils Relative: 0.5 %
Eosinophils Absolute: 360 cells/uL (ref 15–500)
Eosinophils Relative: 4.8 %
HCT: 43 % (ref 38.5–50.0)
Hemoglobin: 14.5 g/dL (ref 13.2–17.1)
Lymphs Abs: 1245 cells/uL (ref 850–3900)
MCH: 31.5 pg (ref 27.0–33.0)
MCHC: 33.7 g/dL (ref 32.0–36.0)
MCV: 93.5 fL (ref 80.0–100.0)
MPV: 10.7 fL (ref 7.5–12.5)
Monocytes Relative: 8.6 %
Neutro Abs: 5213 cells/uL (ref 1500–7800)
Neutrophils Relative %: 69.5 %
Platelets: 210 10*3/uL (ref 140–400)
RBC: 4.6 10*6/uL (ref 4.20–5.80)
RDW: 14.5 % (ref 11.0–15.0)
Total Lymphocyte: 16.6 %
WBC: 7.5 10*3/uL (ref 3.8–10.8)

## 2020-05-16 LAB — SEDIMENTATION RATE: Sed Rate: 19 mm/h (ref 0–20)

## 2020-05-16 MED FILL — METHOTREXATE SODIUM 2.5 MG: 2.5 | 28 days supply | Qty: 24 | Fill #0

## 2020-05-16 NOTE — Progress Notes (Signed)
Labs look okay no problems related to the methotrexate. Inflammatory markers are still normal today despite the increase in joint pain and swelling, but hopefully steroid treatment will improve these symptoms since they look inflammatory.

## 2020-05-16 NOTE — Telephone Encounter (Signed)
Last Visit: 05/15/2020 Next Visit: 06/26/2020 Labs: 05/15/2020 Labs look okay no problems related to the methotrexate.   Current Dose per office note 05/15/2020: Take 6 tablets (15 mg total) by mouth once a week.  DX: Polymyalgia rheumatica  Okay to refill MTX?

## 2020-05-16 NOTE — Telephone Encounter (Signed)
Patient's wife Garrett Munoz called checking the status of Garrett Munoz's prescription of Methotrexate.  Garrett Munoz states she spoke with the pharmacist at [facility] and was told they haven't received the prescription.  Garrett Munoz requested a return call.

## 2020-05-16 NOTE — Telephone Encounter (Signed)
fyi

## 2020-05-26 ENCOUNTER — Encounter: Payer: Self-pay | Admitting: Cardiovascular Disease

## 2020-05-26 ENCOUNTER — Other Ambulatory Visit: Payer: Self-pay | Admitting: Cardiovascular Disease

## 2020-05-26 ENCOUNTER — Ambulatory Visit: Payer: PPO | Admitting: Cardiovascular Disease

## 2020-05-26 ENCOUNTER — Other Ambulatory Visit: Payer: Self-pay

## 2020-05-26 VITALS — BP 140/80 | HR 67 | Ht 71.0 in | Wt 319.0 lb

## 2020-05-26 DIAGNOSIS — E662 Morbid (severe) obesity with alveolar hypoventilation: Secondary | ICD-10-CM

## 2020-05-26 DIAGNOSIS — I10 Essential (primary) hypertension: Secondary | ICD-10-CM | POA: Diagnosis not present

## 2020-05-26 DIAGNOSIS — I4821 Permanent atrial fibrillation: Secondary | ICD-10-CM

## 2020-05-26 DIAGNOSIS — I5032 Chronic diastolic (congestive) heart failure: Secondary | ICD-10-CM

## 2020-05-26 DIAGNOSIS — G4733 Obstructive sleep apnea (adult) (pediatric): Secondary | ICD-10-CM

## 2020-05-26 DIAGNOSIS — Z7901 Long term (current) use of anticoagulants: Secondary | ICD-10-CM

## 2020-05-26 MED ORDER — LOSARTAN POTASSIUM 25 MG PO TABS: 25.0000 mg | ORAL_TABLET | Freq: Every day | ORAL | 3 refills | Status: DC

## 2020-05-26 MED FILL — LOSARTAN POTASSIUM 25 MG TA: 25 | 30 days supply | Qty: 30 | Fill #0

## 2020-05-26 NOTE — Patient Instructions (Signed)
Medication Instructions:  START Losartan 25 mg daily  *If you need a refill on your cardiac medications before your next appointment, please call your pharmacy*  Follow-Up: At [facility], you and your health needs are our priority.  As part of our continuing mission to provide you with exceptional heart care, we have created designated Provider Care Teams.  These Care Teams include your primary Cardiologist (physician) and Advanced Practice Providers (APPs -  Physician Assistants and Nurse Practitioners) who all work together to provide you with the care you need, when you need it.  We recommend signing up for the patient portal called "MyChart".  Sign up information is provided on this After Visit Summary.  MyChart is used to connect with patients for Virtual Visits (Telemedicine).  Patients are able to view lab/test results, encounter notes, upcoming appointments, etc.  Non-urgent messages can be sent to your provider as well.   To learn more about what you can do with MyChart, go to [email or web address].    Your next appointment:   6 month(s)  The format for your next appointment:   In Person  Provider:   Shelva Majestic, MD

## 2020-05-26 NOTE — Progress Notes (Signed)
Patient ID: Garrett Munoz, male   DOB: 10/17/1945, 75 y.o.   MRN: [id number]     HPI: Garrett Munoz is a 75 y.o. male who presents to the office today for a 15 month follow-up evaluation.  Garrett Munoz has a history of hypertension, and remotely had taken blood pressure medications for several years but none since the last 20 years. He has a history of prior hypertension, obstructive sleep apnea, untreated due to previous intolerance to full face mask, as well as obesity.  He admits to having an intermittent irregular rhythm.  He was scheduled to undergo elective surgery on his right foot hammertoe by Dr. Fritzi Mandes.  Surgery was canceled due to concerns for possible Mobitz type II block with possible atrial flutter.  He was seen by me for preoperative evaluation on 01/30/2015.  At that time, his ECG demonstrated atrial fibrillation with ventricular rate at approximately 100 bpm.  He was noted have small Q wave in lead 3.  He was started on Toprol 25 mg for 4 days and this was titrated up to 50 mg.  He also was started on eloquence 5 mg twice a day for anticoagulation.  A 2-D echo Doppler study on February 18 2015 revealed an ejection fraction of 55-60%.  There was moderate left ventricular hypertrophy.  The left atrium was moderately dilated.  A nuclear perfusion study done on 02/13/2015 was low risk without ischemia with only a mild apical defect, probably attenuation. He denies any chest pain.  He denies shortness of breath.  He is been unable to walk secondary to his toe abnormality.  He admits to daytime sleepiness and snoring.  He had not utilized CPAP therapy in years.  He is status post cataract surgery with lens implant.    He underwent cardioversion on March 04, 2015 and was successfully converted back to sinus rhythm.  On 04/10/2015 he was admitted with community-acquired pneumonia and was found to be back in atrial fibrillation.  He denies any episodes of chest pain. He has been on eliquis 5  mg twice a day for anticoagulation , carvedilol 6.25 mg twice a day.  He is unaware of his rhythm being abnormal. He is not sleeping well but has not been using his CPAP. He feels fatigued.  When I saw him in March 2017 he continued to be in atrial fibrillation.  After much discussion, concerning another attempt at trying to convert into sinus rhythm versus staying in permanent atrial fibrillation he opted to stay in permanent atrial fibrillation.  He has continued to be on anticoagulation therapy.  I saw him in October 2018 he continued to have difficulty with low back pain as well as swelling in his hands due to arthritis.  He sees Dr. Dennard Schaumann for primary care.  He denied any recent episodes of chest pain.  He denies palpitations.  He has not been successful with weight loss and his BMI has increased up to 42.3.  Laboratory in April 2018 had shown glucose increased at 107.  Lipid studies revealed a cholesterol of 134, triglycerides 103, HDL 50, and LDL 63.    I  saw him in November 2019.  At that time, he was not using CPAP.  He was not sleeping well and often would have to take naps.  He is atrial fibrillation rate was controlled on carvedilol 18.75 mg in the morning and 12.5 mg in the evening and he continued to be on Eliquis without bleeding.  During that evaluation we  discussed the possibility of reevaluating his sleep apnea.  I discussed new mask technology however he opted against doing this.  I last saw him in November 2020 and over the prior year he denied any recurrent chest pain.  He was having difficulty with low back discomfort which was limiting his walking.  He also underwent carpal tunnel surgery of his right hand.  He admits to some weight gain.  He is not been exercising during this Covid 19 pandemic.  He had laboratory in May 2020.  TSH was 0.88.  Hemoglobin hematocrit were stable at 16.8 and 48.1.  Glucose was 99.  Renal function was normal with a BUN of 13 and a creatinine of 0.88.  LFTs  were normal.  Lipid studies in 2019 showed a total cholesterol 138 LDL cholesterol 70 HDL 53 and triglycerides 73.   Since I last saw him, he was hospitalized in November 2021 with CHF exacerbation.  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  He had not been using his CPAP and apparently in the hospital did not like BiPAP.  An echo Doppler study on March 09, 2020 showed an EF at 60 to 65%.  There was severe LVH.  Diastolic function could not be assessed.  He had mild to moderate left atrial dilatation.  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  Following his hospitalization he was evaluated by Dr. Halford Chessman and he has been scheduled for a subsequent home sleep study to reassess his previously diagnosed and untreated sleep apnea.  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 Vernelle Emerald.  He has been on methotrexate weekly.  He presents for follow-up cardiology evaluation.  Past Medical History:  Diagnosis Date   Atrial fibrillation ([place])    Bronchitis    hx   Cataract    right eye   CHF NYHA class III, acute on chronic, diastolic (HCC)    COPD (chronic obstructive pulmonary disease) (HCC)    DDD (degenerative disc disease), lumbosacral    Dysrhythmia    irregular heatbeat   Eye worm    right eye   Gout    History of kidney stones    Hypertension    Left knee DJD    Morbid obesity with BMI of 40.0-44.9, adult (HCC)    Pneumonia    hx   Prediabetes    Radicular syndrome of left leg    S/P total knee replacement    right   Sleep apnea    can't use sleep study 10 yrs ago   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     Past Surgical History:  Procedure Laterality Date   CARDIOVERSION N/A 03/04/2015   Procedure: CARDIOVERSION;  Surgeon: [name], MD;  Location: [facility];  Service: Cardiovascular;  Laterality: N/A;   CHOLECYSTECTOMY      COLONOSCOPY N/A 01/01/2016   Procedure: COLONOSCOPY;  Surgeon: [name], MD;  [facility];  Service: Endoscopy;  Laterality: N/A;  1:45   COLONOSCOPY N/A 01/14/2017   Procedure: COLONOSCOPY;  Surgeon: [name], MD;  [facility];  Service: Endoscopy;  Laterality: N/A;  12:05   EYE EXAMINATION UNDER ANESTHESIA W/ RETINAL CRYOTHERAPY AND RETINAL LASER  07/2011   EYE SURGERY  02/2011   cat rt   KNEE ARTHROSCOPY     bilateral   LUMBAR LAMINECTOMY/DECOMPRESSION MICRODISCECTOMY  N/A 08/18/2017   Procedure: Bilateral microlumbar decompression Lumbar four-Lumbar five;  Surgeon: [name], MD;  Location: [facility];  Service: Orthopedics;  Laterality: N/A;   POLYPECTOMY  01/01/2016   Procedure: POLYPECTOMY;  Surgeon: [name], MD;  [facility];  Service: Endoscopy;;  colon   RETINAL DETACHMENT SURGERY  03/2011   rt   TOTAL KNEE ARTHROPLASTY  2003   rt   TOTAL KNEE ARTHROPLASTY  02/07/2012   Procedure: TOTAL KNEE ARTHROPLASTY;  Surgeon: [name], MD;  Location: [facility];  Service: Orthopedics;  Laterality: Left;    No Known Allergies  Current Outpatient Medications  Medication Sig Dispense Refill   allopurinol (ZYLOPRIM) 300 MG tablet TAKE 1 TABLET (300 MG TOTAL) BY MOUTH DAILY. 90 tablet 3   apixaban (ELIQUIS) 5 MG TABS tablet Take 1 tablet (5 mg total) by mouth 2 (two) times daily. 180 tablet 1   carvedilol (COREG) 12.5 MG tablet TAKE 1 & 1/2 TABLETS BY MOUTH EVERY MORNING AND 1 TABLET IN THE EVENING 225 tablet 3   fluticasone (FLONASE) 50 MCG/ACT nasal spray SHAKE LIQUID AND USE 2 SPRAYS IN EACH NOSTRIL DAILY (Patient taking differently: Place 2 sprays into both nostrils daily as needed for allergies or rhinitis.) 16 g 6   folic acid (FOLVITE) 1 MG tablet Take 1 tablet (1 mg total) by mouth daily. 90 tablet 0   furosemide (LASIX) 80 MG tablet Take 1 tablet (80 mg total) by mouth daily. 30 tablet 3   gabapentin  (NEURONTIN) 300 MG capsule Take 2 capsules (600 mg total) by mouth 3 (three) times daily. 540 capsule 3   HYDROcodone-acetaminophen (NORCO) 10-325 MG tablet Take 1 tablet by mouth 3 (three) times daily as needed for pain.     losartan (COZAAR) 25 MG tablet Take 1 tablet (25 mg total) by mouth daily. 90 tablet 3   methotrexate (RHEUMATREX) 2.5 MG tablet Take 6 tablets (15 mg total) by mouth once a week. Caution:Chemotherapy. Protect from light. 24 tablet 1   Multiple Vitamins-Minerals (MULTIVITAMIN WITH MINERALS) tablet Take 1 tablet by mouth daily. 120 tablet 2   predniSONE (DELTASONE) 5 MG tablet Take 1 tablet (5 mg total) by mouth daily with breakfast. 60 tablet 0   temazepam (RESTORIL) 30 MG capsule TAKE 1 CAPSULE BY MOUTH AT BEDTIME AS NEEDED FOR SLEEP. 30 capsule 2   Current Facility-Administered Medications  Medication Dose Route Frequency Provider Last Rate Last Admin   methylPREDNISolone acetate (DEPO-MEDROL) injection 40 mg  40 mg Intramuscular Once Rice, Resa Miner, MD        Social History   Socioeconomic History   Marital status: Married    Spouse name: Not on file   Number of children: Not on file   Years of education: Not on file   Highest education level: Not on file  Occupational History   Not on file  Tobacco Use   Smoking status: Former Smoker    Packs/day: 2.00    Years: 15.00    Pack years: 30.00    Types: Cigarettes    Quit date: 02/02/1992    Years since quitting: 28.3   Smokeless tobacco: Never Used   Tobacco comment: occ alcohol  Vaping Use   Vaping Use: Never used  Substance and Sexual Activity   Alcohol use: Yes    Alcohol/week: 0.0 standard drinks    Comment: occasional   Drug use: No   Sexual activity: Not on file  Other Topics Concern  Not on file  Social History Narrative   Not on file   Social Determinants of Health   Financial Resource Strain: Low Risk    Difficulty of Paying Living Expenses: Not very hard   Food Insecurity: Not on file  Transportation Needs: Not on file  Physical Activity: Not on file  Stress: Not on file  Social Connections: Not on file  Intimate Partner Violence: Not on file   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  He is married.   Family History  Problem Relation Age of Onset   Lung cancer Father    Family history is notable that his father died at 61 with lung CA and his mother died at 12.  She had an irregular heart rhythm.  He has 2 sisters and one is deceased secondary to cancer and 1 brother.  ROS General: Negative; No fevers, chills, or night sweats.  Positive for morbid obesity. HEENT: He has a right eye lens  implant after cataract surgery No changes in vision or hearing, sinus congestion, difficulty swallowing Pulmonary: Negative; No cough, wheezing, shortness of breath, hemoptysis Cardiovascular: See HPI:  GI: Negative; No nausea, vomiting, diarrhea, or abdominal pain GU: Negative; No dysuria, hematuria, or difficulty voiding Musculoskeletal: History of remote right total knee replacement and left total knee replacement.  Neck low back discomfort.  Status post right carpal tunnel surgery recent low back pain from lumbar spine; positive for polymyalgia rheumatica Hematologic: Negative; no easy bruising, bleeding Endocrine: Negative; no heat/cold intolerance; no diabetes, Neuro: Negative; no changes in balance, headaches Skin: Negative; No rashes or skin lesions Psychiatric: Negative; No behavioral problems, depression Sleep: Positive for untreated sleep apnea; positive for snoring and hypersomnolence. Other comprehensive 14 point system review is negative   Physical Exam BP 140/80 (BP Location: Right Arm, Patient Position: Sitting)    Pulse 67    Ht '5\' 11"'  (1.803 m)    Wt (!) 319 lb (144.7 kg)    BMI 44.49 kg/m    Repeat blood pressure by me 132/83  Wt Readings from Last 3 Encounters:  05/26/20 (!)  319 lb (144.7 kg)  05/15/20 (!) 328 lb (148.8 kg)  04/25/20 (!) 307 lb (139.3 kg)   General: Alert, oriented, no distress.  Morbidly obese Skin: normal turgor, no rashes, warm and dry HEENT: Normocephalic, atraumatic. Pupils equal round and reactive to light; sclera anicteric; extraocular muscles intact;  Nose without nasal septal hypertrophy Mouth/Parynx benign; Mallinpatti scale 3 Neck: Thick neck; no JVD, no carotid bruits; normal carotid upstroke Lungs: clear to ausculatation and percussion; no wheezing or rales Chest wall: without tenderness to palpitation Heart: PMI not displaced, irregularly irregular with a controlled ventricular rate, s1 s2 normal, 1/6 systolic murmur, no diastolic murmur, no rubs, gallops, thrills, or heaves Abdomen: soft, nontender; no hepatosplenomehaly, BS+; abdominal aorta nontender and not dilated by palpation. Back: no CVA tenderness Pulses 2+ Musculoskeletal: full range of motion, normal strength, no joint deformities Extremities: no clubbing cyanosis or edema, Homan's sign negative  Neurologic: grossly nonfocal; Cranial nerves grossly wnl Psychologic: Normal mood and affect   ECG (independently read by me): Atrial fibrillation at 67, IRBBB; QTc 399 msec  November 2020 ECG (independently read by me): Atrial Fibrillation at 63  November 2019 ECG (independently read by me): Atrial fibrillation 81 bpm.  QTc interval 422 ms.  Early transition.  October 2018 ECG (independently read by me): Atrial fibrillation with ventricular rate at 79  bpm.  March 2018 ECG (independently read by me): Atrial fibrillation at 72 bpm with PVC.  September 2017 ECG (independently read by me): Atrial fibrillation with ventricular rate at 10 2 bpm.  Occasional unifocal PVCs versus a bare complex.  March 2017 ECG (independently read by me):  Atrial fibrillation at 83 bpm.  02/21/2015 ECG (independently read by me): Atrial fibrillation at 99 bpm.  01/30/2015 ECG  (independently read by me): Atrial fibrillation at a approximately 90-100 bpm.  Small Q wave in lead 3.  Early transition.  LABS:  BMP Latest Ref Rng & Units 05/15/2020 03/31/2020 03/24/2020  Glucose 65 - 99 mg/dL 89 104(H) 108(H)  BUN 7 - 25 mg/dL '13 14 12  ' Creatinine 0.70 - 1.18 mg/dL 0.84 0.77 0.87  BUN/Creat Ratio 6 - 22 (calc) NOT APPLICABLE NOT APPLICABLE NOT APPLICABLE  Sodium 354 - 146 mmol/L 139 136 140  Potassium 3.5 - 5.3 mmol/L 4.7 3.5 3.4(L)  Chloride 98 - 110 mmol/L 100 94(L) 93(L)  CO2 20 - 32 mmol/L 34(H) 31 36(H)  Calcium 8.6 - 10.3 mg/dL 9.0 8.7 8.9    Hepatic Function Latest Ref Rng & Units 05/15/2020 03/31/2020 03/09/2020  Total Protein 6.1 - 8.1 g/dL 6.2 6.1 6.4(L)  Albumin 3.5 - 5.0 g/dL - - 3.0(L)  AST 10 - 35 U/L '18 15 21  ' ALT 9 - 46 U/L '18 12 19  ' Alk Phosphatase 38 - 126 U/L - - 57  Total Bilirubin 0.2 - 1.2 mg/dL 0.4 0.9 0.6  Bilirubin, Direct 0.1 - 0.5 mg/dL - - -    CBC Latest Ref Rng & Units 05/15/2020 03/31/2020 03/24/2020  WBC 3.8 - 10.8 Thousand/uL 7.5 7.1 8.0  Hemoglobin 13.2 - 17.1 g/dL 14.5 14.2 15.0  Hematocrit 38.5 - 50.0 % 43.0 41.7 43.6  Platelets 140 - 400 Thousand/uL 210 195 231   Lab Results  Component Value Date   MCV 93.5 05/15/2020   MCV 92.1 03/31/2020   MCV 92.4 03/24/2020    Lab Results  Component Value Date   TSH 0.88 08/24/2018    BNP    Component Value Date/Time   BNP 261.1 (H) 03/09/2020 1228   BNP 102 (H) 06/04/2019 1026    ProBNP No results found for: PROBNP   Lipid Panel     Component Value Date/Time   CHOL 122 06/04/2019 1026   CHOL 147 12/05/2015 0901   TRIG 66 06/04/2019 1026   HDL 52 06/04/2019 1026   HDL 55 12/05/2015 0901   CHOLHDL 2.3 06/04/2019 1026   VLDL 21 07/23/2016 0805   LDLCALC 55 06/04/2019 1026     RADIOLOGY: No results found.  IMPRESSION:  1. Essential hypertension   2. Permanent atrial fibrillation ([place])   3. Chronic anticoagulation - Eliquis, CHADS2VASC=2   4. OSA  (obstructive sleep apnea)   5. Obesity hypoventilation syndrome ([place])   6. Morbid obesity, unspecified obesity type ([place])   7. Chronic diastolic heart failure (HCC)     ASSESSMENT AND PLAN: Mr. Kimsey Demaree is a 75 year-old gentleman who has a history of morbid obesity, hypertension, obstructive sleep apnea, and permanent atrial fibrillation.  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  An echo Doppler study showed an EF demonstrated normal systolic function but he had severe left ventricular hypertrophy.  I suspect he had diastolic heart failure.  He was noted to have mild dilatation of his aortic  root and ascending aorta.  He had mild to moderate left atrial dilatation.  He has been on chronic anticoagulation with Eliquis currently at 5 mg twice a day.  Atrial fibrillation rate is controlled today on carvedilol 18.75 mg in the morning and 12.5 mg at night and he continues to be on furosemide 40 mg daily.  His blood pressure today is elevated and with his diastolic dysfunction I have recommended initiation of losartan we will start this at low-dose 25 mg.  He will be seeing Dr. Dennard Schaumann in several weeks and depending upon his response this can be further titrated as needed.  In the past he had sleep apnea and refuses treatment.  He is now scheduled per Dr. Halford Chessman for a follow-up home sleep study for reassessment.  He was diagnosed with polymyalgia rheumatica and is now on methotrexate weekly and also is on prednisone 5 mg daily.  He continues to be morbidly obese with a BMI of 44.5.  Weight loss was recommended.  I will see him in 6 months for reevaluation or sooner as needed.   [name], MD, [facility]  05/28/2020 1:22 PM

## 2020-05-28 ENCOUNTER — Encounter: Payer: Self-pay | Admitting: Cardiovascular Disease

## 2020-06-02 MED FILL — FUROSEMIDE 80 MG TAB: 80 | 30 days supply | Qty: 30 | Fill #2

## 2020-06-02 MED FILL — predniSONE 5 MG TABS: 5 | 60 days supply | Qty: 60 | Fill #0

## 2020-06-03 ENCOUNTER — Ambulatory Visit (INDEPENDENT_AMBULATORY_CARE_PROVIDER_SITE_OTHER): Payer: PPO | Admitting: Internal Medicine

## 2020-06-03 MED FILL — TEMAZEPAM 30 MG CAPSULE: 30 | 30 days supply | Qty: 30 | Fill #2

## 2020-06-09 ENCOUNTER — Encounter: Payer: Self-pay | Admitting: Family Medicine

## 2020-06-09 ENCOUNTER — Ambulatory Visit (INDEPENDENT_AMBULATORY_CARE_PROVIDER_SITE_OTHER): Payer: PPO | Admitting: Family Medicine

## 2020-06-09 ENCOUNTER — Other Ambulatory Visit: Payer: Self-pay

## 2020-06-09 VITALS — BP 120/72 | HR 70 | Temp 98.7°F | Resp 17 | Wt 319.0 lb

## 2020-06-09 DIAGNOSIS — Z0000 Encounter for general adult medical examination without abnormal findings: Secondary | ICD-10-CM

## 2020-06-09 DIAGNOSIS — Z0001 Encounter for general adult medical examination with abnormal findings: Secondary | ICD-10-CM

## 2020-06-09 DIAGNOSIS — M353 Polymyalgia rheumatica: Secondary | ICD-10-CM | POA: Diagnosis not present

## 2020-06-09 DIAGNOSIS — Z125 Encounter for screening for malignant neoplasm of prostate: Secondary | ICD-10-CM | POA: Diagnosis not present

## 2020-06-09 DIAGNOSIS — I509 Heart failure, unspecified: Secondary | ICD-10-CM | POA: Diagnosis not present

## 2020-06-09 DIAGNOSIS — I4819 Other persistent atrial fibrillation: Secondary | ICD-10-CM | POA: Diagnosis not present

## 2020-06-09 DIAGNOSIS — E7800 Pure hypercholesterolemia, unspecified: Secondary | ICD-10-CM | POA: Diagnosis not present

## 2020-06-09 DIAGNOSIS — G4733 Obstructive sleep apnea (adult) (pediatric): Secondary | ICD-10-CM | POA: Diagnosis not present

## 2020-06-09 LAB — LIPID PANEL
Cholesterol: 136 mg/dL (ref <200)
HDL: 62 mg/dL (ref >=40)
LDL Cholesterol (Calc): 60 mg/dL (calc)
Non-HDL Cholesterol (Calc): 74 mg/dL (calc) (ref <130)
Total CHOL/HDL Ratio: 2.2 (calc) (ref <5.0)
Triglycerides: 64 mg/dL (ref <150)

## 2020-06-09 LAB — PSA: PSA: 0.91 ng/mL (ref <=4.0)

## 2020-06-09 NOTE — Progress Notes (Signed)
Subjective:    Patient ID: Garrett Munoz, male    DOB: [birth year], 75 y.o.   MRN: [id number]  HPI Patient is a very pleasant 75 year old Caucasian male who is here today for complete physical exam.  Past medical history significant for persistent atrial fibrillation currently on Eliquis.  He also has a history of congestive heart failure and he is on a beta-blocker, carvedilol, as well as an angiotensin receptor blocker losartan.  He has recently been hospitalized due to pulmonary edema and fluid overload and is now taking furosemide.  He does have trace bipedal edema but his lungs are clear to auscultation bilaterally and he denies any orthopnea or paroxysmal nocturnal dyspnea or shortness of breath with activity.  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  Recently had lab work in January that included a normal CBC, normal CMp.  He is due for a PSA to screen for prostate cancer.  He is also due for a fasting lipid panel.  He denies any falls, depression, or memory loss.  He does have hearing loss and has hearing aids at home but he refuses to wear them because he has a difficult time keeping them in his ear. Immunization History  Administered Date(s) Administered  . Fluad Quad(high Dose 65+) 12/20/2018, 01/09/2020  . Influenza, High Dose Seasonal PF 01/04/2017  . Influenza,inj,Quad PF,6+ Mos 01/04/2018  . Influenza-Unspecified 01/18/2016, 01/24/2017  . Moderna Sars-Covid-2 Vaccination 05/01/2019, 05/28/2019, 12/25/2019  . Pneumococcal Conjugate-13 01/04/2017, 02/17/2017  . Pneumococcal-Unspecified 02/17/2017  . Zoster Recombinat (Shingrix) 12/07/2017, 02/13/2018    Past Medical History:  Diagnosis Date  . Atrial fibrillation ([place])   . Bronchitis    hx  . Cataract    right eye  . CHF NYHA class III, acute on chronic, diastolic ([place])   . COPD  (chronic obstructive pulmonary disease) ([place])   . DDD (degenerative disc disease), lumbosacral   . Dysrhythmia    irregular heatbeat  . Eye worm    right eye  . Gout   . History of kidney stones   . Hypertension   . Left knee DJD   . Morbid obesity with BMI of 40.0-44.9, adult ([place])   . Pneumonia    hx  . Prediabetes   . Radicular syndrome of left leg   . S/P total knee replacement    right  . Sleep apnea    can't use sleep study 10 yrs ago  . 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    Past Surgical History:  Procedure Laterality Date  . CARDIOVERSION N/A 03/04/2015   Procedure: CARDIOVERSION;  Surgeon: [name], MD;  Location: [place];  Service: Cardiovascular;  Laterality: N/A;  . CHOLECYSTECTOMY    . COLONOSCOPY N/A 01/01/2016   Procedure: COLONOSCOPY;  Surgeon: [name], MD;  [facility];  Service: Endoscopy;  Laterality: N/A;  1:45  . COLONOSCOPY N/A 01/14/2017   Procedure: COLONOSCOPY;  Surgeon: [name], MD;  [facility];  Service: Endoscopy;  Laterality: N/A;  12:05  . EYE EXAMINATION UNDER ANESTHESIA W/ RETINAL CRYOTHERAPY AND RETINAL LASER  07/2011  . EYE SURGERY  02/2011   cat rt  . KNEE ARTHROSCOPY     bilateral  . LUMBAR LAMINECTOMY/DECOMPRESSION MICRODISCECTOMY N/A 08/18/2017   Procedure: Bilateral microlumbar decompression Lumbar four-Lumbar five;  Surgeon: [name], MD;  Location:  [facility];  Service: Orthopedics;  Laterality: N/A;  . POLYPECTOMY  01/01/2016   Procedure: POLYPECTOMY;  Surgeon: [name], MD;  [facility];  Service: Endoscopy;;  colon  . RETINAL DETACHMENT SURGERY  03/2011   rt  . TOTAL KNEE ARTHROPLASTY  2003   rt  . TOTAL KNEE ARTHROPLASTY  02/07/2012   Procedure: TOTAL KNEE ARTHROPLASTY;  Surgeon: [name], MD;  Location: [place];  Service: Orthopedics;  Laterality: Left;   Current Outpatient Medications on File Prior to Visit  Medication Sig Dispense Refill  . allopurinol  (ZYLOPRIM) 300 MG tablet TAKE 1 TABLET (300 MG TOTAL) BY MOUTH DAILY. 90 tablet 3  . apixaban (ELIQUIS) 5 MG TABS tablet Take 1 tablet (5 mg total) by mouth 2 (two) times daily. 180 tablet 1  . carvedilol (COREG) 12.5 MG tablet TAKE 1 & 1/2 TABLETS BY MOUTH EVERY MORNING AND 1 TABLET IN THE EVENING 225 tablet 3  . fluticasone (FLONASE) 50 MCG/ACT nasal spray SHAKE LIQUID AND USE 2 SPRAYS IN EACH NOSTRIL DAILY (Patient taking differently: Place 2 sprays into both nostrils daily as needed for allergies or rhinitis.) 16 g 6  . folic acid (FOLVITE) 1 MG tablet Take 1 tablet (1 mg total) by mouth daily. 90 tablet 0  . furosemide (LASIX) 80 MG tablet Take 1 tablet (80 mg total) by mouth daily. 30 tablet 3  . gabapentin (NEURONTIN) 300 MG capsule Take 2 capsules (600 mg total) by mouth 3 (three) times daily. 540 capsule 3  . HYDROcodone-acetaminophen (NORCO) 10-325 MG tablet Take 1 tablet by mouth 3 (three) times daily as needed for pain.    [name] losartan (COZAAR) 25 MG tablet Take 1 tablet (25 mg total) by mouth daily. 90 tablet 3  . methotrexate (RHEUMATREX) 2.5 MG tablet Take 6 tablets (15 mg total) by mouth once a week. Caution:Chemotherapy. Protect from light. 24 tablet 1  . Multiple Vitamins-Minerals (MULTIVITAMIN WITH MINERALS) tablet Take 1 tablet by mouth daily. 120 tablet 2  . predniSONE (DELTASONE) 5 MG tablet Take 1 tablet (5 mg total) by mouth daily with breakfast. 60 tablet 0  . temazepam (RESTORIL) 30 MG capsule TAKE 1 CAPSULE BY MOUTH AT BEDTIME AS NEEDED FOR SLEEP. 30 capsule 2   Current Facility-Administered Medications on File Prior to Visit  Medication Dose Route Frequency Provider Last Rate Last Admin  . methylPREDNISolone acetate (DEPO-MEDROL) injection 40 mg  40 mg Intramuscular Once Rice, Resa Miner, MD       No Known Allergies Social History   Socioeconomic History  . Marital status: Married    Spouse name: Not on file  . Number of children: Not on file  . Years of  education: Not on file  . Highest education level: Not on file  Occupational History  . Not on file  Tobacco Use  . Smoking status: Former Smoker    Packs/day: 2.00    Years: 15.00    Pack years: 30.00    Types: Cigarettes    Quit date: 02/02/1992    Years since quitting: 28.3  . Smokeless tobacco: Never Used  . Tobacco comment: occ alcohol  Vaping Use  . Vaping Use: Never used  Substance and Sexual Activity  . Alcohol use: Yes    Alcohol/week: 0.0 standard drinks    Comment: occasional  . Drug use: No  . Sexual activity: Not on file  Other Topics Concern  . Not on file  Social History Narrative  . Not  on file   Social Determinants of Health   Financial Resource Strain: Low Risk   . Difficulty of Paying Living Expenses: Not very hard  Food Insecurity: Not on file  Transportation Needs: Not on file  Physical Activity: Not on file  Stress: Not on file  Social Connections: Not on file  Intimate Partner Violence: Not on file      Review of Systems  All other systems reviewed and are negative.      Objective:   Physical Exam Vitals reviewed.  Constitutional:      General: He is not in acute distress.    Appearance: He is well-developed. He is not diaphoretic.  HENT:     Head: Normocephalic and atraumatic.     Right Ear: Tympanic membrane, ear canal and external ear normal. There is no impacted cerumen.     Left Ear: Tympanic membrane, ear canal and external ear normal. There is no impacted cerumen.     Nose: Nose normal. No congestion or rhinorrhea.     Mouth/Throat:     Pharynx: No oropharyngeal exudate or posterior oropharyngeal erythema.  Eyes:     General: No scleral icterus.       Right eye: No discharge.        Left eye: No discharge.     Extraocular Movements: Extraocular movements intact.     Conjunctiva/sclera: Conjunctivae normal.     Pupils: Pupils are equal, round, and reactive to light.  Neck:     Thyroid: No thyromegaly.     Vascular: No  carotid bruit or JVD.     Trachea: No tracheal deviation.  Cardiovascular:     Rate and Rhythm: Normal rate. Rhythm irregular.     Heart sounds: Normal heart sounds. No murmur heard. No friction rub. No gallop.   Pulmonary:     Effort: Pulmonary effort is normal.     Breath sounds: No wheezing, rhonchi or rales.  Chest:     Chest wall: No tenderness.  Abdominal:     General: Bowel sounds are normal.     Palpations: Abdomen is soft. There is no mass.     Tenderness: There is no abdominal tenderness. There is no guarding or rebound.  Musculoskeletal:        General: No deformity.     Cervical back: Neck supple. No rigidity or tenderness.     Lumbar back: Tenderness present. No deformity. Decreased range of motion.     Right lower leg: Edema present.     Left lower leg: Edema present.  Lymphadenopathy:     Cervical: No cervical adenopathy.  Skin:    Coloration: Skin is not jaundiced or pale.     Findings: No bruising, erythema, lesion or rash.  Neurological:     General: No focal deficit present.     Mental Status: He is alert and oriented to person, place, and time.     Cranial Nerves: No cranial nerve deficit.     Sensory: No sensory deficit.     Motor: No weakness or abnormal muscle tone.     Coordination: Coordination normal.     Gait: Gait normal.     Deep Tendon Reflexes: Reflexes normal.  Psychiatric:        Mood and Affect: Mood normal.        Behavior: Behavior normal.        Thought Content: Thought content normal.        Judgment: Judgment normal.  Assessment & Plan:  Prostate cancer screening - Plan: PSA  Pure hypercholesterolemia - Plan: Lipid panel  PMR (polymyalgia rheumatica) (HCC)  Persistent atrial fibrillation (HCC)  Chronic congestive heart failure, unspecified heart failure type (HCC)  OSA (obstructive sleep apnea)  General medical exam  Patient's last colonoscopy was in 2018.  He was found to have 5 hyperplastic polyps.  They  recommended a repeat colonoscopy in 5 years which would be 2023.  I will screen for prostate cancer with a PSA.  His blood pressure today is well controlled.  He is in atrial fibrillation but his heart rate is controlled on carvedilol and he is appropriately anticoagulated on Eliquis.  His recent CMP and CBC were excellent but I will check a fasting lipid panel.  Ideally I like his LDL cholesterol to be less than 100.  I reviewed his immunizations and aside from a tetanus shot all of his immunizations are up-to-date.  He denies any falls, depression, or memory loss.

## 2020-06-12 MED FILL — METHOTREXATE SODIUM 2.5 MG: 2.5 | 28 days supply | Qty: 24 | Fill #1

## 2020-06-25 NOTE — Progress Notes (Signed)
Office Visit Note  Patient: Garrett Munoz             Date of Birth: 1945-09-09           MRN: [id number]             PCP: [name], MD Referring: [name], MD Visit Date: 06/26/2020   Subjective:  Follow-up (Patient noticed some improvement at first with medication, but symptoms persist. )   History of Present Illness: Garrett Munoz is a 75 y.o. male here for follow up for PMR currently on prednisone 5 mg daily and MTX 15 mg weekly. He is feeling worse than last visit and very dissatisfied with lack of improvement when adding methotrexate and decreasing prednisone. He hurts all day and his shoulders are the worst area but also cannot tightly grip and use his hands due to the pain and stiffness. He has no particular side effects or intolerance besides disliking so many pills.   Review of Systems  Constitutional: Negative for fatigue.  HENT: Positive for mouth dryness. Negative for mouth sores and nose dryness.   Eyes: Negative for pain, itching, visual disturbance and dryness.  Respiratory: Positive for shortness of breath and difficulty breathing. Negative for cough and hemoptysis.   Cardiovascular: Positive for irregular heartbeat. Negative for chest pain, palpitations and swelling in legs/feet.  Gastrointestinal: Negative for abdominal pain, blood in stool, constipation and diarrhea.  Endocrine: Negative for increased urination.  Genitourinary: Negative for painful urination.  Musculoskeletal: Positive for arthralgias, joint pain, myalgias, muscle weakness, morning stiffness, muscle tenderness and myalgias. Negative for joint swelling.  Skin: Negative for color change, rash and redness.  Allergic/Immunologic: Negative for susceptible to infections.  Neurological: Negative for dizziness, numbness, headaches, memory loss and weakness.  Hematological: Negative for swollen glands.  Psychiatric/Behavioral: Positive for sleep disturbance. Negative for confusion.      Previous HPI: Garrett Munoz is a 75 y.o. male here for follow up of probable PMR after recently starting methotrexate last month for treatment of inflammation and goal of tapering prednisone. He has not noticed any trouble or side effects taking the methotrexate.  His pain and stiffness improved almost completely by the time of his primary care follow-up earlier this month.  At that time he discontinued the 5 mg prednisone due to symptoms being well controlled.  This remains well controlled for about a week however in the past 1 week he is having again worsening shoulder and hip and hand pain and stiffness and swelling of his hands bilaterally.  He feels his pain is severe enough to impede sleep and is making daily activities impossible.  PMFS History:  Patient Active Problem List   Diagnosis Date Noted  . Polymyalgia rheumatica ([place]) 04/10/2020  . 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  . High risk medication use 04/10/2020  . CHF NYHA class III, acute on chronic, diastolic ([place])   . Physical deconditioning 03/11/2020  . Prediabetes 03/10/2020  . Acute CHF (congestive heart failure) ([place]) 03/09/2020  . Acute respiratory failure with hypoxia and hypercapnia (HCC)   . Acquired thrombophilia ([place]) 10/19/2019  . Irritable bowel syndrome (IBS) 02/27/2019  . Bilateral carpal tunnel syndrome 08/28/2018  . Eustachian tube dysfunction, left 02/09/2018  . Sensorineural hearing loss (SNHL), 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  . Tinnitus of both ears 02/09/2018  . Spinal stenosis at L4-L5 level 08/18/2017  . Spinal stenosis of lumbar region 07/19/2017  . Degeneration of lumbosacral intervertebral disc 05/28/2017  . Low back pain 05/28/2017  .  Gout 08/24/2016  . S/P total knee replacement 08/24/2016  . Cortical age-related cataract of left eye 08/24/2016  . Occult blood in stools 11/10/2015  . CAP (community acquired pneumonia) 04/10/2015  . Fatigue 04/10/2015  . Hypertensive disorder 04/10/2015  . HCAP  (healthcare-associated pneumonia)   . Persistent atrial fibrillation ([place])   . Anticoagulation adequate 02/21/2015  . Obstructive sleep apnea syndrome 02/01/2015  . Atrial fibrillation ([place]) 02/01/2015  . History of gout 02/01/2015  . Morbid obesity ([place]) 02/01/2015  . Pain in left knee 02/24/2012  . Weakness of left leg 02/24/2012  . Infection by loa loa   . Radicular syndrome of left leg   . Left knee DJD     Past Medical History:  Diagnosis Date  . Atrial fibrillation ([place])   . Bronchitis    hx  . Cataract    right eye  . CHF NYHA class III, acute on chronic, diastolic ([place])   . COPD (chronic obstructive pulmonary disease) ([place])   . DDD (degenerative disc disease), lumbosacral   . Dysrhythmia    irregular heatbeat  . Eye worm    right eye  . Gout   . History of kidney stones   . Hypertension   . Left knee DJD   . Morbid obesity with BMI of 40.0-44.9, adult ([place])   . Pneumonia    hx  . Prediabetes   . Radicular syndrome of left leg   . S/P total knee replacement    right  . Sleep apnea    can't use sleep study 10 yrs ago  . 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     Family History  Problem Relation Age of Onset  . Lung cancer Father    Past Surgical History:  Procedure Laterality Date  . CARDIOVERSION N/A 03/04/2015   Procedure: CARDIOVERSION;  Surgeon: [name], MD;  Location: [facility];  Service: Cardiovascular;  Laterality: N/A;  . CHOLECYSTECTOMY    . COLONOSCOPY N/A 01/01/2016   Procedure: COLONOSCOPY;  Surgeon: [name], MD;  [facility];  Service: Endoscopy;  Laterality: N/A;  1:45  . COLONOSCOPY N/A 01/14/2017   Procedure: COLONOSCOPY;  Surgeon: [name], MD;  [facility];  Service: Endoscopy;  Laterality: N/A;  12:05  . EYE EXAMINATION UNDER ANESTHESIA W/ RETINAL CRYOTHERAPY AND RETINAL LASER  07/2011  . EYE SURGERY  02/2011   cat rt  . KNEE ARTHROSCOPY     bilateral  . LUMBAR LAMINECTOMY/DECOMPRESSION  MICRODISCECTOMY N/A 08/18/2017   Procedure: Bilateral microlumbar decompression Lumbar four-Lumbar five;  Surgeon: [name], MD;  Location: [facility];  Service: Orthopedics;  Laterality: N/A;  . POLYPECTOMY  01/01/2016   Procedure: POLYPECTOMY;  Surgeon: [name], MD;  [facility];  Service: Endoscopy;;  colon  . RETINAL DETACHMENT SURGERY  03/2011   rt  . TOTAL KNEE ARTHROPLASTY  2003   rt  . TOTAL KNEE ARTHROPLASTY  02/07/2012   Procedure: TOTAL KNEE ARTHROPLASTY;  Surgeon: [name], MD;  Location: [facility];  Service: Orthopedics;  Laterality: Left;   Social History   Social History Narrative  . Not on file   Immunization History  Administered Date(s) Administered  . Fluad Quad(high Dose 65+) 12/20/2018, 01/09/2020  . Influenza, High Dose Seasonal PF 01/04/2017  . Influenza,inj,Quad PF,6+ Mos 01/04/2018  . Influenza-Unspecified 01/18/2016, 01/24/2017  . Moderna Sars-Covid-2 Vaccination 05/01/2019, 05/28/2019, 12/25/2019  . Pneumococcal Conjugate-13 01/04/2017, 02/17/2017  . Pneumococcal-Unspecified 02/17/2017  . Zoster  Recombinat (Shingrix) 12/07/2017, 02/13/2018     Objective: Vital Signs: BP 117/82 (BP Location: Left Arm, Patient Position: Sitting, Cuff Size: Normal)   Pulse 96   Ht 5\' 11"  (1.803 m)   Wt (!) 322 lb 6.4 oz (146.2 kg)   BMI 44.97 kg/m    Physical Exam Constitutional:      Appearance: He is obese.  Eyes:     Conjunctiva/sclera: Conjunctivae normal.  Skin:    General: Skin is warm and dry.  Neurological:     Mental Status: He is alert.     Musculoskeletal Exam:  Shoulders painful and give way weakness to abduction from pain, no tenderness to palpation Elbows full ROM no tenderness or swelling Wrists full ROM no tenderness or swelling Fingers tightness over MCP joints with difficulty fully closing grip, bony nodules, no palpable synovitis Hip flexion strength intact but pain with resisted movement Knees full ROM patellofemoral  crepitus no swelling   Investigation: No additional findings.  Imaging: No results found.  Recent Labs: Lab Results  Component Value Date   WBC 7.5 05/15/2020   HGB 14.5 05/15/2020   PLT 210 05/15/2020   NA 139 05/15/2020   K 4.7 05/15/2020   CL 100 05/15/2020   CO2 34 (H) 05/15/2020   GLUCOSE 89 05/15/2020   BUN 13 05/15/2020   CREATININE 0.84 05/15/2020   BILITOT 0.4 05/15/2020   ALKPHOS 57 03/09/2020   AST 18 05/15/2020   ALT 18 05/15/2020   PROT 6.2 05/15/2020   ALBUMIN 3.0 (L) 03/09/2020   CALCIUM 9.0 05/15/2020   GFRAA 99 05/15/2020    Speciality Comments: No specialty comments available.  Procedures:  No procedures performed Allergies: Patient has no known allergies.   Assessment / Plan:     Visit Diagnoses: Polymyalgia rheumatica ([place]) - Plan: predniSONE (DELTASONE) 10 MG tablet, Ambulatory referral to Physical Therapy  Symptoms are worse today despite continuing 5 mg prednisone and now on the methotrexate for almost 3 months. Shoulder pain and stiffness with trouble in overhead abduction and hip flexion does seem consistent with PMR, although he also has generalized pain involving his flank and also bilateral hand pain. Recommend he increase his prednisone back to a 10mg  dose and new Rx sent for this. Plan to check in by phone in 2 wks, if symptoms improve he can try stopping the methotrexate that he feels is not doing anything to see whether this changes symptoms. If no response to increased prednisone could try short term high dose or alternate causes.  Generalized osteoarthritis Radicular pain of left leg and left leg weakness  His pain with positional variation in the left leg getting worse probably reflects deconditioning and worsened joint mobility, secondary to his pain and other symptoms. Referral to physical therapy specific he and his wife are interested about water therapy type option to be more tolerable, if possible.  Orders: Orders Placed This  Encounter  Procedures  . Ambulatory referral to Physical Therapy   Meds ordered this encounter  Medications  . predniSONE (DELTASONE) 10 MG tablet    Sig: Take 1 tablet (10 mg total) by mouth daily with breakfast.    Dispense:  60 tablet    Refill:  0     Follow-Up Instructions: Return in about 2 months (around 08/26/2020) for PMR f/u.   Collier Salina, MD  Note - This record has been created using [employer].  Chart creation errors have been sought, but may not always  have been located.  Such creation errors do not reflect on  the standard of medical care.

## 2020-06-26 ENCOUNTER — Other Ambulatory Visit: Payer: Self-pay | Admitting: Internal Medicine

## 2020-06-26 ENCOUNTER — Ambulatory Visit: Payer: PPO | Admitting: Internal Medicine

## 2020-06-26 ENCOUNTER — Encounter: Payer: Self-pay | Admitting: Family Medicine

## 2020-06-26 ENCOUNTER — Ambulatory Visit (INDEPENDENT_AMBULATORY_CARE_PROVIDER_SITE_OTHER): Payer: PPO | Admitting: Family Medicine

## 2020-06-26 ENCOUNTER — Other Ambulatory Visit: Payer: Self-pay

## 2020-06-26 ENCOUNTER — Encounter: Payer: Self-pay | Admitting: Internal Medicine

## 2020-06-26 VITALS — BP 122/72 | HR 86 | Temp 98.7°F | Resp 16 | Ht 71.0 in | Wt 296.0 lb

## 2020-06-26 VITALS — BP 117/82 | HR 96 | Ht 71.0 in | Wt 322.4 lb

## 2020-06-26 DIAGNOSIS — R29898 Other symptoms and signs involving the musculoskeletal system: Secondary | ICD-10-CM

## 2020-06-26 DIAGNOSIS — M159 Polyosteoarthritis, unspecified: Secondary | ICD-10-CM

## 2020-06-26 DIAGNOSIS — M5410 Radiculopathy, site unspecified: Secondary | ICD-10-CM | POA: Diagnosis not present

## 2020-06-26 DIAGNOSIS — M353 Polymyalgia rheumatica: Secondary | ICD-10-CM | POA: Diagnosis not present

## 2020-06-26 DIAGNOSIS — R109 Unspecified abdominal pain: Secondary | ICD-10-CM

## 2020-06-26 LAB — URINALYSIS, ROUTINE W REFLEX MICROSCOPIC
Bilirubin Urine: NEGATIVE
Glucose, UA: NEGATIVE
Hgb urine dipstick: NEGATIVE
Ketones, ur: NEGATIVE
Leukocytes,Ua: NEGATIVE
Nitrite: NEGATIVE
Protein, ur: NEGATIVE
Specific Gravity, Urine: 1.02 (ref 1.001–1.03)
pH: 6.5 (ref 5.0–8.0)

## 2020-06-26 MED ORDER — LOSARTAN POTASSIUM 25 MG PO TABS: 25.0000 mg | ORAL_TABLET | Freq: Every day | ORAL | 3 refills | Status: DC

## 2020-06-26 MED ORDER — PREDNISONE 10 MG PO TABS: 10.0000 mg | ORAL_TABLET | Freq: Every day | ORAL | 0 refills | Status: DC

## 2020-06-26 MED FILL — LOSARTAN POTASSIUM 25 MG TA: 25 | 30 days supply | Qty: 30 | Fill #0

## 2020-06-26 MED FILL — predniSONE 10 MG TABS: 10 | 60 days supply | Qty: 60 | Fill #0

## 2020-06-26 NOTE — Progress Notes (Signed)
Subjective:    Patient ID: Garrett Munoz, male    DOB: December 14, 1945, 75 y.o.   MRN: [id number]  HPI 4 days ago, the patient developed severe pain in his left flank.  The pain is just sitting in his left flank.  Is not radiating anywhere.  He denies any dysuria.  He does have increased urinary frequency.  He denies any gross hematuria however he states that the pain feels similar to pain he is experienced with kidney stones in the past.  Movement and twisting does not exacerbate the pain.  He has no CVA tenderness today on exam.  He is mildly tender to palpation over the area where he has the pain however I am not able to reproduce the pain with palpation.  He denies any nausea or vomiting or diarrhea.  He denies any melena or hematochezia.  He denies any constipation or fever or chills.  His abdomen is soft nondistended with normal bowel sounds.  The 2 leading suspect somewhat differential diagnosis would be musculoskeletal pain in the back versus possibly nephrolithiasis.  However patient had a CT scan of the abdomen and pelvis in April 2021 that showed no nephrolithiasis. Past Medical History:  Diagnosis Date  . Atrial fibrillation ([place])   . Bronchitis    hx  . Cataract    right eye  . CHF NYHA class III, acute on chronic, diastolic ([place])   . COPD (chronic obstructive pulmonary disease) ([place])   . DDD (degenerative disc disease), lumbosacral   . Dysrhythmia    irregular heatbeat  . Eye worm    right eye  . Gout   . History of kidney stones   . Hypertension   . Left knee DJD   . Morbid obesity with BMI of 40.0-44.9, adult ([place])   . Pneumonia    hx  . Prediabetes   . Radicular syndrome of left leg   . S/P total knee replacement    right  . Sleep apnea    can't use sleep study 10 yrs ago  . 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    Past Surgical History:  Procedure Laterality Date  . CARDIOVERSION N/A 03/04/2015   Procedure: CARDIOVERSION;  Surgeon: [name], MD;  Location: [facility];  Service: Cardiovascular;  Laterality: N/A;  . CHOLECYSTECTOMY    . COLONOSCOPY N/A 01/01/2016   Procedure: COLONOSCOPY;  Surgeon: [name], MD;  [facility];  Service: Endoscopy;  Laterality: N/A;  1:45  . COLONOSCOPY N/A 01/14/2017   Procedure: COLONOSCOPY;  Surgeon: [name], MD;  [facility];  Service: Endoscopy;  Laterality: N/A;  12:05  . EYE EXAMINATION UNDER ANESTHESIA W/ RETINAL CRYOTHERAPY AND RETINAL LASER  07/2011  . EYE SURGERY  02/2011   cat rt  . KNEE ARTHROSCOPY     bilateral  . LUMBAR LAMINECTOMY/DECOMPRESSION MICRODISCECTOMY N/A 08/18/2017   Procedure: Bilateral microlumbar decompression Lumbar four-Lumbar five;  Surgeon: [name], MD;  Location: [facility];  Service: Orthopedics;  Laterality: N/A;  . POLYPECTOMY  01/01/2016   Procedure: POLYPECTOMY;  Surgeon: [name], MD;  [facility];  Service: Endoscopy;;  colon  . RETINAL DETACHMENT SURGERY  03/2011   rt  . TOTAL KNEE ARTHROPLASTY  2003   rt  . TOTAL KNEE ARTHROPLASTY  02/07/2012   Procedure: TOTAL KNEE ARTHROPLASTY;  Surgeon: [name], MD;  Location: [facility];  Service: Orthopedics;  Laterality: Left;   Current Outpatient Medications on  File Prior to Visit  Medication Sig Dispense Refill  . allopurinol (ZYLOPRIM) 300 MG tablet TAKE 1 TABLET (300 MG TOTAL) BY MOUTH DAILY. 90 tablet 3  . apixaban (ELIQUIS) 5 MG TABS tablet Take 1 tablet (5 mg total) by mouth 2 (two) times daily. 180 tablet 1  . carvedilol (COREG) 12.5 MG tablet TAKE 1 & 1/2 TABLETS BY MOUTH EVERY MORNING AND 1 TABLET IN THE EVENING 225 tablet 3  . fluticasone (FLONASE) 50 MCG/ACT nasal spray SHAKE LIQUID AND USE 2 SPRAYS IN EACH NOSTRIL DAILY (Patient taking differently: Place 2 sprays into both nostrils daily as needed for allergies or rhinitis.) 16 g 6  . folic acid (FOLVITE) 1 MG tablet Take 1 tablet (1 mg total) by mouth daily. 90 tablet 0  . gabapentin (NEURONTIN) 300 MG  capsule Take 2 capsules (600 mg total) by mouth 3 (three) times daily. 540 capsule 3  . HYDROcodone-acetaminophen (NORCO) 10-325 MG tablet Take 1 tablet by mouth 3 (three) times daily as needed for pain.    . methotrexate (RHEUMATREX) 2.5 MG tablet Take 6 tablets (15 mg total) by mouth once a week. Caution:Chemotherapy. Protect from light. 24 tablet 1  . Multiple Vitamins-Minerals (MULTIVITAMIN WITH MINERALS) tablet Take 1 tablet by mouth daily. 120 tablet 2  . predniSONE (DELTASONE) 5 MG tablet Take 1 tablet (5 mg total) by mouth daily with breakfast. 60 tablet 0  . temazepam (RESTORIL) 30 MG capsule TAKE 1 CAPSULE BY MOUTH AT BEDTIME AS NEEDED FOR SLEEP. 30 capsule 2  . furosemide (LASIX) 80 MG tablet Take 1 tablet (80 mg total) by mouth daily. (Patient not taking: Reported on 06/26/2020) 30 tablet 3   Current Facility-Administered Medications on File Prior to Visit  Medication Dose Route Frequency Provider Last Rate Last Admin  . methylPREDNISolone acetate (DEPO-MEDROL) injection 40 mg  40 mg Intramuscular Once Rice, Resa Miner, MD       No Known Allergies Social History   Socioeconomic History  . Marital status: Married    Spouse name: Not on file  . Number of children: Not on file  . Years of education: Not on file  . Highest education level: Not on file  Occupational History  . Not on file  Tobacco Use  . Smoking status: Former Smoker    Packs/day: 2.00    Years: 15.00    Pack years: 30.00    Types: Cigarettes    Quit date: 02/02/1992    Years since quitting: 28.4  . Smokeless tobacco: Never Used  . Tobacco comment: occ alcohol  Vaping Use  . Vaping Use: Never used  Substance and Sexual Activity  . Alcohol use: Yes    Alcohol/week: 0.0 standard drinks    Comment: occasional  . Drug use: No  . Sexual activity: Not on file  Other Topics Concern  . Not on file  Social History Narrative  . Not on file   Social Determinants of Health   Financial Resource Strain: Low  Risk   . Difficulty of Paying Living Expenses: Not very hard  Food Insecurity: Not on file  Transportation Needs: Not on file  Physical Activity: Not on file  Stress: Not on file  Social Connections: Not on file  Intimate Partner Violence: Not on file      Review of Systems  All other systems reviewed and are negative.      Objective:   Physical Exam Vitals reviewed.  Constitutional:      General: He is not  in acute distress.    Appearance: He is well-developed. He is not diaphoretic.  HENT:     Right Ear: External ear normal.     Left Ear: External ear normal.     Nose: Nose normal.     Mouth/Throat:     Pharynx: No oropharyngeal exudate.  Neck:     Thyroid: No thyromegaly.     Vascular: No JVD.     Trachea: No tracheal deviation.  Cardiovascular:     Rate and Rhythm: Normal rate. Rhythm irregularly irregular.     Heart sounds: Normal heart sounds.  Pulmonary:     Effort: Pulmonary effort is normal.     Breath sounds: Normal breath sounds. No wheezing, rhonchi or rales.    Chest:     Chest wall: No tenderness.  Abdominal:     General: Bowel sounds are normal. There is no distension.     Palpations: Abdomen is soft. There is no mass.     Tenderness: There is no abdominal tenderness. There is no guarding or rebound.  Musculoskeletal:     Right shoulder: No tenderness. Normal range of motion.     Left shoulder: No tenderness. Normal range of motion.     Left wrist: No swelling, tenderness or bony tenderness. Normal range of motion.     Cervical back: Neck supple.     Lumbar back: Tenderness present. No deformity. Decreased range of motion.     Right hip: No tenderness or bony tenderness. Normal range of motion.     Left hip: No tenderness. Normal range of motion.     Right lower leg: No edema.     Left lower leg: No edema.  Lymphadenopathy:     Cervical: No cervical adenopathy.  Skin:    Coloration: Skin is not pale.     Findings: No erythema or rash.   Neurological:     Mental Status: He is alert and oriented to person, place, and time.     Cranial Nerves: No cranial nerve deficit.     Motor: No abnormal muscle tone.     Coordination: Coordination normal.     Deep Tendon Reflexes: Reflexes normal.  Psychiatric:        Behavior: Behavior normal.        Thought Content: Thought content normal.        Judgment: Judgment normal.           Assessment & Plan:   Flank pain - Plan: Urinalysis, Routine w reflex microscopic  Urinalysis today is normal.  There is no blood.  There is no nitrites.  There is no leukocyte esterase.  I believe the pain in his flank is likely musculoskeletal.  I do not believe it is a coincidence that he is hurting all over.  I suspect that this is in response to him weaning down on the prednisone.  He has an appointment to see his rheumatologist later this afternoon and I would appreciate their input.  I feel convinced that the pain in his shoulders is likely due to the PMR.  I suspect that the flank pain also could be related to musculoskeletal pain.  I suspect that if they increase the prednisone both pains will start to get better.  Therefore we will await the advice of his rheumatologist this afternoon.

## 2020-06-26 NOTE — Patient Instructions (Signed)
I recommend increasing prednisone to 10 mg per day dose. We will call to see if this is helping with symptoms within about 2 weeks. If so, then stopping the methotrexate should be okay and we can see if the symptoms change off this medicine.  We will refer you to see a therapy group in [place] with aquatherapy treatment option to see if they can set this up for you.

## 2020-06-30 ENCOUNTER — Other Ambulatory Visit: Payer: Self-pay | Admitting: Family Medicine

## 2020-07-01 ENCOUNTER — Other Ambulatory Visit: Payer: Self-pay | Admitting: Cardiovascular Disease

## 2020-07-01 ENCOUNTER — Other Ambulatory Visit: Payer: Self-pay

## 2020-07-01 MED ORDER — APIXABAN 5 MG PO TABS
5.0000 mg | ORAL_TABLET | Freq: Two times a day (BID) | ORAL | 1 refills | Status: DC
Start: 2020-07-01 — End: 2020-07-01

## 2020-07-08 ENCOUNTER — Ambulatory Visit (INDEPENDENT_AMBULATORY_CARE_PROVIDER_SITE_OTHER): Payer: PPO | Admitting: Family Medicine

## 2020-07-08 ENCOUNTER — Encounter: Payer: Self-pay | Admitting: Family Medicine

## 2020-07-08 ENCOUNTER — Other Ambulatory Visit: Payer: Self-pay | Admitting: Family Medicine

## 2020-07-08 ENCOUNTER — Other Ambulatory Visit: Payer: Self-pay

## 2020-07-08 VITALS — BP 122/64 | HR 86 | Temp 97.9°F | Resp 14 | Ht 71.0 in | Wt 324.0 lb

## 2020-07-08 DIAGNOSIS — M353 Polymyalgia rheumatica: Secondary | ICD-10-CM | POA: Diagnosis not present

## 2020-07-08 MED ORDER — FUROSEMIDE 20 MG PO TABS: 20.0000 mg | ORAL_TABLET | Freq: Every day | ORAL | 3 refills | Status: DC

## 2020-07-08 NOTE — Progress Notes (Addendum)
Subjective:    Patient ID: Garrett Munoz, male    DOB: 1945-07-29, 75 y.o.   MRN: [id number]  HPI  06/26/20 4 days ago, the patient developed severe pain in his left flank.  The pain is just sitting in his left flank.  Is not radiating anywhere.  He denies any dysuria.  He does have increased urinary frequency.  He denies any gross hematuria however he states that the pain feels similar to pain he is experienced with kidney stones in the past.  Movement and twisting does not exacerbate the pain.  He has no CVA tenderness today on exam.  He is mildly tender to palpation over the area where he has the pain however I am not able to reproduce the pain with palpation.  He denies any nausea or vomiting or diarrhea.  He denies any melena or hematochezia.  He denies any constipation or fever or chills.  His abdomen is soft nondistended with normal bowel sounds.  The 2 leading suspect somewhat differential diagnosis would be musculoskeletal pain in the back versus possibly nephrolithiasis.  However patient had a CT scan of the abdomen and pelvis in April 2021 that showed no nephrolithiasis.  At that time, my plan was: Urinalysis today is normal.  There is no blood.  There is no nitrites.  There is no leukocyte esterase.  I believe the pain in his flank is likely musculoskeletal.  I do not believe it is a coincidence that he is hurting all over.  I suspect that this is in response to him weaning down on the prednisone.  He has an appointment to see his rheumatologist later this afternoon and I would appreciate their input.  I feel convinced that the pain in his shoulders is likely due to the PMR.  I suspect that the flank pain also could be related to musculoskeletal pain.  I suspect that if they increase the prednisone both pains will start to get better.  Therefore we will await the advice of his rheumatologist this afternoon.  07/08/20 Patient saw a rheumatology who discontinue methotrexate and increased his  prednisone to 10 mg a day.  After which, the pain in the patient's neck and shoulders and hands and arms improved dramatically.  He states that he has been gardening this week.  He feels so much better.  He does occasionally have a pain in his posterior right hip but this is minimal compared to where he was last week.  According to the family, the rheumatologist released him stating that there was very little more he could do.  Essentially they have been released back to my care.  They feel that he will have to continue prednisone 10 mg a day indefinitely or at least wean him down very slowly. Past Medical History:  Diagnosis Date  . Atrial fibrillation ([place])   . Bronchitis    hx  . Cataract    right eye  . CHF NYHA class III, acute on chronic, diastolic ([place])   . COPD (chronic obstructive pulmonary disease) ([place])   . DDD (degenerative disc disease), lumbosacral   . Dysrhythmia    irregular heatbeat  . Eye worm    right eye  . Gout   . History of kidney stones   . Hypertension   . Left knee DJD   . Morbid obesity with BMI of 40.0-44.9, adult ([place])   . Pneumonia    hx  . Prediabetes   . Radicular syndrome of left leg   .  S/P total knee replacement    right  . Sleep apnea    can't use sleep study 10 yrs ago  . 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    Past Surgical History:  Procedure Laterality Date  . CARDIOVERSION N/A 03/04/2015   Procedure: CARDIOVERSION;  Surgeon: [name], MD;  Location: [facility];  Service: Cardiovascular;  Laterality: N/A;  . CHOLECYSTECTOMY    . COLONOSCOPY N/A 01/01/2016   Procedure: COLONOSCOPY;  Surgeon: [name], MD;  [facility];  Service: Endoscopy;  Laterality: N/A;  1:45  . COLONOSCOPY N/A 01/14/2017   Procedure: COLONOSCOPY;  Surgeon: [name], MD;  [facility];  Service: Endoscopy;  Laterality: N/A;  12:05  . EYE EXAMINATION UNDER ANESTHESIA W/ RETINAL CRYOTHERAPY AND RETINAL LASER  07/2011  . EYE SURGERY   02/2011   cat rt  . KNEE ARTHROSCOPY     bilateral  . LUMBAR LAMINECTOMY/DECOMPRESSION MICRODISCECTOMY N/A 08/18/2017   Procedure: Bilateral microlumbar decompression Lumbar four-Lumbar five;  Surgeon: [name], MD;  Location: [facility];  Service: Orthopedics;  Laterality: N/A;  . POLYPECTOMY  01/01/2016   Procedure: POLYPECTOMY;  Surgeon: [name], MD;  [facility];  Service: Endoscopy;;  colon  . RETINAL DETACHMENT SURGERY  03/2011   rt  . TOTAL KNEE ARTHROPLASTY  2003   rt  . TOTAL KNEE ARTHROPLASTY  02/07/2012   Procedure: TOTAL KNEE ARTHROPLASTY;  Surgeon: [name], MD;  Location: [facility];  Service: Orthopedics;  Laterality: Left;   Current Outpatient Medications on File Prior to Visit  Medication Sig Dispense Refill  . allopurinol (ZYLOPRIM) 300 MG tablet TAKE 1 TABLET (300 MG TOTAL) BY MOUTH DAILY. 90 tablet 3  . apixaban (ELIQUIS) 5 MG TABS tablet Take 1 tablet (5 mg total) by mouth 2 (two) times daily. 180 tablet 1  . carvedilol (COREG) 12.5 MG tablet TAKE 1 & 1/2 TABLETS BY MOUTH EVERY MORNING AND 1 TABLET IN THE EVENING 225 tablet 3  . fluticasone (FLONASE) 50 MCG/ACT nasal spray SHAKE LIQUID AND USE 2 SPRAYS IN EACH NOSTRIL DAILY (Patient taking differently: Place 2 sprays into both nostrils daily as needed for allergies or rhinitis.) 16 g 6  . gabapentin (NEURONTIN) 300 MG capsule Take 2 capsules (600 mg total) by mouth 3 (three) times daily. 540 capsule 3  . HYDROcodone-acetaminophen (NORCO) 10-325 MG tablet Take 1 tablet by mouth 3 (three) times daily as needed for pain.    [name] losartan (COZAAR) 25 MG tablet Take 1 tablet (25 mg total) by mouth daily. 90 tablet 3  . Multiple Vitamins-Minerals (MULTIVITAMIN WITH MINERALS) tablet Take 1 tablet by mouth daily. 120 tablet 2  . predniSONE (DELTASONE) 10 MG tablet Take 1 tablet (10 mg total) by mouth daily with breakfast. 60 tablet 0  . temazepam (RESTORIL) 30 MG capsule TAKE 1 CAPSULE BY MOUTH AT BEDTIME AS  NEEDED FOR SLEEP. 30 capsule 2   No current facility-administered medications on file prior to visit.   No Known Allergies Social History   Socioeconomic History  . Marital status: Married    Spouse name: Not on file  . Number of children: Not on file  . Years of education: Not on file  . Highest education level: Not on file  Occupational History  . Not on file  Tobacco Use  . Smoking status: Former Smoker    Packs/day: 2.00    Years: 15.00    Pack years: 30.00  Types: Cigarettes    Quit date: 02/02/1992    Years since quitting: 28.4  . Smokeless tobacco: Never Used  . Tobacco comment: occ alcohol  Vaping Use  . Vaping Use: Never used  Substance and Sexual Activity  . Alcohol use: Yes    Alcohol/week: 0.0 standard drinks    Comment: occasional  . Drug use: No  . Sexual activity: Not on file  Other Topics Concern  . Not on file  Social History Narrative  . Not on file   Social Determinants of Health   Financial Resource Strain: Low Risk   . Difficulty of Paying Living Expenses: Not very hard  Food Insecurity: Not on file  Transportation Needs: Not on file  Physical Activity: Not on file  Stress: Not on file  Social Connections: Not on file  Intimate Partner Violence: Not on file      Review of Systems  All other systems reviewed and are negative.      Objective:   Physical Exam Vitals reviewed.  Constitutional:      General: He is not in acute distress.    Appearance: He is well-developed. He is not diaphoretic.  HENT:     Right Ear: External ear normal.     Left Ear: External ear normal.     Nose: Nose normal.     Mouth/Throat:     Pharynx: No oropharyngeal exudate.  Neck:     Thyroid: No thyromegaly.     Vascular: No JVD.     Trachea: No tracheal deviation.  Cardiovascular:     Rate and Rhythm: Normal rate. Rhythm irregular.     Heart sounds: Normal heart sounds.  Pulmonary:     Effort: Pulmonary effort is normal.     Breath sounds:  Normal breath sounds. No wheezing, rhonchi or rales.    Chest:     Chest wall: No tenderness.  Abdominal:     General: Bowel sounds are normal. There is no distension.     Palpations: Abdomen is soft. There is no mass.     Tenderness: There is no abdominal tenderness. There is no guarding or rebound.  Musculoskeletal:     Right shoulder: No tenderness. Normal range of motion.     Left shoulder: No tenderness. Normal range of motion.     Left wrist: No swelling, tenderness or bony tenderness. Normal range of motion.     Cervical back: Neck supple.     Lumbar back: Tenderness present. No deformity. Decreased range of motion.     Right hip: No tenderness or bony tenderness. Normal range of motion.     Left hip: No tenderness. Normal range of motion.     Right lower leg: Edema present.     Left lower leg: Edema present.  Lymphadenopathy:     Cervical: No cervical adenopathy.  Skin:    Coloration: Skin is not pale.     Findings: No erythema or rash.  Neurological:     Mental Status: He is alert and oriented to person, place, and time.     Cranial Nerves: No cranial nerve deficit.     Motor: No abnormal muscle tone.     Coordination: Coordination normal.     Deep Tendon Reflexes: Reflexes normal.  Psychiatric:        Behavior: Behavior normal.        Thought Content: Thought content normal.        Judgment: Judgment normal.  Assessment & Plan:   PMR (polymyalgia rheumatica) (HCC)  Continue prednisone 10 mg a day.  Recheck lab work in May including a CMP, CBC, fasting lipid panel.  Reassess in 1 month.  If he is stable at 10 mg and doing well, I will try to gradually step the patient down to 8 mg to find the lowest effective dose that will manage his pain.  Start Lasix 20 mg a day every day due to swelling in his legs

## 2020-07-11 ENCOUNTER — Telehealth: Payer: Self-pay | Admitting: Radiology

## 2020-07-11 NOTE — Telephone Encounter (Signed)
Received voicemail from Garrett Munoz's wife returning Dr. Marveen Reeks call- he is doing much better on Prednisone 10 mg. Patient has quit taking MTX.

## 2020-07-17 ENCOUNTER — Telehealth: Payer: Self-pay | Admitting: *Deleted

## 2020-07-17 MED ORDER — BENZONATATE 200 MG PO CAPS: 200.0000 mg | ORAL_CAPSULE | Freq: Two times a day (BID) | ORAL | 0 refills | Status: DC | PRN

## 2020-07-17 NOTE — Telephone Encounter (Signed)
Received call from patient spouse.   Reports that patient has dry cough. Reports no other Sx.   Prescription sent to pharmacy for [place]. Advised if cough has not resolved or improved x1 week, schedule OV.

## 2020-07-24 ENCOUNTER — Other Ambulatory Visit: Payer: Self-pay

## 2020-07-24 ENCOUNTER — Encounter: Payer: Self-pay | Admitting: Family Medicine

## 2020-07-24 ENCOUNTER — Other Ambulatory Visit (HOSPITAL_COMMUNITY): Payer: Self-pay

## 2020-07-24 ENCOUNTER — Ambulatory Visit (INDEPENDENT_AMBULATORY_CARE_PROVIDER_SITE_OTHER): Payer: PPO | Admitting: Family Medicine

## 2020-07-24 VITALS — BP 130/66 | HR 90 | Temp 98.6°F | Resp 18 | Ht 71.0 in | Wt 322.0 lb

## 2020-07-24 DIAGNOSIS — J45909 Unspecified asthma, uncomplicated: Secondary | ICD-10-CM

## 2020-07-24 MED ORDER — MONTELUKAST SODIUM 10 MG PO TABS
10.0000 mg | ORAL_TABLET | Freq: Every day | ORAL | 3 refills | Status: DC
  Filled 2020-07-24: qty 30, 30d supply, fill #0
  Filled 2020-08-18: qty 30, 30d supply, fill #1
  Filled 2020-09-16: qty 15, 15d supply, fill #2

## 2020-07-24 MED ORDER — FLUTICASONE PROPIONATE 50 MCG/ACT NA SUSP
2.0000 | Freq: Every day | NASAL | 6 refills | Status: DC
  Filled 2020-07-24: qty 16, 30d supply, fill #0

## 2020-07-24 MED ORDER — LEVOCETIRIZINE DIHYDROCHLORIDE 5 MG PO TABS
5.0000 mg | ORAL_TABLET | Freq: Every evening | ORAL | 3 refills | Status: DC
Start: 2020-07-24 — End: 2020-09-29
  Filled 2020-07-24: qty 30, 30d supply, fill #0
  Filled 2020-08-18: qty 30, 30d supply, fill #1
  Filled 2020-09-16: qty 15, 15d supply, fill #2

## 2020-07-24 NOTE — Progress Notes (Signed)
Subjective:    Patient ID: Garrett Munoz, male    DOB: 1945/09/13, 75 y.o.   MRN: [id number]  HPI Patient is suffering from allergies.  He reports itchy watery eyes, sneezing, rhinorrhea, and coughing.  On exam today, he has diminished breath sounds bilaterally and a few scattered expiratory wheezes.  He has sinus pressure in his forehead and in his maxillary sinuses.  He reports postnasal drip despite taking Allegra every day Past Medical History:  Diagnosis Date  . Atrial fibrillation ([place])   . Bronchitis    hx  . Cataract    right eye  . CHF NYHA class III, acute on chronic, diastolic ([place])   . COPD (chronic obstructive pulmonary disease) ([place])   . DDD (degenerative disc disease), lumbosacral   . Dysrhythmia    irregular heatbeat  . Eye worm    right eye  . Gout   . History of kidney stones   . Hypertension   . Left knee DJD   . Morbid obesity with BMI of 40.0-44.9, adult ([place])   . Pneumonia    hx  . Prediabetes   . Radicular syndrome of left leg   . S/P total knee replacement    right  . Sleep apnea    can't use sleep study 10 yrs ago  . 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    Past Surgical History:  Procedure Laterality Date  . CARDIOVERSION N/A 03/04/2015   Procedure: CARDIOVERSION;  Surgeon: [name], MD;  Location: [place];  Service: Cardiovascular;  Laterality: N/A;  . CHOLECYSTECTOMY    . COLONOSCOPY N/A 01/01/2016   Procedure: COLONOSCOPY;  Surgeon: [name], MD;  [facility];  Service: Endoscopy;  Laterality: N/A;  1:45  . COLONOSCOPY N/A 01/14/2017   Procedure: COLONOSCOPY;  Surgeon: [name], MD;  [facility];  Service: Endoscopy;  Laterality: N/A;  12:05  . EYE EXAMINATION UNDER ANESTHESIA W/ RETINAL CRYOTHERAPY AND RETINAL LASER  07/2011  . EYE SURGERY  02/2011   cat rt  . KNEE ARTHROSCOPY     bilateral  . LUMBAR LAMINECTOMY/DECOMPRESSION MICRODISCECTOMY N/A 08/18/2017   Procedure: Bilateral microlumbar  decompression Lumbar four-Lumbar five;  Surgeon: [name], MD;  Location: [place];  Service: Orthopedics;  Laterality: N/A;  . POLYPECTOMY  01/01/2016   Procedure: POLYPECTOMY;  Surgeon: [name], MD;  [facility];  Service: Endoscopy;;  colon  . RETINAL DETACHMENT SURGERY  03/2011   rt  . TOTAL KNEE ARTHROPLASTY  2003   rt  . TOTAL KNEE ARTHROPLASTY  02/07/2012   Procedure: TOTAL KNEE ARTHROPLASTY;  Surgeon: [name], MD;  Location: [place];  Service: Orthopedics;  Laterality: Left;   Current Outpatient Medications on File Prior to Visit  Medication Sig Dispense Refill  . allopurinol (ZYLOPRIM) 300 MG tablet TAKE 1 TABLET (300 MG TOTAL) BY MOUTH DAILY. 90 tablet 3  . amoxicillin (AMOXIL) 500 MG capsule TAKE 4 CAPSULES BY MOUTH 1 HOUR BEFORE DENTAL PROCEDURE 12 capsule 0  . apixaban (ELIQUIS) 5 MG TABS tablet TAKE 1 TABLET (5 MG TOTAL) BY MOUTH 2 (TWO) TIMES DAILY. 180 tablet 1  . benzonatate (TESSALON) 200 MG capsule Take 1 capsule (200 mg total) by mouth 2 (two) times daily as needed for cough. 20 capsule 0  . carvedilol (COREG) 12.5 MG tablet TAKE 1 & 1/2 TABLETS BY MOUTH EVERY MORNING AND 1 TABLET IN THE EVENING 225 tablet 3  . fluticasone (FLONASE)  50 MCG/ACT nasal spray SHAKE LIQUID AND USE 2 SPRAYS IN EACH NOSTRIL DAILY (Patient taking differently: Place 2 sprays into both nostrils daily as needed for allergies or rhinitis.) 16 g 6  . furosemide (LASIX) 20 MG tablet TAKE 1 TABLET (20 MG TOTAL) BY MOUTH DAILY. 30 tablet 3  . gabapentin (NEURONTIN) 300 MG capsule TAKE 2 CAPSULES (600 MG TOTAL) BY MOUTH 3 (THREE) TIMES DAILY. 540 capsule 3  . gabapentin (NEURONTIN) 300 MG capsule TAKE 1 CAPSULE AT BEDTIME FOR 3 DAYS, THEN 1 CAPSULE TWICE A DAY FOR 3 DAYS, THEN 1 CAPSULE 3 TIMES A DAY 90 capsule 0  . HYDROcodone-acetaminophen (NORCO) 10-325 MG tablet TAKE 1 TABLET BY MOUTH THREE TIMES DAILY AS NEEDED FOR PAIN (Patient taking differently: Take 1 tablet by mouth 3  (three) times daily as needed. for pain) 30 tablet 0  . losartan (COZAAR) 25 MG tablet TAKE 1 TABLET (25 MG TOTAL) BY MOUTH DAILY. 90 tablet 3  . methotrexate 2.5 MG tablet TAKE 6 TABLETS (15 MG TOTAL) BY MOUTH ONCE A WEEK. CAUTION:CHEMOTHERAPY. PROTECT FROM LIGHT. 24 tablet 1  . Multiple Vitamins-Minerals (MULTIVITAMIN WITH MINERALS) tablet Take 1 tablet by mouth daily. 120 tablet 2  . predniSONE (DELTASONE) 10 MG tablet TAKE 1 TABLET (10 MG TOTAL) BY MOUTH DAILY WITH BREAKFAST. 60 tablet 0  . temazepam (RESTORIL) 30 MG capsule TAKE 1 CAPSULE BY MOUTH AT BEDTIME AS NEEDED FOR SLEEP. 30 capsule 2  . [DISCONTINUED] potassium chloride SA (KLOR-CON) 20 MEQ tablet Take 1 tablet (20 mEq total) by mouth daily. (Patient not taking: Reported on 05/15/2020) 30 tablet 3   No current facility-administered medications on file prior to visit.   No Known Allergies Social History   Socioeconomic History  . Marital status: Married    Spouse name: Not on file  . Number of children: Not on file  . Years of education: Not on file  . Highest education level: Not on file  Occupational History  . Not on file  Tobacco Use  . Smoking status: Former Smoker    Packs/day: 2.00    Years: 15.00    Pack years: 30.00    Types: Cigarettes    Quit date: 02/02/1992    Years since quitting: 28.4  . Smokeless tobacco: Never Used  . Tobacco comment: occ alcohol  Vaping Use  . Vaping Use: Never used  Substance and Sexual Activity  . Alcohol use: Yes    Alcohol/week: 0.0 standard drinks    Comment: occasional  . Drug use: No  . Sexual activity: Not on file  Other Topics Concern  . Not on file  Social History Narrative  . Not on file   Social Determinants of Health   Financial Resource Strain: Low Risk   . Difficulty of Paying Living Expenses: Not very hard  Food Insecurity: Not on file  Transportation Needs: Not on file  Physical Activity: Not on file  Stress: Not on file  Social Connections: Not on file   Intimate Partner Violence: Not on file      Review of Systems  All other systems reviewed and are negative.      Objective:   Physical Exam Vitals reviewed.  Constitutional:      General: He is not in acute distress.    Appearance: He is well-developed. He is not diaphoretic.  HENT:     Right Ear: Tympanic membrane, ear canal and external ear normal.     Left Ear: Tympanic membrane, ear canal  and external ear normal.     Nose: Congestion and rhinorrhea present.     Mouth/Throat:     Pharynx: No oropharyngeal exudate.  Neck:     Thyroid: No thyromegaly.     Vascular: No JVD.     Trachea: No tracheal deviation.  Cardiovascular:     Rate and Rhythm: Normal rate. Rhythm irregularly irregular.     Heart sounds: Normal heart sounds.  Pulmonary:     Effort: Pulmonary effort is normal.     Breath sounds: Wheezing present. No rhonchi or rales.    Chest:     Chest wall: No tenderness.  Abdominal:     General: Bowel sounds are normal. There is no distension.     Palpations: Abdomen is soft. There is no mass.     Tenderness: There is no abdominal tenderness. There is no guarding or rebound.  Musculoskeletal:     Right shoulder: No tenderness. Normal range of motion.     Left shoulder: No tenderness. Normal range of motion.     Left wrist: No swelling, tenderness or bony tenderness. Normal range of motion.     Cervical back: Neck supple.     Lumbar back: Tenderness present. No deformity. Decreased range of motion.     Right hip: No tenderness or bony tenderness. Normal range of motion.     Left hip: No tenderness. Normal range of motion.     Right lower leg: No edema.     Left lower leg: No edema.  Lymphadenopathy:     Cervical: No cervical adenopathy.  Skin:    Coloration: Skin is not pale.     Findings: No erythema or rash.  Neurological:     Mental Status: He is alert and oriented to person, place, and time.     Cranial Nerves: No cranial nerve deficit.     Motor: No  abnormal muscle tone.     Coordination: Coordination normal.     Deep Tendon Reflexes: Reflexes normal.  Psychiatric:        Behavior: Behavior normal.        Thought Content: Thought content normal.        Judgment: Judgment normal.           Assessment & Plan:   Asthma due to seasonal allergies Discontinue Allegra and replace with Xyzal 5 mg a day, Flonase 2 sprays each nostril daily, and Singulair 10 mg daily.  If not improving after 1 week we can increase his prednisone temporarily but I want to try to avoid that if possible.

## 2020-07-29 ENCOUNTER — Other Ambulatory Visit: Payer: Self-pay

## 2020-07-29 ENCOUNTER — Ambulatory Visit (INDEPENDENT_AMBULATORY_CARE_PROVIDER_SITE_OTHER): Payer: PPO | Admitting: Family Medicine

## 2020-07-29 ENCOUNTER — Telehealth: Payer: Self-pay

## 2020-07-29 ENCOUNTER — Encounter: Payer: Self-pay | Admitting: Family Medicine

## 2020-07-29 ENCOUNTER — Other Ambulatory Visit (HOSPITAL_COMMUNITY): Payer: Self-pay

## 2020-07-29 ENCOUNTER — Telehealth: Payer: Self-pay | Admitting: Family Medicine

## 2020-07-29 ENCOUNTER — Ambulatory Visit: Payer: PPO | Admitting: Family Medicine

## 2020-07-29 VITALS — BP 128/88 | HR 82 | Temp 98.0°F | Resp 18 | Ht 71.0 in | Wt 325.0 lb

## 2020-07-29 DIAGNOSIS — J45909 Unspecified asthma, uncomplicated: Secondary | ICD-10-CM | POA: Diagnosis not present

## 2020-07-29 DIAGNOSIS — J441 Chronic obstructive pulmonary disease with (acute) exacerbation: Secondary | ICD-10-CM

## 2020-07-29 MED ORDER — PREDNISONE 20 MG PO TABS
60.0000 mg | ORAL_TABLET | Freq: Every day | ORAL | 0 refills | Status: DC
  Filled 2020-07-29: qty 21, 7d supply, fill #0

## 2020-07-29 MED ORDER — METHYLPREDNISOLONE ACETATE 40 MG/ML IJ SUSP
60.0000 mg | Freq: Once | INTRAMUSCULAR | Status: AC
  Administered 2020-07-29: 60 mg via INTRAMUSCULAR

## 2020-07-29 MED ORDER — ALBUTEROL SULFATE HFA 108 (90 BASE) MCG/ACT IN AERS
2.0000 | INHALATION_SPRAY | Freq: Four times a day (QID) | RESPIRATORY_TRACT | 0 refills | Status: AC | PRN
  Filled 2020-07-29: qty 8.5, 25d supply, fill #0

## 2020-07-29 MED ORDER — PREDNISONE 20 MG PO TABS
20.0000 mg | ORAL_TABLET | Freq: Every day | ORAL | 0 refills | Status: DC
  Filled 2020-07-29: qty 21, 21d supply, fill #0

## 2020-07-29 MED ORDER — AZITHROMYCIN 250 MG PO TABS
ORAL_TABLET | ORAL | 0 refills | Status: DC
  Filled 2020-07-29: qty 6, 5d supply, fill #0

## 2020-07-29 MED FILL — Losartan Potassium Tab 25 MG: ORAL | 90 days supply | Qty: 90 | Fill #0 | Status: AC

## 2020-07-29 NOTE — Progress Notes (Signed)
Subjective:    Patient ID: Garrett Munoz, male    DOB: 1945-12-20, 75 y.o.   MRN: [id number]  HPI Patient's breathing has worsened.  He is coughing constantly.  The cough is productive of brown thick sputum that is purulent in appearance.  He produced a sample today to show me.  He is short of breath.  On exam today he has diminished breath sounds throughout.  He has diffuse expiratory wheezing consistent with asthma exacerbation.  However he also has left basilar crackles.  He reports thick nasal discharge and constant sinus pressure.  He denies any chest pain or hemoptysis or pleurisy Past Medical History:  Diagnosis Date  . Atrial fibrillation ([place])   . Bronchitis    hx  . Cataract    right eye  . CHF NYHA class III, acute on chronic, diastolic ([place])   . COPD (chronic obstructive pulmonary disease) ([place])   . DDD (degenerative disc disease), lumbosacral   . Dysrhythmia    irregular heatbeat  . Eye worm    right eye  . Gout   . History of kidney stones   . Hypertension   . Left knee DJD   . Morbid obesity with BMI of 40.0-44.9, adult ([place])   . Pneumonia    hx  . Prediabetes   . Radicular syndrome of left leg   . S/P total knee replacement    right  . Sleep apnea    can't use sleep study 10 yrs ago  . 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    Past Surgical History:  Procedure Laterality Date  . CARDIOVERSION N/A 03/04/2015   Procedure: CARDIOVERSION;  Surgeon: [name], MD;  Location: [place];  Service: Cardiovascular;  Laterality: N/A;  . CHOLECYSTECTOMY    . COLONOSCOPY N/A 01/01/2016   Procedure: COLONOSCOPY;  Surgeon: [name], MD;  [facility];  Service: Endoscopy;  Laterality: N/A;  1:45  . COLONOSCOPY N/A 01/14/2017   Procedure: COLONOSCOPY;  Surgeon: [name], MD;  [facility];  Service: Endoscopy;  Laterality: N/A;  12:05  . EYE EXAMINATION UNDER ANESTHESIA W/ RETINAL CRYOTHERAPY AND RETINAL LASER  07/2011  . EYE SURGERY   02/2011   cat rt  . KNEE ARTHROSCOPY     bilateral  . LUMBAR LAMINECTOMY/DECOMPRESSION MICRODISCECTOMY N/A 08/18/2017   Procedure: Bilateral microlumbar decompression Lumbar four-Lumbar five;  Surgeon: [name], MD;  Location: [place];  Service: Orthopedics;  Laterality: N/A;  . POLYPECTOMY  01/01/2016   Procedure: POLYPECTOMY;  Surgeon: [name], MD;  [facility];  Service: Endoscopy;;  colon  . RETINAL DETACHMENT SURGERY  03/2011   rt  . TOTAL KNEE ARTHROPLASTY  2003   rt  . TOTAL KNEE ARTHROPLASTY  02/07/2012   Procedure: TOTAL KNEE ARTHROPLASTY;  Surgeon: [name], MD;  Location: [place];  Service: Orthopedics;  Laterality: Left;   Current Outpatient Medications on File Prior to Visit  Medication Sig Dispense Refill  . allopurinol (ZYLOPRIM) 300 MG tablet TAKE 1 TABLET (300 MG TOTAL) BY MOUTH DAILY. 90 tablet 3  . apixaban (ELIQUIS) 5 MG TABS tablet TAKE 1 TABLET (5 MG TOTAL) BY MOUTH 2 (TWO) TIMES DAILY. 180 tablet 1  . benzonatate (TESSALON) 200 MG capsule Take 1 capsule (200 mg total) by mouth 2 (two) times daily as needed for cough. 20 capsule 0  . carvedilol (COREG) 12.5 MG tablet TAKE 1 & 1/2 TABLETS BY MOUTH EVERY MORNING AND  1 TABLET IN THE EVENING 225 tablet 3  . fluticasone (FLONASE) 50 MCG/ACT nasal spray SHAKE LIQUID AND USE 2 SPRAYS IN EACH NOSTRIL DAILY (Patient taking differently: Place 2 sprays into both nostrils daily as needed for allergies or rhinitis.) 16 g 6  . furosemide (LASIX) 20 MG tablet TAKE 1 TABLET (20 MG TOTAL) BY MOUTH DAILY. 30 tablet 3  . gabapentin (NEURONTIN) 300 MG capsule TAKE 2 CAPSULES (600 MG TOTAL) BY MOUTH 3 (THREE) TIMES DAILY. 540 capsule 3  . HYDROcodone-acetaminophen (NORCO) 10-325 MG tablet TAKE 1 TABLET BY MOUTH THREE TIMES DAILY AS NEEDED FOR PAIN (Patient taking differently: Take 1 tablet by mouth 3 (three) times daily as needed. for pain) 30 tablet 0  . levocetirizine (XYZAL) 5 MG tablet Take 1 tablet (5 mg  total) by mouth every evening. 30 tablet 3  . losartan (COZAAR) 25 MG tablet TAKE 1 TABLET (25 MG TOTAL) BY MOUTH DAILY. 90 tablet 3  . montelukast (SINGULAIR) 10 MG tablet Take 1 tablet (10 mg total) by mouth at bedtime. 30 tablet 3  . Multiple Vitamins-Minerals (MULTIVITAMIN WITH MINERALS) tablet Take 1 tablet by mouth daily. 120 tablet 2  . predniSONE (DELTASONE) 10 MG tablet TAKE 1 TABLET (10 MG TOTAL) BY MOUTH DAILY WITH BREAKFAST. 60 tablet 0  . temazepam (RESTORIL) 30 MG capsule TAKE 1 CAPSULE BY MOUTH AT BEDTIME AS NEEDED FOR SLEEP. 30 capsule 2  . [DISCONTINUED] potassium chloride SA (KLOR-CON) 20 MEQ tablet Take 1 tablet (20 mEq total) by mouth daily. (Patient not taking: No sig reported) 30 tablet 3   No current facility-administered medications on file prior to visit.   No Known Allergies Social History   Socioeconomic History  . Marital status: Married    Spouse name: Not on file  . Number of children: Not on file  . Years of education: Not on file  . Highest education level: Not on file  Occupational History  . Not on file  Tobacco Use  . Smoking status: Former Smoker    Packs/day: 2.00    Years: 15.00    Pack years: 30.00    Types: Cigarettes    Quit date: 02/02/1992    Years since quitting: 28.5  . Smokeless tobacco: Never Used  . Tobacco comment: occ alcohol  Vaping Use  . Vaping Use: Never used  Substance and Sexual Activity  . Alcohol use: Yes    Alcohol/week: 0.0 standard drinks    Comment: occasional  . Drug use: No  . Sexual activity: Not on file  Other Topics Concern  . Not on file  Social History Narrative  . Not on file   Social Determinants of Health   Financial Resource Strain: Low Risk   . Difficulty of Paying Living Expenses: Not very hard  Food Insecurity: Not on file  Transportation Needs: Not on file  Physical Activity: Not on file  Stress: Not on file  Social Connections: Not on file  Intimate Partner Violence: Not on file       Review of Systems  All other systems reviewed and are negative.      Objective:   Physical Exam Vitals reviewed.  Constitutional:      General: He is not in acute distress.    Appearance: He is well-developed. He is not diaphoretic.  HENT:     Right Ear: Tympanic membrane, ear canal and external ear normal.     Left Ear: Tympanic membrane, ear canal and external ear normal.  Nose: Congestion and rhinorrhea present.     Mouth/Throat:     Pharynx: No oropharyngeal exudate.  Neck:     Thyroid: No thyromegaly.     Vascular: No JVD.     Trachea: No tracheal deviation.  Cardiovascular:     Rate and Rhythm: Normal rate. Rhythm irregularly irregular.     Heart sounds: Normal heart sounds.  Pulmonary:     Effort: Pulmonary effort is normal.     Breath sounds: Examination of the right-upper field reveals wheezing. Examination of the left-upper field reveals wheezing. Examination of the right-lower field reveals wheezing. Examination of the left-lower field reveals wheezing. Wheezing and rales present. No rhonchi.  Chest:     Chest wall: No tenderness.  Abdominal:     General: Bowel sounds are normal. There is no distension.     Palpations: Abdomen is soft. There is no mass.     Tenderness: There is no abdominal tenderness. There is no guarding or rebound.  Musculoskeletal:     Right shoulder: No tenderness. Normal range of motion.     Left shoulder: No tenderness. Normal range of motion.     Left wrist: No swelling, tenderness or bony tenderness. Normal range of motion.     Cervical back: Neck supple.     Lumbar back: Tenderness present. No deformity. Decreased range of motion.     Right hip: No tenderness or bony tenderness. Normal range of motion.     Left hip: No tenderness. Normal range of motion.     Right lower leg: No edema.     Left lower leg: No edema.  Lymphadenopathy:     Cervical: No cervical adenopathy.  Skin:    Coloration: Skin is not pale.      Findings: No erythema or rash.  Neurological:     Mental Status: He is alert and oriented to person, place, and time.     Cranial Nerves: No cranial nerve deficit.     Motor: No abnormal muscle tone.     Coordination: Coordination normal.     Deep Tendon Reflexes: Reflexes normal.  Psychiatric:        Behavior: Behavior normal.        Thought Content: Thought content normal.        Judgment: Judgment normal.           Assessment & Plan:   Asthma due to seasonal allergies  COPD exacerbation ([place])  Patient has decompensated since I saw him on April 7.  His asthma due to seasonal allergies has now progressed to a full-blown COPD exacerbation.  Begin a Z-Pak.  Patient was given 60 mg of Depo-Medrol IM x1 now.  I want him to be on 60 mg of prednisone daily starting tomorrow and temporarily discontinue his 10 mg that he takes for PMR.  Use albuterol 2 puffs every 4-6 hours as needed for wheezing.  Reassess in 48 hours if no better or sooner if worse

## 2020-07-29 NOTE — Telephone Encounter (Signed)
Copied from [place] [id number]. Topic: Medicare AWV >> Jul 29, 2020 10:55 AM Cher Nakai R wrote: Reason for CRM:   Left message for patient to call back and schedule Medicare Annual Wellness Visit (AWV) in office.   If not able to come in office, please offer to do virtually or by telephone.   Last AWV:  06/05/2019  Please schedule at anytime with [facility]Nurse Health Advisor.  If any questions, please contact me at [phone]

## 2020-07-29 NOTE — Telephone Encounter (Signed)
Asked to resend with new sig from pcp, resent med to [facility]

## 2020-07-29 NOTE — Addendum Note (Signed)
Addended by: Sheral Flow on: 07/29/2020 11:30 AM   Modules accepted: Orders

## 2020-07-29 NOTE — Telephone Encounter (Signed)
He is to take 60 mg a day (3, 20 mg tabs every day for 7 days) then resume his previous dose thereafter

## 2020-07-31 ENCOUNTER — Other Ambulatory Visit (HOSPITAL_COMMUNITY): Payer: Self-pay

## 2020-07-31 MED FILL — Temazepam Cap 30 MG: ORAL | 30 days supply | Qty: 30 | Fill #0 | Status: AC

## 2020-08-11 ENCOUNTER — Ambulatory Visit: Payer: PPO

## 2020-08-18 ENCOUNTER — Other Ambulatory Visit (HOSPITAL_COMMUNITY): Payer: Self-pay

## 2020-08-18 MED FILL — Gabapentin Cap 300 MG: ORAL | 30 days supply | Qty: 180 | Fill #0 | Status: AC

## 2020-08-20 ENCOUNTER — Telehealth: Payer: Self-pay | Admitting: Family Medicine

## 2020-08-20 NOTE — Telephone Encounter (Signed)
Left message for patient to call back and schedule Medicare Annual Wellness Visit (AWV) in office.   If not able to come in office, please offer to do virtually or by telephone.   Last AWV: 06/04/2019  Please schedule at anytime with [facility]Nurse Health Advisor.  If any questions, please contact me at [phone]  

## 2020-08-28 ENCOUNTER — Ambulatory Visit: Payer: PPO | Admitting: Internal Medicine

## 2020-08-28 ENCOUNTER — Other Ambulatory Visit: Payer: Self-pay

## 2020-08-28 ENCOUNTER — Encounter: Payer: Self-pay | Admitting: Internal Medicine

## 2020-08-28 ENCOUNTER — Other Ambulatory Visit (HOSPITAL_COMMUNITY): Payer: Self-pay

## 2020-08-28 VITALS — BP 143/82 | HR 76 | Ht 70.0 in | Wt 323.2 lb

## 2020-08-28 DIAGNOSIS — M5137 Other intervertebral disc degeneration, lumbosacral region: Secondary | ICD-10-CM | POA: Diagnosis not present

## 2020-08-28 DIAGNOSIS — Z79899 Other long term (current) drug therapy: Secondary | ICD-10-CM

## 2020-08-28 DIAGNOSIS — M353 Polymyalgia rheumatica: Secondary | ICD-10-CM | POA: Diagnosis not present

## 2020-08-28 MED ORDER — PREDNISONE 10 MG PO TABS
10.0000 mg | ORAL_TABLET | Freq: Every day | ORAL | 1 refills | Status: DC
  Filled 2020-08-28: qty 30, 30d supply, fill #0

## 2020-08-28 MED FILL — Temazepam Cap 30 MG: ORAL | 30 days supply | Qty: 30 | Fill #1 | Status: AC

## 2020-08-28 NOTE — Progress Notes (Signed)
Office Visit Note  Patient: Garrett Munoz             Date of Birth: [birth year]           MRN: [id number]             PCP: [name], MD Referring: [name], MD Visit Date: 08/28/2020   Subjective:  Follow-up (Patient tapered down to 5 mg Prednisone daily from 10 mg daily last week. Patient has noticed increased symptoms and pain. Patient was diagnosed with pneumonia within the last month and took high dose of Prednisone for 1 week. )   History of Present Illness: Garrett Munoz is a 75 y.o. male here for follow up for PMR after last visit methotrexate was discontinued and increased prednisone dose to 10 mg daily.  During the interval he became ill with pneumonia after attending a wedding outdoors in cold weather that caused a lot of shortness of breath and coughing was treated with antibiotics and a week of 60 mg prednisone.  These have improved he decrease his prednisone back to regular dose and then try tapering from 10 mg daily down to 5 mg daily.  With this reduction he noticed significant worsening of joint pains in his shoulders hands back hips and knees and a very decreased energy level with excessive daytime somnolence and difficulty carry out daily tasks.    Review of Systems  Constitutional: Positive for fatigue.  HENT: Positive for mouth dryness. Negative for mouth sores and nose dryness.   Eyes: Positive for itching. Negative for pain, visual disturbance and dryness.  Respiratory: Positive for cough and shortness of breath. Negative for hemoptysis and difficulty breathing.   Cardiovascular: Positive for irregular heartbeat. Negative for chest pain, palpitations and swelling in legs/feet.  Gastrointestinal: Negative for abdominal pain, blood in stool, constipation and diarrhea.  Endocrine: Negative for increased urination.  Genitourinary: Negative for painful urination.  Musculoskeletal: Positive for arthralgias, joint pain, joint swelling, myalgias,  muscle weakness, morning stiffness, muscle tenderness and myalgias.  Skin: Negative for color change, rash and redness.  Allergic/Immunologic: Negative for susceptible to infections.  Neurological: Positive for numbness and weakness. Negative for dizziness, headaches and memory loss.  Hematological: Negative for swollen glands.  Psychiatric/Behavioral: Positive for sleep disturbance. Negative for confusion.    PMFS History:  Patient Active Problem List   Diagnosis Date Noted  . Polymyalgia rheumatica ([place]) 04/10/2020  . 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  . High risk medication use 04/10/2020  . CHF NYHA class III, acute on chronic, diastolic ([place])   . Physical deconditioning 03/11/2020  . Prediabetes 03/10/2020  . Acute CHF (congestive heart failure) ([place]) 03/09/2020  . Acute respiratory failure with hypoxia and hypercapnia (HCC)   . Acquired thrombophilia ([place]) 10/19/2019  . Irritable bowel syndrome (IBS) 02/27/2019  . Bilateral carpal tunnel syndrome 08/28/2018  . Eustachian tube dysfunction, left 02/09/2018  . Sensorineural hearing loss (SNHL), 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  . Tinnitus of both ears 02/09/2018  . Spinal stenosis at L4-L5 level 08/18/2017  . Spinal stenosis of lumbar region 07/19/2017  . Degeneration of lumbosacral intervertebral disc 05/28/2017  . Low back pain 05/28/2017  . Gout 08/24/2016  . S/P total knee replacement 08/24/2016  . Cortical age-related cataract of left eye 08/24/2016  . Occult blood in stools 11/10/2015  . CAP (community acquired pneumonia) 04/10/2015  . Fatigue 04/10/2015  . Hypertensive disorder 04/10/2015  . HCAP (healthcare-associated pneumonia)   . Persistent atrial fibrillation ([place])   . Anticoagulation  adequate 02/21/2015  . Obstructive sleep apnea syndrome 02/01/2015  . Atrial fibrillation ([place]) 02/01/2015  . History of gout 02/01/2015  . Morbid obesity ([place]) 02/01/2015  . Pain in left knee 02/24/2012  . Weakness of left leg  02/24/2012  . Infection by loa loa   . Radicular syndrome of left leg   . Left knee DJD     Past Medical History:  Diagnosis Date  . Atrial fibrillation ([place])   . Bronchitis    hx  . Cataract    right eye  . CHF NYHA class III, acute on chronic, diastolic ([place])   . COPD (chronic obstructive pulmonary disease) ([place])   . DDD (degenerative disc disease), lumbosacral   . Dysrhythmia    irregular heatbeat  . Eye worm    right eye  . Gout   . History of kidney stones   . Hypertension   . Left knee DJD   . Morbid obesity with BMI of 40.0-44.9, adult ([place])   . Pneumonia    hx  . Prediabetes   . Radicular syndrome of left leg   . S/P total knee replacement    right  . Sleep apnea    can't use sleep study 10 yrs ago  . 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     Family History  Problem Relation Age of Onset  . Lung cancer Father    Past Surgical History:  Procedure Laterality Date  . CARDIOVERSION N/A 03/04/2015   Procedure: CARDIOVERSION;  Surgeon: [name], MD;  Location: [facility];  Service: Cardiovascular;  Laterality: N/A;  . CHOLECYSTECTOMY    . COLONOSCOPY N/A 01/01/2016   Procedure: COLONOSCOPY;  Surgeon: [name], MD;  [facility];  Service: Endoscopy;  Laterality: N/A;  1:45  . COLONOSCOPY N/A 01/14/2017   Procedure: COLONOSCOPY;  Surgeon: [name], MD;  [facility];  Service: Endoscopy;  Laterality: N/A;  12:05  . EYE EXAMINATION UNDER ANESTHESIA W/ RETINAL CRYOTHERAPY AND RETINAL LASER  07/2011  . EYE SURGERY  02/2011   cat rt  . KNEE ARTHROSCOPY     bilateral  . LUMBAR LAMINECTOMY/DECOMPRESSION MICRODISCECTOMY N/A 08/18/2017   Procedure: Bilateral microlumbar decompression Lumbar four-Lumbar five;  Surgeon: [name], MD;  Location: [facility];  Service: Orthopedics;  Laterality: N/A;  . POLYPECTOMY  01/01/2016   Procedure: POLYPECTOMY;  Surgeon: [name], MD;  [facility];  Service: Endoscopy;;  colon  .  RETINAL DETACHMENT SURGERY  03/2011   rt  . TOTAL KNEE ARTHROPLASTY  2003   rt  . TOTAL KNEE ARTHROPLASTY  02/07/2012   Procedure: TOTAL KNEE ARTHROPLASTY;  Surgeon: [name], MD;  Location: [facility];  Service: Orthopedics;  Laterality: Left;   Social History   Social History Narrative  . Not on file   Immunization History  Administered Date(s) Administered  . Fluad Quad(high Dose 65+) 12/20/2018, 01/09/2020  . Influenza, High Dose Seasonal PF 01/04/2017  . Influenza,inj,Quad PF,6+ Mos 01/04/2018  . Influenza-Unspecified 01/18/2016, 01/24/2017  . Moderna Sars-Covid-2 Vaccination 05/01/2019, 05/28/2019, 12/25/2019  . Pneumococcal Conjugate-13 01/04/2017, 02/17/2017  . Pneumococcal-Unspecified 02/17/2017  . Zoster Recombinat (Shingrix) 12/07/2017, 02/13/2018     Objective: Vital Signs: BP (!) 143/82 (BP Location: Left Arm, Patient Position: Sitting, Cuff Size: Large)   Pulse 76   Ht 5\' 10"  (1.778 m)   Wt (!) 323 lb 3.2 oz (146.6 kg)   BMI 46.37 kg/m    Physical Exam Constitutional:  Appearance: He is obese.  Eyes:     Conjunctiva/sclera: Conjunctivae normal.  Skin:    General: Skin is warm and dry.     Comments: Few scattered ecchymoses on forearms  Neurological:     General: No focal deficit present.     Mental Status: He is alert.  Psychiatric:        Mood and Affect: Mood normal.     Musculoskeletal Exam:  Shoulders full ROM no tenderness or swelling, strength appears normal but right shoulder pain provoked with resisted abduction Elbows full ROM no tenderness or swelling Wrists full ROM no tenderness or swelling Fingers full ROM no tenderness or swelling Decreased hip internal rotation range of motion bilaterally Knees no tenderness or swelling bilateral stiffness and patellofemoral crepitus 1+ pitting pedal edema bilaterally above ankles without skin changes  Investigation: No additional findings.  Imaging: No results found.  Recent Labs: Lab  Results  Component Value Date   WBC 7.5 05/15/2020   HGB 14.5 05/15/2020   PLT 210 05/15/2020   NA 139 05/15/2020   K 4.7 05/15/2020   CL 100 05/15/2020   CO2 34 (H) 05/15/2020   GLUCOSE 89 05/15/2020   BUN 13 05/15/2020   CREATININE 0.84 05/15/2020   BILITOT 0.4 05/15/2020   ALKPHOS 57 03/09/2020   AST 18 05/15/2020   ALT 18 05/15/2020   PROT 6.2 05/15/2020   ALBUMIN 3.0 (L) 03/09/2020   CALCIUM 9.0 05/15/2020   GFRAA 99 05/15/2020    Speciality Comments: No specialty comments available.  Procedures:  No procedures performed Allergies: Patient has no known allergies.   Assessment / Plan:     Visit Diagnoses: Polymyalgia rheumatica ([place])  High risk medication use - Plan: predniSONE (DELTASONE) 10 MG tablet  Chronic joint pain with mild weakness but severe fatigue did not see much evidence of inflammatory changes on exam but he has not tolerated prednisone tapering to less than 10 mg daily very well at all and had no improvement with adding methotrexate for this.  We discussed there is no great evidence-based alternative treatments for this problem.  Discussed options including a more gradual tapering regimen but patient is wife feel they would strongly prefer to remain at the current moderate prednisone dose due to a very large quality of life improvement with regards to pain and fatigue.  We reviewed again potential side effects with prednisone and things to watch out for including fluid retention, weight gain, hyperglycemia, infection risks, eye involvement, or bleeding risks.  We discussed for shared decision making that the clinical benefit seems to outweigh the modest risk increase to stay at a slightly higher dose of the steroids.  Lumbar spine osteoarthritis  I recommended he may follow-up with Dr. Maxie Better for the chronic pain in his back hip and knee seems much more consistent with osteoarthritis than inflammatory joint disease.   Orders: No orders of the defined types  were placed in this encounter.  Meds ordered this encounter  Medications  . predniSONE (DELTASONE) 10 MG tablet    Sig: TAKE 1 TABLET (10 MG TOTAL) BY MOUTH DAILY WITH BREAKFAST.    Dispense:  90 tablet    Refill:  1     Follow-Up Instructions: Return in about 6 months (around 02/28/2021) for PMR, OA.   Collier Salina, MD  Note - This record has been created using [employer].  Chart creation errors have been sought, but may not always  have been located. Such creation errors do not  reflect on  the standard of medical care.

## 2020-08-29 ENCOUNTER — Other Ambulatory Visit (HOSPITAL_COMMUNITY): Payer: Self-pay

## 2020-09-16 ENCOUNTER — Other Ambulatory Visit (HOSPITAL_COMMUNITY): Payer: Self-pay

## 2020-09-16 ENCOUNTER — Telehealth: Payer: Self-pay | Admitting: *Deleted

## 2020-09-16 NOTE — Telephone Encounter (Signed)
He could try astelin 2 sprays each nostrl daily with his other meds.

## 2020-09-16 NOTE — Telephone Encounter (Signed)
Received call from patient wife, Katharine Look [phone] telephone.   Reports that patient is requesting another medication for seasonal allergies. States that patient is having increased red, itchy eyes, sneezing, and clear nasal drainage throughout the day.   States that patient is currently taking Xyzal, Singulair and Flonase.   Reports that he has tried OTC allergy medications without relief.   Please advise.

## 2020-09-16 NOTE — Telephone Encounter (Signed)
Call placed to patient and patient wife made aware.

## 2020-09-18 ENCOUNTER — Telehealth: Payer: Self-pay | Admitting: Pharmacist

## 2020-09-18 NOTE — Progress Notes (Addendum)
Chronic Care Management Pharmacy Assistant   Name: Garrett Munoz  MRN: [id number] DOB: [birth year]  Reason for Encounter: General Disease State Call   Conditions to be addressed/monitored: hypertension, atrial fibrillation, chronic pain  Recent office visits: 07/29/20 Dr. Dennard Schaumann For  asthma due to seasonal allergies. STARTED Albuterol Sulfate 108(90 Base) 2 puffs every 6 hours PRN and Azithromycin 250 mg 2 tablets on day 1 then 1 tablet by mouth daily for 4 days. STARTED Prednisone of 20 mg daily. Per note: I want him to be on 60 mg of prednisone daily starting tomorrow and temporarily discontinue his 10 mg that he takes for PMR.  07/24/20 Dr. Dennard Schaumann For asthma due to seasonal allergies. STARTED Levocetirizine Dihydrochloride 5 mg every evening and Montelukast Sodium 10 mg daily at bedtime. STOPPED Allegra.  07/08/20 Dr. Dennard Schaumann For follow-up. CHANGED Furosemide to 20 mg daily. DECREASED methotrexate to 2.5 mg. 06/26/20 Dr. Dennard Schaumann For flank pain. No medication changes. 06/09/20 Dr. Dennard Schaumann For follow-up. No medication changes.   04/25/20 Dr. Dennard Schaumann For follow-up. No medication changes.  04/03/20 Dr. Dennard Schaumann For polyarthralgia. Per note: Decrease prednisone to 15 mg a day for 1 week then decrease to 12.5 mg of prednisone per day for 2 weeks then decrease to 10 mg of prednisone today if the patient will tolerate.   03/31/20 Dr. Dennard Schaumann For polyarthralgia. No medication changes.  03/24/20 Dr. Dennard Schaumann For follow-up. No medication changes.   Recent consult visits: 08/28/20 Rheumatology Benjamine Mola, Resa Miner, MD. For follow-up. STOPPED Prednisone 20 mg.  06/26/20  Rheumatology Rice, Resa Miner, MD. For follow-up. Per note: INCREASED Prednisone to 10 mg. 05/26/20 Cardiology [name], MD. For follow-up. STARTED Losartan 25 mg daily. STOPPED Umeclidinium-Vilanterol and Potassium.   05/15/20 Rheumatology Rice, Resa Miner, MD. For follow-up. Per note: Patient received a depo medrol shot.STOPPED  Prednisone 20 mg.  04/22/20 Pulmonology Chesley Mires, MD. For snoring. STOPPED Amoxicillin. 04/10/20 Rheumatology Rice, Resa Miner, MD. For follow-up. DECREASED Gabapentin to 300 mg 1 cap for 3 days, then 1 cap twice a day for 3 days then 1 cap three times a day and Methotrexate to 2.5 mg take 6 tablets once a week. STOPPED Umeclidinium-Vilanterol.   Hospital visits:  None in previous 6 months  Medications: Outpatient Encounter Medications as of 09/18/2020  Medication Sig   albuterol (VENTOLIN HFA) 108 (90 Base) MCG/ACT inhaler Inhale 2 puffs into the lungs every 6 (six) hours as needed for wheezing or shortness of breath.   allopurinol (ZYLOPRIM) 300 MG tablet TAKE 1 TABLET (300 MG TOTAL) BY MOUTH DAILY.   apixaban (ELIQUIS) 5 MG TABS tablet TAKE 1 TABLET (5 MG TOTAL) BY MOUTH 2 (TWO) TIMES DAILY.   azithromycin (ZITHROMAX) 250 MG tablet Take 2 tablets by mouth on day 1, then take 1 tablet by mouth daily for 4 days (Patient taking differently: Take 2 tablets by mouth on day 1, then take 1 tablet by mouth daily for 4 days)   benzonatate (TESSALON) 200 MG capsule Take 1 capsule (200 mg total) by mouth 2 (two) times daily as needed for cough.   carvedilol (COREG) 12.5 MG tablet TAKE 1 & 1/2 TABLETS BY MOUTH EVERY MORNING AND 1 TABLET IN THE EVENING   fluticasone (FLONASE) 50 MCG/ACT nasal spray SHAKE LIQUID AND USE 2 SPRAYS IN EACH NOSTRIL DAILY (Patient taking differently: Place 2 sprays into both nostrils daily as needed for allergies or rhinitis.)   furosemide (LASIX) 20 MG tablet TAKE 1 TABLET (20 MG TOTAL) BY MOUTH  DAILY.   gabapentin (NEURONTIN) 300 MG capsule TAKE 2 CAPSULES (600 MG TOTAL) BY MOUTH 3 (THREE) TIMES DAILY.   levocetirizine (XYZAL) 5 MG tablet Take 1 tablet (5 mg total) by mouth every evening.   losartan (COZAAR) 25 MG tablet TAKE 1 TABLET (25 MG TOTAL) BY MOUTH DAILY.   montelukast (SINGULAIR) 10 MG tablet Take 1 tablet (10 mg total) by mouth at bedtime.   Multiple  Vitamins-Minerals (MULTIVITAMIN WITH MINERALS) tablet Take 1 tablet by mouth daily.   predniSONE (DELTASONE) 10 MG tablet Take 1 tablet (10 mg total) by mouth daily with breakfast.   temazepam (RESTORIL) 30 MG capsule TAKE 1 CAPSULE BY MOUTH AT BEDTIME AS NEEDED FOR SLEEP.   [DISCONTINUED] potassium chloride SA (KLOR-CON) 20 MEQ tablet Take 1 tablet (20 mEq total) by mouth daily. (Patient not taking: No sig reported)   No facility-administered encounter medications on file as of 09/18/2020.   GEN CALL: The patients wife stated he had pneumonia and they had to keep changing his prednisone lower and higher. She stated its been set to stay at 10 mg at this time. She stated he does see a rheumatologist to help with his arthritis pain.  His wife stated he does mow the yard and works out in the yard. She stated he does eat three meals a day and she ensures he eats a well rounded diet. She stated he drinks plenty of water with a little bit of tea and coffee on the side. She stated she did not have questions or concerns about his medication at this time. Scheduled appointment with chris davis on 10/13/20 at 2:30 pm    Star Rating Drugs: Gabapentin 300 mg 30 DS 08/19/20, Losartan 25 mg 90 DS 07/30/20.   Follow-Up:Pharmacist Review  Charlann Lange, RMA Clinical Pharmacist Assistant [phone]  7 minutes spent in review, coordination, and documentation.  Reviewed by: Beverly Milch, PharmD Clinical Pharmacist [facility] [phone]

## 2020-09-29 ENCOUNTER — Other Ambulatory Visit: Payer: Self-pay

## 2020-09-29 ENCOUNTER — Other Ambulatory Visit: Payer: Self-pay | Admitting: Family Medicine

## 2020-09-29 ENCOUNTER — Other Ambulatory Visit (HOSPITAL_COMMUNITY): Payer: Self-pay

## 2020-09-29 DIAGNOSIS — M353 Polymyalgia rheumatica: Secondary | ICD-10-CM

## 2020-09-29 MED ORDER — LEVOCETIRIZINE DIHYDROCHLORIDE 5 MG PO TABS
5.0000 mg | ORAL_TABLET | Freq: Every evening | ORAL | 3 refills | Status: DC
  Filled 2020-09-29: qty 30, 30d supply, fill #0

## 2020-09-29 MED ORDER — PREDNISONE 10 MG PO TABS
10.0000 mg | ORAL_TABLET | Freq: Every day | ORAL | 1 refills | Status: DC
  Filled 2020-09-29: qty 90, 90d supply, fill #0
  Filled 2021-01-02: qty 90, 90d supply, fill #1

## 2020-09-29 MED ORDER — MONTELUKAST SODIUM 10 MG PO TABS
10.0000 mg | ORAL_TABLET | Freq: Every day | ORAL | 3 refills | Status: DC
  Filled 2020-09-29: qty 30, 30d supply, fill #0

## 2020-09-29 MED ORDER — MONTELUKAST SODIUM 10 MG PO TABS
10.0000 mg | ORAL_TABLET | Freq: Every day | ORAL | 3 refills | Status: DC
  Filled 2020-09-29 (×2): qty 90, 90d supply, fill #0

## 2020-09-29 MED ORDER — LEVOCETIRIZINE DIHYDROCHLORIDE 5 MG PO TABS
5.0000 mg | ORAL_TABLET | Freq: Every evening | ORAL | 3 refills | Status: DC
  Filled 2020-09-29 (×2): qty 90, 90d supply, fill #0

## 2020-09-29 MED FILL — Allopurinol Tab 300 MG: ORAL | 90 days supply | Qty: 90 | Fill #0 | Status: AC

## 2020-09-29 MED FILL — Carvedilol Tab 12.5 MG: ORAL | 90 days supply | Qty: 225 | Fill #0 | Status: AC

## 2020-09-29 MED FILL — Apixaban Tab 5 MG: ORAL | 90 days supply | Qty: 180 | Fill #0 | Status: AC

## 2020-09-29 MED FILL — Gabapentin Cap 300 MG: ORAL | 30 days supply | Qty: 180 | Fill #1 | Status: CN

## 2020-09-29 MED FILL — Losartan Potassium Tab 25 MG: ORAL | 90 days supply | Qty: 90 | Fill #1 | Status: AC

## 2020-09-29 NOTE — Telephone Encounter (Signed)
Ok to refill??  Last office visit 07/29/2020.   Last refill 06/30/2020, #2 refills.

## 2020-09-29 NOTE — Telephone Encounter (Signed)
Next Visit: 03/03/2021  Last Visit: 08/28/2020  Last Fill: 08/28/2020  DX: Polymyalgia rheumatica  Current Dose per office note 08/28/2020: predniSONE (DELTASONE) 10 MG tablet  Okay to refill Prednisone?

## 2020-09-29 NOTE — Telephone Encounter (Signed)
Patient's wife called requesting prescription refill of Prednisone (90 day supply) sent to [facility].

## 2020-09-30 ENCOUNTER — Other Ambulatory Visit (HOSPITAL_COMMUNITY): Payer: Self-pay

## 2020-09-30 MED ORDER — TEMAZEPAM 30 MG PO CAPS
30.0000 mg | ORAL_CAPSULE | Freq: Every evening | ORAL | 2 refills | Status: DC | PRN
  Filled 2020-09-30: qty 30, 30d supply, fill #0
  Filled 2020-11-01: qty 30, 30d supply, fill #1
  Filled 2020-12-03: qty 30, 30d supply, fill #2

## 2020-09-30 MED FILL — Gabapentin Cap 300 MG: ORAL | 90 days supply | Qty: 540 | Fill #1 | Status: AC

## 2020-10-06 ENCOUNTER — Telehealth: Payer: Self-pay | Admitting: Pulmonary Disease

## 2020-10-06 ENCOUNTER — Other Ambulatory Visit (HOSPITAL_COMMUNITY): Payer: Self-pay

## 2020-10-06 NOTE — Telephone Encounter (Signed)
Spoke with the pt's spouse ok per DPR. She states pt wanting to switch to Dr [name] from Dr Halford Chessman in [place].  She states needing to seen for pulmonary issues, not sleep. He states that he is already managed by his PCP for his OSA. Please advise if okay to change to Dr [name], thanks.

## 2020-10-06 NOTE — Progress Notes (Deleted)
Chronic Care Management Pharmacy Note  10/06/2020 Name:  Garrett Munoz MRN:  [id number] DOB:  09-Nov-1945  Summary:   Recommendations/Changes made from today's visit:   Plan:    Subjective: Garrett Munoz is an 75 y.o. year old male who is a primary patient of Pickard, Cammie Mcgee, MD.  The CCM team was consulted for assistance with disease management and care coordination needs.    Engaged with patient by telephone for follow up visit in response to provider referral for pharmacy case management and/or care coordination services.   Consent to Services:  The patient was given the following information about Chronic Care Management services today, agreed to services, and gave verbal consent: 1. CCM service includes personalized support from designated clinical staff supervised by the primary care provider, including individualized plan of care and coordination with other care providers 2. 24/7 contact phone numbers for assistance for urgent and routine care needs. 3. Service will only be billed when office clinical staff spend 20 minutes or more in a month to coordinate care. 4. Only one practitioner may furnish and bill the service in a calendar month. 5.The patient may stop CCM services at any time (effective at the end of the month) by phone call to the office staff. 6. The patient will be responsible for cost sharing (co-pay) of up to 20% of the service fee (after annual deductible is met). Patient agreed to services and consent obtained.  Patient Care Team: [name], MD as PCP - General (Family Medicine) [name], MD as PCP - Cardiology (Cardiology) [name], MD as Consulting Physician (Gastroenterology) Edythe Clarity, [facility] as Pharmacist (Pharmacist)  Recent office visits: None since last CCM call on 09/18/20  Recent consult visits: None since last CCM call on 09/18/20  Hospital visits: None since last CCM call on 09/18/20   Objective:  Lab  Results  Component Value Date   CREATININE 0.84 05/15/2020   BUN 13 05/15/2020   GFRNONAA 85 05/15/2020   GFRAA 99 05/15/2020   NA 139 05/15/2020   K 4.7 05/15/2020   CALCIUM 9.0 05/15/2020   CO2 34 (H) 05/15/2020   GLUCOSE 89 05/15/2020    Lab Results  Component Value Date/Time   HGBA1C 5.8 (H) 03/10/2020 04:26 AM   HGBA1C 5.4 07/23/2016 08:05 AM    Last diabetic Eye exam: No results found for: HMDIABEYEEXA  Last diabetic Foot exam: No results found for: HMDIABFOOTEX   Lab Results  Component Value Date   CHOL 136 06/09/2020   HDL 62 06/09/2020   LDLCALC 60 06/09/2020   TRIG 64 06/09/2020   CHOLHDL 2.2 06/09/2020    Hepatic Function Latest Ref Rng & Units 05/15/2020 03/31/2020 03/09/2020  Total Protein 6.1 - 8.1 g/dL 6.2 6.1 6.4(L)  Albumin 3.5 - 5.0 g/dL - - 3.0(L)  AST 10 - 35 U/L _0 ALT 9 - 46 U/L _1 Alk Phosphatase 38 - 126 U/L - - 57  Total Bilirubin 0.2 - 1.2 mg/dL 0.4 0.9 0.6  Bilirubin, Direct 0.1 - 0.5 mg/dL - - -    Lab Results  Component Value Date/Time   TSH 0.88 08/24/2018 10:51 AM   TSH 0.97 10/04/2017 09:28 AM    CBC Latest Ref Rng & Units 05/15/2020 03/31/2020 03/24/2020  WBC 3.8 - 10.8 Thousand/uL 7.5 7.1 8.0  Hemoglobin 13.2 - 17.1 g/dL 14.5 14.2 15.0  Hematocrit 38.5 - 50.0 % 43.0 41.7 43.6  Platelets 140 -  400 Thousand/uL 210 195 231    No results found for: VD25OH  Clinical ASCVD: {YES/NO:21197} The 10-year ASCVD risk score Mikey Bussing DC Jr., et al., 2013) is: 30.2%   Values used to calculate the score:     Age: 29 years     Sex: Male     Is Non-Hispanic African American: No     Diabetic: No     Tobacco smoker: No     Systolic Blood Pressure: 423 mmHg     Is BP treated: Yes     HDL Cholesterol: 62 mg/dL     Total Cholesterol: 136 mg/dL    Depression screen [facility] 2/9 04/25/2020 06/04/2019 02/09/2018  Decreased Interest 0 0 2  Down, Depressed, Hopeless 0 0 0  PHQ - 2 Score 0 0 2  Altered sleeping - - 3  Tired, decreased  energy - - 3  Change in appetite - - 0  Feeling bad or failure about yourself  - - 0  Trouble concentrating - - 0  Moving slowly or fidgety/restless - - 0  Suicidal thoughts - - 0  PHQ-9 Score - - 8  Difficult doing work/chores - - Somewhat difficult     ***Other: (CHADS2VASc if Afib, MMRC or CAT for COPD, ACT, DEXA)  Social History   Tobacco Use  Smoking Status Former   Packs/day: 2.00   Years: 15.00   Pack years: 30.00   Types: Cigarettes   Quit date: 02/02/1992   Years since quitting: 28.6  Smokeless Tobacco Never  Tobacco Comments   occ alcohol   BP Readings from Last 3 Encounters:  08/28/20 (!) 143/82  07/29/20 128/88  07/24/20 130/66   Pulse Readings from Last 3 Encounters:  08/28/20 76  07/29/20 82  07/24/20 90   Wt Readings from Last 3 Encounters:  08/28/20 (!) 323 lb 3.2 oz (146.6 kg)  07/29/20 (!) 325 lb (147.4 kg)  07/24/20 (!) 322 lb (146.1 kg)   BMI Readings from Last 3 Encounters:  08/28/20 46.37 kg/m  07/29/20 45.33 kg/m  07/24/20 44.91 kg/m    Assessment/Interventions: Review of patient past medical history, allergies, medications, health status, including review of consultants reports, laboratory and other test data, was performed as part of comprehensive evaluation and provision of chronic care management services.   SDOH:  (Social Determinants of Health) assessments and interventions performed: {yes/no:20286}  SDOH Screenings   Alcohol Screen: Not on file  Depression (PHQ2-9): Low Risk    PHQ-2 Score: 0  Financial Resource Strain: Not on file  Food Insecurity: Not on file  Housing: Not on file  Physical Activity: Not on file  Social Connections: Not on file  Stress: Not on file  Tobacco Use: Medium Risk   Smoking Tobacco Use: Former   Smokeless Tobacco Use: Never  Transportation Needs: Not on file    [place]  No Known Allergies  Medications Reviewed Today     Reviewed by Collier Salina, MD (Physician) on  08/28/20 at [place] List Status: <None>   Medication Order Taking? Sig Documenting Provider Last Dose Status Informant  albuterol (VENTOLIN HFA) 108 (90 Base) MCG/ACT inhaler [id number] Yes Inhale 2 puffs into the lungs every 6 (six) hours as needed for wheezing or shortness of breath. [name], MD Taking Active   allopurinol (ZYLOPRIM) 300 MG tablet [id number] Yes TAKE 1 TABLET (300 MG TOTAL) BY MOUTH DAILY. [name], MD Taking Active   apixaban [facility]) 5 MG TABS tablet [id number] Yes  TAKE 1 TABLET (5 MG TOTAL) BY MOUTH 2 (TWO) TIMES DAILY. [name], MD Taking Active   azithromycin [facility]) 250 MG tablet [id number] Yes Take 2 tablets by mouth on day 1, then take 1 tablet by mouth daily for 4 days  Patient taking differently: Take 2 tablets by mouth on day 1, then take 1 tablet by mouth daily for 4 days   [name], MD Taking Active   benzonatate (TESSALON) 200 MG capsule [id number] Yes Take 1 capsule (200 mg total) by mouth 2 (two) times daily as needed for cough. [name], MD Taking Active   carvedilol (COREG) 12.5 MG tablet [id number] Yes TAKE 1 & 1/2 TABLETS BY MOUTH EVERY MORNING AND 1 TABLET IN THE EVENING [name], MD Taking Active   fluticasone (FLONASE) 50 MCG/ACT nasal spray [id number] Yes SHAKE LIQUID AND USE 2 SPRAYS IN EACH NOSTRIL DAILY  Patient taking differently: Place 2 sprays into both nostrils daily as needed for allergies or rhinitis.   [name], MD Taking Active   furosemide (LASIX) 20 MG tablet [id number] Yes TAKE 1 TABLET (20 MG TOTAL) BY MOUTH DAILY. [name], MD Taking Active   gabapentin (NEURONTIN) 300 MG capsule [id number] Yes TAKE 2 CAPSULES (600 MG TOTAL) BY MOUTH 3 (THREE) TIMES DAILY. [name], MD Taking Active   levocetirizine (XYZAL) 5 MG tablet [id number] Yes Take 1 tablet (5 mg total) by mouth every evening. [name], MD Taking Active   losartan (COZAAR) 25 MG tablet [id number] Yes  TAKE 1 TABLET (25 MG TOTAL) BY MOUTH DAILY. [name], MD Taking Active   montelukast (SINGULAIR) 10 MG tablet [id number] Yes Take 1 tablet (10 mg total) by mouth at bedtime. [name], MD Taking Active   Multiple Vitamins-Minerals (MULTIVITAMIN WITH MINERALS) tablet [id number] Yes Take 1 tablet by mouth daily. Dwyane Dee, MD Taking Active    Patient not taking:   Discontinued 05/26/20 1448 (Patient Preference)   predniSONE (DELTASONE) 10 MG tablet [id number]  TAKE 1 TABLET (10 MG TOTAL) BY MOUTH DAILY WITH BREAKFAST. Collier Salina, MD  Active   Patient not taking:  Discontinued 08/28/20 1036   temazepam (RESTORIL) 30 MG capsule [id number] Yes TAKE 1 CAPSULE BY MOUTH AT BEDTIME AS NEEDED FOR SLEEP. 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   High risk medication use 04/10/2020   CHF NYHA class III, acute on chronic, diastolic (HCC)    Physical deconditioning 03/11/2020   Prediabetes 03/10/2020   Acute CHF (congestive heart failure) ([place]) 03/09/2020   Acute respiratory failure with hypoxia and hypercapnia (HCC)    Acquired thrombophilia ([place]) 10/19/2019   Irritable bowel syndrome (IBS) 02/27/2019   Bilateral carpal tunnel syndrome 08/28/2018   Eustachian tube dysfunction, left 02/09/2018   Sensorineural hearing loss (SNHL), 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   Tinnitus of both ears 02/09/2018   Spinal stenosis at L4-L5 level 08/18/2017   Spinal stenosis of lumbar region 07/19/2017   Degeneration of lumbosacral intervertebral disc 05/28/2017   Low back pain 05/28/2017   Gout 08/24/2016   S/P total knee replacement 08/24/2016   Cortical age-related cataract of left eye 08/24/2016   Occult blood in stools 11/10/2015   CAP (community acquired pneumonia) 04/10/2015   Fatigue 04/10/2015   Hypertensive disorder 04/10/2015   HCAP  (  healthcare-associated pneumonia)    Persistent atrial fibrillation ([place])    Anticoagulation adequate 02/21/2015   Obstructive sleep apnea syndrome 02/01/2015   Atrial fibrillation ([place]) 02/01/2015   History of gout 02/01/2015   Morbid obesity ([place]) 02/01/2015   Pain in left knee 02/24/2012   Weakness of left leg 02/24/2012   Infection by loa loa    Radicular syndrome of left leg    Left knee DJD     Immunization History  Administered Date(s) Administered   Fluad Quad(high Dose 65+) 12/20/2018, 01/09/2020   Influenza, High Dose Seasonal PF 01/04/2017   Influenza,inj,Quad PF,6+ Mos 01/04/2018   Influenza-Unspecified 01/18/2016, 01/24/2017   Moderna Sars-Covid-2 Vaccination 05/01/2019, 05/28/2019, 12/25/2019   Pneumococcal Conjugate-13 01/04/2017, 02/17/2017   Pneumococcal-Unspecified 02/17/2017   Zoster Recombinat (Shingrix) 12/07/2017, 02/13/2018    Conditions to be addressed/monitored:  Hypertension, Atrial Fibrillation, and Heart Failure  There are no care plans that you recently modified to display for this patient.    Medication Assistance: {MEDASSISTANCEINFO:25044}  Compliance/Adherence/Medication fill history: Care Gaps:   Star-Rating Drugs:   Patient's preferred pharmacy is:  [facility] [address] [place] [address] Phone: [phone] Fax: [phone]  Uses pill box? Yes Pt endorses ***% compliance  We discussed: {Pharmacy options:24294} Patient decided to: {US Pharmacy Plan:23885}  Care Plan and Follow Up Patient Decision:  {FOLLOWUP:24991}  Plan: {CM FOLLOW UP YELY:59093}    Current Barriers:  {pharmacybarriers:24917}  Pharmacist Clinical Goal(s):  Patient will {PHARMACYGOALCHOICES:24921} through collaboration with PharmD and provider.   Interventions: 1:1 collaboration with [name], MD regarding development and update of comprehensive plan of care as evidenced by provider attestation and  co-signature Inter-disciplinary care team collaboration (see longitudinal plan of care) Comprehensive medication review performed; medication list updated in electronic medical record  Hypertension (BP goal {CHL HP UPSTREAM Pharmacist BP [phone]}) -{US controlled/uncontrolled:25276} -Current treatment:  -Medications previously tried: ***  -Current home readings: *** -Current dietary habits: *** -Current exercise habits: *** -{ACTIONS;DENIES/REPORTS:21021675} hypotensive/hypertensive symptoms -Educated on {CCM BP Counseling:25124} -Counseled to monitor BP at home ***, document, and provide log at future appointments -{CCMPHARMDINTERVENTION:25122}  Atrial Fibrillation (Goal: prevent stroke and major bleeding) -{US controlled/uncontrolled:25276} -CHADSVASC: *** -Current treatment: Rate control: *** Anticoagulation: *** -Medications previously tried: *** -Home BP and HR readings: ***  -Counseled on {CCMAFIBCOUNSELING:25120} -{CCMPHARMDINTERVENTION:25122}  Heart Failure (Goal: manage symptoms and prevent exacerbations) -{US controlled/uncontrolled:25276} -Last ejection fraction: *** (Date: ***) -HF type: {type of heart failure:30421350} -NYHA Class: {CHL HP Upstream Pharm NYHA Class:[phone]} -AHA HF Stage: {CHL HP Upstream Pharm AHA HF Stage:336 200 7813} -Current treatment:  -Medications previously tried: ***  -Current home BP/HR readings: *** -Current dietary habits: *** -Current exercise habits: *** -Educated on {CCM HF Counseling:25125} -{CCMPHARMDINTERVENTION:25122}  Patient Goals/Self-Care Activities Patient will:  - {pharmacypatientgoals:24919}  Follow Up Plan: {CM FOLLOW UP JPET:62446}

## 2020-10-08 ENCOUNTER — Telehealth: Payer: Self-pay | Admitting: Family Medicine

## 2020-10-08 NOTE — Telephone Encounter (Signed)
Pt's wife returning a phone call. Pt's wife can be reached at [phone]

## 2020-10-08 NOTE — Telephone Encounter (Signed)
Left message for patient to call back and schedule Medicare Annual Wellness Visit (AWV) in office.   If not able to come in office, please offer to do virtually or by telephone.   Last AWV: 06/04/2019  Please schedule at anytime with [facility]-Nurse Health Advisor.  If any questions, please contact me at [phone]

## 2020-10-08 NOTE — Telephone Encounter (Signed)
Attempted to call pt's wife Katharine Look but unable to reach. Left message for her to return call.

## 2020-10-09 NOTE — Telephone Encounter (Signed)
Fine with me but needs 30 min slot and all meds/inhalers  in hand at [facility]

## 2020-10-09 NOTE — Telephone Encounter (Signed)
Will await Dr. Juanetta Gosling response.

## 2020-10-09 NOTE — Telephone Encounter (Signed)
Patient's spouse, Sandra(DPR) is aware that we will contact once we have a response from Dr. Halford Chessman.  She voiced her understanding

## 2020-10-13 ENCOUNTER — Telehealth: Payer: Self-pay | Admitting: Pulmonary Disease

## 2020-10-13 ENCOUNTER — Telehealth: Payer: Self-pay

## 2020-10-13 NOTE — Telephone Encounter (Signed)
ATC voicemail is full. Dr. Halford Chessman will you advise on pt switching to Dr. [name].

## 2020-10-13 NOTE — Telephone Encounter (Signed)
I am okay with switch. 

## 2020-10-14 NOTE — Telephone Encounter (Signed)
ATC no voicemail.

## 2020-10-16 ENCOUNTER — Other Ambulatory Visit (HOSPITAL_COMMUNITY): Payer: Self-pay

## 2020-10-16 ENCOUNTER — Other Ambulatory Visit (HOSPITAL_COMMUNITY)
Admission: RE | Admit: 2020-10-16 | Discharge: 2020-10-16 | Disposition: A | Discharge status: Home / Self Care | Payer: PPO | Source: Ambulatory Visit | Attending: Internal Medicine | Admitting: Internal Medicine

## 2020-10-16 ENCOUNTER — Ambulatory Visit: Payer: PPO | Admitting: Internal Medicine

## 2020-10-16 ENCOUNTER — Other Ambulatory Visit: Payer: Self-pay

## 2020-10-16 ENCOUNTER — Encounter: Payer: Self-pay | Admitting: *Deleted

## 2020-10-16 ENCOUNTER — Encounter: Payer: Self-pay | Admitting: Internal Medicine

## 2020-10-16 ENCOUNTER — Ambulatory Visit (HOSPITAL_COMMUNITY)
Admission: RE | Admit: 2020-10-16 | Discharge: 2020-10-16 | Disposition: A | Discharge status: Home / Self Care | Payer: PPO | Source: Ambulatory Visit | Attending: Internal Medicine | Admitting: Internal Medicine

## 2020-10-16 DIAGNOSIS — R0600 Dyspnea, unspecified: Secondary | ICD-10-CM

## 2020-10-16 DIAGNOSIS — J45991 Cough variant asthma: Secondary | ICD-10-CM

## 2020-10-16 DIAGNOSIS — R0609 Other forms of dyspnea: Secondary | ICD-10-CM | POA: Insufficient documentation

## 2020-10-16 DIAGNOSIS — R059 Cough, unspecified: Secondary | ICD-10-CM | POA: Diagnosis not present

## 2020-10-16 DIAGNOSIS — I517 Cardiomegaly: Secondary | ICD-10-CM | POA: Diagnosis not present

## 2020-10-16 DIAGNOSIS — J9612 Chronic respiratory failure with hypercapnia: Secondary | ICD-10-CM | POA: Diagnosis not present

## 2020-10-16 DIAGNOSIS — J189 Pneumonia, unspecified organism: Secondary | ICD-10-CM | POA: Diagnosis not present

## 2020-10-16 LAB — CBC WITH DIFFERENTIAL/PLATELET
Abs Immature Granulocytes: 0.02 10*3/uL (ref 0.00–0.07)
Basophils Absolute: 0.1 10*3/uL (ref 0.0–0.1)
Basophils Relative: 1 %
Eosinophils Absolute: 0.2 10*3/uL (ref 0.0–0.5)
Eosinophils Relative: 3 %
HCT: 49.8 % (ref 39.0–52.0)
Hemoglobin: 15.9 g/dL (ref 13.0–17.0)
Immature Granulocytes: 0 %
Lymphocytes Relative: 20 %
Lymphs Abs: 1.6 10*3/uL (ref 0.7–4.0)
MCH: 32.4 pg (ref 26.0–34.0)
MCHC: 31.9 g/dL (ref 30.0–36.0)
MCV: 101.4 fL — ABNORMAL HIGH (ref 80.0–100.0)
Monocytes Absolute: 0.8 10*3/uL (ref 0.1–1.0)
Monocytes Relative: 10 %
Neutro Abs: 5.4 10*3/uL (ref 1.7–7.7)
Neutrophils Relative %: 66 %
Platelets: 142 10*3/uL — ABNORMAL LOW (ref 150–400)
RBC: 4.91 MIL/uL (ref 4.22–5.81)
RDW: 14 % (ref 11.5–15.5)
WBC: 8.1 10*3/uL (ref 4.0–10.5)
nRBC: 0 % (ref 0.0–0.2)

## 2020-10-16 LAB — TSH: TSH: 0.978 u[IU]/mL (ref 0.350–4.500)

## 2020-10-16 LAB — BASIC METABOLIC PANEL
Anion gap: 6 (ref 5–15)
BUN: 12 mg/dL (ref 8–23)
CO2: 33 mmol/L — ABNORMAL HIGH (ref 22–32)
Calcium: 9.1 mg/dL (ref 8.9–10.3)
Chloride: 103 mmol/L (ref 98–111)
Creatinine, Ser: 0.7 mg/dL (ref 0.61–1.24)
GFR, Estimated: >60 mL/min (ref >60)
Glucose, Bld: 88 mg/dL (ref 70–99)
Potassium: 4.1 mmol/L (ref 3.5–5.1)
Sodium: 142 mmol/L (ref 135–145)

## 2020-10-16 LAB — BRAIN NATRIURETIC PEPTIDE: B Natriuretic Peptide: 131 pg/mL — ABNORMAL HIGH (ref 0.0–100.0)

## 2020-10-16 LAB — D-DIMER, QUANTITATIVE: D-Dimer, Quant: 0.38 ug/mL-FEU (ref 0.00–0.50)

## 2020-10-16 MED ORDER — BUDESONIDE-FORMOTEROL FUMARATE 80-4.5 MCG/ACT IN AERO
2.0000 | INHALATION_SPRAY | Freq: Two times a day (BID) | RESPIRATORY_TRACT | 11 refills | Status: DC
  Filled 2020-10-16: qty 10.2, 30d supply, fill #0

## 2020-10-16 NOTE — Assessment & Plan Note (Signed)
Body mass index is 46.78 kg/m.    Lab Results  Component Value Date   TSH 0.978 10/16/2020     Contributing to   Doe plus risk of gerd >>> reviewed the need and the process to achieve and maintain neg calorie balance > defer f/u primary care including intermittently monitoring thyroid status     Each maintenance medication was reviewed in detail including emphasizing most importantly the difference between maintenance and prns and under what circumstances the prns are to be triggered using an action plan format where appropriate.  Total time for H and P, chart review, counseling, reviewing hfa device(s) , directly observing portions of ambulatory 02 saturation study/ and generating customized AVS unique to this office visit / same day charting > 60 min

## 2020-10-16 NOTE — Assessment & Plan Note (Signed)
Onset Nov 2021 p ET  - 10/16/2020   trial of symbicort 80 2bid plus otc gerd rx /diet x 6weeks then ov with pfts

## 2020-10-16 NOTE — Progress Notes (Signed)
Garrett Munoz, male    DOB: 1945/06/26,   MRN: [id number]   Brief patient profile:  65 yowm quit smoking  1993 at wt 225 and walked up to 3 miles a day and no need for resp rx but slowed down by orthopedic issues much worse since Nov 2021 much worse   Admit date: 03/09/2020 Discharge date: 03/13/2020  Hospital Course: Garrett Munoz is a 75 yo male with PMH chronic A. fib on Eliquis, multiple joints OA, chronic bronchitis, morbid obesity, hypertension, gout who presented with SOB and swelling. Patient has chronic A. fib has been following with cardiologist Dr. Claiborne Billings, most recent visit was January this year.  No history of CHF documented.  And according to patient's wife, patient's A. fib has been fairly controlled.  Recently patient has had worsening of back pain for which he has been following with pain management doctor.  No new medication recent months.   Patient started to have increasing swelling on both legs 3 to 4 weeks ago, along with increasing shortness of breath initially was exertional and became worse this week.  Wife also noticed patient has had increasing cough and wheezing since last week has been using more albuterol nebulizer, patient also has history of OSA not been compliant with CPAP.  Denies any chest pain, no fever chills, no chest pain no urinary symptoms no diarrhea.  Wife also noticed that the patient has been having " fingers nails and toes discoloration recently"   Chest x-ray showed cardiomegaly and pulmonary edema, patient was found to be hypoxic.  Patient became hypoxic in the ED, very agitated remove oxygen and O2 sat ration dropped to upper 50s and then became lethargic. He was eventually placed on BiPAP. He had hypercarbic respiratory failure on blood gas monitoring which improved with ongoing BiPAP therapy. He was also started on diuresis with Lasix given his volume overload. An echo was also ordered. EF 60-65%, Severe LVH.  Mildly reduced right ventricular  systolic function. Pulmonology was consulted on admission as well. He will need outpatient formal PFTs. Prior to discharge he was able to be weaned off of oxygen to room air and ambulated well in the hallway with no desaturation events.  He was also arranged for home health as well as NIV but has a scheduled sleep study at time of discharge as well.     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 Needs to be adequately diuresed then obtain outpatient PFTs -Required continuous BiPAP on admission which has now been weaned off. Continue nightly BiPAP as well as as needed -Continue Lasix -Appreciate pulmonology evaluation -Discontinued doxycycline and steroids -Continue duo nebs for now   Acute CHF (congestive heart failure) ([place]) -Patient had clinical signs of volume overload on admission. BNP elevated, 261 -Echo also obtained on admission: EF 60 to 65%, severe LVH. Diastolic dysfunction could not be evaluated. RV systolic function mildly reduced. No RVH. - continue lasix   Acute metabolic encephalopathy-resolved as of 03/10/2020 -Likely due to hypercarbia on admission. This has improved -Continue monitoring for any further mentation changes   Physical deconditioning -We discussed his current status at length bedside with him and his wife.  He is declining going to SNF/rehab at this time and wishes to go home with home health.  Wife also bedside for the conversation and in agreement and comfortable taking patient home.  Informed patient that he  could change his mind if he wanted rehab at any point.  For now, tentative plan will be for home with home health PT at discharge   Osteoarthritis - continue current pain regimen; caution with oversedation    Prediabetes -A1c 5.8% -Continue diet control   Atrial fibrillation (HCC) -Continue Coreg and Eliquis    Gout -Uric acid level 4.3. Continue to avoid HCTZ   History of Present Illness  10/16/2020  Pulmonary/ 1st office eval/ Garrett Munoz / [facility]  PMR prednisone 10 mg daily  Chief Complaint  Patient presents with   Follow-up    Patient reports that he had a tube placed  in his throat and had 32 pounds of fluid and has not felt like he can catch his breath, He reports that he is coughing some green-brown sputum at times,   Dyspnea:  [employer] walking limited by energy and cough  Cough: since Nov 2021 esp in am p stirs assoc nasal congestion Sleep: 30 degrees / uses no 02 or cpap/bipap SABA use: seems to help cough better than anything   No obvious day to day or daytime variability or assoc excess/ purulent sputum or mucus plugs or hemoptysis or cp or chest tightness, subjective wheeze or overt sinus or hb symptoms.   sleeping without nocturnal  or early am exacerbation  of respiratory  c/o's or need for noct saba. Also denies any obvious fluctuation of symptoms with weather or environmental changes or other aggravating or alleviating factors except as outlined above   No unusual exposure hx or h/o childhood pna/ asthma or knowledge of premature birth.  Current Allergies, Complete Past Medical History, Past Surgical History, Family History, and Social History were reviewed in [employer] record.  ROS  The following are not active complaints unless bolded Hoarseness, sore throat, dysphagia, dental problems, itching, sneezing,  nasal congestion or discharge of excess mucus or purulent secretions, ear ache,   fever, chills, sweats, unintended wt loss or wt gain, classically pleuritic or exertional cp,  orthopnea pnd or arm/hand swelling  or leg swelling, presyncope, palpitations, abdominal pain, anorexia, nausea, vomiting, diarrhea  or change in bowel habits or change in bladder habits, change in stools or change in urine, dysuria, hematuria,  rash, arthralgias, visual  complaints, headache, numbness, weakness or ataxia or problems with walking or coordination,  change in mood or  memory.                   Past Medical History:  Diagnosis Date   Atrial fibrillation ([place])    Bronchitis    hx   Cataract    right eye   CHF NYHA class III, acute on chronic, diastolic (HCC)    COPD (chronic obstructive pulmonary disease) (HCC)    DDD (degenerative disc disease), lumbosacral    Dysrhythmia    irregular heatbeat   Eye worm    right eye   Gout    History of kidney stones    Hypertension    Left knee DJD    Morbid obesity with BMI of 40.0-44.9, adult (HCC)    Pneumonia    hx   Prediabetes    Radicular syndrome of left leg    S/P total knee replacement    right   Sleep apnea    can't use sleep study 10 yrs ago   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     Outpatient Medications Prior to Visit  Medication Sig Dispense Refill   albuterol (VENTOLIN HFA)  108 (90 Base) MCG/ACT inhaler Inhale 2 puffs into the lungs every 6 (six) hours as needed for wheezing or shortness of breath. 8.5 g 0   allopurinol (ZYLOPRIM) 300 MG tablet TAKE 1 TABLET (300 MG TOTAL) BY MOUTH DAILY. 90 tablet 3   apixaban (ELIQUIS) 5 MG TABS tablet TAKE 1 TABLET (5 MG TOTAL) BY MOUTH 2 (TWO) TIMES DAILY. 180 tablet 1   benzonatate (TESSALON) 200 MG capsule Take 1 capsule (200 mg total) by mouth 2 (two) times daily as needed for cough. 20 capsule 0   carvedilol (COREG) 12.5 MG tablet TAKE 1 & 1/2 TABLETS BY MOUTH EVERY MORNING AND 1 TABLET IN THE EVENING 225 tablet 3   fluticasone (FLONASE) 50 MCG/ACT nasal spray SHAKE LIQUID AND USE 2 SPRAYS IN EACH NOSTRIL DAILY (Patient taking differently: Place 2 sprays into both nostrils daily as needed for allergies or rhinitis.) 16 g 6   furosemide (LASIX) 20 MG tablet TAKE 1 TABLET (20 MG TOTAL) BY MOUTH DAILY. 30 tablet 3   gabapentin (NEURONTIN) 300 MG capsule TAKE 2 CAPSULES (600 MG TOTAL) BY MOUTH 3 (THREE) TIMES DAILY. 540 capsule 3    levocetirizine (XYZAL) 5 MG tablet Take 1 tablet (5 mg total) by mouth every evening. 90 tablet 3   losartan (COZAAR) 25 MG tablet TAKE 1 TABLET (25 MG TOTAL) BY MOUTH DAILY. 90 tablet 3   montelukast (SINGULAIR) 10 MG tablet Take 1 tablet (10 mg total) by mouth at bedtime. 90 tablet 3   Multiple Vitamins-Minerals (MULTIVITAMIN WITH MINERALS) tablet Take 1 tablet by mouth daily. 120 tablet 2   predniSONE (DELTASONE) 10 MG tablet Take 1 tablet (10 mg total) by mouth daily with breakfast. 90 tablet 1   temazepam (RESTORIL) 30 MG capsule Take 1 capsule (30 mg total) by mouth at bedtime as needed for sleep 30 capsule 2   azithromycin (ZITHROMAX) 250 MG tablet Take 2 tablets by mouth on day 1, then take 1 tablet by mouth daily for 4 days (Patient not taking: Reported on 10/16/2020) 6 tablet 0   No facility-administered medications prior to visit.     Objective:     BP 120/80 (BP Location: Left Arm, Cuff Size: Normal)   Pulse 70   SpO2 94% Comment: RA  SpO2: 94 % (RA)  Amb slt hoarse MO wm nad   HEENT : pt wearing mask not removed for exam due to covid -19 concerns.    NECK :  without JVD/Nodes/TM/ nl carotid upstrokes bilaterally   LUNGS: no acc muscle use,  Nl contour chest which is clear to A and P bilaterally without cough on insp or exp maneuvers   CV:  RRR  no s3 or murmur or increase in P2, and  1+pitting edema sym bilaterally    ABD:   tensely obese  nontender with nl inspiratory excursion in the supine position. No bruits or organomegaly appreciated, bowel sounds nl  MS:  Nl gait/ ext warm without deformities, calf tenderness, cyanosis or clubbing No obvious joint restrictions   SKIN: warm and dry without lesions    NEURO:  alert, approp, nl sensorium with  no motor or cerebellar deficits apparent.    CXR PA and Lateral:   10/16/2020 :    I personally reviewed images  / impression as follows:   CM/ low lung volumes, non-specific increase in markings    Labs ordered/  reviewed:      Chemistry      Component Value Date/Time  NA 142 10/16/2020 1143   NA 148 (H) 12/05/2015 0901   K 4.1 10/16/2020 1143   CL 103 10/16/2020 1143   CO2 33 (H) 10/16/2020 1143   BUN 12 10/16/2020 1143   BUN 11 12/05/2015 0901   CREATININE 0.70 10/16/2020 1143   CREATININE 0.84 05/15/2020 1144      Component Value Date/Time   CALCIUM 9.1 10/16/2020 1143   ALKPHOS 57 03/09/2020 1228   AST 18 05/15/2020 1144   ALT 18 05/15/2020 1144   BILITOT 0.4 05/15/2020 1144   BILITOT 0.3 12/05/2015 0901        Lab Results  Component Value Date   WBC 8.1 10/16/2020   HGB 15.9 10/16/2020   HCT 49.8 10/16/2020   MCV 101.4 (H) 10/16/2020   PLT 142 (L) 10/16/2020       EOS                                                               0.2                                   10/16/2020   Lab Results  Component Value Date   DDIMER 0.38 10/16/2020      Lab Results  Component Value Date   TSH 0.978 10/16/2020     BNP   10/16/2020   131         Assessment   No problem-specific Assessment & Plan notes found for this encounter.     Christinia Gully, MD 10/16/2020

## 2020-10-16 NOTE — Telephone Encounter (Signed)
Appt with MW scheduled for today as Dr Halford Chessman and Dr [name] have already approved the switch.

## 2020-10-16 NOTE — Assessment & Plan Note (Signed)
Onset Nov 2021 assoc with rapid wt gain  - echo 03/10/20  Severe LVH/ mild/mod LAE  -  10/16/2020   Walked RA  approx  200 ft  @ moderate pace  stopped due to  02 sats 85-86%   Labs ok so most likely this is all   Related to obesity/ diastolic dysfunction and not lung dz  F/u in 6 weeks with pfts

## 2020-10-16 NOTE — Patient Instructions (Addendum)
Plan A = Automatic = Always=    Symbicort 80 Take 2 puffs first thing in am and then another 2 puffs about 12 hours later.    Work on inhaler technique:  relax and gently blow all the way out then take a nice smooth full deep breath back in, triggering the inhaler at same time you start breathing in.  Hold for up to 5 seconds if you can. Blow out thru nose. Rinse and gargle with water when done.  If mouth or throat bother you at all,  try brushing teeth/gums/tongue with arm and hammer toothpaste/ make a slurry and gargle and spit out.      Plan B = Backup (to supplement plan A, not to replace it) Only use your albuterol inhaler as a rescue medication to be used if you can't catch your breath by resting or doing a relaxed purse lip breathing pattern.  - The less you use it, the better it will work when you need it. - Ok to use the inhaler up to 2 puffs  every 4 hours if you must but call for appointment if use goes up over your usual need - Don't leave home without it !!  (think of it like the spare tire for your car)   Also ok to Try albuterol 15 min before an activity (on alternating days)  that you know would make you short of breath and see if it makes any difference and if makes none then don't take albuterol after activity unless you can't catch your breath as this means it's the resting that helps, not the albuterol.      Try prilosec otc 20mg   Take 30-60 min before first meal of the day and Pepcid ac (famotidine) 20 mg one after supper until return.  GERD (REFLUX)  is an extremely common cause of respiratory symptoms just like yours , many times with no obvious heartburn at all.    It can be treated with medication, but also with lifestyle changes including elevation of the head of your bed (ideally with 6 -8inch blocks under the headboard of your bed),  Smoking cessation, avoidance of late meals, excessive alcohol, and avoid fatty foods, chocolate, peppermint, colas, red wine, and acidic  juices such as orange juice.  NO MINT OR MENTHOL PRODUCTS SO NO COUGH DROPS  USE SUGARLESS CANDY INSTEAD (Jolley ranchers or Stover's or Life Savers) or even ice chips will also do - the key is to swallow to prevent all throat clearing. NO OIL BASED VITAMINS - use powdered substitutes.  Avoid fish oil when coughing.   Please remember to go to the lab and x-ray department at [facility]   for your tests - we will call you with the results when they are available.        Please schedule a follow up office visit in 6 weeks, call sooner if needed with all medications /inhalers/ solutions in hand so we can verify exactly what you are taking. This includes all medications from all doctors and over the counters

## 2020-10-16 NOTE — Telephone Encounter (Signed)
Garrett Munoz wife is returning phone call. Garrett Munoz phone number is [phone].

## 2020-10-16 NOTE — Assessment & Plan Note (Signed)
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

## 2020-10-21 ENCOUNTER — Encounter: Payer: Self-pay | Admitting: *Deleted

## 2020-10-21 ENCOUNTER — Telehealth: Payer: Self-pay | Admitting: Internal Medicine

## 2020-10-21 LAB — IGE: IgE (Immunoglobulin E), Serum: 512 IU/mL — ABNORMAL HIGH (ref 6–495)

## 2020-10-21 NOTE — Telephone Encounter (Signed)
Called and spoke with patient's wife per DPR to go over CXR and lab results. She expressed understanding. Nothing further needed at this time. Confirmed appointment for next month

## 2020-11-03 ENCOUNTER — Telehealth: Payer: Self-pay | Admitting: Internal Medicine

## 2020-11-03 ENCOUNTER — Other Ambulatory Visit (HOSPITAL_COMMUNITY): Payer: Self-pay

## 2020-11-03 MED ORDER — AMOXICILLIN-POT CLAVULANATE 875-125 MG PO TABS
1.0000 | ORAL_TABLET | Freq: Two times a day (BID) | ORAL | 0 refills | Status: DC
  Filled 2020-11-03: qty 20, 10d supply, fill #0

## 2020-11-03 NOTE — Telephone Encounter (Signed)
Spoke with the pt's spouse  She states pt having increased DOE and coughing up green sputum mixed with blood off and on past 4-5 days  He gets winded walking through the house  He does not have any fever or body aches  Has been vaxxed against covid x 4 and home test 7/17 was neg  He is still on symbicort, tessalon, pred 10 mg daily, singulair, and he is taking eliquis bid  No openings in [place] until 11/06/20  No Known Allergies

## 2020-11-03 NOTE — Telephone Encounter (Signed)
Double prednisone and bring him in 7/21 with all meds in hand using a trust but verify approach to confirm accurate Medication  Reconciliation The principal here is that until we are certain that the  patients are doing what we've asked, it makes no sense to ask them to do more.   Augmentin 875 mg take one pill twice daily  X 10 days - take at breakfast and supper with large glass of water.  It would help reduce the usual side effects (diarrhea and yeast infections) if you ate cultured yogurt at lunch.

## 2020-11-03 NOTE — Telephone Encounter (Signed)
I called and spoke with pt's spouse  Appt scheduled in [place] 11/06/20 and aware of new address and to bring meds  Abx sent to pharm and pt has plenty of pred

## 2020-11-05 ENCOUNTER — Other Ambulatory Visit (HOSPITAL_COMMUNITY): Payer: Self-pay

## 2020-11-06 ENCOUNTER — Other Ambulatory Visit: Payer: Self-pay

## 2020-11-06 ENCOUNTER — Encounter: Payer: Self-pay | Admitting: Internal Medicine

## 2020-11-06 ENCOUNTER — Ambulatory Visit: Payer: PPO | Admitting: Internal Medicine

## 2020-11-06 DIAGNOSIS — J45991 Cough variant asthma: Secondary | ICD-10-CM

## 2020-11-06 DIAGNOSIS — J9612 Chronic respiratory failure with hypercapnia: Secondary | ICD-10-CM

## 2020-11-06 NOTE — Patient Instructions (Addendum)
Work on inhaler technique:  relax and gently blow all the way out then take a nice smooth full deep breath back in, triggering the inhaler at same time you start breathing in.  Hold for up to 5 seconds if you can. Rinse and gargle with water when done.  If mouth or throat bother you at all,  try brushing teeth/gums/tongue with arm and hammer toothpaste/ make a slurry and gargle and spit out.   Ok to try off symbicort and just use the empty container to train on  Only use your albuterol as a rescue medication to be used if you can't catch your breath by resting or doing a relaxed purse lip breathing pattern.  - The less you use it, the better it will work when you need it. - Ok to use up to 2 puffs  every 4 hours if you must but call for immediate appointment if use goes up over your usual need - Don't leave home without it !!  (think of it like the spare tire for your car)   If needed, ok to change over nebulizer every 4 hours as needed    We will call to arrange ENT([place])  and allergy ([place])   Keep previous appt   .

## 2020-11-06 NOTE — Progress Notes (Signed)
Paxton, Binns, male    DOB: 1946/01/10,   MRN: [id number]   Brief patient profile:  58 yowm quit smoking  1993 at wt 225 and walked up to 3 miles a day and no need for resp rx but slowed down by orthopedic issues much worse since Nov 2021 much worse   Admit date: 03/09/2020 Discharge date: 03/13/2020  Hospital Course: Mr. Osbourne is a 75 yo male with PMH chronic A. fib on Eliquis, multiple joints OA, chronic bronchitis, morbid obesity, hypertension, gout who presented with SOB and swelling. Patient has chronic A. fib has been following with cardiologist Dr. Claiborne Billings, most recent visit was January this year.  No history of CHF documented.  And according to patient's wife, patient's A. fib has been fairly controlled.  Recently patient has had worsening of back pain for which he has been following with pain management doctor.  No new medication recent months.   Patient started to have increasing swelling on both legs 3 to 4 weeks ago, along with increasing shortness of breath initially was exertional and became worse this week.  Wife also noticed patient has had increasing cough and wheezing since last week has been using more albuterol nebulizer, patient also has history of OSA not been compliant with CPAP.  Denies any chest pain, no fever chills, no chest pain no urinary symptoms no diarrhea.  Wife also noticed that the patient has been having " fingers nails and toes discoloration recently"   Chest x-ray showed cardiomegaly and pulmonary edema, patient was found to be hypoxic.  Patient became hypoxic in the ED, very agitated remove oxygen and O2 sat ration dropped to upper 50s and then became lethargic. He was eventually placed on BiPAP. He had hypercarbic respiratory failure on blood gas monitoring which improved with ongoing BiPAP therapy. He was also started on diuresis with Lasix given his volume overload. An echo was also ordered. EF 60-65%, Severe LVH.  Mildly reduced right ventricular  systolic function. Pulmonology was consulted on admission as well. He will need outpatient formal PFTs. Prior to discharge he was able to be weaned off of oxygen to room air and ambulated well in the hallway with no desaturation events.  He was also arranged for home health as well as NIV but has a scheduled sleep study at time of discharge as well.     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 Needs to be adequately diuresed then obtain outpatient PFTs -Required continuous BiPAP on admission which has now been weaned off. Continue nightly BiPAP as well as as needed -Continue Lasix -Appreciate pulmonology evaluation -Discontinued doxycycline and steroids -Continue duo nebs for now   Acute CHF (congestive heart failure) ([place]) -Patient had clinical signs of volume overload on admission. BNP elevated, 261 -Echo also obtained on admission: EF 60 to 65%, severe LVH. Diastolic dysfunction could not be evaluated. RV systolic function mildly reduced. No RVH. - continue lasix   Acute metabolic encephalopathy-resolved as of 03/10/2020 -Likely due to hypercarbia on admission. This has improved -Continue monitoring for any further mentation changes   Physical deconditioning -We discussed his current status at length bedside with him and his wife.  He is declining going to SNF/rehab at this time and wishes to go home with home health.  Wife also bedside for the conversation and in agreement and comfortable taking patient home.  Informed patient that  he could change his mind if he wanted rehab at any point.  For now, tentative plan will be for home with home health PT at discharge   Osteoarthritis - continue current pain regimen; caution with oversedation    Prediabetes -A1c 5.8% -Continue diet control   Atrial fibrillation (HCC) -Continue Coreg and Eliquis    Gout -Uric acid level 4.3. Continue to avoid HCTZ   History of Present Illness  10/16/2020  Pulmonary/ 1st office eval/ Talma Aguillard / [facility] [facility]  PMR prednisone 10 mg daily  Chief Complaint  Patient presents with   Follow-up    Patient reports that he had a tube placed  in his throat and had 32 pounds of fluid and has not felt like he can catch his breath, He reports that he is coughing some green-brown sputum at times,   Dyspnea:  [employer] walking limited by energy and cough  Cough: since Nov 2021 esp in am p stirs assoc nasal congestion Sleep: 30 degrees / uses no 02 or cpap/bipap SABA use: seems to help cough better than anything Rec Plan A = Automatic = Always=    Symbicort 80 Take 2 puffs first thing in am and then another 2 puffs about 12 hours later.  Work on inhaler technique:   Plan B = Backup (to supplement plan A, not to replace it) Only use your albuterol inhaler as a rescue medication Also ok to Try albuterol 15 min before an activity (on alternating days)  that you know would make you short of breath     Try prilosec otc 20mg   Take 30-60 min before first meal of the day and Pepcid ac (famotidine) 20 mg one after supper until return. GERD diet reviewed, bed blocks rec     PC recs  11/03/20 cough with green sputum x 5 days and worse doe  Double prednisone and bring him in 7/21 with all meds in hand using a trust but verify approach to confirm accurate Medication  Reconciliation The principal here is that until we are certain that the  patients are doing what we've asked, it makes no sense to ask them to do more.  Augmentin 875 mg take one pill twice daily  X 10 days - take at breakfast and supper with large glass of water.  It would help reduce the usual side effects (diarrhea and yeast infections) if you ate cultured yogurt at lunch.    11/06/2020  f/u from Acute Phone care as above ov/[place] office/Joyleen Haselton re: worse sob / PMR so chronic steroid dep still at 20 mg pred and  feels worse when uses symbicort due to thorat irritation Chief Complaint  Patient presents with   Follow-up    Patient reports that he is doing better and is not having as many coughing spells.    Dyspnea:  mostly house boud due to heat  Cough: better until flared with green mucus as above, some better on augmenti)  Sleeping: elevated / can't tol cpap ([facility] rx)  SABA use: 1-2 puffs a day - neb 7/16  02: none      No obvious day to day or daytime variability or assoc   mucus plugs or hemoptysis or cp or chest tightness, subjective wheeze or overt sinus or hb symptoms.      Also denies any obvious fluctuation of symptoms with weather or environmental changes or other aggravating or alleviating factors except as outlined above   No unusual exposure hx or h/o childhood pna/ asthma or  knowledge of premature birth.  Current Allergies, Complete Past Medical History, Past Surgical History, Family History, and Social History were reviewed in [employer] record.  ROS  The following are not active complaints unless bolded Hoarseness, sore throat, dysphagia, dental problems, itching, sneezing,  nasal congestion or discharge of excess mucus or purulent secretions, ear ache,   fever, chills, sweats, unintended wt loss or wt gain, classically pleuritic or exertional cp,  orthopnea pnd or arm/hand swelling  or leg swelling, presyncope, palpitations, abdominal pain, anorexia, nausea, vomiting, diarrhea  or change in bowel habits or change in bladder habits, change in stools or change in urine, dysuria, hematuria,  rash, arthralgias, visual complaints, headache, numbness, weakness or ataxia or problems with walking or coordination,  change in mood or  memory.        Current Meds  Medication Sig   albuterol (VENTOLIN HFA) 108 (90 Base) MCG/ACT inhaler Inhale 2 puffs into the lungs every 6 (six) hours as needed for wheezing or shortness of breath.   allopurinol (ZYLOPRIM) 300 MG  tablet TAKE 1 TABLET (300 MG TOTAL) BY MOUTH DAILY.   amoxicillin-clavulanate (AUGMENTIN) 875-125 MG tablet Take 1 tablet by mouth 2 (two) times daily.   apixaban (ELIQUIS) 5 MG TABS tablet TAKE 1 TABLET (5 MG TOTAL) BY MOUTH 2 (TWO) TIMES DAILY.   budesonide-formoterol (SYMBICORT) 80-4.5 MCG/ACT inhaler Inhale 2 puffs into the lungs 2 (two) times daily.   carvedilol (COREG) 12.5 MG tablet TAKE 1 & 1/2 TABLETS BY MOUTH EVERY MORNING AND 1 TABLET IN THE EVENING   fluticasone (FLONASE) 50 MCG/ACT nasal spray SHAKE LIQUID AND USE 2 SPRAYS IN EACH NOSTRIL DAILY (Patient taking differently: Place 2 sprays into both nostrils daily as needed for allergies or rhinitis.)   furosemide (LASIX) 20 MG tablet TAKE 1 TABLET (20 MG TOTAL) BY MOUTH DAILY.   gabapentin (NEURONTIN) 300 MG capsule TAKE 2 CAPSULES (600 MG TOTAL) BY MOUTH 3 (THREE) TIMES DAILY.   levocetirizine (XYZAL) 5 MG tablet Take 1 tablet (5 mg total) by mouth every evening.   losartan (COZAAR) 25 MG tablet TAKE 1 TABLET (25 MG TOTAL) BY MOUTH DAILY.   montelukast (SINGULAIR) 10 MG tablet Take 1 tablet (10 mg total) by mouth at bedtime.   Multiple Vitamins-Minerals (MULTIVITAMIN WITH MINERALS) tablet Take 1 tablet by mouth daily.   predniSONE (DELTASONE) 10 MG tablet Take 1 tablet (10 mg total) by mouth daily with breakfast. (Patient taking differently: Take 20 mg by mouth daily with breakfast. Take 20 mg daily per patient.)   temazepam (RESTORIL) 30 MG capsule Take 1 capsule (30 mg total) by mouth at bedtime as needed for sleep                           Past Medical History:  Diagnosis Date   Atrial fibrillation (HCC)    Bronchitis    hx   Cataract    right eye   CHF NYHA class III, acute on chronic, diastolic (HCC)    COPD (chronic obstructive pulmonary disease) (HCC)    DDD (degenerative disc disease), lumbosacral    Dysrhythmia    irregular heatbeat   Eye worm    right eye   Gout    History of kidney stones     Hypertension    Left knee DJD    Morbid obesity with BMI of 40.0-44.9, adult (HCC)    Pneumonia    hx   Prediabetes  Radicular syndrome of left leg    S/P total knee replacement    right   Sleep apnea    can't use sleep study 10 yrs ago   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        Objective:      Wt Readings from Last 3 Encounters:  11/06/20 (!) 326 lb (147.9 kg)  10/16/20 (!) 326 lb (147.9 kg)  08/28/20 (!) 323 lb 3.2 oz (146.6 kg)      Vital signs reviewed  11/06/2020  - Note at rest 02 sats  93% on RA   General appearance:    obese wm with classic pseudowheeze     HEENT : pt wearing mask not removed for exam due to covid -19 concerns.    NECK :  without JVD/Nodes/TM/ nl carotid upstrokes bilaterally   LUNGS: no acc muscle use,  Nl contour chest with distant  bilaterally without cough on insp or exp maneuvers/ mostly just transmitted upper airway noise    CV:  RRR  no s3 or murmur or increase in P2, and  1+ pitting edema L > R    ABD:  soft and nontender with nl inspiratory excursion in the supine position. No bruits or organomegaly appreciated, bowel sounds nl  MS:  Nl gait/ ext warm without deformities, calf tenderness, cyanosis or clubbing No obvious joint restrictions   SKIN: warm and dry without lesions    NEURO:  alert, approp, nl sensorium with  no motor or cerebellar deficits apparent.              Assessment

## 2020-11-06 NOTE — Assessment & Plan Note (Signed)
Onset Nov 2021 p ET  - quit smoking 1993 - 10/16/2020   trial of symbicort 80 2bid plus otc gerd rx /diet x 6weeks then ov with pfts  - Allergy profile 10/16/20  >  Eos 0.2 /  IgE  512 - allergy eval 11/06/2020 >>>  - ENT eval vcd 11/06/2020 >>>   - The proper method of use, as well as anticipated side effects, of a metered-dose inhaler were discussed and demonstrated to the patient using teach back method. Improved effectiveness after extensive coaching during this visit to a level of approximately 75 % from a baseline of 25 % continue hfa alb with saba neb backup.   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   These are the same pts (now being labeled as having "irritable larynx syndrome" by some cough centers) who not infrequently have a history of having failed to tolerate ace inhibitors,  dry powder inhalers or biphosphonates or report having atypical/extraesophageal reflux symptoms that don't respond to standard doses of PPI  and are easily confused as having aecopd or asthma flares by even experienced allergists/ pulmonologists (myself included).    rec  finsish pred/ abx Stop symbicort for now and continue gabapentin  Allergy/ ent eval for upper airway concerns  Return for pfts to as planned

## 2020-11-06 NOTE — Assessment & Plan Note (Signed)
desats walking > 100 ft noted 10/16/2020 with HC03 = 33   No need for 02 at this point but probably needs to be on cpap or bipap hs > defer to Dr Claiborne Billings to manage with option to see one of our sleep medicine docs here          Each maintenance medication was reviewed in detail including emphasizing most importantly the difference between maintenance and prns and under what circumstances the prns are to be triggered using an action plan format where appropriate.  Total time for H and P, chart review, counseling, reviewing hfa/neb device(s) and generating customized AVS unique to this office visit / same day charting > 30 min

## 2020-11-27 ENCOUNTER — Encounter: Payer: Self-pay | Admitting: Internal Medicine

## 2020-11-27 ENCOUNTER — Other Ambulatory Visit: Payer: Self-pay

## 2020-11-27 ENCOUNTER — Ambulatory Visit: Payer: PPO | Admitting: Internal Medicine

## 2020-11-27 DIAGNOSIS — J45991 Cough variant asthma: Secondary | ICD-10-CM

## 2020-11-27 DIAGNOSIS — R0609 Other forms of dyspnea: Secondary | ICD-10-CM

## 2020-11-27 DIAGNOSIS — R0600 Dyspnea, unspecified: Secondary | ICD-10-CM | POA: Diagnosis not present

## 2020-11-27 NOTE — Assessment & Plan Note (Signed)
Body mass index is 45.05 kg/m.  -  Trending slt down  Lab Results  Component Value Date   TSH 0.978 10/16/2020      Contributing to doe and risk of GERD >>>   reviewed the need and the process to achieve and maintain neg calorie balance > defer f/u primary care including intermittently monitoring thyroid status             Each maintenance medication was reviewed in detail including emphasizing most importantly the difference between maintenance and prns and under what circumstances the prns are to be triggered using an action plan format where appropriate.  Total time for H and P, chart review, counseling, reviewing hfa/neb device(s) and generating customized AVS unique to this office visit / same day charting > 30 min

## 2020-11-27 NOTE — Progress Notes (Signed)
Garrett Munoz, male    DOB: 1945-05-30,   MRN: [id number]   Brief patient profile:  24 yowm quit smoking  1993 at wt 225 and walked up to 3 miles a day and no need for resp rx but slowed down by orthopedic ( knees/back) issues much worse since Nov 2021 much worse   Admit date: 03/09/2020 Discharge date: 03/13/2020  Hospital Course: Garrett Munoz is a 75 yo male with PMH chronic A. fib on Eliquis, multiple joints OA, chronic bronchitis, morbid obesity, hypertension, gout who presented with SOB and swelling. Patient has chronic A. fib has been following with cardiologist Dr. Claiborne Billings, most recent visit was January this year.  No history of CHF documented.  And according to patient's wife, patient's A. fib has been fairly controlled.  Recently patient has had worsening of back pain for which he has been following with pain management doctor.  No new medication recent months.   Patient started to have increasing swelling on both legs 3 to 4 weeks ago, along with increasing shortness of breath initially was exertional and became worse this week.  Wife also noticed patient has had increasing cough and wheezing since last week has been using more albuterol nebulizer, patient also has history of OSA not been compliant with CPAP.  Denies any chest pain, no fever chills, no chest pain no urinary symptoms no diarrhea.  Wife also noticed that the patient has been having " fingers nails and toes discoloration recently"   Chest x-ray showed cardiomegaly and pulmonary edema, patient was found to be hypoxic.  Patient became hypoxic in the ED, very agitated remove oxygen and O2 sat ration dropped to upper 50s and then became lethargic. He was eventually placed on BiPAP. He had hypercarbic respiratory failure on blood gas monitoring which improved with ongoing BiPAP therapy. He was also started on diuresis with Lasix given his volume overload. An echo was also ordered. EF 60-65%, Severe LVH.  Mildly reduced right  ventricular systolic function. Pulmonology was consulted on admission as well. He will need outpatient formal PFTs. Prior to discharge he was able to be weaned off of oxygen to room air and ambulated well in the hallway with no desaturation events.  He was also arranged for home health as well as NIV but has a scheduled sleep study at time of discharge as well.     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 Needs to be adequately diuresed then obtain outpatient PFTs -Required continuous BiPAP on admission which has now been weaned off. Continue nightly BiPAP as well as as needed -Continue Lasix -Appreciate pulmonology evaluation -Discontinued doxycycline and steroids -Continue duo nebs for now   Acute CHF (congestive heart failure) ([place]) -Patient had clinical signs of volume overload on admission. BNP elevated, 261 -Echo also obtained on admission: EF 60 to 65%, severe LVH. Diastolic dysfunction could not be evaluated. RV systolic function mildly reduced. No RVH. - continue lasix   Acute metabolic encephalopathy-resolved as of 03/10/2020 -Likely due to hypercarbia on admission. This has improved -Continue monitoring for any further mentation changes   Physical deconditioning -We discussed his current status at length bedside with him and his wife.  He is declining going to SNF/rehab at this time and wishes to go home with home health.  Wife also bedside for the conversation and in agreement and comfortable taking patient home.  Informed patient  that he could change his mind if he wanted rehab at any point.  For now, tentative plan will be for home with home health PT at discharge   Osteoarthritis - continue current pain regimen; caution with oversedation    Prediabetes -A1c 5.8% -Continue diet control   Atrial fibrillation (HCC) -Continue Coreg and  Eliquis   Gout -Uric acid level 4.3. Continue to avoid HCTZ   History of Present Illness  10/16/2020  Pulmonary/ 1st office eval/ Garrett Munoz / [place] [facility]  PMR prednisone 10 mg daily  Chief Complaint  Patient presents with   Follow-up    Patient reports that he had a tube placed  in his throat and had 32 pounds of fluid and has not felt like he can catch his breath, He reports that he is coughing some green-brown sputum at times,   Dyspnea:  [employer] walking limited by energy and cough  Cough: since Nov 2021 esp in am p stirs assoc nasal congestion Sleep: 30 degrees / uses no 02 or cpap/bipap SABA use: seems to help cough better than anything Rec Plan A = Automatic = Always=    Symbicort 80 Take 2 puffs first thing in am and then another 2 puffs about 12 hours later.  Work on inhaler technique:   Plan B = Backup (to supplement plan A, not to replace it) Only use your albuterol inhaler as a rescue medication Also ok to Try albuterol 15 min before an activity (on alternating days)  that you know would make you short of breath     Try prilosec otc '20mg'$   Take 30-60 min before first meal of the day and Pepcid ac (famotidine) 20 mg one after supper until return. GERD diet reviewed, bed blocks rec     PC recs  11/03/20 cough with green sputum x 5 days and worse doe  Double prednisone and bring him in 7/21 with all meds in hand using a trust but verify approach to confirm accurate Medication  Reconciliation The principal here is that until we are certain that the  patients are doing what we've asked, it makes no sense to ask them to do more.  Augmentin 875 mg take one pill twice daily  X 10 days - take at breakfast and supper with large glass of water.  It would help reduce the usual side effects (diarrhea and yeast infections) if you ate cultured yogurt at lunch.    11/06/2020  f/u from Acute Phone care as above ov/[place] [facility]/Roselia Snipe re: worse sob / PMR so chronic steroid dep still at 20 mg  pred and feels worse when uses symbicort due to thorat irritation Chief Complaint  Patient presents with   Follow-up    Patient reports that he is doing better and is not having as many coughing spells.    Dyspnea:  mostly house boud due to heat  Cough: better until flared with green mucus as above, some better on augmenti)  Sleeping: elevated / can't tol cpap (Kelly rx)  SABA use: 1-2 puffs a day - neb 7/16  02: none  Rec Work on inhaler technique:    Ok to try off symbicort and just use the empty container to train on Only use your albuterol as a rescue medication  If needed, ok to change over nebulizer every 4 hours as needed  We will call to arrange ENT Lady Gary)  and allergy Linna Hoff)  Keep previous appt    11/27/2020  f/u ov/[place] [facility]/Calinda Stockinger re:  on pred  10 mg for PMR (pain with 5 mg)  Chief Complaint  Patient presents with   Follow-up    Cough is some better but he states produces some green nasal d/c and sputum after he takes his shower in the evenings. He is having trouble swallowing and is scheduled to see ENT 12/09/20. He is using his albuterol inhaler 2 x per day on average and neb with albuterol 3 x per wk on average.    Dyspnea:  [employer] shopping / 2 x weekly  Cough: almost choking on food x 8 m no better on gerd rx  Sleeping: bed blocks / sleeps on side/ one big pillow  SABA use: hfa and neb feels needs now that off symbicort  02: none  Covid status: x 3      No obvious day to day or daytime variability or assoc excess/ purulent sputum or mucus plugs or hemoptysis or cp or chest tightness, subjective wheeze or overt sinus or hb symptoms.   Sleeping  without nocturnal  or early am exacerbation  of respiratory  c/o's or need for noct saba. Also denies any obvious fluctuation of symptoms with weather or environmental changes or other aggravating or alleviating factors except as outlined above   No unusual exposure hx or h/o childhood pna/ asthma or knowledge  of premature birth.  Current Allergies, Complete Past Medical History, Past Surgical History, Family History, and Social History were reviewed in [employer] record.  ROS  The following are not active complaints unless bolded Hoarseness, sore throat, dysphagia/globus, dental problems, itching, sneezing,  nasal congestion or discharge of excess mucus or purulent secretions, ear ache,   fever, chills, sweats, unintended wt loss or wt gain, classically pleuritic or exertional cp,  orthopnea pnd or arm/hand swelling  or leg swelling, presyncope, palpitations, abdominal pain, anorexia, nausea, vomiting, diarrhea  or change in bowel habits or change in bladder habits, change in stools or change in urine, dysuria, hematuria,  rash, arthralgias, visual complaints, headache, numbness, weakness or ataxia or problems with walking or coordination,  change in mood or  memory.        Current Meds  Medication Sig   albuterol (ACCUNEB) 1.25 MG/3ML nebulizer solution Take 1 ampule by nebulization every 4 (four) hours as needed for wheezing.   albuterol (VENTOLIN HFA) 108 (90 Base) MCG/ACT inhaler Inhale 2 puffs into the lungs every 6 (six) hours as needed for wheezing or shortness of breath.   allopurinol (ZYLOPRIM) 300 MG tablet TAKE 1 TABLET (300 MG TOTAL) BY MOUTH DAILY.   apixaban (ELIQUIS) 5 MG TABS tablet TAKE 1 TABLET (5 MG TOTAL) BY MOUTH 2 (TWO) TIMES DAILY.   carvedilol (COREG) 12.5 MG tablet TAKE 1 & 1/2 TABLETS BY MOUTH EVERY MORNING AND 1 TABLET IN THE EVENING   fluticasone (FLONASE) 50 MCG/ACT nasal spray SHAKE LIQUID AND USE 2 SPRAYS IN EACH NOSTRIL DAILY (Patient taking differently: Place 2 sprays into both nostrils daily as needed for allergies or rhinitis.)   furosemide (LASIX) 20 MG tablet TAKE 1 TABLET (20 MG TOTAL) BY MOUTH DAILY.   gabapentin (NEURONTIN) 300 MG capsule TAKE 2 CAPSULES (600 MG TOTAL) BY MOUTH 3 (THREE) TIMES DAILY.   levocetirizine (XYZAL) 5 MG tablet  Take 1 tablet (5 mg total) by mouth every evening.   losartan (COZAAR) 25 MG tablet TAKE 1 TABLET (25 MG TOTAL) BY MOUTH DAILY.   montelukast (SINGULAIR) 10 MG tablet Take 1 tablet (10 mg total) by mouth at bedtime.  Multiple Vitamins-Minerals (MULTIVITAMIN WITH MINERALS) tablet Take 1 tablet by mouth daily.   predniSONE (DELTASONE) 10 MG tablet Take 1 tablet (10 mg total) by mouth daily with breakfast. (Patient taking differently: Take 20 mg by mouth daily with breakfast. Take 20 mg daily per patient.)   temazepam (RESTORIL) 30 MG capsule Take 1 capsule (30 mg total) by mouth at bedtime as needed for sleep                    Past Medical History:  Diagnosis Date   Atrial fibrillation (HCC)    Bronchitis    hx   Cataract    right eye   CHF NYHA class III, acute on chronic, diastolic (HCC)    COPD (chronic obstructive pulmonary disease) (HCC)    DDD (degenerative disc disease), lumbosacral    Dysrhythmia    irregular heatbeat   Eye worm    right eye   Gout    History of kidney stones    Hypertension    Left knee DJD    Morbid obesity with BMI of 40.0-44.9, adult (HCC)    Pneumonia    hx   Prediabetes    Radicular syndrome of left leg    S/P total knee replacement    right   Sleep apnea    can't use sleep study 10 yrs ago   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        Objective:      11/27/2020          323   11/06/20 (!) 326 lb (147.9 kg)  10/16/20 (!) 326 lb (147.9 kg)  08/28/20 (!) 323 lb 3.2 oz (146.6 kg)      Vital signs reviewed  11/27/2020  - Note at rest 02 sats  911% on RA   General appearance:    amb massively obese  wm nad   HEENT : pt wearing mask not removed for exam due to covid -19 concerns.    NECK :  without JVD/Nodes/TM/ nl carotid upstrokes bilaterally   LUNGS: no acc muscle use,  Nl contour chest which is clear to A and P bilaterally without cough on insp or exp maneuvers   CV:  RRR  no s3 or murmur or increase in P2, and no edema    ABD:  soft and nontender with nl inspiratory excursion in the supine position. No bruits or organomegaly appreciated, bowel sounds nl  MS:  Nl gait/ ext warm without deformities, calf tenderness, cyanosis or clubbing No obvious joint restrictions   SKIN: warm and dry without lesions    NEURO:  alert, approp, nl sensorium with  no motor or cerebellar deficits apparent.                 Assessment

## 2020-11-27 NOTE — Assessment & Plan Note (Signed)
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   These are the same pts (now being labeled as having "irritable larynx syndrome" by some cough centers) who not infrequently have a history of having failed to tolerate ace inhibitors,  dry powder inhalers or biphosphonates or report having atypical/extraesophageal reflux symptoms that don't respond to standard doses of PPI  and are easily confused as having aecopd or asthma flares by even experienced allergists/ pulmonologists (myself included).   Ent/ allergy f/u planned, here prn

## 2020-11-27 NOTE — Assessment & Plan Note (Signed)
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

## 2020-11-27 NOTE — Patient Instructions (Signed)
Keep up your appts with allergy and ENT   Your problem still could be largely related to reflux   GERD (REFLUX)  is an extremely common cause of respiratory symptoms just like yours , many times with no obvious heartburn at all.    It can be treated with medication, but also with lifestyle changes including elevation of the head of your bed (ideally with 6 -8inch blocks under the headboard of your bed),  Smoking cessation, avoidance of late meals, excessive alcohol, and avoid fatty foods, chocolate, peppermint, colas, red wine, and acidic juices such as orange juice.  NO MINT OR MENTHOL PRODUCTS SO NO COUGH DROPS  USE SUGARLESS CANDY INSTEAD (Jolley ranchers or Stover's or Life Savers) or even ice chips will also do - the key is to swallow to prevent all throat clearing. NO OIL BASED VITAMINS - use powdered substitutes.  Avoid fish oil when coughing.    Only use your albuterol as a rescue medication to be used if you can't catch your breath or stop coughing   - The less you use it, the better it will work when you need it. - Ok to use up to 2 puffs  every 4 hours if you must but call for immediate appointment if use goes up over your usual need - Don't leave home without it !!  (think of it like the spare tire for your car)   Pulmonary follow up is as needed

## 2020-12-03 ENCOUNTER — Other Ambulatory Visit (HOSPITAL_COMMUNITY): Payer: Self-pay

## 2020-12-04 ENCOUNTER — Other Ambulatory Visit (HOSPITAL_COMMUNITY): Payer: Self-pay

## 2020-12-04 ENCOUNTER — Other Ambulatory Visit: Payer: Self-pay | Admitting: Family Medicine

## 2020-12-04 ENCOUNTER — Other Ambulatory Visit: Payer: Self-pay | Admitting: Internal Medicine

## 2020-12-04 MED ORDER — FUROSEMIDE 20 MG PO TABS
20.0000 mg | ORAL_TABLET | Freq: Every day | ORAL | 3 refills | Status: DC
  Filled 2020-12-04: qty 30, 30d supply, fill #0
  Filled 2021-01-02: qty 30, 30d supply, fill #1

## 2020-12-09 DIAGNOSIS — I509 Heart failure, unspecified: Secondary | ICD-10-CM | POA: Diagnosis not present

## 2020-12-09 DIAGNOSIS — R1314 Dysphagia, pharyngoesophageal phase: Secondary | ICD-10-CM | POA: Insufficient documentation

## 2020-12-12 ENCOUNTER — Other Ambulatory Visit: Payer: Self-pay | Admitting: Otolaryngology

## 2020-12-12 DIAGNOSIS — R1314 Dysphagia, pharyngoesophageal phase: Secondary | ICD-10-CM

## 2020-12-12 DIAGNOSIS — I509 Heart failure, unspecified: Secondary | ICD-10-CM

## 2020-12-16 ENCOUNTER — Other Ambulatory Visit (HOSPITAL_COMMUNITY): Payer: Self-pay

## 2020-12-16 ENCOUNTER — Telehealth: Payer: Self-pay

## 2020-12-16 MED ORDER — MAGIC MOUTHWASH W/LIDOCAINE: ORAL | 0 refills | Status: AC

## 2020-12-16 NOTE — Telephone Encounter (Signed)
Pt's spouse called about getting a refill of this med clotrimazole (MYCELEX) 10 MG troche. Pt's spouse stated that the pt is experiencing the same issues he had before when this med was prescribed. Pt is out of this med and would like to get a new prescription sent to the pharmacy.  CB: [phone]

## 2020-12-16 NOTE — Telephone Encounter (Signed)
Call placed to patient and wife to inquire.   Reports that patient has been using inhalers and now has mouth irritation and pain.   Reports that there is a slight white coating to tongue as well.   States that he has also been given mouthwash in the past for irritation.   Magic mouthwash called to pharmacy for patient. Also advised to make sure he is rinsing mouth with warm water after using inhalers.

## 2020-12-17 ENCOUNTER — Ambulatory Visit
Admission: RE | Admit: 2020-12-17 | Discharge: 2020-12-17 | Disposition: A | Discharge status: Home / Self Care | Payer: PPO | Source: Ambulatory Visit | Attending: Otolaryngology | Admitting: Otolaryngology

## 2020-12-17 ENCOUNTER — Other Ambulatory Visit (HOSPITAL_COMMUNITY): Payer: Self-pay

## 2020-12-17 DIAGNOSIS — R1314 Dysphagia, pharyngoesophageal phase: Secondary | ICD-10-CM

## 2020-12-17 DIAGNOSIS — I509 Heart failure, unspecified: Secondary | ICD-10-CM

## 2020-12-17 DIAGNOSIS — R1310 Dysphagia, unspecified: Secondary | ICD-10-CM | POA: Diagnosis not present

## 2020-12-17 MED ORDER — NYSTATIN 100000 UNIT/ML MT SUSP
OROMUCOSAL | 0 refills | Status: DC
  Filled 2020-12-17: qty 200, 14d supply, fill #0

## 2020-12-26 ENCOUNTER — Telehealth: Payer: Self-pay

## 2020-12-26 ENCOUNTER — Other Ambulatory Visit: Payer: Self-pay

## 2020-12-26 ENCOUNTER — Ambulatory Visit: Payer: PPO | Admitting: Allergy & Immunology

## 2020-12-26 VITALS — BP 130/78 | HR 76 | Temp 98.3°F | Resp 20 | Ht 71.0 in | Wt 329.0 lb

## 2020-12-26 DIAGNOSIS — J310 Chronic rhinitis: Secondary | ICD-10-CM | POA: Diagnosis not present

## 2020-12-26 DIAGNOSIS — R0602 Shortness of breath: Secondary | ICD-10-CM

## 2020-12-26 MED ORDER — AZELASTINE HCL 0.1 % NA SOLN: 1.0000 | Freq: Every day | NASAL | 12 refills | Status: DC

## 2020-12-26 NOTE — Progress Notes (Signed)
NEW PATIENT  Date of Service/Encounter:  12/26/20  Consult requested by: Garrett Frizzle, MD   Assessment:   SOB (shortness of breath) - followed by Dr. Melvyn Munoz  Chronic rhinitis - getting labs since his breathing was so poorly controlled  Rhinitis medicamentosa  Plan/Recommendations:    1. SOB (shortness of breath) - I would recommend calling Dr. Dennard Munoz about this shortness of breath.  - Perhaps go up to '10mg'$  of the Lasix to see if this helps.  - I would use the albuterol nebulizer 2-3 times daily through the weekend.   2. Chronic rhinitis - We are getting blood work to check for environmental allergies. - We will call you in 1-2 weeks with the results of the testing. - The Benzedrex is very similar to Afrin, which causes terrible rebound congestion. - I would only use this for 3-5 days at a time. - Definitely continue with the fluticasone one spray per nostril twice day.  - Add Astelin one spray per nostril daily (works WITH the fluticasone to help with inflammation in the nose).  3. Return in about 2 months (around 02/25/2021).    This note in its entirety was forwarded to the Provider who requested this consultation.  Subjective:   Garrett Munoz is a 75 y.o. male presenting today for evaluation of  Chief Complaint  Patient presents with   Breathing Problem    Garrett Munoz has a history of the following: Patient Active Problem List   Diagnosis Date Noted   Cough variant asthma vs UACS 10/16/2020   DOE (dyspnea on exertion) 10/16/2020   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 10/16/2020   Polymyalgia rheumatica ([place]) 04/10/2020   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   High risk medication use 04/10/2020   CHF NYHA class III, acute on chronic, diastolic (Garrett Munoz)    Physical deconditioning 03/11/2020   Prediabetes 03/10/2020   Acute CHF (congestive heart failure) ([place]) 03/09/2020   Acute respiratory failure with hypoxia and  hypercapnia (HCC)    Acquired thrombophilia ([place]) 10/19/2019   Irritable bowel syndrome (IBS) 02/27/2019   Bilateral carpal tunnel syndrome 08/28/2018   Eustachian tube dysfunction, left 02/09/2018   Sensorineural hearing loss (SNHL), 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   Tinnitus of both ears 02/09/2018   Spinal stenosis at L4-L5 level 08/18/2017   Spinal stenosis of lumbar region 07/19/2017   Degeneration of lumbosacral intervertebral disc 05/28/2017   Low back pain 05/28/2017   Gout 08/24/2016   S/P total knee replacement 08/24/2016   Cortical age-related cataract of left eye 08/24/2016   Occult blood in stools 11/10/2015   CAP (community acquired pneumonia) 04/10/2015   Fatigue 04/10/2015   Hypertensive disorder 04/10/2015   HCAP (healthcare-associated pneumonia)    Persistent atrial fibrillation ([place])    Anticoagulation adequate 02/21/2015   Obstructive sleep apnea syndrome 02/01/2015   Atrial fibrillation ([place]) 02/01/2015   History of gout 02/01/2015   Morbid obesity ([place]) 02/01/2015   Pain in left knee 02/24/2012   Weakness of left leg 02/24/2012   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  Garrett Munoz was referred by Garrett Frizzle, MD.     Garrett Munoz is a 75 y.o. male presenting for an evaluation of possible environmental allergies .  He was diagnosed with CHF and COPD. There are times when . He had around  32 Lof fluid removed. He is on Lasix to helpt  He has an appointment with Dr. Dennard Munoz next Tuesday. HE has a history of polymyalgia rheumatica. He was on chronic prednisone and is now on '10mg'$  daily. He called Dr. Dennard Munoz last week and he was increased for five days and he is now gback to to 23. His knuckles are not swollen per his wife.  Wife called Dr. Claiborne Munoz with Cardiology and the earliest they could see him was January 2023. He is established with Dr. Claiborne Munoz. He was started on  losartan in November or December. This has been on boards over one year or so.    Asthma/Respiratory Symptom History: Dr. Melvyn Munoz  recommended that he have allergy testing done due to a high IgE of 512. Was on Symbicort and this did not seem to help much at all. He does get relief with albuterol. He does have a nebulizer at home and they typically use the puffer more often than the nebulizer.   Allergic Rhinitis Symptom History: He was on Xyzal and Singulair. He was on those for around 3 months. He coughs a lot but the antihistamines and montelukast did nothing to help with that. When he takes a shower at night, he "blows stuff everywhere" and cleans his nose out. He does use a nose sprays but this is certainly more days than not. Wife thinks that this is perhaps fluticasone. He shows me Benedrex.  Otherwise, there is no history of other atopic diseases, including stinging insect allergies, eczema, urticaria, or contact dermatitis. There is no significant infectious history. Vaccinations are up to date.    Past Medical History: Patient Active Problem List   Diagnosis Date Noted   Cough variant asthma vs UACS 10/16/2020   DOE (dyspnea on exertion) 10/16/2020   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 10/16/2020   Polymyalgia rheumatica ([place]) 04/10/2020   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   High risk medication use 04/10/2020   CHF NYHA class III, acute on chronic, diastolic (HCC)    Physical deconditioning 03/11/2020   Prediabetes 03/10/2020   Acute CHF (congestive heart failure) ([place]) 03/09/2020   Acute respiratory failure with hypoxia and hypercapnia (HCC)    Acquired thrombophilia ([place]) 10/19/2019   Irritable bowel syndrome (IBS) 02/27/2019   Bilateral carpal tunnel syndrome 08/28/2018   Eustachian tube dysfunction, left 02/09/2018   Sensorineural hearing loss (SNHL), 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   Tinnitus of both ears 02/09/2018   Spinal stenosis at L4-L5 level  08/18/2017   Spinal stenosis of lumbar region 07/19/2017   Degeneration of lumbosacral intervertebral disc 05/28/2017   Low back pain 05/28/2017   Gout 08/24/2016   S/P total knee replacement 08/24/2016   Cortical age-related cataract of left eye 08/24/2016   Occult blood in stools 11/10/2015   CAP (community acquired pneumonia) 04/10/2015   Fatigue 04/10/2015   Hypertensive disorder 04/10/2015   HCAP (healthcare-associated pneumonia)    Persistent atrial fibrillation ([place])    Anticoagulation adequate 02/21/2015   Obstructive sleep apnea syndrome 02/01/2015   Atrial fibrillation ([place]) 02/01/2015   History of gout 02/01/2015   Morbid obesity ([place]) 02/01/2015   Pain in left knee 02/24/2012   Weakness of left leg 02/24/2012   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 If you have any questions, ask your nurse or doctor.          albuterol 1.25 MG/3ML nebulizer solution Commonly known as: ACCUNEB Take 1 ampule by nebulization every 4 (four) hours as needed for wheezing.   albuterol 108 (90 Base) MCG/ACT inhaler Commonly known as: VENTOLIN HFA Inhale 2 puffs into the lungs every 6 (six) hours as needed for wheezing or shortness of breath.   allopurinol 300 MG tablet Commonly known as: ZYLOPRIM TAKE 1 TABLET (300 MG TOTAL) BY MOUTH DAILY.   carvedilol 12.5 MG tablet Commonly known as: COREG TAKE 1 & 1/2 TABLETS BY MOUTH EVERY MORNING AND 1 TABLET IN THE EVENING   Eliquis 5 MG Tabs tablet Generic drug: apixaban TAKE 1 TABLET (5 MG TOTAL) BY MOUTH 2 (TWO) TIMES DAILY.   fluticasone 50 MCG/ACT nasal spray Commonly known as: FLONASE SHAKE LIQUID AND USE 2 SPRAYS IN EACH NOSTRIL DAILY What changed: See the new instructions.   furosemide 20 MG tablet Commonly known as: LASIX Take 1 tablet (20 mg  total) by mouth daily.   gabapentin 300 MG capsule Commonly known as: NEURONTIN TAKE 2 CAPSULES (600 MG TOTAL) BY MOUTH 3 (THREE) TIMES DAILY.   levocetirizine 5 MG tablet Commonly known as: XYZAL Take 1 tablet (5 mg total) by mouth every evening.   lidocaine in nystatin suspension Swish & spit with 5 ml's 3 times a day for 5 days then 3 times a day as needed   losartan 25 MG tablet Commonly known as: COZAAR TAKE 1 TABLET (25 MG TOTAL) BY MOUTH DAILY.   magic mouthwash w/lidocaine Soln Swish and spit 80m PO TID x5 days, then PRN- mouth pain. Steroid, Benadryl, Nystatin, Lidocaine 1:1 ratio.   montelukast 10 MG tablet Commonly known as: SINGULAIR Take 1 tablet (10 mg total) by mouth at bedtime.   multivitamin with minerals tablet Take 1 tablet by mouth daily.   predniSONE 10 MG tablet Commonly known as: DELTASONE Take 1 tablet (10 mg total) by mouth daily with breakfast. What changed:  how much to take additional instructions   temazepam 30 MG capsule Commonly known as: RESTORIL Take 1 capsule (30 mg total) by mouth at bedtime as needed for sleep        Birth History: non-contributory  Developmental History: non-contributory  Past Surgical History: Past Surgical History:  Procedure Laterality Date   CARDIOVERSION N/A 03/04/2015   Procedure: CARDIOVERSION;  Surgeon: TTroy Sine MD;  Location: [facility]  Service: Cardiovascular;  Laterality: N/A;   CHOLECYSTECTOMY     COLONOSCOPY N/A 01/01/2016   Procedure: COLONOSCOPY;  Surgeon: NRogene Houston MD;  [facility];  Service: Endoscopy;  Laterality: N/A;  1:45   COLONOSCOPY N/A 01/14/2017   Procedure: COLONOSCOPY;  Surgeon: RRogene Houston MD;  [facility];  Service: Endoscopy;  Laterality: N/A;  12:05   EYE EXAMINATION UNDER ANESTHESIA W/ RETINAL CRYOTHERAPY AND RETINAL LASER  07/2011   EYE SURGERY  02/2011   cat rt   KNEE ARTHROSCOPY     bilateral   LUMBAR  LAMINECTOMY/DECOMPRESSION MICRODISCECTOMY N/A 08/18/2017   Procedure: Bilateral microlumbar decompression Lumbar four-Lumbar five;  Surgeon: BSusa Day MD;  Location: [facility]  Service: Orthopedics;  Laterality: N/A;   POLYPECTOMY  01/01/2016   Procedure: POLYPECTOMY;  Surgeon: NRogene Houston MD;  [facility];  Service: Endoscopy;;  colon   RETINAL DETACHMENT SURGERY  03/2011  rt   TOTAL KNEE ARTHROPLASTY  2003   rt   TOTAL KNEE ARTHROPLASTY  02/07/2012   Procedure: TOTAL KNEE ARTHROPLASTY;  Surgeon: [name], MD;  Location: [facility];  Service: Orthopedics;  Laterality: Left;     Family History: Family History  Problem Relation Age of Onset   Lung cancer Father      Social History: Garrett Munoz lives at home with his wife. He was a smoker years ago and actually worked in a [employer]. HE no longer smokes. There are pets in the home. He is no longer working.     Review of Systems  Constitutional:  Positive for malaise/fatigue. Negative for chills, fever and weight loss.  HENT:  Positive for congestion. Negative for ear discharge and ear pain.   Eyes:  Negative for pain, discharge and redness.  Respiratory:  Positive for cough and sputum production. Negative for shortness of breath and wheezing.   Cardiovascular:  Positive for orthopnea and leg swelling. Negative for chest pain and palpitations.  Gastrointestinal:  Negative for abdominal pain, diarrhea, heartburn, nausea and vomiting.  Skin: Negative.  Negative for itching and rash.  Neurological:  Negative for dizziness and headaches.  Endo/Heme/Allergies:  Negative for environmental allergies. Does not bruise/bleed easily.      Objective:   Blood pressure 130/78, pulse 76, temperature 98.3 F (36.8 C), temperature source Temporal, resp. rate 20, height '5\' 11"'$  (1.803 m), weight (!) 329 lb (149.2 kg), SpO2 90 %. Body mass index is 45.89 kg/m.  **Pulse ox never really got above 90%**   Physical  Exam:   Physical Exam Vitals reviewed.  Constitutional:      Appearance: He is well-developed. He is obese. He is ill-appearing.  HENT:     Head: Normocephalic and atraumatic.     Right Ear: Tympanic membrane, ear canal and external ear normal. No drainage, swelling or tenderness. Tympanic membrane is not injected, scarred, erythematous, retracted or bulging.     Left Ear: Tympanic membrane, ear canal and external ear normal. No drainage, swelling or tenderness. Tympanic membrane is not injected, scarred, erythematous, retracted or bulging.     Nose: No nasal deformity, septal deviation, mucosal edema or rhinorrhea.     Right Turbinates: Enlarged and swollen.     Left Turbinates: Enlarged and swollen.     Right Sinus: No maxillary sinus tenderness or frontal sinus tenderness.     Left Sinus: No maxillary sinus tenderness or frontal sinus tenderness.     Comments: Turbinates are erythematous.     Mouth/Throat:     Mouth: Mucous membranes are not pale and not dry.     Pharynx: Uvula midline.  Eyes:     General:        Right eye: No discharge.        Left eye: No discharge.     Conjunctiva/sclera: Conjunctivae normal.     Right eye: Right conjunctiva is not injected. No chemosis.    Left eye: Left conjunctiva is not injected. No chemosis.    Pupils: Pupils are equal, round, and reactive to light.  Cardiovascular:     Rate and Rhythm: Normal rate and regular rhythm.     Heart sounds: Normal heart sounds.  Pulmonary:     Effort: Pulmonary effort is normal. No tachypnea, accessory muscle usage or respiratory distress.     Breath sounds: Examination of the right-upper field reveals decreased breath sounds. Examination of the left-upper field reveals decreased breath sounds. Examination of the right-middle  field reveals decreased breath sounds. Examination of the left-middle field reveals decreased breath sounds. Examination of the right-lower field reveals decreased breath sounds.  Examination of the left-lower field reveals decreased breath sounds. Decreased breath sounds and wheezing present. No rhonchi or rales.  Chest:     Chest wall: No tenderness.  Lymphadenopathy:     Head:     Right side of head: No submandibular, tonsillar or occipital adenopathy.     Left side of head: No submandibular, tonsillar or occipital adenopathy.     Cervical: No cervical adenopathy.  Skin:    Coloration: Skin is not pale.     Findings: No abrasion, erythema, petechiae or rash. Rash is not papular, urticarial or vesicular.  Neurological:     Mental Status: He is alert.  Psychiatric:        Behavior: Behavior is cooperative.     Diagnostic studies: labs sent instead         Salvatore Marvel, MD [facility]

## 2020-12-26 NOTE — Telephone Encounter (Signed)
Call placed to patient wife.   Reports that he was seen [facility] and noted to have increased SOB, increased WOB and SpO2 85%at rest.   Patient wife reports that patient is very fatigued and breathing very hard.   States that home SpO2 noted in 80's- 89%.   Advised to go to ER for evaluation.

## 2020-12-26 NOTE — Patient Instructions (Addendum)
1. SOB (shortness of breath) - I would recommend calling Dr. Dennard Schaumann about this shortness of breath.  - Perhaps go up to '10mg'$  of the Lasix to see if this helps.  - I would use the albuterol nebulizer 2-3 times daily through the weekend.   2. Chronic rhinitis - We are getting blood work to check for environmental allergies. - We will call you in 1-2 weeks with the results of the testing. - The Benzedrex is very similar to Afrin, which causes terrible rebound congestion. - I would only use this for 3-5 days at a time. - Definitely continue with the fluticasone one spray per nostril twice day.  - Add Astelin one spray per nostril daily (works WITH the fluticasone to help with inflammation in the nose).  3. Return in about 2 months (around 02/25/2021).    Please inform us of any Emergency Department visits, hospitalizations, or changes in symptoms. Call us before going to the ED for breathing or allergy symptoms since we might be able to fit you in for a sick visit. Feel free to contact us anytime with any questions, problems, or concerns.  It was a pleasure to meet you and your wife today!  Websites that have reliable patient information: 1. American Academy of Asthma, Allergy, and Immunology: www.aaaai.org 2. Food Allergy Research and Education (FARE): foodallergy.org 3. Mothers of Asthmatics: http://www.asthmacommunitynetwork.org 4. American College of Allergy, Asthma, and Immunology: www.acaai.org   COVID-19 Vaccine Information can be found at: ShippingScam.co.uk For questions related to vaccine distribution or appointments, please email [email or web address] or call [phone].   We realize that you might be concerned about having an allergic reaction to the COVID19 vaccines. To help with that concern, WE ARE OFFERING THE COVID19 VACCINES IN OUR OFFICE! Ask the front desk for dates!     "Like" [place] on Facebook and Instagram  for our latest updates!      A healthy democracy works best when [employer] participate! Make sure you are registered to vote! If you have moved or changed any of your contact information, you will need to get this updated before voting!  In some cases, you MAY be able to register to vote online: [email or web address]

## 2020-12-26 NOTE — Telephone Encounter (Signed)
Pt's spouse called in stating the pt's oxygen level was in the mid 59's all day. Pt's spouse also stated that at an appt with dermatologist, pt's oxygen was checked and was showing 85%. Pt was instructed to call pcp to see what they may need to do. Please advise.  Cb#: [phone]

## 2020-12-28 ENCOUNTER — Encounter: Payer: Self-pay | Admitting: Allergy & Immunology

## 2020-12-29 ENCOUNTER — Inpatient Hospital Stay (HOSPITAL_COMMUNITY): Payer: PPO

## 2020-12-29 ENCOUNTER — Emergency Department (HOSPITAL_COMMUNITY): Payer: PPO

## 2020-12-29 ENCOUNTER — Other Ambulatory Visit: Payer: Self-pay

## 2020-12-29 ENCOUNTER — Inpatient Hospital Stay (HOSPITAL_COMMUNITY)
Admission: EM | Admit: 2020-12-29 | Discharge: 2021-01-08 | DRG: 871 | Disposition: A | Discharge status: Home Health | Payer: PPO | Attending: Family Medicine | Admitting: Family Medicine

## 2020-12-29 DIAGNOSIS — G9341 Metabolic encephalopathy: Secondary | ICD-10-CM | POA: Diagnosis present

## 2020-12-29 DIAGNOSIS — A419 Sepsis, unspecified organism: Principal | ICD-10-CM | POA: Diagnosis present

## 2020-12-29 DIAGNOSIS — I4821 Permanent atrial fibrillation: Secondary | ICD-10-CM | POA: Diagnosis present

## 2020-12-29 DIAGNOSIS — G4733 Obstructive sleep apnea (adult) (pediatric): Secondary | ICD-10-CM | POA: Diagnosis not present

## 2020-12-29 DIAGNOSIS — R7303 Prediabetes: Secondary | ICD-10-CM | POA: Diagnosis not present

## 2020-12-29 DIAGNOSIS — E872 Acidosis: Secondary | ICD-10-CM | POA: Diagnosis present

## 2020-12-29 DIAGNOSIS — I4819 Other persistent atrial fibrillation: Secondary | ICD-10-CM | POA: Diagnosis not present

## 2020-12-29 DIAGNOSIS — Z79899 Other long term (current) drug therapy: Secondary | ICD-10-CM

## 2020-12-29 DIAGNOSIS — J690 Pneumonitis due to inhalation of food and vomit: Secondary | ICD-10-CM | POA: Diagnosis present

## 2020-12-29 DIAGNOSIS — Z452 Encounter for adjustment and management of vascular access device: Secondary | ICD-10-CM

## 2020-12-29 DIAGNOSIS — J9601 Acute respiratory failure with hypoxia: Secondary | ICD-10-CM | POA: Diagnosis not present

## 2020-12-29 DIAGNOSIS — K580 Irritable bowel syndrome with diarrhea: Secondary | ICD-10-CM | POA: Diagnosis present

## 2020-12-29 DIAGNOSIS — R6521 Severe sepsis with septic shock: Secondary | ICD-10-CM | POA: Diagnosis not present

## 2020-12-29 DIAGNOSIS — J449 Chronic obstructive pulmonary disease, unspecified: Secondary | ICD-10-CM | POA: Diagnosis present

## 2020-12-29 DIAGNOSIS — M109 Gout, unspecified: Secondary | ICD-10-CM | POA: Diagnosis present

## 2020-12-29 DIAGNOSIS — Z6841 Body Mass Index (BMI) 40.0 and over, adult: Secondary | ICD-10-CM

## 2020-12-29 DIAGNOSIS — N179 Acute kidney failure, unspecified: Secondary | ICD-10-CM | POA: Diagnosis present

## 2020-12-29 DIAGNOSIS — M353 Polymyalgia rheumatica: Secondary | ICD-10-CM | POA: Diagnosis not present

## 2020-12-29 DIAGNOSIS — Z7901 Long term (current) use of anticoagulants: Secondary | ICD-10-CM

## 2020-12-29 DIAGNOSIS — J189 Pneumonia, unspecified organism: Secondary | ICD-10-CM | POA: Diagnosis not present

## 2020-12-29 DIAGNOSIS — I4891 Unspecified atrial fibrillation: Secondary | ICD-10-CM | POA: Diagnosis not present

## 2020-12-29 DIAGNOSIS — Z9289 Personal history of other medical treatment: Secondary | ICD-10-CM

## 2020-12-29 DIAGNOSIS — R5381 Other malaise: Secondary | ICD-10-CM | POA: Diagnosis present

## 2020-12-29 DIAGNOSIS — R6520 Severe sepsis without septic shock: Secondary | ICD-10-CM | POA: Diagnosis not present

## 2020-12-29 DIAGNOSIS — E876 Hypokalemia: Secondary | ICD-10-CM | POA: Diagnosis not present

## 2020-12-29 DIAGNOSIS — J9690 Respiratory failure, unspecified, unspecified whether with hypoxia or hypercapnia: Secondary | ICD-10-CM | POA: Diagnosis present

## 2020-12-29 DIAGNOSIS — Z87891 Personal history of nicotine dependence: Secondary | ICD-10-CM

## 2020-12-29 DIAGNOSIS — R627 Adult failure to thrive: Secondary | ICD-10-CM | POA: Diagnosis present

## 2020-12-29 DIAGNOSIS — J9602 Acute respiratory failure with hypercapnia: Secondary | ICD-10-CM | POA: Diagnosis present

## 2020-12-29 DIAGNOSIS — I509 Heart failure, unspecified: Secondary | ICD-10-CM

## 2020-12-29 DIAGNOSIS — I110 Hypertensive heart disease with heart failure: Secondary | ICD-10-CM | POA: Diagnosis present

## 2020-12-29 DIAGNOSIS — D6959 Other secondary thrombocytopenia: Secondary | ICD-10-CM | POA: Diagnosis not present

## 2020-12-29 DIAGNOSIS — R0602 Shortness of breath: Secondary | ICD-10-CM | POA: Diagnosis not present

## 2020-12-29 DIAGNOSIS — Z20822 Contact with and (suspected) exposure to covid-19: Secondary | ICD-10-CM | POA: Diagnosis present

## 2020-12-29 DIAGNOSIS — E785 Hyperlipidemia, unspecified: Secondary | ICD-10-CM | POA: Diagnosis present

## 2020-12-29 DIAGNOSIS — Z96653 Presence of artificial knee joint, bilateral: Secondary | ICD-10-CM | POA: Diagnosis present

## 2020-12-29 DIAGNOSIS — Z4682 Encounter for fitting and adjustment of non-vascular catheter: Secondary | ICD-10-CM | POA: Diagnosis not present

## 2020-12-29 DIAGNOSIS — I5033 Acute on chronic diastolic (congestive) heart failure: Secondary | ICD-10-CM | POA: Diagnosis present

## 2020-12-29 DIAGNOSIS — R1314 Dysphagia, pharyngoesophageal phase: Secondary | ICD-10-CM | POA: Diagnosis not present

## 2020-12-29 DIAGNOSIS — I517 Cardiomegaly: Secondary | ICD-10-CM | POA: Diagnosis not present

## 2020-12-29 DIAGNOSIS — Z7952 Long term (current) use of systemic steroids: Secondary | ICD-10-CM

## 2020-12-29 DIAGNOSIS — E875 Hyperkalemia: Secondary | ICD-10-CM | POA: Diagnosis present

## 2020-12-29 LAB — URINALYSIS, ROUTINE W REFLEX MICROSCOPIC
Bilirubin Urine: NEGATIVE
Glucose, UA: NEGATIVE mg/dL
Hgb urine dipstick: NEGATIVE
Ketones, ur: NEGATIVE mg/dL
Nitrite: NEGATIVE
Protein, ur: NEGATIVE mg/dL
Specific Gravity, Urine: 1.01 (ref 1.005–1.030)
pH: 6 (ref 5.0–8.0)

## 2020-12-29 LAB — I-STAT ARTERIAL BLOOD GAS, ED
Acid-Base Excess: 5 mmol/L — ABNORMAL HIGH (ref 0.0–2.0)
Bicarbonate: 32 mmol/L — ABNORMAL HIGH (ref 20.0–28.0)
Calcium, Ion: 1.08 mmol/L — ABNORMAL LOW (ref 1.15–1.40)
HCT: 51 % (ref 39.0–52.0)
Hemoglobin: 17.3 g/dL — ABNORMAL HIGH (ref 13.0–17.0)
O2 Saturation: 99 %
Patient temperature: 99.2
Potassium: 4.3 mmol/L (ref 3.5–5.1)
Sodium: 133 mmol/L — ABNORMAL LOW (ref 135–145)
TCO2: 34 mmol/L — ABNORMAL HIGH (ref 22–32)
pCO2 arterial: 54.7 mmHg — ABNORMAL HIGH (ref 32.0–48.0)
pH, Arterial: 7.376 (ref 7.350–7.450)
pO2, Arterial: 123 mmHg — ABNORMAL HIGH (ref 83.0–108.0)

## 2020-12-29 LAB — CBC WITH DIFFERENTIAL/PLATELET
Abs Immature Granulocytes: 0.03 10*3/uL (ref 0.00–0.07)
Basophils Absolute: 0.1 10*3/uL (ref 0.0–0.1)
Basophils Relative: 0 %
Eosinophils Absolute: 0.2 10*3/uL (ref 0.0–0.5)
Eosinophils Relative: 1 %
HCT: 58.8 % — ABNORMAL HIGH (ref 39.0–52.0)
Hemoglobin: 19 g/dL — ABNORMAL HIGH (ref 13.0–17.0)
Immature Granulocytes: 0 %
Lymphocytes Relative: 7 %
Lymphs Abs: 1 10*3/uL (ref 0.7–4.0)
MCH: 32.6 pg (ref 26.0–34.0)
MCHC: 32.3 g/dL (ref 30.0–36.0)
MCV: 100.9 fL — ABNORMAL HIGH (ref 80.0–100.0)
Monocytes Absolute: 0.9 10*3/uL (ref 0.1–1.0)
Monocytes Relative: 6 %
Neutro Abs: 11.7 10*3/uL — ABNORMAL HIGH (ref 1.7–7.7)
Neutrophils Relative %: 86 %
Platelets: 143 10*3/uL — ABNORMAL LOW (ref 150–400)
RBC: 5.83 MIL/uL — ABNORMAL HIGH (ref 4.22–5.81)
RDW: 14 % (ref 11.5–15.5)
WBC: 13.9 10*3/uL — ABNORMAL HIGH (ref 4.0–10.5)
nRBC: 0 % (ref 0.0–0.2)

## 2020-12-29 LAB — CBG MONITORING, ED: Glucose-Capillary: 118 mg/dL — ABNORMAL HIGH (ref 70–99)

## 2020-12-29 LAB — COMPREHENSIVE METABOLIC PANEL
ALT: 29 U/L (ref 0–44)
AST: 20 U/L (ref 15–41)
Albumin: 3.8 g/dL (ref 3.5–5.0)
Alkaline Phosphatase: 60 U/L (ref 38–126)
Anion gap: 8 (ref 5–15)
BUN: 12 mg/dL (ref 8–23)
CO2: 36 mmol/L — ABNORMAL HIGH (ref 22–32)
Calcium: 9.3 mg/dL (ref 8.9–10.3)
Chloride: 96 mmol/L — ABNORMAL LOW (ref 98–111)
Creatinine, Ser: 1.06 mg/dL (ref 0.61–1.24)
GFR, Estimated: >60 mL/min (ref >60)
Glucose, Bld: 88 mg/dL (ref 70–99)
Potassium: 4.4 mmol/L (ref 3.5–5.1)
Sodium: 140 mmol/L (ref 135–145)
Total Bilirubin: 0.9 mg/dL (ref 0.3–1.2)
Total Protein: 7.2 g/dL (ref 6.5–8.1)

## 2020-12-29 LAB — URINALYSIS, MICROSCOPIC (REFLEX)
Bacteria, UA: NONE SEEN
Squamous Epithelial / HPF: NONE SEEN (ref 0–5)

## 2020-12-29 LAB — BLOOD GAS, ARTERIAL
Acid-Base Excess: 5.6 mmol/L — ABNORMAL HIGH (ref 0.0–2.0)
Bicarbonate: 34.9 mmol/L — ABNORMAL HIGH (ref 20.0–28.0)
FIO2: 70
O2 Saturation: 93.8 %
Patient temperature: 37.3
pCO2 arterial: 119 mmHg (ref 32.0–48.0)
pH, Arterial: 7.097 — CL (ref 7.350–7.450)
pO2, Arterial: 91.5 mmHg (ref 83.0–108.0)

## 2020-12-29 LAB — RESP PANEL BY RT-PCR (FLU A&B, COVID) ARPGX2
Influenza A by PCR: NEGATIVE
Influenza B by PCR: NEGATIVE
SARS Coronavirus 2 by RT PCR: NEGATIVE

## 2020-12-29 LAB — TROPONIN I (HIGH SENSITIVITY)
Troponin I (High Sensitivity): 14 ng/L (ref <18)
Troponin I (High Sensitivity): 14 ng/L (ref <18)

## 2020-12-29 LAB — MAGNESIUM: Magnesium: 1.8 mg/dL (ref 1.7–2.4)

## 2020-12-29 LAB — BRAIN NATRIURETIC PEPTIDE: B Natriuretic Peptide: 125.9 pg/mL — ABNORMAL HIGH (ref 0.0–100.0)

## 2020-12-29 LAB — HIV ANTIBODY (ROUTINE TESTING W REFLEX): HIV Screen 4th Generation wRfx: NONREACTIVE

## 2020-12-29 LAB — PROTIME-INR
INR: 1 (ref 0.8–1.2)
Prothrombin Time: 13.4 seconds (ref 11.4–15.2)

## 2020-12-29 LAB — STREP PNEUMONIAE URINARY ANTIGEN: Strep Pneumo Urinary Antigen: NEGATIVE

## 2020-12-29 LAB — APTT: aPTT: 24 seconds (ref 24–36)

## 2020-12-29 LAB — LACTIC ACID, PLASMA
Lactic Acid, Venous: 1.7 mmol/L (ref 0.5–1.9)
Lactic Acid, Venous: 1.3 mmol/L (ref 0.5–1.9)

## 2020-12-29 LAB — PROCALCITONIN: Procalcitonin: <0.1 ng/mL

## 2020-12-29 LAB — GLUCOSE, CAPILLARY: Glucose-Capillary: 202 mg/dL — ABNORMAL HIGH (ref 70–99)

## 2020-12-29 LAB — MRSA NEXT GEN BY PCR, NASAL: MRSA by PCR Next Gen: NOT DETECTED

## 2020-12-29 MED ORDER — DOCUSATE SODIUM 50 MG/5ML PO LIQD: 100.0000 mg | Freq: Two times a day (BID) | ORAL | Status: DC | PRN

## 2020-12-29 MED ORDER — ALBUTEROL (5 MG/ML) CONTINUOUS INHALATION SOLN
10.0000 mg/h | INHALATION_SOLUTION | RESPIRATORY_TRACT | Status: AC
  Filled 2020-12-29 (×5): qty 0.5

## 2020-12-29 MED ORDER — NOREPINEPHRINE 4 MG/250ML-% IV SOLN
0.0000 ug/min | INTRAVENOUS | Status: DC
Start: 2020-12-29 — End: 2021-01-01
  Administered 2020-12-29: 13 ug/min via INTRAVENOUS
  Administered 2020-12-29: 20 ug/min via INTRAVENOUS
  Administered 2020-12-30: 7 ug/min via INTRAVENOUS
  Administered 2020-12-30: 13 ug/min via INTRAVENOUS
  Administered 2020-12-30: 7 ug/min via INTRAVENOUS
  Filled 2020-12-29 (×4): qty 250

## 2020-12-29 MED ORDER — NOREPINEPHRINE 4 MG/250ML-% IV SOLN
INTRAVENOUS | Status: AC
  Filled 2020-12-29: qty 250

## 2020-12-29 MED ORDER — FENTANYL CITRATE (PF) 100 MCG/2ML IJ SOLN
25.0000 ug | INTRAMUSCULAR | Status: DC | PRN
  Administered 2020-12-30 (×2): 50 ug via INTRAVENOUS
  Filled 2020-12-29 (×2): qty 2

## 2020-12-29 MED ORDER — PANTOPRAZOLE SODIUM 40 MG IV SOLR
40.0000 mg | INTRAVENOUS | Status: DC
  Administered 2020-12-29 – 2020-12-31 (×3): 40 mg via INTRAVENOUS
  Filled 2020-12-29 (×3): qty 40

## 2020-12-29 MED ORDER — PROPOFOL 1000 MG/100ML IV EMUL
0.0000 ug/kg/min | INTRAVENOUS | Status: DC
  Administered 2020-12-29 (×2): 30 ug/kg/min via INTRAVENOUS
  Administered 2020-12-29: 25 ug/kg/min via INTRAVENOUS
  Administered 2020-12-30: 35 ug/kg/min via INTRAVENOUS
  Administered 2020-12-30: 30 ug/kg/min via INTRAVENOUS
  Administered 2020-12-30: 35 ug/kg/min via INTRAVENOUS
  Administered 2020-12-30 (×3): 25 ug/kg/min via INTRAVENOUS
  Administered 2020-12-30: 30 ug/kg/min via INTRAVENOUS
  Administered 2020-12-31 (×2): 35 ug/kg/min via INTRAVENOUS
  Filled 2020-12-29 (×12): qty 100

## 2020-12-29 MED ORDER — MIDAZOLAM HCL 2 MG/2ML IJ SOLN
INTRAMUSCULAR | Status: AC
  Filled 2020-12-29: qty 2

## 2020-12-29 MED ORDER — HYDROCORTISONE SOD SUC (PF) 100 MG IJ SOLR
100.0000 mg | Freq: Three times a day (TID) | INTRAMUSCULAR | Status: DC
  Administered 2020-12-29 – 2020-12-30 (×2): 100 mg via INTRAVENOUS
  Filled 2020-12-29 (×2): qty 2

## 2020-12-29 MED ORDER — LACTATED RINGERS IV SOLN
INTRAVENOUS | Status: DC | PRN
  Administered 2020-12-29: 999 mL/h via INTRAVENOUS

## 2020-12-29 MED ORDER — GUAIFENESIN ER 600 MG PO TB12
600.0000 mg | ORAL_TABLET | Freq: Two times a day (BID) | ORAL | Status: DC
  Administered 2020-12-29: 600 mg via ORAL
  Filled 2020-12-29: qty 1

## 2020-12-29 MED ORDER — HEPARIN (PORCINE) 25000 UT/250ML-% IV SOLN
1700.0000 [IU]/h | INTRAVENOUS | Status: DC
  Administered 2020-12-29: 1600 [IU]/h via INTRAVENOUS
  Administered 2020-12-30: 1800 [IU]/h via INTRAVENOUS
  Administered 2020-12-31 (×2): 1700 [IU]/h via INTRAVENOUS
  Filled 2020-12-29 (×4): qty 250

## 2020-12-29 MED ORDER — MIDAZOLAM HCL 5 MG/5ML IJ SOLN
INTRAMUSCULAR | Status: DC | PRN
  Administered 2020-12-29: 2 mg via INTRAVENOUS

## 2020-12-29 MED ORDER — FENTANYL CITRATE (PF) 100 MCG/2ML IJ SOLN: 25.0000 ug | INTRAMUSCULAR | Status: DC

## 2020-12-29 MED ORDER — LORAZEPAM 2 MG/ML IJ SOLN
0.5000 mg | Freq: Once | INTRAMUSCULAR | Status: AC
  Administered 2020-12-29: 0.5 mg via INTRAVENOUS
  Filled 2020-12-29: qty 1

## 2020-12-29 MED ORDER — POLYETHYLENE GLYCOL 3350 17 G PO PACK: 17.0000 g | PACK | Freq: Every day | ORAL | Status: DC | PRN

## 2020-12-29 MED ORDER — POLYETHYLENE GLYCOL 3350 17 G PO PACK
17.0000 g | PACK | Freq: Every day | ORAL | Status: DC
Start: 2020-12-29 — End: 2020-12-31
  Administered 2020-12-30: 17 g
  Filled 2020-12-29: qty 1

## 2020-12-29 MED ORDER — SODIUM CHLORIDE 0.9 % IV SOLN
2.0000 g | INTRAVENOUS | Status: DC
  Administered 2020-12-29: 2 g via INTRAVENOUS
  Filled 2020-12-29: qty 20

## 2020-12-29 MED ORDER — LACTATED RINGERS IV SOLN: INTRAVENOUS | Status: DC

## 2020-12-29 MED ORDER — FENTANYL CITRATE PF 50 MCG/ML IJ SOSY: 25.0000 ug | PREFILLED_SYRINGE | INTRAMUSCULAR | Status: DC | PRN

## 2020-12-29 MED ORDER — SODIUM CHLORIDE 0.9 % IV SOLN: 2.0000 g | Freq: Three times a day (TID) | INTRAVENOUS | Status: DC

## 2020-12-29 MED ORDER — MIDAZOLAM HCL 2 MG/2ML IJ SOLN: 2.0000 mg | Freq: Once | INTRAMUSCULAR | Status: DC

## 2020-12-29 MED ORDER — ALBUTEROL SULFATE (2.5 MG/3ML) 0.083% IN NEBU
10.0000 mg | INHALATION_SOLUTION | RESPIRATORY_TRACT | Status: DC
  Administered 2020-12-29: 10 mg via RESPIRATORY_TRACT
  Filled 2020-12-29: qty 12

## 2020-12-29 MED ORDER — FENTANYL CITRATE (PF) 100 MCG/2ML IJ SOLN
INTRAMUSCULAR | Status: DC | PRN
  Administered 2020-12-29: 50 ug via INTRAVENOUS

## 2020-12-29 MED ORDER — NOREPINEPHRINE 4 MG/250ML-% IV SOLN
INTRAVENOUS | Status: AC
  Administered 2020-12-29: 10 ug/min
  Filled 2020-12-29: qty 250

## 2020-12-29 MED ORDER — FUROSEMIDE 10 MG/ML IJ SOLN
20.0000 mg | Freq: Once | INTRAMUSCULAR | Status: AC
  Administered 2020-12-29: 20 mg via INTRAVENOUS
  Filled 2020-12-29: qty 2

## 2020-12-29 MED ORDER — NOREPINEPHRINE 4 MG/250ML-% IV SOLN: 0.0000 ug/min | INTRAVENOUS | Status: DC

## 2020-12-29 MED ORDER — ETOMIDATE 2 MG/ML IV SOLN
INTRAVENOUS | Status: DC | PRN
  Administered 2020-12-29: 20 mg via INTRAVENOUS

## 2020-12-29 MED ORDER — SODIUM CHLORIDE 0.9 % IV SOLN
500.0000 mg | INTRAVENOUS | Status: DC
  Administered 2020-12-29 – 2021-01-01 (×4): 500 mg via INTRAVENOUS
  Filled 2020-12-29 (×4): qty 500

## 2020-12-29 MED ORDER — SODIUM CHLORIDE 0.9 % IV BOLUS
2300.0000 mL | Freq: Once | INTRAVENOUS | Status: AC
  Administered 2020-12-29: 2300 mL via INTRAVENOUS

## 2020-12-29 MED ORDER — ETOMIDATE 2 MG/ML IV SOLN: 20.0000 mg | Freq: Once | INTRAVENOUS | Status: DC

## 2020-12-29 MED ORDER — FENTANYL CITRATE (PF) 100 MCG/2ML IJ SOLN
50.0000 ug | Freq: Once | INTRAMUSCULAR | Status: AC
Start: 2020-12-29 — End: 2020-12-29
  Administered 2020-12-29: 50 ug via INTRAVENOUS
  Filled 2020-12-29: qty 2

## 2020-12-29 MED ORDER — INSULIN ASPART 100 UNIT/ML IJ SOLN
0.0000 [IU] | INTRAMUSCULAR | Status: DC
  Administered 2020-12-29: 5 [IU] via SUBCUTANEOUS
  Administered 2020-12-30: 3 [IU] via SUBCUTANEOUS
  Administered 2020-12-30 (×5): 2 [IU] via SUBCUTANEOUS
  Administered 2020-12-31: 3 [IU] via SUBCUTANEOUS
  Administered 2020-12-31: 2 [IU] via SUBCUTANEOUS

## 2020-12-29 MED ORDER — FENTANYL CITRATE PF 50 MCG/ML IJ SOSY: 50.0000 ug | PREFILLED_SYRINGE | Freq: Once | INTRAMUSCULAR | Status: DC

## 2020-12-29 MED ORDER — DOCUSATE SODIUM 100 MG PO CAPS: 100.0000 mg | ORAL_CAPSULE | Freq: Two times a day (BID) | ORAL | Status: DC | PRN

## 2020-12-29 MED ORDER — ETOMIDATE 2 MG/ML IV SOLN
INTRAVENOUS | Status: AC
  Filled 2020-12-29: qty 10

## 2020-12-29 MED ORDER — LORAZEPAM 2 MG/ML IJ SOLN
0.5000 mg | Freq: Four times a day (QID) | INTRAMUSCULAR | Status: DC | PRN
  Filled 2020-12-29: qty 1

## 2020-12-29 MED ORDER — FENTANYL CITRATE PF 50 MCG/ML IJ SOSY
PREFILLED_SYRINGE | INTRAMUSCULAR | Status: AC
  Filled 2020-12-29: qty 1

## 2020-12-29 MED ORDER — ACETAMINOPHEN 325 MG PO TABS
325.0000 mg | ORAL_TABLET | Freq: Once | ORAL | Status: AC
  Administered 2020-12-29: 325 mg via ORAL
  Filled 2020-12-29: qty 1

## 2020-12-29 MED ORDER — CHLORHEXIDINE GLUCONATE CLOTH 2 % EX PADS
6.0000 | MEDICATED_PAD | Freq: Every day | CUTANEOUS | Status: DC
  Administered 2020-12-31 – 2021-01-07 (×7): 6 via TOPICAL

## 2020-12-29 MED ORDER — ALBUTEROL (5 MG/ML) CONTINUOUS INHALATION SOLN: 10.0000 mg/h | INHALATION_SOLUTION | RESPIRATORY_TRACT | Status: DC

## 2020-12-29 MED ORDER — NOREPINEPHRINE BITARTRATE 1 MG/ML IV SOLN
INTRAVENOUS | Status: DC | PRN
  Administered 2020-12-29: 5 ug via INTRAVENOUS

## 2020-12-29 MED ORDER — CEFTRIAXONE SODIUM 2 G IJ SOLR
2.0000 g | INTRAMUSCULAR | Status: DC
  Administered 2020-12-30 – 2020-12-31 (×2): 2 g via INTRAVENOUS
  Filled 2020-12-29 (×3): qty 20

## 2020-12-29 MED ORDER — DOCUSATE SODIUM 50 MG/5ML PO LIQD
100.0000 mg | Freq: Two times a day (BID) | ORAL | Status: DC
  Administered 2020-12-29 – 2020-12-30 (×3): 100 mg
  Filled 2020-12-29 (×4): qty 10

## 2020-12-29 NOTE — Progress Notes (Signed)
Elink following for code sepsis 

## 2020-12-29 NOTE — Progress Notes (Signed)
[facility] Progress Note Patient Name: Garrett Munoz DOB: 22-Jan-1946 MRN: [id number]   Date of Service  12/29/2020  HPI/Events of Note    eICU Interventions  Scheduled Fentanyl order discontinued.        Frederik Pear 12/29/2020, 8:41 PM

## 2020-12-29 NOTE — Progress Notes (Signed)
ANTICOAGULATION CONSULT NOTE - Initial Consult  Pharmacy Consult for heparin Indication: atrial fibrillation  No Known Allergies  Patient Measurements: Height: '5\' 10"'$  (177.8 cm) Weight: (!) 145.8 kg (321 lb 6.4 oz) IBW/kg (Calculated) : 73 Heparin Dosing Weight: 109 kg   Vital Signs: Temp: 99.2 F (37.3 C) (09/12 0952) Temp Source: Oral (09/12 0757) BP: 119/85 (09/12 1700) Pulse Rate: 71 (09/12 1700)  Labs: Recent Labs    12/29/20 0839 12/29/20 1045  HGB 19.0*  --   HCT 58.8*  --   PLT 143*  --   APTT  --  24  LABPROT  --  13.4  INR  --  1.0  CREATININE 1.06  --   TROPONINIHS 14 14    Estimated Creatinine Clearance: 85.6 mL/min (by C-G formula based on SCr of 1.06 mg/dL).   Medical History: Past Medical History:  Diagnosis Date   Atrial fibrillation ([place])    Bronchitis    hx   Cataract    right eye   CHF NYHA class III, acute on chronic, diastolic (HCC)    COPD (chronic obstructive pulmonary disease) (HCC)    DDD (degenerative disc disease), lumbosacral    Dysrhythmia    irregular heatbeat   Eye worm    right eye   Gout    History of kidney stones    Hypertension    Left knee DJD    Morbid obesity with BMI of 40.0-44.9, adult (HCC)    Pneumonia    hx   Prediabetes    Radicular syndrome of left leg    S/P total knee replacement    right   Sleep apnea    can't use sleep study 10 yrs ago   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     Medications:  (Not in a hospital admission)   Assessment: 43 YOM with hypoxia requiring mechanical intubation. Pharmacy consulted to start IV heparin for Afib. On Eliquis PTA and last dose was yesterday morning. H/H elevated, Plt low. Scr wnl  Goal of Therapy:  Heparin level 0.3-0.7 units/ml aPTT 66-102 seconds Monitor platelets by anticoagulation protocol: Yes   Plan:  -Start heparin drip at 1600 units/hr  -F/u 6 hr HL/aPTT  -Monitor daily HL, CBC and s/s of bleeding   Albertina Parr, PharmD., BCPS,  BCCCP Clinical Pharmacist Please refer to [facility] for unit-specific pharmacist

## 2020-12-29 NOTE — Progress Notes (Signed)
Notified bedside nurse of need to draw blood cultures.  

## 2020-12-29 NOTE — ED Triage Notes (Signed)
Pt arrived complaining of shortness of breath. Pt states that he has been sick since November.  Pt states he has had a lot of pneumonia and is on prednisone  Pt is speaking in short sentences. Denis pain at this time   Pt sats 72% on arrival placed on 6L in triage sats up to 90%

## 2020-12-29 NOTE — Progress Notes (Signed)
Pharmacy Antibiotic Note  POLLUX EYE is a 75 y.o. male admitted on 12/29/2020 with sepsis.  Pharmacy has been consulted for Cefepime dosing. WBC mildly elevated, SCr wnl   Plan: Start Cefepime 2 gm IV q 8 hours Continue azithromycin per MD Monitor CBC, renal fx, cultures and clinical progress  Height: '5\' 11"'$  (180.3 cm) Weight: (!) 145.8 kg (321 lb 6.4 oz) IBW/kg (Calculated) : 75.3  Temp (24hrs), Avg:99.9 F (37.7 C), Min:99.2 F (37.3 C), Max:100.6 F (38.1 C)  Recent Labs  Lab 12/29/20 0839 12/29/20 1041  WBC 13.9*  --   CREATININE 1.06  --   LATICACIDVEN 1.7 1.3    Estimated Creatinine Clearance: 86.8 mL/min (by C-G formula based on SCr of 1.06 mg/dL).    No Known Allergies  Antimicrobials this admission: Cefepime 9/12 >>  Azithromycin 9/12 >>   Dose adjustments this admission:   Microbiology results: 9/12 BCx >>  9/12 UCx >>   Thank you for allowing pharmacy to be a part of this patient's care.  Albertina Parr, PharmD., BCPS, BCCCP Clinical Pharmacist Please refer to [facility] for unit-specific pharmacist

## 2020-12-29 NOTE — ED Notes (Signed)
Notified provider of critical ABG results: pH 7.097 and pCO2 119

## 2020-12-29 NOTE — Progress Notes (Signed)
Using Ideal body weight for fluid requirements

## 2020-12-29 NOTE — Procedures (Signed)
Central Venous Catheter Insertion Procedure Note  Garrett Munoz  [id number]  [birth year]  Date:12/29/20  Time:4:54 PM   Provider Performing:Saskia Simerson D. Harris   Procedure: Insertion of Non-tunneled Central Venous [id number]) with US guidance [id number])    Indication(s) Medication administration  Consent Risks of the procedure as well as the alternatives and risks of each were explained to the patient and/or caregiver.  Consent for the procedure was obtained and is signed in the bedside chart  Anesthesia Topical only with 1% lidocaine   Timeout Verified patient identification, verified procedure, site/side was marked, verified correct patient position, special equipment/implants available, medications/allergies/relevant history reviewed, required imaging and test results available.  Sterile Technique Maximal sterile technique including full sterile barrier drape, hand hygiene, sterile gown, sterile gloves, mask, hair covering, sterile ultrasound probe cover (if used).  Procedure Description Area of catheter insertion was cleaned with chlorhexidine and draped in sterile fashion.  With real-time ultrasound guidance a central venous catheter was placed into the right internal jugular vein. Nonpulsatile blood flow and easy flushing noted in all ports.  The catheter was sutured in place and sterile dressing applied.  Complications/Tolerance None; patient tolerated the procedure well. Chest X-ray is ordered to verify placement for internal jugular or subclavian cannulation.   Chest x-ray is not ordered for femoral cannulation.  EBL Minimal  Specimen(s) None  Garrett Munoz D. Kenton Kingfisher, NP-C [facility] Personal contact information can be found on Amion  12/29/2020, 4:54 PM

## 2020-12-29 NOTE — ED Provider Notes (Signed)
Medical screening examination/treatment/procedure(s) were conducted as a shared visit with non-physician practitioner(s) and myself.  I personally evaluated the patient during the encounter.  Clinical Impression:   Final diagnoses:  Community acquired pneumonia of right lower lobe of lung  Acute respiratory failure with hypoxia (HCC)  Sepsis, due to unspecified organism, unspecified whether acute organ dysfunction present [facility])    This patient is an ill-appearing 75 year old male presenting hypoxic tachycardic tachypneic and febrile.  He has bilateral pulmonary rales, he has expiratory wheezing, he has pitting edema of his bilateral lower extremities which is 1+ and symmetrical.  He has good pulses, he speaks in shortened sentences, his temperature is 100.6 with a pulse of 110.  EKG shows atrial fibrillation, increased rate, QRS is narrow.  Concern for sepsis, would also consider congestive heart failure given the patient's history of severe left ventricular hypertrophy, now with likely infection on top of this he may have acute on chronic congestive heart failure secondary to a respiratory infection.  Tylenol for fever, blood cultures, antibiotics for pulmonary source, the patient is on a chronic dose of prednisone for his polymyalgia rheumatica likely giving him some immunosuppression.  The patient has obvious pneumonia on the x-ray, there is a progressive hypoxia, he is continually tachycardic, the fever has defervesced, his oxygen has responded to supplemental oxygen and he has been given antibiotics.  He has had 2 mean arterial pressures which have been less than 65 although he has a normal lactic acid thus he will be treated for possible septic shock with 30 cc/kg of ideal body weight fluids, his ideal body weight fluid calculation is approximately 2263 mL.  I have ordered these fluids, hospitalist was consulted for admission   EKG Interpretation  Date/Time:  Monday December 29 2020 08:02:21  EDT Ventricular Rate:  102 PR Interval:    QRS Duration: 96 QT Interval:  314 QTC Calculation: 409 R Axis:   -52 Text Interpretation: Atrial fibrillation with rapid ventricular response Incomplete right bundle branch block Left anterior fascicular block Cannot rule out Inferior infarct , age undetermined Abnormal ECG Confirmed by Noemi Chapel [phone]) on 12/29/2020 8:05:26 AM       .Critical Care Performed by: Noemi Chapel, MD Authorized by: Noemi Chapel, MD   Critical care provider statement:    Critical care time (minutes):  35   Critical care time was exclusive of:  Separately billable procedures and treating other patients and teaching time   Critical care was necessary to treat or prevent imminent or life-threatening deterioration of the following conditions:  Sepsis and respiratory failure   Critical care was time spent personally by me on the following activities:  Blood draw for specimens, development of treatment plan with patient or surrogate, discussions with consultants, evaluation of patient's response to treatment, examination of patient, obtaining history from patient or surrogate, ordering and performing treatments and interventions, ordering and review of laboratory studies, ordering and review of radiographic studies, pulse oximetry, re-evaluation of patient's condition and review of old charts    Noemi Chapel, MD 01/02/21 [phone]

## 2020-12-29 NOTE — Progress Notes (Signed)
Notified provider and bedside nurse of need to order and administer fluid bolus, pt needs 4476 cc.

## 2020-12-29 NOTE — Consult Note (Signed)
NAME:  DENNISE DUFFIN, MRN:  [id number], DOB:  1945-11-11, LOS: 0 ADMISSION DATE:  12/29/2020, CONSULTATION DATE:  12/29/2020 REFERRING MD:  Fuller Plan , CHIEF COMPLAINT:  acute hypercarbic respiratory failure   History of Present Illness:  75 yo male with known COPD, pAFib, diastolic heart failure admitted with acute hypercarbic respiratory failure secondary to pneumonia.  Patient follows with Dr [name] for pulmonary outpatient for upper airway cough syndrome and dyspnea on exertion since Nov 2021. Patient presented to ED today for complaints of shortness of breath.  Vital signs on presentation: Temp 100.6, HR 109, RR 22, SpO2 88% on 6 L [place].  He was increased to 10 L [place] with improvement in oxygenation.  Imaging with new right lower lobe consolidation. Patient was started on antibiotics with Azithromycin and Rocephin.  Patient was later found to be unresponsive and ABG revealed hypercarbic respiratory failure with ABG: 7/119/91/34. He required intubation and mechanical ventilation. Work up for infection is in progress. Antibiotics have been broadened to Cefepime.   At time of eval patient remains somnolent saturating in low 90s on BIPAP, wife at bedside, states he has not felt well for past 10 months.  Pertinent  Medical History  Afib COPD Hypertension Pre-diabetes OSA CHF, NYHA class III  Significant Hospital Events: Including procedures, antibiotic start and stop dates in addition to other pertinent events   9/12 Admit to [facility], intubation, central line  Interim History / Subjective:  Consulted.  Objective   Blood pressure (!) 94/52, pulse (!) 108, temperature 99.2 F (37.3 C), resp. rate (!) 22, height '5\' 11"'$  (1.803 m), weight (!) 145.8 kg, SpO2 92 %.        Intake/Output Summary (Last 24 hours) at 12/29/2020 1524 Last data filed at 12/29/2020 1238 Gross per 24 hour  Intake 2650 ml  Output --  Net 2650 ml   Filed Weights   12/29/20 1152  Weight: (!) 145.8 kg     Examination: General: ill appearing obese man on BIPAP HENT: BIPAP in place with poor seal Lungs: severely diminished bilaterally, +accessory muscle use Cardiovascular: irregular, ext warm Abdomen: protuberant, hypoactive BS, tympanic to percussion Extremities: trace edema Neuro: withdraws x 4 Skin: no rashes  ABG severe acute on chronic hypercarbia Mild AKI BNP baseline Pct neg WBC mildly up CXR multifocal R sided infiltrates with question of volume loss vs. rotation  Resolved Hospital Problem list     Assessment & Plan:  Acute hypercarbic respiratory failure secondary to community acquired pneumonia with underlying grade 3 obesity.  Not protecting airway, suspicion for aspiration making BIPAP not ideal - Intubate, bronch, VAP prevention bundle, prop/fent titrated to RASS -1 - CTX, azithromycin - OGT to LIS, suspicion for ileus, f/u KUB  HFpEF Shock- in context of CAP, acidemia Chronic steroid use - Nonurgent repeat echo - Stress steroids   Hypertension -Hold home coreg, losartan this evening  Afib -Home systemic a/c with Eliquis and rate controlled with coreg -Heparin drip for systemic a/c   Best Practice (right click and "Reselect all SmartList Selections" daily)   Diet/type: NPO DVT prophylaxis: systemic heparin GI prophylaxis: H2B Lines: N/A Foley:  N/A Code Status:  full code Last date of multidisciplinary goals of care discussion [updated wife at bedside, full scope treatment at present, has stated he would not want prolonged life support]  Labs   CBC: Recent Labs  Lab 12/29/20 0839  WBC 13.9*  NEUTROABS 11.7*  HGB 19.0*  HCT 58.8*  MCV 100.9*  PLT  143*    Basic Metabolic Panel: Recent Labs  Lab 12/29/20 0839  NA 140  K 4.4  CL 96*  CO2 36*  GLUCOSE 88  BUN 12  CREATININE 1.06  CALCIUM 9.3  MG 1.8   GFR: Estimated Creatinine Clearance: 86.8 mL/min (by C-G formula based on SCr of 1.06 mg/dL). Recent Labs  Lab 12/29/20 0839  12/29/20 1041 12/29/20 1252  PROCALCITON  --   --  <0.10  WBC 13.9*  --   --   LATICACIDVEN 1.7 1.3  --     Liver Function Tests: Recent Labs  Lab 12/29/20 0839  AST 20  ALT 29  ALKPHOS 60  BILITOT 0.9  PROT 7.2  ALBUMIN 3.8   No results for input(s): LIPASE, AMYLASE in the last 168 hours. No results for input(s): AMMONIA in the last 168 hours.  ABG    Component Value Date/Time   PHART 7.322 (L) 03/10/2020 0429   PCO2ART 70.8 (HH) 03/10/2020 0429   PO2ART 68.9 (L) 03/10/2020 0429   HCO3 35.9 (H) 03/10/2020 0429   TCO2 40 (H) 03/09/2020 1618   O2SAT 93.5 03/10/2020 0429     Coagulation Profile: Recent Labs  Lab 12/29/20 1045  INR 1.0    Cardiac Enzymes: No results for input(s): CKTOTAL, CKMB, CKMBINDEX, TROPONINI in the last 168 hours.  HbA1C: Hgb A1c MFr Bld  Date/Time Value Ref Range Status  03/10/2020 04:26 AM 5.8 (H) 4.8 - 5.6 % Final    Comment:    (NOTE) Pre diabetes:          5.7%-6.4%  Diabetes:              >6.4%  Glycemic control for   <7.0% adults with diabetes   07/23/2016 08:05 AM 5.4 <5.7 % Final    Comment:      For the purpose of screening for the presence of diabetes:   <5.7%       Consistent with the absence of diabetes 5.7-6.4 %   Consistent with increased risk for diabetes (prediabetes) >=6.5 %     Consistent with diabetes   This assay result is consistent with a decreased risk of diabetes.   Currently, no consensus exists regarding use of hemoglobin A1c for diagnosis of diabetes in children.   According to American Diabetes Association (ADA) guidelines, hemoglobin A1c <7.0% represents optimal control in non-pregnant diabetic patients. Different metrics may apply to specific patient populations. Standards of Medical Care in Diabetes (ADA).       CBG: No results for input(s): GLUCAP in the last 168 hours.  Review of Systems:   Cannot assess, comatose  Past Medical History:  He,  has a past medical history of Atrial  fibrillation ([place]), Bronchitis, Cataract, CHF NYHA class III, acute on chronic, diastolic (HCC), COPD (chronic obstructive pulmonary disease) ([place]), DDD (degenerative disc disease), lumbosacral, Dysrhythmia, Eye worm, Gout, History of kidney stones, Hypertension, Left knee DJD, Morbid obesity with BMI of 40.0-44.9, adult ([place]), Pneumonia, Prediabetes, Radicular syndrome of left leg, S/P total knee replacement, Sleep apnea, and 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.   Surgical History:   Past Surgical History:  Procedure Laterality Date   CARDIOVERSION N/A 03/04/2015   Procedure: CARDIOVERSION;  Surgeon: [name], MD;  Location: [place];  Service: Cardiovascular;  Laterality: N/A;   CHOLECYSTECTOMY     COLONOSCOPY N/A 01/01/2016   Procedure: COLONOSCOPY;  Surgeon: [name], MD;  [facility];  Service: Endoscopy;  Laterality: N/A;  1:45  COLONOSCOPY N/A 01/14/2017   Procedure: COLONOSCOPY;  Surgeon: [name], MD;  [facility];  Service: Endoscopy;  Laterality: N/A;  12:05   EYE EXAMINATION UNDER ANESTHESIA W/ RETINAL CRYOTHERAPY AND RETINAL LASER  07/2011   EYE SURGERY  02/2011   cat rt   KNEE ARTHROSCOPY     bilateral   LUMBAR LAMINECTOMY/DECOMPRESSION MICRODISCECTOMY N/A 08/18/2017   Procedure: Bilateral microlumbar decompression Lumbar four-Lumbar five;  Surgeon: [name], MD;  Location: [place];  Service: Orthopedics;  Laterality: N/A;   POLYPECTOMY  01/01/2016   Procedure: POLYPECTOMY;  Surgeon: [name], MD;  [facility];  Service: Endoscopy;;  colon   RETINAL DETACHMENT SURGERY  03/2011   rt   TOTAL KNEE ARTHROPLASTY  2003   rt   TOTAL KNEE ARTHROPLASTY  02/07/2012   Procedure: TOTAL KNEE ARTHROPLASTY;  Surgeon: [name], MD;  Location: [place];  Service: Orthopedics;  Laterality: Left;     Social History:   reports that he quit smoking about 28 years ago. His smoking use included cigarettes. He has a 30.00 pack-year  smoking history. He has never used smokeless tobacco. He reports current alcohol use. He reports that he does not use drugs.   Family History:  His family history includes Lung cancer in his father.   Allergies No Known Allergies   Home Medications  Prior to Admission medications   Medication Sig Start Date End Date Taking? Authorizing Provider  albuterol (ACCUNEB) 1.25 MG/3ML nebulizer solution Take 1 ampule by nebulization every 4 (four) hours as needed for wheezing.   Yes [provider]  albuterol (VENTOLIN HFA) 108 (90 Base) MCG/ACT inhaler Inhale 2 puffs into the lungs every 6 (six) hours as needed for wheezing or shortness of breath. 07/29/20  Yes [name], MD  apixaban (ELIQUIS) 5 MG TABS tablet TAKE 1 TABLET (5 MG TOTAL) BY MOUTH 2 (TWO) TIMES DAILY. Patient taking differently: Take 5 mg by mouth 2 (two) times daily. 07/01/20 07/01/21 Yes [name], MD  azelastine (ASTELIN) 0.1 % nasal spray Place 1 spray into both nostrils daily. Use in each nostril as directed Patient taking differently: Place 1 spray into both nostrils daily as needed for rhinitis. 12/26/20  Yes Valentina Shaggy, MD  carvedilol (COREG) 12.5 MG tablet TAKE 1 & 1/2 TABLETS BY MOUTH EVERY MORNING AND 1 TABLET IN THE EVENING Patient taking differently: Take 6.25-12.5 mg by mouth See admin instructions. Take half tablet by mouth in the morning and then take 1 whole tablet by mouth in the evening per wife 04/03/20 04/03/21 Yes [name], MD  fluticasone (FLONASE) 50 MCG/ACT nasal spray SHAKE LIQUID AND USE 2 SPRAYS IN EACH NOSTRIL DAILY Patient taking differently: Place 2 sprays into both nostrils daily as needed for allergies or rhinitis. 04/23/19  Yes [name], MD  furosemide (LASIX) 20 MG tablet Take 1 tablet (20 mg total) by mouth daily. 12/04/20  Yes [name], MD  gabapentin (NEURONTIN) 300 MG capsule TAKE 2 CAPSULES (600 MG TOTAL) BY MOUTH 3 (THREE) TIMES DAILY. Patient  taking differently: Take 600 mg by mouth 3 (three) times daily. 04/25/20 04/25/21 Yes PickardCammie Mcgee, MD  losartan (COZAAR) 25 MG tablet TAKE 1 TABLET (25 MG TOTAL) BY MOUTH DAILY. Patient taking differently: Take 25 mg by mouth daily. 06/26/20 06/26/21 Yes PickardCammie Mcgee, MD  montelukast (SINGULAIR) 10 MG tablet Take 1 tablet (10 mg total) by mouth at bedtime. 09/29/20  Yes [name], MD  Multiple Vitamins-Minerals (MULTIVITAMIN WITH MINERALS) tablet Take 1 tablet by mouth daily. 03/12/20 03/12/21 Yes Dwyane Dee, MD  predniSONE (DELTASONE) 10 MG tablet Take 1 tablet (10 mg total) by mouth daily with breakfast. 09/29/20  Yes Rice, Resa Miner, MD  temazepam (RESTORIL) 30 MG capsule Take 1 capsule (30 mg total) by mouth at bedtime as needed for sleep Patient taking differently: Take 30 mg by mouth at bedtime. 09/30/20  Yes [name], MD  allopurinol (ZYLOPRIM) 300 MG tablet TAKE 1 TABLET (300 MG TOTAL) BY MOUTH DAILY. Patient not taking: Reported on 12/29/2020 12/31/19 12/30/20  [name], MD  levocetirizine (XYZAL) 5 MG tablet Take 1 tablet (5 mg total) by mouth every evening. Patient not taking: Reported on 12/29/2020 09/29/20   [name], MD  lidocaine in nystatin suspension Swish & spit with 5 ml's 3 times a day for 5 days then 3 times a day as needed Patient not taking: Reported on 12/29/2020 12/16/20   [name], MD  magic mouthwash w/lidocaine SOLN Swish and spit 77m PO TID x5 days, then PRN- mouth pain. Steroid, Benadryl, Nystatin, Lidocaine 1:1 ratio. Patient not taking: Reported on 12/29/2020 12/16/20   PSusy Frizzle MD  potassium chloride SA (KLOR-CON) 20 MEQ tablet Take 1 tablet (20 mEq total) by mouth daily. Patient not taking: No sig reported 03/26/20 05/26/20  PSusy Frizzle MD     Critical care time: 41 minutes not including any separately billable procedures

## 2020-12-29 NOTE — H&P (Addendum)
History and Physical    [name] [id number] DOB: [birth year] DOA: 12/29/2020  Referring MD/NP/PA: Lavonna Rua, PA-C PCP: [name], MD  Consultants: Chesley Mires, MD-pulmonology Patient coming from: Home  Chief Complaint: Difficulty breathing  I have personally briefly reviewed patient's old medical records in [place]   HPI: Garrett Munoz is a 75 y.o. male with medical history significant of hypertension, hyperlipidemia, chronic atrial fibrillation on Eliquis, COPD with chronic bronchitis, diastolic CHF, PMR on chronic steroids, presents with complaints of difficulty breathing.  History is obtained from the patient's wife as he is currently altered.  Apparently over the last couple weeks patient has been feeling bad.  He had been advised by his rheumatologist to increase prednisone to 60 mg daily for 1 week which he completed last Wednesday.  His wife reported that he had been putting on extra fluid and noticed that his feet have been swelling.  He recently had a allergy appointment 3 days ago where his oxygenation had dropped to 90s on room air and he was advised to follow-up with his primary care provider.  The patient chronically has a intermittently productive cough that the wife reports was unchanged.  However, this morning she was awoken by the patient and noted that his breathing was labored and his oxygen had dropped down into the 70s when she checked in.  She reports that he was disoriented and brought him to the hospital for further evaluation.  He had last been hospitalized for some similar back in November of last year, but ultimately had been discharged home without oxygen.  He is supposed to wear CPAP at night, but had not been able to tolerate it.  His wife also makes note that the patient chronically gets choked up with food and liquids.  He had also been evaluated by ENT in regards to this but they found no signs of an obstruction.  ED Course: Upon  admission into the emergency department patient was noted to be febrile up to 100.6 F, pulse 88-1 28, respirations 17-30, blood pressure 82/47-158/71, and O2 saturations noted to be as low as 86% with improvement on 6 L of nasal cannula oxygen.  Labs significant for WBC 13.9, hemoglobin 19.6, platelets 143, BNP 125.9, high-sensitivity troponin 14, and lactic acid 1.7.  Urinalysis did not note any signs of infection.  Chest x-ray significant for patchy including opacity in the right mid and lower lung suggestive of Multi lobar pneumonia.  Sepsis protocol has been initiated without fluid bolus initially.  Patient had been given acetaminophen, Rocephin, azithromycin, and Lasix 20 mg IV x1 dose.  Fluid bolus of 2.3 L was initiated after patient noted to be hypotensive.  Review of Systems  Unable to perform ROS: Mental status change   Past Medical History:  Diagnosis Date   Atrial fibrillation ([place])    Bronchitis    hx   Cataract    right eye   CHF NYHA class III, acute on chronic, diastolic (HCC)    COPD (chronic obstructive pulmonary disease) (HCC)    DDD (degenerative disc disease), lumbosacral    Dysrhythmia    irregular heatbeat   Eye worm    right eye   Gout    History of kidney stones    Hypertension    Left knee DJD    Morbid obesity with BMI of 40.0-44.9, adult (HCC)    Pneumonia    hx   Prediabetes    Radicular syndrome of left leg  S/P total knee replacement    right   Sleep apnea    can't use sleep study 10 yrs ago   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     Past Surgical History:  Procedure Laterality Date   CARDIOVERSION N/A 03/04/2015   Procedure: CARDIOVERSION;  Surgeon: [name], MD;  Location: [facility];  Service: Cardiovascular;  Laterality: N/A;   CHOLECYSTECTOMY     COLONOSCOPY N/A 01/01/2016   Procedure: COLONOSCOPY;  Surgeon: [name], MD;  [facility];  Service: Endoscopy;  Laterality: N/A;  1:45   COLONOSCOPY N/A 01/14/2017    Procedure: COLONOSCOPY;  Surgeon: [name], MD;  [facility];  Service: Endoscopy;  Laterality: N/A;  12:05   EYE EXAMINATION UNDER ANESTHESIA W/ RETINAL CRYOTHERAPY AND RETINAL LASER  07/2011   EYE SURGERY  02/2011   cat rt   KNEE ARTHROSCOPY     bilateral   LUMBAR LAMINECTOMY/DECOMPRESSION MICRODISCECTOMY N/A 08/18/2017   Procedure: Bilateral microlumbar decompression Lumbar four-Lumbar five;  Surgeon: [name], MD;  Location: [facility];  Service: Orthopedics;  Laterality: N/A;   POLYPECTOMY  01/01/2016   Procedure: POLYPECTOMY;  Surgeon: [name], MD;  [facility];  Service: Endoscopy;;  colon   RETINAL DETACHMENT SURGERY  03/2011   rt   TOTAL KNEE ARTHROPLASTY  2003   rt   TOTAL KNEE ARTHROPLASTY  02/07/2012   Procedure: TOTAL KNEE ARTHROPLASTY;  Surgeon: [name], MD;  Location: [facility];  Service: Orthopedics;  Laterality: Left;     reports that he quit smoking about 28 years ago. His smoking use included cigarettes. He has a 30.00 pack-year smoking history. He has never used smokeless tobacco. He reports current alcohol use. He reports that he does not use drugs.  No Known Allergies  Family History  Problem Relation Age of Onset   Lung cancer Father     Prior to Admission medications   Medication Sig Start Date End Date Taking? Authorizing Provider  albuterol (ACCUNEB) 1.25 MG/3ML nebulizer solution Take 1 ampule by nebulization every 4 (four) hours as needed for wheezing.    [provider]  albuterol (VENTOLIN HFA) 108 (90 Base) MCG/ACT inhaler Inhale 2 puffs into the lungs every 6 (six) hours as needed for wheezing or shortness of breath. 07/29/20   [name], MD  allopurinol (ZYLOPRIM) 300 MG tablet TAKE 1 TABLET (300 MG TOTAL) BY MOUTH DAILY. 12/31/19 12/30/20  [name], MD  apixaban (ELIQUIS) 5 MG TABS tablet TAKE 1 TABLET (5 MG TOTAL) BY MOUTH 2 (TWO) TIMES DAILY. 07/01/20 07/01/21  [name], MD   azelastine (ASTELIN) 0.1 % nasal spray Place 1 spray into both nostrils daily. Use in each nostril as directed 12/26/20   Valentina Shaggy, MD  carvedilol (COREG) 12.5 MG tablet TAKE 1 & 1/2 TABLETS BY MOUTH EVERY MORNING AND 1 TABLET IN THE EVENING 04/03/20 04/03/21  [name], MD  fluticasone (FLONASE) 50 MCG/ACT nasal spray SHAKE LIQUID AND USE 2 SPRAYS IN EACH NOSTRIL DAILY Patient taking differently: Place 2 sprays into both nostrils daily as needed for allergies or rhinitis. 04/23/19   [name], MD  furosemide (LASIX) 20 MG tablet Take 1 tablet (20 mg total) by mouth daily. 12/04/20   [name], MD  gabapentin (NEURONTIN) 300 MG capsule TAKE 2 CAPSULES (600 MG TOTAL) BY MOUTH 3 (THREE) TIMES DAILY. 04/25/20 04/25/21  [name], MD  levocetirizine Harlow Ohms)  5 MG tablet Take 1 tablet (5 mg total) by mouth every evening. 09/29/20   [name], MD  lidocaine in nystatin suspension Swish & spit with 5 ml's 3 times a day for 5 days then 3 times a day as needed 12/16/20   [name], MD  losartan (COZAAR) 25 MG tablet TAKE 1 TABLET (25 MG TOTAL) BY MOUTH DAILY. 06/26/20 06/26/21  [name], MD  magic mouthwash w/lidocaine SOLN Swish and spit 8m PO TID x5 days, then PRN- mouth pain. Steroid, Benadryl, Nystatin, Lidocaine 1:1 ratio. 12/16/20   PSusy Frizzle MD  montelukast (SINGULAIR) 10 MG tablet Take 1 tablet (10 mg total) by mouth at bedtime. 09/29/20   PSusy Frizzle MD  Multiple Vitamins-Minerals (MULTIVITAMIN WITH MINERALS) tablet Take 1 tablet by mouth daily. 03/12/20 03/12/21  GDwyane Dee MD  predniSONE (DELTASONE) 10 MG tablet Take 1 tablet (10 mg total) by mouth daily with breakfast. Patient taking differently: Take 20 mg by mouth daily with breakfast. Take 20 mg daily per patient. 09/29/20   Rice, CResa Miner MD  temazepam (RESTORIL) 30 MG capsule Take 1 capsule (30 mg total) by mouth at bedtime as needed for sleep 09/30/20   PSusy Frizzle MD  potassium chloride SA (KLOR-CON) 20 MEQ tablet Take 1 tablet (20 mEq total) by mouth daily. Patient not taking: No sig reported 03/26/20 05/26/20  PSusy Frizzle MD    Physical Exam:  Constitutional: Obese elderly male currently in respiratory distress Vitals:   12/29/20 1115 12/29/20 1130 12/29/20 1145 12/29/20 1152  BP: (!) 93/49 104/78 (!) 132/114   Pulse: (!) 111 (!) 108 (!) 128 88  Resp: (!) '22 19 18 '$ (!) 25  Temp:      TempSrc:      SpO2: 96% 95% 90% 90%  Weight:    (!) 145.8 kg  Height:    '5\' 11"'$  (1.803 m)   Eyes: PERRL, lids and conjunctivae normal ENMT: Mucous membranes are moist. Posterior pharynx clear of any exudate or lesions.   Neck: normal, supple, no masses, no thyromegaly Respiratory: Tachypneic with coarse breath sounds currently on Ventimask with O2 saturations 90%. Cardiovascular: Irregularly irregular, no murmurs / rubs / gallops.  At least +1 pitting bilateral lower extremity edema. 2+ pedal pulses. No carotid bruits.  Abdomen: Protuberant abdomen no tenderness, no masses palpated. No hepatosplenomegaly. Bowel sounds positive.  Musculoskeletal: no clubbing / cyanosis. No joint deformity upper and lower extremities. Good ROM, no contractures. Normal muscle tone.  Skin: no rashes, lesions, ulcers. No induration Neurologic: Unable to assess at this time Psychiatric: Lethargic, unable to arouse at this time    Labs on Admission: I have personally reviewed following labs and imaging studies  CBC: Recent Labs  Lab 12/29/20 0839  WBC 13.9*  NEUTROABS 11.7*  HGB 19.0*  HCT 58.8*  MCV 100.9*  PLT [id number]   Basic Metabolic Panel: Recent Labs  Lab 12/29/20 0839  NA 140  K 4.4  CL 96*  CO2 36*  GLUCOSE 88  BUN 12  CREATININE 1.06  CALCIUM 9.3  MG 1.8   GFR: Estimated Creatinine Clearance: 86.8 mL/min (by C-G formula based on SCr of 1.06 mg/dL). Liver Function Tests: Recent Labs  Lab 12/29/20 0839  AST 20  ALT 29  ALKPHOS 60   BILITOT 0.9  PROT 7.2  ALBUMIN 3.8   No results for input(s): LIPASE, AMYLASE in the last 168 hours. No results for input(s): AMMONIA in the last 168  hours. Coagulation Profile: No results for input(s): INR, PROTIME in the last 168 hours. Cardiac Enzymes: No results for input(s): CKTOTAL, CKMB, CKMBINDEX, TROPONINI in the last 168 hours. BNP (last 3 results) No results for input(s): PROBNP in the last 8760 hours. HbA1C: No results for input(s): HGBA1C in the last 72 hours. CBG: No results for input(s): GLUCAP in the last 168 hours. Lipid Profile: No results for input(s): CHOL, HDL, LDLCALC, TRIG, CHOLHDL, LDLDIRECT in the last 72 hours. Thyroid Function Tests: No results for input(s): TSH, T4TOTAL, FREET4, T3FREE, THYROIDAB in the last 72 hours. Anemia Panel: No results for input(s): VITAMINB12, FOLATE, FERRITIN, TIBC, IRON, RETICCTPCT in the last 72 hours. Urine analysis:    Component Value Date/Time   COLORURINE DARK YELLOW 06/26/2020 0852   APPEARANCEUR CLEAR 06/26/2020 0852   LABSPEC 1.020 06/26/2020 0852   PHURINE 6.5 06/26/2020 0852   GLUCOSEU NEGATIVE 06/26/2020 0852   HGBUR NEGATIVE 06/26/2020 0852   BILIRUBINUR NEGATIVE 03/09/2020 1634   KETONESUR NEGATIVE 06/26/2020 0852   PROTEINUR NEGATIVE 06/26/2020 0852   UROBILINOGEN 0.2 02/02/2012 1120   NITRITE NEGATIVE 06/26/2020 0852   LEUKOCYTESUR NEGATIVE 06/26/2020 0852   Sepsis Labs: Recent Results (from the past 240 hour(s))  Resp Panel by RT-PCR (Flu A&B, Covid) Nasopharyngeal Swab     Status: None   Collection Time: 12/29/20  9:11 AM   Specimen: Nasopharyngeal Swab; Nasopharyngeal(NP) swabs in vial transport medium  Result Value Ref Range Status   SARS Coronavirus 2 by RT PCR NEGATIVE NEGATIVE Final    Comment: (NOTE) SARS-CoV-2 target nucleic acids are NOT DETECTED.  The SARS-CoV-2 RNA is generally detectable in upper respiratory specimens during the acute phase of infection. The lowest concentration of  SARS-CoV-2 viral copies this assay can detect is 138 copies/mL. A negative result does not preclude SARS-Cov-2 infection and should not be used as the sole basis for treatment or other patient management decisions. A negative result may occur with  improper specimen collection/handling, submission of specimen other than nasopharyngeal swab, presence of viral mutation(s) within the areas targeted by this assay, and inadequate number of viral copies(<138 copies/mL). A negative result must be combined with clinical observations, patient history, and epidemiological information. The expected result is Negative.  Fact Sheet for Patients:  EntrepreneurPulse.com.au  Fact Sheet for Healthcare Providers:  IncredibleEmployment.be  This test is no t yet approved or cleared by the [place] FDA and  has been authorized for detection and/or diagnosis of SARS-CoV-2 by FDA under an Emergency Use Authorization (EUA). This EUA will remain  in effect (meaning this test can be used) for the duration of the COVID-19 declaration under Section 564(b)(1) of the Act, 21 U.S.C.section 360bbb-3(b)(1), unless the authorization is terminated  or revoked sooner.       Influenza A by PCR NEGATIVE NEGATIVE Final   Influenza B by PCR NEGATIVE NEGATIVE Final    Comment: (NOTE) The Xpert Xpress SARS-CoV-2/FLU/RSV plus assay is intended as an aid in the diagnosis of influenza from Nasopharyngeal swab specimens and should not be used as a sole basis for treatment. Nasal washings and aspirates are unacceptable for Xpert Xpress SARS-CoV-2/FLU/RSV testing.  Fact Sheet for Patients: EntrepreneurPulse.com.au  Fact Sheet for Healthcare Providers: IncredibleEmployment.be  This test is not yet approved or cleared by the [place] FDA and has been authorized for detection and/or diagnosis of SARS-CoV-2 by FDA under an Emergency Use  Authorization (EUA). This EUA will remain in effect (meaning this test can be used) for the duration of  the COVID-19 declaration under Section 564(b)(1) of the Act, 21 U.S.C. section 360bbb-3(b)(1), unless the authorization is terminated or revoked.  Performed at [facility], [place] [address], [place], [place] [address]      Radiological Exams on Admission: DG Chest Portable 1 View  Result Date: 12/29/2020 CLINICAL DATA:  75 year old male with shortness of breath this morning. Former smoker. EXAM: PORTABLE CHEST 1 VIEW COMPARISON:  Chest radiographs 10/16/2020 and earlier. FINDINGS: Portable AP semi upright view at 0855 hours. Patient is slightly rotated to the right. There is Patchy and confluent opacity throughout the right mid and lower lung. The diaphragm remains visible. Stable cardiac size and mediastinal contours. Mild underlying cardiomegaly. No pneumothorax. Left lung appears stable. Paucity of bowel gas in the upper abdomen. No acute osseous abnormality identified. IMPRESSION: Patchy and confluent opacity in the right mid and lower lung is nonspecific but suggestive of multilobar pneumonia. If there are signs/symptoms of infection then post treatment PA and lateral chest X-ray is recommended in 3-4 weeks to ensure resolution and exclude underlying malignancy. If not recommend Chest CT (IV contrast preferred) to further characterize. Electronically Signed   By: Genevie Ann M.D.   On: 12/29/2020 09:09    EKG: Independently reviewed.  Atrial fibrillation with RVR at 102 bpm  Assessment/Plan Acute respiratory failure with hypoxia and hypercapnia  Sepsis secondary to pneumonia: Patient presented with complaints of worsening shortness of breath.  Found to be febrile up to 100.6 F with tachycardia and tachypnea and white blood cell count elevated at 13.9 meeting SIRS criteria.  Lactate acid was reassuring at 1.7.  Chest x-ray concerning for right mid and lower lobe opacities which pneumonia  was suspected.  O2 saturations noted to drop into the 80s despite being on Venturi mask wife makes note that the patient often gets choked up when eating and drinking.  Suspect possibility of aspiration as cause.  Patient had been given Rocephin and azithromycin. -Admit to a telemetry bed -Aspiration precautions -Continuous pulse oximetry with nasal cannula oxygen to maintain O2 saturation greater than 92%.  BiPAP started before ABG revealed pH 7.097, PCO2 119 -Check procalcitonin -Follow-up blood cultures -Continue empiric antibiotics of Rocephin and azithromycin -Patient needs to have formal speech eval once off BiPAP -Recheck CBC tomorrow morning -PCCM consulted due to symptoms, wall monitor for further evaluation  Transient hypotension: Blood pressures are initially noted to be as low as 82/47 on admission.  Pressures seem to improve after being bolused IV fluids.  However, question if this is possibly related to patient being on chronic steroids. -Hydrocortisone 100 mg every 8 hours -Hold antihypertensive medication  Acute metabolic encephalopathy: Patient was reported to be acutely altered this morning.  Symptoms secondary to hypercapnia. -Neurochecks  Chronic atrial fibrillation on chronic anticoagulation: Home  medications include Eliquis.  At this time patient appears to be relatively rate controlled. -Resume Eliquis once able to be weaned off BiPAP or consider heparin drip -Currently unable to give  Diastolic congestive heart failure: BNP was 125.9.  On physical exam patient with +1 pitting lower extremity edema.  Last EF noted to be 60 to 65% in 02/2020.  He had been given fluid bolus after initially being seen to be hypotensive which could have led to fluid overload. -Consider IV diuresis if needed  Acquired thrombocytopenia: Chronic  Platelet count 143. -Continue to monitor  Polymyalgia rheumatica: Chronic.  Patient is on chronic steroids.  OSA: Patient not on CPAP at  night due to being unable  to tolerate the mask.  Morbid obesity: BMI 44.83 kg/m  DVT prophylaxis: Eliquis Code Status: Full Family Communication: Wife updated at bedside Disposition Plan: To be determined Consults called: PCCM Admission status: Inpatient, require more than 2 midnight stay  Norval Morton MD [facility]   If 7PM-7AM, please contact night-coverage   12/29/2020, 11:56 AM

## 2020-12-29 NOTE — ED Provider Notes (Signed)
[facility] EMERGENCY DEPARTMENT Provider Note   CSN: [id number] Arrival date & time: 12/29/20  0750     History Chief Complaint  Patient presents with   Shortness of Breath    Garrett Munoz is a 75 y.o. male.  Patient with past medical history of CHF presents today with shortness of breath. He has had 2 days of fevers, chills, and shortness of breath with productive cough and green sputum.  Also endorses some recent weight gain.  No oxygen requirement at baseline.  Denies any pain at this time.  The history is provided by the patient. No language interpreter was used.  Shortness of Breath Associated symptoms: cough and fever   Associated symptoms: no chest pain, no diaphoresis, no headaches, no sore throat and no vomiting       Past Medical History:  Diagnosis Date   Atrial fibrillation ([place])    Bronchitis    hx   Cataract    right eye   CHF NYHA class III, acute on chronic, diastolic (HCC)    COPD (chronic obstructive pulmonary disease) (HCC)    DDD (degenerative disc disease), lumbosacral    Dysrhythmia    irregular heatbeat   Eye worm    right eye   Gout    History of kidney stones    Hypertension    Left knee DJD    Morbid obesity with BMI of 40.0-44.9, adult ([place])    Pneumonia    hx   Prediabetes    Radicular syndrome of left leg    S/P total knee replacement    right   Sleep apnea    can't use sleep study 10 yrs ago   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     Patient Active Problem List   Diagnosis Date Noted   Cough variant asthma vs UACS 10/16/2020   DOE (dyspnea on exertion) 10/16/2020   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 10/16/2020   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   High risk medication use 04/10/2020   CHF NYHA class III, acute on chronic, diastolic (HCC)    Physical deconditioning 03/11/2020   Prediabetes 03/10/2020   Acute CHF (congestive heart  failure) ([place]) 03/09/2020   Acute respiratory failure with hypoxia and hypercapnia (HCC)    Acquired thrombophilia ([place]) 10/19/2019   Irritable bowel syndrome (IBS) 02/27/2019   Bilateral carpal tunnel syndrome 08/28/2018   Eustachian tube dysfunction, left 02/09/2018   Sensorineural hearing loss (SNHL), 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   Tinnitus of both ears 02/09/2018   Spinal stenosis at L4-L5 level 08/18/2017   Spinal stenosis of lumbar region 07/19/2017   Degeneration of lumbosacral intervertebral disc 05/28/2017   Low back pain 05/28/2017   Gout 08/24/2016   S/P total knee replacement 08/24/2016   Cortical age-related cataract of left eye 08/24/2016   Occult blood in stools 11/10/2015   CAP (community acquired pneumonia) 04/10/2015   Fatigue 04/10/2015   Hypertensive disorder 04/10/2015   HCAP (healthcare-associated pneumonia)    Persistent atrial fibrillation ([place])    Anticoagulation adequate 02/21/2015   Obstructive sleep apnea syndrome 02/01/2015   Atrial fibrillation ([place]) 02/01/2015   History of gout 02/01/2015   Morbid obesity ([place]) 02/01/2015   Pain in left knee 02/24/2012   Weakness of left leg 02/24/2012   Infection by loa loa    Radicular syndrome of left leg    Left knee DJD     Past Surgical History:  Procedure Laterality  Date   CARDIOVERSION N/A 03/04/2015   Procedure: CARDIOVERSION;  Surgeon: [name], MD;  Location: [place];  Service: Cardiovascular;  Laterality: N/A;   CHOLECYSTECTOMY     COLONOSCOPY N/A 01/01/2016   Procedure: COLONOSCOPY;  Surgeon: [name], MD;  [facility];  Service: Endoscopy;  Laterality: N/A;  1:45   COLONOSCOPY N/A 01/14/2017   Procedure: COLONOSCOPY;  Surgeon: [name], MD;  [facility];  Service: Endoscopy;  Laterality: N/A;  12:05   EYE EXAMINATION UNDER ANESTHESIA W/ RETINAL CRYOTHERAPY AND RETINAL LASER  07/2011   EYE SURGERY  02/2011   cat rt   KNEE ARTHROSCOPY     bilateral    LUMBAR LAMINECTOMY/DECOMPRESSION MICRODISCECTOMY N/A 08/18/2017   Procedure: Bilateral microlumbar decompression Lumbar four-Lumbar five;  Surgeon: [name], MD;  Location: [place];  Service: Orthopedics;  Laterality: N/A;   POLYPECTOMY  01/01/2016   Procedure: POLYPECTOMY;  Surgeon: [name], MD;  [facility];  Service: Endoscopy;;  colon   RETINAL DETACHMENT SURGERY  03/2011   rt   TOTAL KNEE ARTHROPLASTY  2003   rt   TOTAL KNEE ARTHROPLASTY  02/07/2012   Procedure: TOTAL KNEE ARTHROPLASTY;  Surgeon: [name], MD;  Location: [place];  Service: Orthopedics;  Laterality: Left;       Family History  Problem Relation Age of Onset   Lung cancer Father     Social History   Tobacco Use   Smoking status: Former    Packs/day: 2.00    Years: 15.00    Pack years: 30.00    Types: Cigarettes    Quit date: 02/02/1992    Years since quitting: 28.9   Smokeless tobacco: Never   Tobacco comments:    occ alcohol  Vaping Use   Vaping Use: Never used  Substance Use Topics   Alcohol use: Yes    Alcohol/week: 0.0 standard drinks    Comment: occasional   Drug use: No    Home Medications Prior to Admission medications   Medication Sig Start Date End Date Taking? Authorizing Provider  albuterol (ACCUNEB) 1.25 MG/3ML nebulizer solution Take 1 ampule by nebulization every 4 (four) hours as needed for wheezing.    [provider]  albuterol (VENTOLIN HFA) 108 (90 Base) MCG/ACT inhaler Inhale 2 puffs into the lungs every 6 (six) hours as needed for wheezing or shortness of breath. 07/29/20   [name], MD  allopurinol (ZYLOPRIM) 300 MG tablet TAKE 1 TABLET (300 MG TOTAL) BY MOUTH DAILY. 12/31/19 12/30/20  [name], MD  apixaban (ELIQUIS) 5 MG TABS tablet TAKE 1 TABLET (5 MG TOTAL) BY MOUTH 2 (TWO) TIMES DAILY. 07/01/20 07/01/21  [name], MD  azelastine (ASTELIN) 0.1 % nasal spray Place 1 spray into both nostrils daily. Use in each nostril as  directed 12/26/20   Valentina Shaggy, MD  carvedilol (COREG) 12.5 MG tablet TAKE 1 & 1/2 TABLETS BY MOUTH EVERY MORNING AND 1 TABLET IN THE EVENING 04/03/20 04/03/21  [name], MD  fluticasone (FLONASE) 50 MCG/ACT nasal spray SHAKE LIQUID AND USE 2 SPRAYS IN EACH NOSTRIL DAILY Patient taking differently: Place 2 sprays into both nostrils daily as needed for allergies or rhinitis. 04/23/19   [name], MD  furosemide (LASIX) 20 MG tablet Take 1 tablet (20 mg total) by mouth daily. 12/04/20   [name], MD  gabapentin (NEURONTIN) 300 MG capsule TAKE 2 CAPSULES (600 MG  TOTAL) BY MOUTH 3 (THREE) TIMES DAILY. 04/25/20 04/25/21  [name], MD  levocetirizine (XYZAL) 5 MG tablet Take 1 tablet (5 mg total) by mouth every evening. 09/29/20   [name], MD  lidocaine in nystatin suspension Swish & spit with 5 ml's 3 times a day for 5 days then 3 times a day as needed 12/16/20   [name], MD  losartan (COZAAR) 25 MG tablet TAKE 1 TABLET (25 MG TOTAL) BY MOUTH DAILY. 06/26/20 06/26/21  [name], MD  magic mouthwash w/lidocaine SOLN Swish and spit 71m PO TID x5 days, then PRN- mouth pain. Steroid, Benadryl, Nystatin, Lidocaine 1:1 ratio. 12/16/20   PSusy Frizzle MD  montelukast (SINGULAIR) 10 MG tablet Take 1 tablet (10 mg total) by mouth at bedtime. 09/29/20   PSusy Frizzle MD  Multiple Vitamins-Minerals (MULTIVITAMIN WITH MINERALS) tablet Take 1 tablet by mouth daily. 03/12/20 03/12/21  GDwyane Dee MD  predniSONE (DELTASONE) 10 MG tablet Take 1 tablet (10 mg total) by mouth daily with breakfast. Patient taking differently: Take 20 mg by mouth daily with breakfast. Take 20 mg daily per patient. 09/29/20   Rice, CResa Miner MD  temazepam (RESTORIL) 30 MG capsule Take 1 capsule (30 mg total) by mouth at bedtime as needed for sleep 09/30/20   PSusy Frizzle MD  potassium chloride SA (KLOR-CON) 20 MEQ tablet Take 1 tablet (20 mEq total) by mouth  daily. Patient not taking: No sig reported 03/26/20 05/26/20  PSusy Frizzle MD    Allergies    Patient has no known allergies.  Review of Systems   Review of Systems  Constitutional:  Positive for fatigue and fever. Negative for chills and diaphoresis.  HENT:  Positive for congestion. Negative for postnasal drip, rhinorrhea, sore throat and voice change.   Respiratory:  Positive for cough and shortness of breath.        Endorses productive cough with green sputum  Cardiovascular:  Positive for leg swelling. Negative for chest pain and palpitations.  Gastrointestinal:  Positive for abdominal distention. Negative for diarrhea, nausea and vomiting.  Genitourinary:  Negative for difficulty urinating, dysuria, frequency and hematuria.  Skin:  Negative for pallor.  Neurological:  Negative for dizziness, tremors, seizures, syncope, facial asymmetry, speech difficulty, weakness, light-headedness, numbness and headaches.  Psychiatric/Behavioral:  Negative for confusion and decreased concentration.   All other systems reviewed and are negative.  Physical Exam Updated Vital Signs BP 124/77   Pulse 91   Temp 99.2 F (37.3 C)   Resp (!) 26   SpO2 94%   Physical Exam Vitals and nursing note reviewed.  Constitutional:      General: He is not in acute distress.    Appearance: He is well-developed. He is obese. He is ill-appearing and diaphoretic. He is not toxic-appearing.  HENT:     Mouth/Throat:     Mouth: Mucous membranes are moist.  Cardiovascular:     Rate and Rhythm: Regular rhythm. Tachycardia present.  Pulmonary:     Effort: Tachypnea and respiratory distress present.     Breath sounds: Examination of the left-middle field reveals decreased breath sounds. Examination of the left-lower field reveals decreased breath sounds, rhonchi and rales. Decreased breath sounds, rhonchi and rales present. No wheezing.  Chest:     Chest wall: No mass, tenderness or edema.  Abdominal:      General: Bowel sounds are normal.     Palpations: Abdomen is soft.  Musculoskeletal:  Cervical back: Normal range of motion.     Right lower leg: Edema present.     Left lower leg: Edema present.     Comments: 4+ pitting edema in bilateral lower extremities  Skin:    General: Skin is warm.  Neurological:     General: No focal deficit present.     Mental Status: He is alert.  Psychiatric:        Mood and Affect: Mood normal.        Behavior: Behavior normal.    ED Results / Procedures / Treatments   Labs (all labs ordered are listed, but only abnormal results are displayed) Labs Reviewed  CBC WITH DIFFERENTIAL/PLATELET - Abnormal; Notable for the following components:      Result Value   WBC 13.9 (*)    RBC 5.83 (*)    Hemoglobin 19.0 (*)    HCT 58.8 (*)    MCV 100.9 (*)    Platelets 143 (*)    Neutro Abs 11.7 (*)    All other components within normal limits  RESP PANEL BY RT-PCR (FLU A&B, COVID) ARPGX2  CULTURE, BLOOD (ROUTINE X 2)  CULTURE, BLOOD (ROUTINE X 2)  URINE CULTURE  LACTIC ACID, PLASMA  MAGNESIUM  BRAIN NATRIURETIC PEPTIDE  COMPREHENSIVE METABOLIC PANEL  LACTIC ACID, PLASMA  URINALYSIS, ROUTINE W REFLEX MICROSCOPIC  PROTIME-INR  APTT  TROPONIN I (HIGH SENSITIVITY)  TROPONIN I (HIGH SENSITIVITY)    EKG EKG Interpretation  Date/Time:  Monday December 29 2020 08:02:21 EDT Ventricular Rate:  102 PR Interval:    QRS Duration: 96 QT Interval:  314 QTC Calculation: 409 R Axis:   -52 Text Interpretation: Atrial fibrillation with rapid ventricular response Incomplete right bundle branch block Left anterior fascicular block Cannot rule out Inferior infarct , age undetermined Abnormal ECG Confirmed by Noemi Chapel [phone]) on 12/29/2020 8:05:26 AM  Radiology DG Chest Portable 1 View  Result Date: 12/29/2020 CLINICAL DATA:  75 year old male with shortness of breath this morning. Former smoker. EXAM: PORTABLE CHEST 1 VIEW COMPARISON:  Chest radiographs  10/16/2020 and earlier. FINDINGS: Portable AP semi upright view at 0855 hours. Patient is slightly rotated to the right. There is Patchy and confluent opacity throughout the right mid and lower lung. The diaphragm remains visible. Stable cardiac size and mediastinal contours. Mild underlying cardiomegaly. No pneumothorax. Left lung appears stable. Paucity of bowel gas in the upper abdomen. No acute osseous abnormality identified. IMPRESSION: Patchy and confluent opacity in the right mid and lower lung is nonspecific but suggestive of multilobar pneumonia. If there are signs/symptoms of infection then post treatment PA and lateral chest X-ray is recommended in 3-4 weeks to ensure resolution and exclude underlying malignancy. If not recommend Chest CT (IV contrast preferred) to further characterize. Electronically Signed   By: Genevie Ann M.D.   On: 12/29/2020 09:09    Procedures .Critical Care Performed by: Bud Face, PA-C Authorized by: Bud Face, PA-C   Critical care provider statement:    Critical care time (minutes):  35   Critical care was necessary to treat or prevent imminent or life-threatening deterioration of the following conditions:  Respiratory failure and sepsis   Critical care was time spent personally by me on the following activities:  Development of treatment plan with patient or surrogate, examination of patient, pulse oximetry, ordering and performing treatments and interventions, ordering and review of laboratory studies, ordering and review of radiographic studies, re-evaluation of patient's condition, review of old charts, obtaining history  from patient or surrogate and interpretation of cardiac output measurements   Care discussed with: admitting provider     Medications Ordered in ED Medications  cefTRIAXone (ROCEPHIN) 2 g in sodium chloride 0.9 % 100 mL IVPB (0 g Intravenous Stopped 12/29/20 0951)  azithromycin (ZITHROMAX) 500 mg in sodium chloride 0.9 % 250 mL IVPB (500  mg Intravenous New Bag/Given 12/29/20 0950)  albuterol (PROVENTIL,VENTOLIN) solution continuous neb (has no administration in time range)  LORazepam (ATIVAN) injection 0.5 mg (has no administration in time range)  albuterol (PROVENTIL) (2.5 MG/3ML) 0.083% nebulizer solution 10 mg (has no administration in time range)  furosemide (LASIX) injection 20 mg (20 mg Intravenous Given 12/29/20 0901)  acetaminophen (TYLENOL) tablet 325 mg (325 mg Oral Given 12/29/20 [id number])    ED Course  I have reviewed the triage vital signs and the nursing notes.  Pertinent labs & imaging results that were available during my care of the patient were reviewed by me and considered in my medical decision making (see chart for details).    MDM Rules/Calculators/A&P                          Patient with history of congestive heart failure presents today with 2 days of fevers, chills, and shortness of breath. Found to be hypoxic on presentation in the 70s, brought up to 20 on 6L [place]. CXR performed, showed RLL pneumonia.  Is febrile, leukocytosis noted at 14.  Code sepsis called, started on Rocephin and azithromycin.  Initially no fluids required due to normal blood pressures, however maps dropped below 65 and fluid resuscitation was initiated.  Due to sepsis and combined respiratory failure with new oxygen requirement, will admit to hospitalist at this time.  Patient is amenable to plan.    Final Clinical Impression(s) / ED Diagnoses Final diagnoses:  Community acquired pneumonia of right lower lobe of lung  Acute respiratory failure with hypoxia (HCC)  Sepsis, due to unspecified organism, unspecified whether acute organ dysfunction present [facility])    Rx / DC Orders ED Discharge Orders     None        Nestor Lewandowsky 12/29/20 1317    Noemi Chapel, MD 01/02/21 [phone]

## 2020-12-29 NOTE — Procedures (Signed)
Bronchoscopy Procedure Note  Garrett Munoz  [id number]  1945-11-25  Date:12/29/20  Time:5:02 PM   Provider Performing:Ashliegh Parekh C Tamala Julian   Procedure(s):  Flexible bronchoscopy with bronchial alveolar lavage [phone])  Indication(s) Abnormal CXR, pneumonia  Consent Verbally from wife at bedside  Anesthesia In place for intubation  Time Out Verified patient identification, verified procedure, site/side was marked, verified correct patient position, special equipment/implants available, medications/allergies/relevant history reviewed, required imaging and test results available.   Sterile Technique Usual hand hygiene, masks, gowns, and gloves were used   Procedure Description Bronchoscope advanced through endotracheal tube and into airway.  Airways were examined down to subsegmental level with findings noted below.   Following diagnostic evaluation, BAL(s) performed in LLL with normal saline and return of debris-filled cloudy fluid  Findings:  Diffuse airway erythema worse on R ETT pulled back slightly, now ~5 cm above carina BAL c/w aspiration  Complications/Tolerance None; patient tolerated the procedure well. Chest X-ray is not needed post procedure.   EBL Minimal   Specimen(s) RLL BAL

## 2020-12-29 NOTE — Procedures (Signed)
Intubation Procedure Note  Garrett Munoz  [id number]  October 23, 1945  Date:12/29/20  Time:5:01 PM   Provider Performing:Danil Wedge C Tamala Julian    Procedure: Intubation (31500)  Indication(s) Respiratory Failure  Consent Obtained verbally from wife at bedside  Anesthesia Etomidate, Versed, Fentanyl, and Succinylcholine   Time Out Verified patient identification, verified procedure, site/side was marked, verified correct patient position, special equipment/implants available, medications/allergies/relevant history reviewed, required imaging and test results available.   Sterile Technique Usual hand hygeine, masks, and gloves were used   Procedure Description Patient positioned in bed supine.  Sedation given as noted above.  Patient was intubated with endotracheal tube using Glidescope.  View was Grade 2 only posterior commissure .  Number of attempts was 1.  Colorimetric CO2 detector was consistent with tracheal placement.   Complications/Tolerance Copious bilious material came up, suctioned but some went into lungs, OGT inserted and placed to suction Chest X-ray is ordered to verify placement.   EBL Minimal   Specimen(s) None

## 2020-12-29 NOTE — ED Notes (Signed)
Report given to Phineas Douglas, RN

## 2020-12-30 ENCOUNTER — Ambulatory Visit: Payer: PPO | Admitting: Family Medicine

## 2020-12-30 ENCOUNTER — Telehealth: Payer: Self-pay | Admitting: Family Medicine

## 2020-12-30 ENCOUNTER — Inpatient Hospital Stay (HOSPITAL_COMMUNITY): Payer: PPO

## 2020-12-30 DIAGNOSIS — R0602 Shortness of breath: Secondary | ICD-10-CM | POA: Diagnosis not present

## 2020-12-30 DIAGNOSIS — J9601 Acute respiratory failure with hypoxia: Secondary | ICD-10-CM | POA: Diagnosis not present

## 2020-12-30 DIAGNOSIS — J9602 Acute respiratory failure with hypercapnia: Secondary | ICD-10-CM | POA: Diagnosis not present

## 2020-12-30 LAB — GLUCOSE, CAPILLARY
Glucose-Capillary: 118 mg/dL — ABNORMAL HIGH (ref 70–99)
Glucose-Capillary: 122 mg/dL — ABNORMAL HIGH (ref 70–99)
Glucose-Capillary: 129 mg/dL — ABNORMAL HIGH (ref 70–99)
Glucose-Capillary: 139 mg/dL — ABNORMAL HIGH (ref 70–99)
Glucose-Capillary: 140 mg/dL — ABNORMAL HIGH (ref 70–99)
Glucose-Capillary: 146 mg/dL — ABNORMAL HIGH (ref 70–99)
Glucose-Capillary: 158 mg/dL — ABNORMAL HIGH (ref 70–99)

## 2020-12-30 LAB — BASIC METABOLIC PANEL
Anion gap: 9 (ref 5–15)
Anion gap: 9 (ref 5–15)
BUN: 11 mg/dL (ref 8–23)
BUN: 15 mg/dL (ref 8–23)
CO2: 32 mmol/L (ref 22–32)
CO2: 33 mmol/L — ABNORMAL HIGH (ref 22–32)
Calcium: 8.5 mg/dL — ABNORMAL LOW (ref 8.9–10.3)
Calcium: 7.9 mg/dL — ABNORMAL LOW (ref 8.9–10.3)
Chloride: 96 mmol/L — ABNORMAL LOW (ref 98–111)
Chloride: 96 mmol/L — ABNORMAL LOW (ref 98–111)
Creatinine, Ser: 1.09 mg/dL (ref 0.61–1.24)
Creatinine, Ser: 1.07 mg/dL (ref 0.61–1.24)
GFR, Estimated: >60 mL/min (ref >60)
GFR, Estimated: >60 mL/min (ref >60)
Glucose, Bld: 119 mg/dL — ABNORMAL HIGH (ref 70–99)
Glucose, Bld: 122 mg/dL — ABNORMAL HIGH (ref 70–99)
Potassium: 4.2 mmol/L (ref 3.5–5.1)
Potassium: 3 mmol/L — ABNORMAL LOW (ref 3.5–5.1)
Sodium: 137 mmol/L (ref 135–145)
Sodium: 138 mmol/L (ref 135–145)

## 2020-12-30 LAB — ECHOCARDIOGRAM COMPLETE
Area-P 1/2: 4.21 cm2
Calc EF: 50.5 %
Height: 70 in
S' Lateral: 3.6 cm
Single Plane A2C EF: 46.1 %
Single Plane A4C EF: 56 %
Weight: 5276.93 oz

## 2020-12-30 LAB — HEMOGLOBIN A1C
Hgb A1c MFr Bld: 5.8 % — ABNORMAL HIGH (ref 4.8–5.6)
Mean Plasma Glucose: 119.76 mg/dL

## 2020-12-30 LAB — LEGIONELLA PNEUMOPHILA SEROGP 1 UR AG: L. pneumophila Serogp 1 Ur Ag: NEGATIVE

## 2020-12-30 LAB — URINE CULTURE
Culture: NO GROWTH
Special Requests: NORMAL

## 2020-12-30 LAB — CBC
HCT: 50.5 % (ref 39.0–52.0)
Hemoglobin: 16.7 g/dL (ref 13.0–17.0)
MCH: 31.9 pg (ref 26.0–34.0)
MCHC: 33.1 g/dL (ref 30.0–36.0)
MCV: 96.6 fL (ref 80.0–100.0)
Platelets: 155 10*3/uL (ref 150–400)
RBC: 5.23 MIL/uL (ref 4.22–5.81)
RDW: 13.8 % (ref 11.5–15.5)
WBC: 20.4 10*3/uL — ABNORMAL HIGH (ref 4.0–10.5)
nRBC: 0 % (ref 0.0–0.2)

## 2020-12-30 LAB — PHOSPHORUS
Phosphorus: <1 mg/dL — CL (ref 2.5–4.6)
Phosphorus: 3.2 mg/dL (ref 2.5–4.6)

## 2020-12-30 LAB — MAGNESIUM: Magnesium: 1.7 mg/dL (ref 1.7–2.4)

## 2020-12-30 LAB — APTT
aPTT: 58 seconds — ABNORMAL HIGH (ref 24–36)
aPTT: 113 seconds — ABNORMAL HIGH (ref 24–36)

## 2020-12-30 LAB — HEPARIN LEVEL (UNFRACTIONATED)
Heparin Unfractionated: 0.4 IU/mL (ref 0.30–0.70)
Heparin Unfractionated: 0.62 IU/mL (ref 0.30–0.70)

## 2020-12-30 LAB — TRIGLYCERIDES: Triglycerides: 66 mg/dL (ref <150)

## 2020-12-30 MED ORDER — GUAIFENESIN 200 MG PO TABS
300.0000 mg | ORAL_TABLET | Freq: Four times a day (QID) | ORAL | Status: DC
  Administered 2020-12-30 – 2020-12-31 (×5): 300 mg
  Filled 2020-12-30 (×5): qty 2

## 2020-12-30 MED ORDER — FENTANYL CITRATE (PF) 100 MCG/2ML IJ SOLN
50.0000 ug | Freq: Once | INTRAMUSCULAR | Status: AC
  Administered 2020-12-30: 50 ug via INTRAVENOUS
  Filled 2020-12-30: qty 2

## 2020-12-30 MED ORDER — MAGNESIUM SULFATE 2 GM/50ML IV SOLN
2.0000 g | Freq: Once | INTRAVENOUS | Status: AC
  Administered 2020-12-30: 2 g via INTRAVENOUS
  Filled 2020-12-30: qty 50

## 2020-12-30 MED ORDER — FENTANYL CITRATE (PF) 100 MCG/2ML IJ SOLN
25.0000 ug | INTRAMUSCULAR | Status: DC | PRN
  Administered 2020-12-30 – 2020-12-31 (×9): 100 ug via INTRAVENOUS
  Filled 2020-12-30 (×10): qty 2

## 2020-12-30 MED ORDER — VITAMIN D 25 MCG (1000 UNIT) PO TABS
500.0000 [IU] | ORAL_TABLET | Freq: Every day | ORAL | Status: DC
  Administered 2020-12-30 – 2020-12-31 (×2): 500 [IU]
  Filled 2020-12-30 (×2): qty 1

## 2020-12-30 MED ORDER — PROSOURCE TF PO LIQD
45.0000 mL | Freq: Two times a day (BID) | ORAL | Status: DC
  Administered 2020-12-30: 45 mL
  Filled 2020-12-30: qty 45

## 2020-12-30 MED ORDER — PERFLUTREN LIPID MICROSPHERE
1.0000 mL | INTRAVENOUS | Status: AC | PRN
  Administered 2020-12-30: 3 mL via INTRAVENOUS
  Filled 2020-12-30: qty 10

## 2020-12-30 MED ORDER — VITAL HIGH PROTEIN PO LIQD
1000.0000 mL | ORAL | Status: DC
  Administered 2020-12-30: 1000 mL

## 2020-12-30 MED ORDER — K PHOS MONO-SOD PHOS DI & MONO 155-852-130 MG PO TABS
500.0000 mg | ORAL_TABLET | Freq: Three times a day (TID) | ORAL | Status: DC
  Administered 2020-12-30 – 2020-12-31 (×4): 500 mg
  Filled 2020-12-30 (×4): qty 2

## 2020-12-30 MED ORDER — CHLORHEXIDINE GLUCONATE 0.12% ORAL RINSE (MEDLINE KIT)
15.0000 mL | Freq: Two times a day (BID) | OROMUCOSAL | Status: DC
  Administered 2020-12-30 – 2021-01-07 (×13): 15 mL via OROMUCOSAL

## 2020-12-30 MED ORDER — ORAL CARE MOUTH RINSE
15.0000 mL | OROMUCOSAL | Status: DC
  Administered 2020-12-30 – 2021-01-01 (×21): 15 mL via OROMUCOSAL

## 2020-12-30 MED ORDER — FUROSEMIDE 10 MG/ML IJ SOLN
40.0000 mg | Freq: Four times a day (QID) | INTRAMUSCULAR | Status: AC
Start: 2020-12-30 — End: 2020-12-30
  Administered 2020-12-30 (×2): 40 mg via INTRAVENOUS
  Filled 2020-12-30 (×2): qty 4

## 2020-12-30 MED ORDER — VITAL HIGH PROTEIN PO LIQD
1000.0000 mL | ORAL | Status: DC
  Administered 2020-12-30 – 2020-12-31 (×2): 1000 mL

## 2020-12-30 MED ORDER — SODIUM PHOSPHATES 45 MMOLE/15ML IV SOLN
30.0000 mmol | Freq: Once | INTRAVENOUS | Status: AC
  Administered 2020-12-30: 30 mmol via INTRAVENOUS
  Filled 2020-12-30: qty 10

## 2020-12-30 MED ORDER — FENTANYL BOLUS VIA INFUSION
25.0000 ug | INTRAVENOUS | Status: DC | PRN
  Filled 2020-12-30: qty 100

## 2020-12-30 MED ORDER — PROSOURCE TF PO LIQD
45.0000 mL | Freq: Three times a day (TID) | ORAL | Status: DC
  Administered 2020-12-30 – 2020-12-31 (×2): 45 mL
  Filled 2020-12-30 (×2): qty 45

## 2020-12-30 MED ORDER — CALCIUM CITRATE 950 (200 CA) MG PO TABS
200.0000 mg | ORAL_TABLET | Freq: Every day | ORAL | Status: DC
  Administered 2020-12-30 – 2021-01-01 (×3): 200 mg
  Filled 2020-12-30 (×3): qty 1

## 2020-12-30 MED ORDER — FENTANYL CITRATE (PF) 100 MCG/2ML IJ SOLN: 25.0000 ug | INTRAMUSCULAR | Status: DC | PRN

## 2020-12-30 MED ORDER — CALCIUM CITRATE-VITAMIN D 500-500 MG-UNIT PO CHEW
1.0000 | CHEWABLE_TABLET | Freq: Every day | ORAL | Status: DC
  Filled 2020-12-30: qty 1

## 2020-12-30 MED ORDER — PREDNISONE 10 MG PO TABS
10.0000 mg | ORAL_TABLET | Freq: Every day | ORAL | Status: DC
  Administered 2020-12-30 – 2020-12-31 (×2): 10 mg
  Filled 2020-12-30 (×2): qty 1

## 2020-12-30 MED ORDER — METOCLOPRAMIDE HCL 5 MG/ML IJ SOLN
10.0000 mg | Freq: Four times a day (QID) | INTRAMUSCULAR | Status: AC
  Administered 2020-12-30 – 2021-01-01 (×8): 10 mg via INTRAVENOUS
  Filled 2020-12-30 (×8): qty 2

## 2020-12-30 NOTE — Telephone Encounter (Signed)
Patient's wife canceled today's appointment via voicemail; wants provider to know patient in ICU.

## 2020-12-30 NOTE — Progress Notes (Signed)
NAME:  Garrett Munoz, MRN:  [id number], DOB:  1946-01-28, LOS: 1 ADMISSION DATE:  12/29/2020, CONSULTATION DATE:  12/29/20 REFERRING MD:  Tamala Julian CHIEF COMPLAINT:  acute hypercarbic respiratory failure    History of Present Illness:  75 yo male with known COPD, pAFib, diastolic heart failure admitted with acute hypercarbic respiratory failure secondary to pneumonia.   Patient follows with Dr [name] for pulmonary outpatient for upper airway cough syndrome and dyspnea on exertion since Nov 2021. Patient presented to ED today for complaints of shortness of breath.  Vital signs on presentation: Temp 100.6, HR 109, RR 22, SpO2 88% on 6 L [place].  He was increased to 10 L [place] with improvement in oxygenation.  Imaging with new right lower lobe consolidation. Patient was started on antibiotics with Azithromycin and Rocephin.  Patient was later found to be unresponsive and ABG revealed hypercarbic respiratory failure with ABG: 7/119/91/34. He required intubation and mechanical ventilation. Work up for infection is in progress. Antibiotics have been broadened to Cefepime.    At time of eval patient remains somnolent saturating in low 90s on BIPAP, wife at bedside, states he has not felt well for past 10 months.  Pertinent  Medical History  Afib COPD Hypertension Pre-diabetes OSA CHF, NYHA class III  Significant Hospital Events: Including procedures, antibiotic start and stop dates in addition to other pertinent events   9/12 Admit to [facility], intubation, central line  Interim History / Subjective:   Pressor support weaning. No acute overnight events. Sedated this AM.   Objective   Blood pressure 108/77, pulse 74, temperature 99.4 F (37.4 C), temperature source Axillary, resp. rate (!) 24, height _0  (1.778 m), weight (!) 149.6 kg, SpO2 96 %.    Vent Mode: PRVC FiO2 (%):  [70 %-100 %] 70 % Set Rate:  [24 bmp] 24 bmp Vt Set:  [580 mL] 580 mL PEEP:  [12 cmH20] 12 cmH20 Plateau Pressure:  [24  cmH20-33 cmH20] 24 cmH20   Intake/Output Summary (Last 24 hours) at 12/30/2020 0745 Last data filed at 12/30/2020 0500 Gross per 24 hour  Intake 4650.28 ml  Output 800 ml  Net 3850.28 ml   Filed Weights   12/29/20 1152 12/30/20 0714  Weight: (!) 145.8 kg (!) 149.6 kg   Examination: General: Critically ill appearing male.  HENT: Mucus membranes moist.  Lungs: Intubated. Clear to auscultation bilaterally.  Cardiovascular: Irregularly irregular. No tachycardia.  Abdomen: Soft, nontender. Hypoactive bowel sounds.  Extremities: 1+ pitting edema Neuro: Sedated.   Resolved Hospital Problem list   N/A  Assessment & Plan:   Septic Shock  Secondary to CAP. Pressor needs are improving.   - Continue Levophed to maintain MAP > 65. Wean as tolerated.   Aspiration Pneumonia  Acute Hypoxic and Hypercarbic Respiratory Failure  Suspected aspiration pneumonia in the setting of chronic dysphagia. Seen by ENT previously with normal barium swallow. BAL cultures still pending.   - Continue full vent support  - Daily SBTs - VAP measures in place - Propofol and Fentanyl for sedation - Follow up bronchoscopy results- cultures, cell count still pending  - Continue Azithromycin and Ceftriaxone, Day 2 - SLP evaluation when sedation is weaned  HFpEF  Mildly hypervolemic on examination.   - Repeat Echo  - Lasix   A. Fib  - Holding home Coreg due to hypotension - Heparin gtt for stroke prevention   Pre-diabetes A1c of 5.8%. Hyperglycemic in the setting of critical illness  - SSI  Polymyalgia Rheumatica  -  s/p stress dose steroids on 9/12 - Restart home Prednisone   Hypophosphatemia  - Replace PRN - Monitor daily   Best Practice (right click and "Reselect all SmartList Selections" daily)   Diet/type: NPO DVT prophylaxis: systemic heparin GI prophylaxis: PPI Lines: Central line and yes and it is still needed Foley:  Yes, and it is still needed Code Status:  full code Last date  of multidisciplinary goals of care discussion [Family updated at bedside on 9/13]  Labs   CBC: Recent Labs  Lab 12/29/20 0839 12/29/20 1714 12/30/20 0247  WBC 13.9*  --  20.4*  NEUTROABS 11.7*  --   --   HGB 19.0* 17.3* 16.7  HCT 58.8* 51.0 50.5  MCV 100.9*  --  96.6  PLT 143*  --  751    Basic Metabolic Panel: Recent Labs  Lab 12/29/20 0839 12/29/20 1714 12/30/20 0247  NA 140 133* 137  K 4.4 4.3 4.2  CL 96*  --  96*  CO2 36*  --  32  GLUCOSE 88  --  119*  BUN 12  --  11  CREATININE 1.06  --  1.09  CALCIUM 9.3  --  8.5*  MG 1.8  --  1.7  PHOS  --   --  <1.0*   GFR: Estimated Creatinine Clearance: 84.5 mL/min (by C-G formula based on SCr of 1.09 mg/dL). Recent Labs  Lab 12/29/20 0839 12/29/20 1041 12/29/20 1252 12/30/20 0247  PROCALCITON  --   --  <0.10  --   WBC 13.9*  --   --  20.4*  LATICACIDVEN 1.7 1.3  --   --     Liver Function Tests: Recent Labs  Lab 12/29/20 0839  AST 20  ALT 29  ALKPHOS 60  BILITOT 0.9  PROT 7.2  ALBUMIN 3.8   No results for input(s): LIPASE, AMYLASE in the last 168 hours. No results for input(s): AMMONIA in the last 168 hours.  ABG    Component Value Date/Time   PHART 7.376 12/29/2020 1714   PCO2ART 54.7 (H) 12/29/2020 1714   PO2ART 123 (H) 12/29/2020 1714   HCO3 32.0 (H) 12/29/2020 1714   TCO2 34 (H) 12/29/2020 1714   O2SAT 99.0 12/29/2020 1714     Coagulation Profile: Recent Labs  Lab 12/29/20 1045  INR 1.0    Cardiac Enzymes: No results for input(s): CKTOTAL, CKMB, CKMBINDEX, TROPONINI in the last 168 hours.  HbA1C: Hgb A1c MFr Bld  Date/Time Value Ref Range Status  12/30/2020 02:47 AM 5.8 (H) 4.8 - 5.6 % Final    Comment:    (NOTE) Pre diabetes:          5.7%-6.4%  Diabetes:              >6.4%  Glycemic control for   <7.0% adults with diabetes   03/10/2020 04:26 AM 5.8 (H) 4.8 - 5.6 % Final    Comment:    (NOTE) Pre diabetes:          5.7%-6.4%  Diabetes:              >6.4%  Glycemic  control for   <7.0% adults with diabetes    CBG: Recent Labs  Lab 12/29/20 1615 12/29/20 2008 12/30/20 0002 12/30/20 0353 12/30/20 0730  GLUCAP 118* 202* 158* 139* 122*   Review of Systems:   Negative except as noted above.   Past Medical History:  He,  has a past medical history of Atrial fibrillation ([place]), Bronchitis, Cataract, CHF  NYHA class III, acute on chronic, diastolic (HCC), COPD (chronic obstructive pulmonary disease) ([place]), DDD (degenerative disc disease), lumbosacral, Dysrhythmia, Eye worm, Gout, History of kidney stones, Hypertension, Left knee DJD, Morbid obesity with BMI of 40.0-44.9, adult ([place]), Pneumonia, Prediabetes, Radicular syndrome of left leg, S/P total knee replacement, Sleep apnea, and 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.   Surgical History:   Past Surgical History:  Procedure Laterality Date   CARDIOVERSION N/A 03/04/2015   Procedure: CARDIOVERSION;  Surgeon: [name], MD;  Location: [place];  Service: Cardiovascular;  Laterality: N/A;   CHOLECYSTECTOMY     COLONOSCOPY N/A 01/01/2016   Procedure: COLONOSCOPY;  Surgeon: [name], MD;  [facility];  Service: Endoscopy;  Laterality: N/A;  1:45   COLONOSCOPY N/A 01/14/2017   Procedure: COLONOSCOPY;  Surgeon: [name], MD;  [facility];  Service: Endoscopy;  Laterality: N/A;  12:05   EYE EXAMINATION UNDER ANESTHESIA W/ RETINAL CRYOTHERAPY AND RETINAL LASER  07/2011   EYE SURGERY  02/2011   cat rt   KNEE ARTHROSCOPY     bilateral   LUMBAR LAMINECTOMY/DECOMPRESSION MICRODISCECTOMY N/A 08/18/2017   Procedure: Bilateral microlumbar decompression Lumbar four-Lumbar five;  Surgeon: [name], MD;  Location: [place];  Service: Orthopedics;  Laterality: N/A;   POLYPECTOMY  01/01/2016   Procedure: POLYPECTOMY;  Surgeon: [name], MD;  [facility];  Service: Endoscopy;;  colon   RETINAL DETACHMENT SURGERY  03/2011   rt   TOTAL KNEE ARTHROPLASTY  2003    rt   TOTAL KNEE ARTHROPLASTY  02/07/2012   Procedure: TOTAL KNEE ARTHROPLASTY;  Surgeon: [name], MD;  Location: [place];  Service: Orthopedics;  Laterality: Left;     Social History:   reports that he quit smoking about 28 years ago. His smoking use included cigarettes. He has a 30.00 pack-year smoking history. He has never used smokeless tobacco. He reports current alcohol use. He reports that he does not use drugs.   Family History:  His family history includes Lung cancer in his father.   Allergies No Known Allergies   Home Medications  Prior to Admission medications   Medication Sig Start Date End Date Taking? Authorizing Provider  albuterol (ACCUNEB) 1.25 MG/3ML nebulizer solution Take 1 ampule by nebulization every 4 (four) hours as needed for wheezing.   Yes [provider]  albuterol (VENTOLIN HFA) 108 (90 Base) MCG/ACT inhaler Inhale 2 puffs into the lungs every 6 (six) hours as needed for wheezing or shortness of breath. 07/29/20  Yes [name], MD  apixaban (ELIQUIS) 5 MG TABS tablet TAKE 1 TABLET (5 MG TOTAL) BY MOUTH 2 (TWO) TIMES DAILY. Patient taking differently: Take 5 mg by mouth 2 (two) times daily. 07/01/20 07/01/21 Yes [name], MD  azelastine (ASTELIN) 0.1 % nasal spray Place 1 spray into both nostrils daily. Use in each nostril as directed Patient taking differently: Place 1 spray into both nostrils daily as needed for rhinitis. 12/26/20  Yes Valentina Shaggy, MD  carvedilol (COREG) 12.5 MG tablet TAKE 1 & 1/2 TABLETS BY MOUTH EVERY MORNING AND 1 TABLET IN THE EVENING Patient taking differently: Take 6.25-12.5 mg by mouth See admin instructions. Take half tablet by mouth in the morning and then take 1 whole tablet by mouth in the evening per wife 04/03/20 04/03/21 Yes [name], MD  fluticasone (FLONASE) 50 MCG/ACT nasal spray SHAKE LIQUID AND USE 2 SPRAYS IN EACH NOSTRIL DAILY  Patient taking differently: Place 2 sprays into both  nostrils daily as needed for allergies or rhinitis. 04/23/19  Yes [name], MD  furosemide (LASIX) 20 MG tablet Take 1 tablet (20 mg total) by mouth daily. 12/04/20  Yes [name], MD  gabapentin (NEURONTIN) 300 MG capsule TAKE 2 CAPSULES (600 MG TOTAL) BY MOUTH 3 (THREE) TIMES DAILY. Patient taking differently: Take 600 mg by mouth 3 (three) times daily. 04/25/20 04/25/21 Yes PickardCammie Mcgee, MD  losartan (COZAAR) 25 MG tablet TAKE 1 TABLET (25 MG TOTAL) BY MOUTH DAILY. Patient taking differently: Take 25 mg by mouth daily. 06/26/20 06/26/21 Yes PickardCammie Mcgee, MD  montelukast (SINGULAIR) 10 MG tablet Take 1 tablet (10 mg total) by mouth at bedtime. 09/29/20  Yes [name], MD  Multiple Vitamins-Minerals (MULTIVITAMIN WITH MINERALS) tablet Take 1 tablet by mouth daily. 03/12/20 03/12/21 Yes Dwyane Dee, MD  predniSONE (DELTASONE) 10 MG tablet Take 1 tablet (10 mg total) by mouth daily with breakfast. 09/29/20  Yes Rice, Resa Miner, MD  temazepam (RESTORIL) 30 MG capsule Take 1 capsule (30 mg total) by mouth at bedtime as needed for sleep Patient taking differently: Take 30 mg by mouth at bedtime. 09/30/20  Yes [name], MD  allopurinol (ZYLOPRIM) 300 MG tablet TAKE 1 TABLET (300 MG TOTAL) BY MOUTH DAILY. Patient not taking: Reported on 12/29/2020 12/31/19 12/30/20  [name], MD  levocetirizine (XYZAL) 5 MG tablet Take 1 tablet (5 mg total) by mouth every evening. Patient not taking: Reported on 12/29/2020 09/29/20   [name], MD  lidocaine in nystatin suspension Swish & spit with 5 ml's 3 times a day for 5 days then 3 times a day as needed Patient not taking: Reported on 12/29/2020 12/16/20   [name], MD  magic mouthwash w/lidocaine SOLN Swish and spit 55m PO TID x5 days, then PRN- mouth pain. Steroid, Benadryl, Nystatin, Lidocaine 1:1 ratio. Patient not taking: Reported on 12/29/2020 12/16/20   PSusy Frizzle MD  potassium chloride SA  (KLOR-CON) 20 MEQ tablet Take 1 tablet (20 mEq total) by mouth daily. Patient not taking: No sig reported 03/26/20 05/26/20  PSusy Frizzle MD   Dr. IJose PersiaInternal Medicine PGY-3 12/30/2020, 7:45 AM

## 2020-12-30 NOTE — Progress Notes (Signed)
ANTICOAGULATION CONSULT NOTE - Initial Consult  Pharmacy Consult for heparin Indication: atrial fibrillation  No Known Allergies  Patient Measurements: Height: '5\' 10"'$  (177.8 cm) Weight: (!) 149.6 kg (329 lb 12.9 oz) IBW/kg (Calculated) : 73 Heparin Dosing Weight: 109 kg   Vital Signs: Temp: 99.4 F (37.4 C) (09/13 0700) Temp Source: Axillary (09/13 0700) BP: 104/71 (09/13 1230) Pulse Rate: 68 (09/13 1230)  Labs: Recent Labs    12/29/20 0839 12/29/20 1045 12/29/20 1714 12/30/20 0247 12/30/20 1200  HGB 19.0*  --  17.3* 16.7  --   HCT 58.8*  --  51.0 50.5  --   PLT 143*  --   --  155  --   APTT  --  24  --  58* 113*  LABPROT  --  13.4  --   --   --   INR  --  1.0  --   --   --   HEPARINUNFRC  --   --   --  0.40  --   CREATININE 1.06  --   --  1.09  --   TROPONINIHS 14 14  --   --   --      Estimated Creatinine Clearance: 84.5 mL/min (by C-G formula based on SCr of 1.09 mg/dL).   Medical History: Past Medical History:  Diagnosis Date   Atrial fibrillation ([place])    Bronchitis    hx   Cataract    right eye   CHF NYHA class III, acute on chronic, diastolic (HCC)    COPD (chronic obstructive pulmonary disease) (HCC)    DDD (degenerative disc disease), lumbosacral    Dysrhythmia    irregular heatbeat   Eye worm    right eye   Gout    History of kidney stones    Hypertension    Left knee DJD    Morbid obesity with BMI of 40.0-44.9, adult (HCC)    Pneumonia    hx   Prediabetes    Radicular syndrome of left leg    S/P total knee replacement    right   Sleep apnea    can't use sleep study 10 yrs ago   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     Medications:  Medications Prior to Admission  Medication Sig Dispense Refill Last Dose   albuterol (ACCUNEB) 1.25 MG/3ML nebulizer solution Take 1 ampule by nebulization every 4 (four) hours as needed for wheezing.   12/29/2020   albuterol (VENTOLIN HFA) 108 (90 Base) MCG/ACT inhaler Inhale 2 puffs into the lungs every 6  (six) hours as needed for wheezing or shortness of breath. 8.5 g 0 12/28/2020   apixaban (ELIQUIS) 5 MG TABS tablet TAKE 1 TABLET (5 MG TOTAL) BY MOUTH 2 (TWO) TIMES DAILY. (Patient taking differently: Take 5 mg by mouth 2 (two) times daily.) 180 tablet 1 12/28/2020 at 1000   azelastine (ASTELIN) 0.1 % nasal spray Place 1 spray into both nostrils daily. Use in each nostril as directed (Patient taking differently: Place 1 spray into both nostrils daily as needed for rhinitis.) 30 mL 12 Past Week   carvedilol (COREG) 12.5 MG tablet TAKE 1 & 1/2 TABLETS BY MOUTH EVERY MORNING AND 1 TABLET IN THE EVENING (Patient taking differently: Take 6.25-12.5 mg by mouth See admin instructions. Take half tablet by mouth in the morning and then take 1 whole tablet by mouth in the evening per wife) 225 tablet 3 12/28/2020 at 1000   fluticasone (FLONASE) 50 MCG/ACT nasal spray SHAKE  LIQUID AND USE 2 SPRAYS IN EACH NOSTRIL DAILY (Patient taking differently: Place 2 sprays into both nostrils daily as needed for allergies or rhinitis.) 16 g 6 Past Week   furosemide (LASIX) 20 MG tablet Take 1 tablet (20 mg total) by mouth daily. 30 tablet 3 12/28/2020   gabapentin (NEURONTIN) 300 MG capsule TAKE 2 CAPSULES (600 MG TOTAL) BY MOUTH 3 (THREE) TIMES DAILY. (Patient taking differently: Take 600 mg by mouth 3 (three) times daily.) 540 capsule 3 12/28/2020   losartan (COZAAR) 25 MG tablet TAKE 1 TABLET (25 MG TOTAL) BY MOUTH DAILY. (Patient taking differently: Take 25 mg by mouth daily.) 90 tablet 3 12/28/2020   montelukast (SINGULAIR) 10 MG tablet Take 1 tablet (10 mg total) by mouth at bedtime. 90 tablet 3 Past Month   Multiple Vitamins-Minerals (MULTIVITAMIN WITH MINERALS) tablet Take 1 tablet by mouth daily. 120 tablet 2 12/28/2020   predniSONE (DELTASONE) 10 MG tablet Take 1 tablet (10 mg total) by mouth daily with breakfast. 90 tablet 1 12/28/2020   temazepam (RESTORIL) 30 MG capsule Take 1 capsule (30 mg total) by mouth at bedtime as  needed for sleep (Patient taking differently: Take 30 mg by mouth at bedtime.) 30 capsule 2 12/28/2020   allopurinol (ZYLOPRIM) 300 MG tablet TAKE 1 TABLET (300 MG TOTAL) BY MOUTH DAILY. (Patient not taking: Reported on 12/29/2020) 90 tablet 3 Not Taking   levocetirizine (XYZAL) 5 MG tablet Take 1 tablet (5 mg total) by mouth every evening. (Patient not taking: Reported on 12/29/2020) 90 tablet 3 Not Taking   lidocaine in nystatin suspension Swish & spit with 5 ml's 3 times a day for 5 days then 3 times a day as needed (Patient not taking: Reported on 12/29/2020) 200 mL 0 Not Taking   magic mouthwash w/lidocaine SOLN Swish and spit 36m PO TID x5 days, then PRN- mouth pain. Steroid, Benadryl, Nystatin, Lidocaine 1:1 ratio. (Patient not taking: Reported on 12/29/2020) 100 mL 0 Not Taking    Assessment: 89YOM with hypoxia requiring mechanical intubation. Pharmacy consulted to start IV heparin for Afib. On Eliquis PTA and last dose was yesterday morning. H/H elevated, Plt low. Scr wnl  APTT supratherpeutic at 113 on 1800 units/hr  Goal of Therapy:  Heparin level 0.3-0.7 units/ml aPTT 66-102 seconds Monitor platelets by anticoagulation protocol: Yes   Plan:  -Decrease heparin drip at 1700 units/hr  -F/u 6 hr HL/aPTT  -Monitor daily HL, CBC and s/s of bleeding   CAlanda Slim PharmD, FUrosurgical Center Of Richmond NorthClinical Pharmacist Please see AMION for all Pharmacists' Contact Phone Numbers 12/30/2020, 3:02 PM

## 2020-12-30 NOTE — Progress Notes (Signed)
[place] Progress Note Patient Name: BAIDEN LYNES DOB: [birth year] MRN: [id number]   Date of Service  12/30/2020  HPI/Events of Note  Phos < 1.0, Mg++ 1.7, K+ 4.2.  eICU Interventions  Sodium phosphate 30 mmol iv x 1, Magnesium sulfate 2 gm iv x 1.        Kerry Kass Pj Zehner 12/30/2020, 5:32 AM

## 2020-12-30 NOTE — Progress Notes (Signed)
ANTICOAGULATION CONSULT NOTE Pharmacy Consult for heparin Indication: atrial fibrillation  No Known Allergies  Patient Measurements: Height: '5\' 10"'$  (177.8 cm) Weight: (!) 145.8 kg (321 lb 6.4 oz) IBW/kg (Calculated) : 73 Heparin Dosing Weight: 109 kg   Vital Signs: Temp: 98.2 F (36.8 C) (09/12 2000) Temp Source: Axillary (09/12 2000) BP: 122/83 (09/13 0400) Pulse Rate: 95 (09/13 0400)  Labs: Recent Labs    12/29/20 0839 12/29/20 1045 12/29/20 1714 12/30/20 0247  HGB 19.0*  --  17.3* 16.7  HCT 58.8*  --  51.0 50.5  PLT 143*  --   --  155  APTT  --  24  --  58*  LABPROT  --  13.4  --   --   INR  --  1.0  --   --   HEPARINUNFRC  --   --   --  0.40  CREATININE 1.06  --   --  1.09  TROPONINIHS 14 14  --   --      Estimated Creatinine Clearance: 83.3 mL/min (by C-G formula based on SCr of 1.09 mg/dL).   Assessment: 75 y.o. male with h/o Afib, Eliquis on hold, for heparin.  aPTT subtherapeutic this morning  Goal of Therapy:  Heparin level 0.3-0.7 units/ml aPTT 66-102 seconds Monitor platelets by anticoagulation protocol: Yes   Plan:  Increase Heparin 1800 units/hr aPTT in 8 hours  Phillis Knack, PharmD, BCPS

## 2020-12-30 NOTE — Progress Notes (Signed)
Initial Nutrition Assessment  DOCUMENTATION CODES:   Not applicable  INTERVENTION:   Tube feeding via OG tube: - Start Vital High Protein @ 20 ml/hr and advance by 10 ml q 4 hours to goal rate of 60 ml/hr (1440 ml/day) - ProSource TF 45 ml TID  Tube feeding regimen at goal provides 1560 kcal, 159 grams of protein, and 1170 ml of H2O.   Tube feeding regimen at goal and current propofol provides 2252 total kcal (100% of needs).  NUTRITION DIAGNOSIS:   Inadequate oral intake related to inability to eat as evidenced by NPO status.  GOAL:   Patient will meet greater than or equal to 90% of their needs  MONITOR:   Vent status, Labs, Weight trends, TF tolerance, I & O's  REASON FOR ASSESSMENT:   Ventilator, Consult Enteral/tube feeding initiation and management  ASSESSMENT:   75 year old male who presented to the ED on 9/12 with SOB. PMH of HTN, HLD, atrial fibrillation, COPD, CHF, PMR on chronic steroids. Pt admitted with acute respiratory failure secondary to pneumonia. Pt required intubation.  Discussed pt with RN and during ICU rounds.  Consult received for enteral nutrition initiation and management. Pt with OG tube in stomach per abdominal x-ray from 9/12, currently to low intermittent suction.  Spoke with pt's wife at bedside. She reports that pt has a great appetite and eats well. Pt typically consumes 2 regular meals and 2 snacks daily. She does endorse pt with difficulty swallowing on occasion but that this does not happen every time he eats. Pt's wife believes that this started about 2-3 months ago. Pt's wife has noticed it happening mostly when pt is leaning back in a reclined position. Pt has been seen by ENT and an esophogram study was completed on 12/17/20 showing mild dysmotility, small hiatal hernia.  Pt's wife reports that pt's UBW is 296 lbs. She states that pt has been taking prednisone over the last 6 months and that his weight has increased to 322-326 lbs.  Pt's wife does not that pt has been experiencing bloating after eating for the last few days.  MD has ordered reglan. RD to start tube feeds at trickle rate and advance to goal q 4 hours.  Admit weight: 145.8 kg Current weight: 149.6 kg  Patient is currently intubated on ventilator support MV: 13.7 L/min Temp (24hrs), Avg:98.4 F (36.9 C), Min:97.6 F (36.4 C), Max:99.4 F (37.4 C)  Drips: Propofol: 26.2 ml/hr (provides 692 kcal daily from lipid) Levophed Heparin  Medications reviewed and include: calcium citrate daily, cholecalciferol, colace, SSI q 4 hours, IV reglan, IV protonix, K phos 500 mg TID, miralax, prednisone, IV abx  Labs reviewed: ionized calcium 1.08, phosphorus <1.0 CBG's: 118-202 x 24 hours  UOP: 800 ml x 24 hours I/O's: +4.6 L since admit  NUTRITION - FOCUSED PHYSICAL EXAM:  Flowsheet Row Most Recent Value  Orbital Region No depletion  Upper Arm Region No depletion  Thoracic and Lumbar Region No depletion  Buccal Region No depletion  Temple Region No depletion  Clavicle Bone Region No depletion  Clavicle and Acromion Bone Region No depletion  Scapular Bone Region No depletion  Dorsal Hand No depletion  Patellar Region No depletion  Anterior Thigh Region No depletion  Posterior Calf Region No depletion  Edema (RD Assessment) Mild  Hair Reviewed  Eyes Unable to assess  Mouth Unable to assess  Skin Reviewed  Nails Reviewed       Diet Order:   Diet Order  Diet NPO time specified  Diet effective now                   EDUCATION NEEDS:   Not appropriate for education at this time  Skin:  Skin Assessment: Reviewed RN Assessment  Last BM:  no documented BM  Height:   Ht Readings from Last 1 Encounters:  12/29/20 '5\' 10"'$  (1.778 m)    Weight:   Wt Readings from Last 1 Encounters:  12/30/20 (!) 149.6 kg    Ideal Body Weight:  75.5 kg  BMI:  Body mass index is 47.32 kg/m.  Estimated Nutritional Needs:   Kcal:   2100-2300  Protein:  150-165 grams  Fluid:  >/= 1.8 L/day    Gustavus Bryant, MS, RD, LDN Inpatient Clinical Dietitian Please see AMiON for contact information.

## 2020-12-30 NOTE — Progress Notes (Signed)
SLP Cancellation Note  Patient Details Name: Garrett Munoz MRN: [id number] DOB: 1946/01/28   Cancelled treatment:       Reason Eval/Treat Not Completed: Patient not medically ready;Medical issues which prohibited therapy  Unable to complete clinical or instrumental swallow assessment at this time, as pt is currently orally intubated. Will continue efforts.   Nautika Cressey B. Quentin Ore, [facility], Home Speech Language Pathologist Office: [phone]  Shonna Chock 12/30/2020, 9:11 AM

## 2020-12-30 NOTE — Progress Notes (Signed)
ANTICOAGULATION CONSULT NOTE   Pharmacy Consult for heparin Indication: atrial fibrillation  No Known Allergies  Patient Measurements: Height: '5\' 10"'$  (177.8 cm) Weight: (!) 149.6 kg (329 lb 12.9 oz) IBW/kg (Calculated) : 73 Heparin Dosing Weight: 109 kg   Vital Signs: Temp: 98.5 F (36.9 C) (09/13 2325) Temp Source: Oral (09/13 2325) BP: 118/84 (09/13 2000) Pulse Rate: 61 (09/13 2000)  Labs: Recent Labs    12/29/20 0839 12/29/20 1045 12/29/20 1714 12/30/20 0247 12/30/20 1200 12/30/20 1500 12/30/20 2200  HGB 19.0*  --  17.3* 16.7  --   --   --   HCT 58.8*  --  51.0 50.5  --   --   --   PLT 143*  --   --  155  --   --   --   APTT  --  24  --  58* 113*  --   --   LABPROT  --  13.4  --   --   --   --   --   INR  --  1.0  --   --   --   --   --   HEPARINUNFRC  --   --   --  0.40  --   --  0.62  CREATININE 1.06  --   --  1.09  --  1.07  --   TROPONINIHS 14 14  --   --   --   --   --      Estimated Creatinine Clearance: 86.1 mL/min (by C-G formula based on SCr of 1.07 mg/dL).   Medical History: Past Medical History:  Diagnosis Date   Atrial fibrillation ([place])    Bronchitis    hx   Cataract    right eye   CHF NYHA class III, acute on chronic, diastolic (HCC)    COPD (chronic obstructive pulmonary disease) (HCC)    DDD (degenerative disc disease), lumbosacral    Dysrhythmia    irregular heatbeat   Eye worm    right eye   Gout    History of kidney stones    Hypertension    Left knee DJD    Morbid obesity with BMI of 40.0-44.9, adult (HCC)    Pneumonia    hx   Prediabetes    Radicular syndrome of left leg    S/P total knee replacement    right   Sleep apnea    can't use sleep study 10 yrs ago   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    Assessment: 34 YOM with hypoxia requiring mechanical intubation. Pharmacy consulted to start IV heparin for Afib. On Eliquis PTA and last dose was 9/11. H/H elevated, Plt low. Scr wnl  Heparin level at goal on 1700 units/hr. No  bleeding issues noted.   Goal of Therapy:  Heparin level 0.3-0.7 units/ml aPTT 66-102 seconds Monitor platelets by anticoagulation protocol: Yes   Plan:  -Continue heparin drip at 1700 units/hr  -Monitor daily HL, CBC and s/s of bleeding   Erin Hearing PharmD., BCPS Clinical Pharmacist 12/30/2020 11:32 PM

## 2020-12-31 DIAGNOSIS — J9602 Acute respiratory failure with hypercapnia: Secondary | ICD-10-CM | POA: Diagnosis not present

## 2020-12-31 DIAGNOSIS — J9601 Acute respiratory failure with hypoxia: Secondary | ICD-10-CM | POA: Diagnosis not present

## 2020-12-31 LAB — BASIC METABOLIC PANEL
Anion gap: 8 (ref 5–15)
Anion gap: 8 (ref 5–15)
BUN: 19 mg/dL (ref 8–23)
BUN: 18 mg/dL (ref 8–23)
CO2: 32 mmol/L (ref 22–32)
CO2: 32 mmol/L (ref 22–32)
Calcium: 8.1 mg/dL — ABNORMAL LOW (ref 8.9–10.3)
Calcium: 8 mg/dL — ABNORMAL LOW (ref 8.9–10.3)
Chloride: 98 mmol/L (ref 98–111)
Chloride: 100 mmol/L (ref 98–111)
Creatinine, Ser: 1.02 mg/dL (ref 0.61–1.24)
Creatinine, Ser: 0.88 mg/dL (ref 0.61–1.24)
GFR, Estimated: >60 mL/min (ref >60)
GFR, Estimated: >60 mL/min (ref >60)
Glucose, Bld: 116 mg/dL — ABNORMAL HIGH (ref 70–99)
Glucose, Bld: 103 mg/dL — ABNORMAL HIGH (ref 70–99)
Potassium: 2.5 mmol/L — CL (ref 3.5–5.1)
Potassium: 3.4 mmol/L — ABNORMAL LOW (ref 3.5–5.1)
Sodium: 138 mmol/L (ref 135–145)
Sodium: 140 mmol/L (ref 135–145)

## 2020-12-31 LAB — BODY FLUID CELL COUNT WITH DIFFERENTIAL
Eos, Fluid: 6 %
Lymphs, Fluid: 11 %
Monocyte-Macrophage-Serous Fluid: 4 % — ABNORMAL LOW (ref 50–90)
Neutrophil Count, Fluid: 79 % — ABNORMAL HIGH (ref 0–25)
Total Nucleated Cell Count, Fluid: 208 cu mm (ref 0–1000)

## 2020-12-31 LAB — GLUCOSE, CAPILLARY
Glucose-Capillary: 139 mg/dL — ABNORMAL HIGH (ref 70–99)
Glucose-Capillary: 110 mg/dL — ABNORMAL HIGH (ref 70–99)
Glucose-Capillary: 108 mg/dL — ABNORMAL HIGH (ref 70–99)
Glucose-Capillary: 105 mg/dL — ABNORMAL HIGH (ref 70–99)
Glucose-Capillary: 174 mg/dL — ABNORMAL HIGH (ref 70–99)
Glucose-Capillary: 72 mg/dL (ref 70–99)

## 2020-12-31 LAB — CBC
HCT: 46.8 % (ref 39.0–52.0)
Hemoglobin: 15.7 g/dL (ref 13.0–17.0)
MCH: 32 pg (ref 26.0–34.0)
MCHC: 33.5 g/dL (ref 30.0–36.0)
MCV: 95.3 fL (ref 80.0–100.0)
Platelets: 123 10*3/uL — ABNORMAL LOW (ref 150–400)
RBC: 4.91 MIL/uL (ref 4.22–5.81)
RDW: 14.5 % (ref 11.5–15.5)
WBC: 13.7 10*3/uL — ABNORMAL HIGH (ref 4.0–10.5)
nRBC: 0 % (ref 0.0–0.2)

## 2020-12-31 LAB — MAGNESIUM: Magnesium: 2 mg/dL (ref 1.7–2.4)

## 2020-12-31 LAB — HEPARIN LEVEL (UNFRACTIONATED): Heparin Unfractionated: 0.54 [IU]/mL (ref 0.30–0.70)

## 2020-12-31 LAB — APTT: aPTT: 90 s — ABNORMAL HIGH (ref 24–36)

## 2020-12-31 MED ORDER — FOOD THICKENER (SIMPLYTHICK)
1.0000 | ORAL | Status: DC | PRN
  Filled 2020-12-31: qty 1

## 2020-12-31 MED ORDER — MAGIC MOUTHWASH W/LIDOCAINE
5.0000 mL | Freq: Three times a day (TID) | ORAL | Status: DC | PRN
  Administered 2021-01-01 – 2021-01-08 (×3): 5 mL via ORAL
  Filled 2020-12-31 (×6): qty 5

## 2020-12-31 MED ORDER — POTASSIUM CHLORIDE 20 MEQ PO PACK
40.0000 meq | PACK | Freq: Once | ORAL | Status: AC
  Administered 2020-12-31: 40 meq via ORAL
  Filled 2020-12-31: qty 2

## 2020-12-31 MED ORDER — DOCUSATE SODIUM 50 MG/5ML PO LIQD
100.0000 mg | Freq: Two times a day (BID) | ORAL | Status: DC
  Filled 2020-12-31 (×3): qty 10

## 2020-12-31 MED ORDER — DOCUSATE SODIUM 100 MG PO CAPS: 100.0000 mg | ORAL_CAPSULE | Freq: Two times a day (BID) | ORAL | Status: DC | PRN

## 2020-12-31 MED ORDER — POTASSIUM CHLORIDE 10 MEQ/50ML IV SOLN
10.0000 meq | INTRAVENOUS | Status: AC
  Administered 2020-12-31 (×4): 10 meq via INTRAVENOUS
  Filled 2020-12-31 (×4): qty 50

## 2020-12-31 MED ORDER — VITAMIN D 25 MCG (1000 UNIT) PO TABS
500.0000 [IU] | ORAL_TABLET | Freq: Every day | ORAL | Status: DC
  Administered 2021-01-01 – 2021-01-08 (×8): 500 [IU] via ORAL
  Filled 2020-12-31 (×9): qty 1

## 2020-12-31 MED ORDER — POLYETHYLENE GLYCOL 3350 17 G PO PACK
17.0000 g | PACK | Freq: Every day | ORAL | Status: DC
  Filled 2020-12-31 (×3): qty 1

## 2020-12-31 MED ORDER — PREDNISONE 10 MG PO TABS
10.0000 mg | ORAL_TABLET | Freq: Every day | ORAL | Status: DC
  Administered 2021-01-01 – 2021-01-08 (×8): 10 mg via ORAL
  Filled 2020-12-31 (×8): qty 1

## 2020-12-31 MED ORDER — K PHOS MONO-SOD PHOS DI & MONO 155-852-130 MG PO TABS
500.0000 mg | ORAL_TABLET | Freq: Three times a day (TID) | ORAL | Status: AC
  Administered 2020-12-31 (×2): 500 mg via ORAL
  Filled 2020-12-31 (×2): qty 2

## 2020-12-31 MED ORDER — POLYETHYLENE GLYCOL 3350 17 G PO PACK: 17.0000 g | PACK | Freq: Every day | ORAL | Status: DC | PRN

## 2020-12-31 MED ORDER — DEXMEDETOMIDINE HCL IN NACL 400 MCG/100ML IV SOLN
0.4000 ug/kg/h | INTRAVENOUS | Status: DC
  Administered 2020-12-31: 0.4 ug/kg/h via INTRAVENOUS
  Filled 2020-12-31: qty 100

## 2020-12-31 MED ORDER — POTASSIUM CHLORIDE 20 MEQ PO PACK
40.0000 meq | PACK | Freq: Once | ORAL | Status: AC
  Administered 2020-12-31: 40 meq
  Filled 2020-12-31: qty 2

## 2020-12-31 MED ORDER — HYDROCODONE-ACETAMINOPHEN 5-325 MG PO TABS: 1.0000 | ORAL_TABLET | Freq: Four times a day (QID) | ORAL | Status: DC | PRN

## 2020-12-31 MED ORDER — GUAIFENESIN 200 MG PO TABS
300.0000 mg | ORAL_TABLET | Freq: Four times a day (QID) | ORAL | Status: DC
  Administered 2020-12-31 – 2021-01-08 (×30): 300 mg via ORAL
  Filled 2020-12-31 (×2): qty 1.5
  Filled 2020-12-31 (×5): qty 2
  Filled 2020-12-31 (×5): qty 1.5
  Filled 2020-12-31 (×4): qty 2
  Filled 2020-12-31 (×2): qty 1.5
  Filled 2020-12-31: qty 2
  Filled 2020-12-31: qty 1.5
  Filled 2020-12-31 (×2): qty 2
  Filled 2020-12-31 (×2): qty 1.5
  Filled 2020-12-31 (×3): qty 2
  Filled 2020-12-31: qty 1.5
  Filled 2020-12-31 (×2): qty 2
  Filled 2020-12-31 (×3): qty 1.5
  Filled 2020-12-31: qty 2
  Filled 2020-12-31: qty 1.5
  Filled 2020-12-31: qty 2

## 2020-12-31 NOTE — Procedures (Signed)
Extubation Procedure Note  Patient Details:   Name: Garrett Munoz DOB: [birth year] MRN: [id number]   Airway Documentation:    Vent end date: 12/31/20 Vent end time: 0940   Evaluation  O2 sats: stable throughout Complications: No apparent complications Patient did tolerate procedure well. Bilateral Breath Sounds: Diminished, Clear   Yes  Patient extubated per MD order. Positive cuff leak. No stridor noted. Vitals are stable on 4L [place]. RN at bedside.  Garrett Munoz 12/31/2020, 9:43 AM

## 2020-12-31 NOTE — Progress Notes (Signed)
NAME:  Garrett Munoz, MRN:  [id number], DOB:  1946/01/06, LOS: 2 ADMISSION DATE:  12/29/2020, CONSULTATION DATE:  12/29/20 REFERRING MD:  Tamala Julian CHIEF COMPLAINT:  acute hypercarbic respiratory failure    History of Present Illness:  75 yo male with known COPD, pAFib, diastolic heart failure admitted with acute hypercarbic respiratory failure secondary to pneumonia.   Patient follows with Dr [name] for pulmonary outpatient for upper airway cough syndrome and dyspnea on exertion since Nov 2021. Patient presented to ED today for complaints of shortness of breath.  Vital signs on presentation: Temp 100.6, HR 109, RR 22, SpO2 88% on 6 L [place].  He was increased to 10 L [place] with improvement in oxygenation.  Imaging with new right lower lobe consolidation. Patient was started on antibiotics with Azithromycin and Rocephin.  Patient was later found to be unresponsive and ABG revealed hypercarbic respiratory failure with ABG: 7/119/91/34. He required intubation and mechanical ventilation. Work up for infection is in progress. Antibiotics have been broadened to Cefepime.    At time of eval patient remains somnolent saturating in low 90s on BIPAP, wife at bedside, states he has not felt well for past 10 months.  Pertinent  Medical History  Afib COPD Hypertension Pre-diabetes OSA CHF, NYHA class III  Significant Hospital Events: Including procedures, antibiotic start and stop dates in addition to other pertinent events   9/12 Admit to [facility], intubation, central line  Interim History / Subjective:   No acute overnight events. Still requiring low dose pressor support. Agitated this AM, trying to pull at ETT.   Objective   Blood pressure 114/80, pulse 60, temperature 98.5 F (36.9 C), temperature source Oral, resp. rate (!) 24, height _0  (1.778 m), weight (!) 149.5 kg, SpO2 95 %.    Vent Mode: PRVC FiO2 (%):  [40 %-50 %] 40 % Set Rate:  [24 bmp] 24 bmp Vt Set:  [580 mL] 580 mL PEEP:  [10  cmH20] 10 cmH20 Plateau Pressure:  [21 cmH20-24 cmH20] 22 cmH20   Intake/Output Summary (Last 24 hours) at 12/31/2020 7829 Last data filed at 12/31/2020 0700 Gross per 24 hour  Intake 2457.45 ml  Output 2150 ml  Net 307.45 ml    Filed Weights   12/29/20 1152 12/30/20 0714 12/31/20 0346  Weight: (!) 145.8 kg (!) 149.6 kg (!) 149.5 kg   Examination: General: Critically ill appearing male.  HENT: Mucus membranes moist.  Lungs: Intubated. Clear to auscultation bilaterally. No accessory muscle use.  Cardiovascular: Irregularly irregular. No tachycardia. No murmurs,rubs or gallops.  Abdomen: Soft, nontender. Hypoactive bowel sounds.  Extremities: 1+ pitting edema Neuro: Sedated, moving all extremities spontaneously.   Resolved Hospital Problem list   N/A  Assessment & Plan:   Septic Shock  Secondary to pneumonia. Pressor needs are improving.   - Continue Levophed to maintain MAP > 65. Wean as tolerated.   Aspiration Pneumonia  Acute Hypoxic and Hypercarbic Respiratory Failure  Suspected aspiration pneumonia in the setting of chronic dysphagia. Seen by ENT previously with normal barium swallow. BAL cultures are negative for abnormal flora.   - Continue full vent support  - Daily SBTs - VAP measures in place - Consider extubation once sedation is weaned.  - Propofol and Fentanyl for sedation - Continue Azithromycin and Ceftriaxone, Day 3 - SLP evaluation when sedation is weaned  HFpEF  Echo obtained on 9/13 with unchanged results. EF of 55-50% with mildly reduced right ventricular systolic function.   - Repeat Lasix this  afternoon once potassium repleted.    A. Fib  - Holding home Coreg due to hypotension - Heparin gtt for stroke prevention   Pre-diabetes A1c of 5.8%. Hyperglycemic in the setting of critical illness  - SSI  Polymyalgia Rheumatica  - s/p stress dose steroids on 9/12 - Continue home Prednisone   Hypokalemia  Hypophosphatemia  - Replace PRN -  Monitor daily   Best Practice (right click and "Reselect all SmartList Selections" daily)   Diet/type: NPO DVT prophylaxis: systemic heparin GI prophylaxis: PPI Lines: Central line and yes and it is still needed Foley:  Yes, and it is still needed Code Status:  full code Last date of multidisciplinary goals of care discussion [Family updated at bedside on 9/13]  Labs   CBC: Recent Labs  Lab 12/29/20 0839 12/29/20 1714 12/30/20 0247 12/31/20 0419  WBC 13.9*  --  20.4* 13.7*  NEUTROABS 11.7*  --   --   --   HGB 19.0* 17.3* 16.7 15.7  HCT 58.8* 51.0 50.5 46.8  MCV 100.9*  --  96.6 95.3  PLT 143*  --  155 123*     Basic Metabolic Panel: Recent Labs  Lab 12/29/20 0839 12/29/20 1714 12/30/20 0247 12/30/20 1500 12/31/20 0419  NA 140 133* 137 138 138  K 4.4 4.3 4.2 3.0* 2.5*  CL 96*  --  96* 96* 98  CO2 36*  --  32 33* 32  GLUCOSE 88  --  119* 122* 116*  BUN 12  --  _0 CREATININE 1.06  --  1.09 1.07 1.02  CALCIUM 9.3  --  8.5* 7.9* 8.1*  MG 1.8  --  1.7  --  2.0  PHOS  --   --  <1.0* 3.2  --     GFR: Estimated Creatinine Clearance: 90.3 mL/min (by C-G formula based on SCr of 1.02 mg/dL). Recent Labs  Lab 12/29/20 0839 12/29/20 1041 12/29/20 1252 12/30/20 0247 12/31/20 0419  PROCALCITON  --   --  <0.10  --   --   WBC 13.9*  --   --  20.4* 13.7*  LATICACIDVEN 1.7 1.3  --   --   --      Liver Function Tests: Recent Labs  Lab 12/29/20 0839  AST 20  ALT 29  ALKPHOS 60  BILITOT 0.9  PROT 7.2  ALBUMIN 3.8    No results for input(s): LIPASE, AMYLASE in the last 168 hours. No results for input(s): AMMONIA in the last 168 hours.  ABG    Component Value Date/Time   PHART 7.376 12/29/2020 1714   PCO2ART 54.7 (H) 12/29/2020 1714   PO2ART 123 (H) 12/29/2020 1714   HCO3 32.0 (H) 12/29/2020 1714   TCO2 34 (H) 12/29/2020 1714   O2SAT 99.0 12/29/2020 1714      Coagulation Profile: Recent Labs  Lab 12/29/20 1045  INR 1.0     Cardiac  Enzymes: No results for input(s): CKTOTAL, CKMB, CKMBINDEX, TROPONINI in the last 168 hours.  HbA1C: Hgb A1c MFr Bld  Date/Time Value Ref Range Status  12/30/2020 02:47 AM 5.8 (H) 4.8 - 5.6 % Final    Comment:    (NOTE) Pre diabetes:          5.7%-6.4%  Diabetes:              >6.4%  Glycemic control for   <7.0% adults with diabetes   03/10/2020 04:26 AM 5.8 (H) 4.8 - 5.6 % Final    Comment:    (  NOTE) Pre diabetes:          5.7%-6.4%  Diabetes:              >6.4%  Glycemic control for   <7.0% adults with diabetes    CBG: Recent Labs  Lab 12/30/20 1118 12/30/20 1519 12/30/20 1933 12/30/20 2324 12/31/20 0341  GLUCAP 129* 146* 118* 140* 139*    Review of Systems:   Negative except as noted above.   Past Medical History:  He,  has a past medical history of Atrial fibrillation ([place]), Bronchitis, Cataract, CHF NYHA class III, acute on chronic, diastolic (HCC), COPD (chronic obstructive pulmonary disease) ([place]), DDD (degenerative disc disease), lumbosacral, Dysrhythmia, Eye worm, Gout, History of kidney stones, Hypertension, Left knee DJD, Morbid obesity with BMI of 40.0-44.9, adult ([place]), Pneumonia, Prediabetes, Radicular syndrome of left leg, S/P total knee replacement, Sleep apnea, and 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.   Surgical History:   Past Surgical History:  Procedure Laterality Date   CARDIOVERSION N/A 03/04/2015   Procedure: CARDIOVERSION;  Surgeon: [name], MD;  Location: [facility];  Service: Cardiovascular;  Laterality: N/A;   CHOLECYSTECTOMY     COLONOSCOPY N/A 01/01/2016   Procedure: COLONOSCOPY;  Surgeon: [name], MD;  [facility];  Service: Endoscopy;  Laterality: N/A;  1:45   COLONOSCOPY N/A 01/14/2017   Procedure: COLONOSCOPY;  Surgeon: [name], MD;  [facility];  Service: Endoscopy;  Laterality: N/A;  12:05   EYE EXAMINATION UNDER ANESTHESIA W/ RETINAL CRYOTHERAPY AND RETINAL LASER  07/2011   EYE SURGERY   02/2011   cat rt   KNEE ARTHROSCOPY     bilateral   LUMBAR LAMINECTOMY/DECOMPRESSION MICRODISCECTOMY N/A 08/18/2017   Procedure: Bilateral microlumbar decompression Lumbar four-Lumbar five;  Surgeon: [name], MD;  Location: [facility];  Service: Orthopedics;  Laterality: N/A;   POLYPECTOMY  01/01/2016   Procedure: POLYPECTOMY;  Surgeon: [name], MD;  [facility];  Service: Endoscopy;;  colon   RETINAL DETACHMENT SURGERY  03/2011   rt   TOTAL KNEE ARTHROPLASTY  2003   rt   TOTAL KNEE ARTHROPLASTY  02/07/2012   Procedure: TOTAL KNEE ARTHROPLASTY;  Surgeon: [name], MD;  Location: [place];  Service: Orthopedics;  Laterality: Left;     Social History:   reports that he quit smoking about 28 years ago. His smoking use included cigarettes. He has a 30.00 pack-year smoking history. He has never used smokeless tobacco. He reports current alcohol use. He reports that he does not use drugs.   Family History:  His family history includes Lung cancer in his father.   Allergies No Known Allergies   Home Medications  Prior to Admission medications   Medication Sig Start Date End Date Taking? Authorizing Provider  albuterol (ACCUNEB) 1.25 MG/3ML nebulizer solution Take 1 ampule by nebulization every 4 (four) hours as needed for wheezing.   Yes [provider]  albuterol (VENTOLIN HFA) 108 (90 Base) MCG/ACT inhaler Inhale 2 puffs into the lungs every 6 (six) hours as needed for wheezing or shortness of breath. 07/29/20  Yes [name], MD  apixaban (ELIQUIS) 5 MG TABS tablet TAKE 1 TABLET (5 MG TOTAL) BY MOUTH 2 (TWO) TIMES DAILY. Patient taking differently: Take 5 mg by mouth 2 (two) times daily. 07/01/20 07/01/21 Yes [name], MD  azelastine (ASTELIN) 0.1 % nasal spray Place 1 spray into both nostrils daily. Use in each nostril as directed Patient  taking differently: Place 1 spray into both nostrils daily as needed for rhinitis. 12/26/20  Yes Valentina Shaggy, MD  carvedilol (COREG) 12.5 MG tablet TAKE 1 & 1/2 TABLETS BY MOUTH EVERY MORNING AND 1 TABLET IN THE EVENING Patient taking differently: Take 6.25-12.5 mg by mouth See admin instructions. Take half tablet by mouth in the morning and then take 1 whole tablet by mouth in the evening per wife 04/03/20 04/03/21 Yes [name], MD  fluticasone (FLONASE) 50 MCG/ACT nasal spray SHAKE LIQUID AND USE 2 SPRAYS IN EACH NOSTRIL DAILY Patient taking differently: Place 2 sprays into both nostrils daily as needed for allergies or rhinitis. 04/23/19  Yes [name], MD  furosemide (LASIX) 20 MG tablet Take 1 tablet (20 mg total) by mouth daily. 12/04/20  Yes [name], MD  gabapentin (NEURONTIN) 300 MG capsule TAKE 2 CAPSULES (600 MG TOTAL) BY MOUTH 3 (THREE) TIMES DAILY. Patient taking differently: Take 600 mg by mouth 3 (three) times daily. 04/25/20 04/25/21 Yes PickardCammie Mcgee, MD  losartan (COZAAR) 25 MG tablet TAKE 1 TABLET (25 MG TOTAL) BY MOUTH DAILY. Patient taking differently: Take 25 mg by mouth daily. 06/26/20 06/26/21 Yes PickardCammie Mcgee, MD  montelukast (SINGULAIR) 10 MG tablet Take 1 tablet (10 mg total) by mouth at bedtime. 09/29/20  Yes [name], MD  Multiple Vitamins-Minerals (MULTIVITAMIN WITH MINERALS) tablet Take 1 tablet by mouth daily. 03/12/20 03/12/21 Yes Dwyane Dee, MD  predniSONE (DELTASONE) 10 MG tablet Take 1 tablet (10 mg total) by mouth daily with breakfast. 09/29/20  Yes Rice, Resa Miner, MD  temazepam (RESTORIL) 30 MG capsule Take 1 capsule (30 mg total) by mouth at bedtime as needed for sleep Patient taking differently: Take 30 mg by mouth at bedtime. 09/30/20  Yes [name], MD  allopurinol (ZYLOPRIM) 300 MG tablet TAKE 1 TABLET (300 MG TOTAL) BY MOUTH DAILY. Patient not taking: Reported on 12/29/2020 12/31/19 12/30/20  [name], MD  levocetirizine (XYZAL) 5 MG tablet Take 1 tablet (5 mg total) by mouth every evening. Patient  not taking: Reported on 12/29/2020 09/29/20   [name], MD  lidocaine in nystatin suspension Swish & spit with 5 ml's 3 times a day for 5 days then 3 times a day as needed Patient not taking: Reported on 12/29/2020 12/16/20   [name], MD  magic mouthwash w/lidocaine SOLN Swish and spit 2m PO TID x5 days, then PRN- mouth pain. Steroid, Benadryl, Nystatin, Lidocaine 1:1 ratio. Patient not taking: Reported on 12/29/2020 12/16/20   PSusy Frizzle MD  potassium chloride SA (KLOR-CON) 20 MEQ tablet Take 1 tablet (20 mEq total) by mouth daily. Patient not taking: No sig reported 03/26/20 05/26/20  PSusy Frizzle MD   Dr. IJose PersiaInternal Medicine PGY-3 12/31/2020, 7:22 AM

## 2020-12-31 NOTE — Progress Notes (Signed)
[place] for heparin Indication: atrial fibrillation  No Known Allergies  Patient Measurements: Height: '5\' 10"'$  (177.8 cm) Weight: (!) 149.5 kg (329 lb 9.4 oz) IBW/kg (Calculated) : 73 Heparin Dosing Weight: 109 kg   Vital Signs: Temp: 98.5 F (36.9 C) (09/13 2325) Temp Source: Oral (09/13 2325) BP: 103/65 (09/14 0730) Pulse Rate: 73 (09/14 0730)  Labs: Recent Labs    12/29/20 0839 12/29/20 0839 12/29/20 1045 12/29/20 1714 12/30/20 0247 12/30/20 1200 12/30/20 1500 12/30/20 2200 12/31/20 0419  HGB 19.0*  --   --  17.3* 16.7  --   --   --  15.7  HCT 58.8*  --   --  51.0 50.5  --   --   --  46.8  PLT 143*  --   --   --  155  --   --   --  123*  APTT  --    < > 24  --  58* 113*  --  90*  --   LABPROT  --   --  13.4  --   --   --   --   --   --   INR  --   --  1.0  --   --   --   --   --   --   HEPARINUNFRC  --   --   --   --  0.40  --   --  0.62 0.54  CREATININE 1.06  --   --   --  1.09  --  1.07  --  1.02  TROPONINIHS 14  --  14  --   --   --   --   --   --    < > = values in this interval not displayed.    Estimated Creatinine Clearance: 90.3 mL/min (by C-G formula based on SCr of 1.02 mg/dL).  Assessment: 64 YOM with hypoxia requiring mechanical intubation. Pharmacy consulted to start IV heparin for Afib. On Eliquis PTA and last dose was 9/11.   Heparin level remains therapeutic for goal on 1700 units/hr.  H/H is trending down to normal limits (significantly elevated on admit) Platelets slight trend down - will monitor closely. No bleeding issues noted.  SCr remains stable.   Goal of Therapy:  Heparin level 0.3-0.7 units/ml Monitor platelets by anticoagulation protocol: Yes   Plan:  -Continue heparin drip at 1700 units/hr  -Monitor daily HL, CBC and s/s of bleeding  *Watch Platelets closely  Sloan Leiter, PharmD, BCPS, BCCCP Clinical Pharmacist Please refer to [facility] for [place] numbers 12/31/2020 8:12 AM

## 2020-12-31 NOTE — Evaluation (Signed)
Occupational Therapy Evaluation Patient Details Name: Garrett Munoz MRN: [id number] DOB: 1946/03/24 Today's Date: 12/31/2020   History of Present Illness 75 yo male with known COPD, pAFib, diastolic heart failure admitted with acute hypercarbic respiratory failure secondary to pneumonia.  Patient extubated 9/14 and on [place] for O2 support.   Clinical Impression   Patient admitted for the diagnosis above.  PTA he lives with his wife, who is able to provide supportive assist as needed, but cannot physically assist him for mobility.  Patient remarks that he was able to walk without an AD, still drives and mows his lawn, and had occasional assist from his wife with shoes and socks.  Deficits impacting independence are listed below.  Currently he is needing up to Min A for basic transfers without a RW, and Min A for lower body ADL with increased time and effort.  Patient wants to return home without [facility] services, but is agreeable to [facility] PT/OT if needed, and his wife agrees.       Recommendations for follow up therapy are one component of a multi-disciplinary discharge planning process, led by the attending physician.  Recommendations may be updated based on patient status, additional functional criteria and insurance authorization.   Follow Up Recommendations  No OT follow up    Equipment Recommendations  None recommended by OT    Recommendations for Other Services       Precautions / Restrictions Precautions Precautions: Fall Precaution Comments: watch O2 and BP. Restrictions Weight Bearing Restrictions: No      Mobility Bed Mobility Overal bed mobility: Needs Assistance Bed Mobility: Supine to Sit     Supine to sit: Supervision;HOB elevated       Patient Response: Cooperative  Transfers Overall transfer level: Needs assistance   Transfers: Sit to/from Stand;Stand Pivot Transfers Sit to Stand: Min assist Stand pivot transfers: Min assist       General transfer comment:  lines and leads assist.    Balance Overall balance assessment: Needs assistance Sitting-balance support: No upper extremity supported;Feet supported Sitting balance-Leahy Scale: Good     Standing balance support: No upper extremity supported Standing balance-Leahy Scale: Fair                             ADL either performed or assessed with clinical judgement   ADL Overall ADL's : Needs assistance/impaired Eating/Feeding: Independent;Sitting   Grooming: Wash/dry hands;Wash/dry face;Set up;Sitting   Upper Body Bathing: Min guard;Sitting   Lower Body Bathing: Minimal assistance;Sitting/lateral leans   Upper Body Dressing : Min guard;Sitting   Lower Body Dressing: Moderate assistance;Sit to/from stand   Toilet Transfer: Min guard;BSC   Toileting- Water quality scientist and Hygiene: Minimal assistance;Sit to/from stand       Functional mobility during ADLs: Minimal assistance       Vision Patient Visual Report: No change from baseline       Perception     Praxis      Pertinent Vitals/Pain Pain Assessment: No/denies pain Faces Pain Scale: No hurt     Hand Dominance Right   Extremity/Trunk Assessment Upper Extremity Assessment Upper Extremity Assessment: Overall WFL for tasks assessed   Lower Extremity Assessment Lower Extremity Assessment: Defer to PT evaluation   Cervical / Trunk Assessment Cervical / Trunk Assessment: Other exceptions Cervical / Trunk Exceptions: body habitus   Communication Communication Communication: No difficulties   Cognition Arousal/Alertness: Awake/alert Behavior During Therapy: WFL for tasks assessed/performed Overall Cognitive Status:  Within Functional Limits for tasks assessed                                     General Comments   Patient is a mouth breather, did desat to 90%    Exercises     Shoulder Instructions      Home Living Family/patient expects to be discharged to:: Private  residence Living Arrangements: Spouse/significant other Available Help at Discharge: Family;Available 24 hours/day Type of Home: House Home Access: Stairs to enter [employer] of Steps: 3 Entrance Stairs-Rails: None Home Layout: One level     Bathroom Shower/Tub: Other (comment) (walk in tub with shower.)   Bathroom Toilet: Standard Bathroom Accessibility: Yes How Accessible: Accessible via walker Home Equipment: [place] - 2 wheels;Walker - 4 wheels;Cane - single point;Shower seat;Grab bars - toilet;Grab bars - tub/shower;Wheelchair - manual          Prior Functioning/Environment Level of Independence: Independent                 OT Problem List: Decreased strength;Decreased activity tolerance;Impaired balance (sitting and/or standing);Obesity      OT Treatment/Interventions: Self-care/ADL training;Therapeutic exercise;Therapeutic activities;Balance training    OT Goals(Current goals can be found in the care plan section) Acute Rehab OT Goals Patient Stated Goal: I'm going home OT Goal Formulation: With patient Time For Goal Achievement: 01/14/21 Potential to Achieve Goals: Good  OT Frequency: Min 2X/week   Barriers to D/C:    none noted       Co-evaluation              AM-PAC OT "6 Clicks" Daily Activity     Outcome Measure Help from another person eating meals?: None Help from another person taking care of personal grooming?: None Help from another person toileting, which includes using toliet, bedpan, or urinal?: A Little Help from another person bathing (including washing, rinsing, drying)?: A Little Help from another person to put on and taking off regular upper body clothing?: None Help from another person to put on and taking off regular lower body clothing?: A Little 6 Click Score: 21   End of Session Equipment Utilized During Treatment: Oxygen Nurse Communication: Mobility status  Activity Tolerance: Patient tolerated treatment  well Patient left: in chair;with call bell/phone within reach  OT Visit Diagnosis: Unsteadiness on feet (R26.81)                Time: 1541-1600 OT Time Calculation (min): 19 min Charges:  OT General Charges $OT Visit: 1 Visit OT Evaluation $OT Eval Moderate Complexity: 1 Mod  12/31/2020  RP, OTR/L  Acute Rehabilitation Services  Office:  [phone]   Metta Clines 12/31/2020, 4:18 PM

## 2020-12-31 NOTE — Evaluation (Signed)
Clinical/Bedside Swallow Evaluation Patient Details  Name: Garrett Munoz MRN: [id number] Date of Birth: 1945/12/13  Today's Date: 12/31/2020 Time: SLP Start Time (ACUTE ONLY): [id number] SLP Stop Time (ACUTE ONLY): [id number] SLP Time Calculation (min) (ACUTE ONLY): 23 min  Past Medical History:  Past Medical History:  Diagnosis Date   Atrial fibrillation ([name])    Bronchitis    hx   Cataract    right eye   CHF NYHA class III, acute on chronic, diastolic (HCC)    COPD (chronic obstructive pulmonary disease) (HCC)    DDD (degenerative disc disease), lumbosacral    Dysrhythmia    irregular heatbeat   Eye worm    right eye   Gout    History of kidney stones    Hypertension    Left knee DJD    Morbid obesity with BMI of 40.0-44.9, adult ([name])    Pneumonia    hx   Prediabetes    Radicular syndrome of left leg    S/P total knee replacement    right   Sleep apnea    can't use sleep study 10 yrs ago   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    Past Surgical History:  Past Surgical History:  Procedure Laterality Date   CARDIOVERSION N/A 03/04/2015   Procedure: CARDIOVERSION;  Surgeon: [name], MD;  Location: [place];  Service: Cardiovascular;  Laterality: N/A;   CHOLECYSTECTOMY     COLONOSCOPY N/A 01/01/2016   Procedure: COLONOSCOPY;  Surgeon: [name], MD;  [facility];  Service: Endoscopy;  Laterality: N/A;  1:45   COLONOSCOPY N/A 01/14/2017   Procedure: COLONOSCOPY;  Surgeon: [name], MD;  [facility];  Service: Endoscopy;  Laterality: N/A;  12:05   EYE EXAMINATION UNDER ANESTHESIA W/ RETINAL CRYOTHERAPY AND RETINAL LASER  07/2011   EYE SURGERY  02/2011   cat rt   KNEE ARTHROSCOPY     bilateral   LUMBAR LAMINECTOMY/DECOMPRESSION MICRODISCECTOMY N/A 08/18/2017   Procedure: Bilateral microlumbar decompression Lumbar four-Lumbar five;  Surgeon: [name], MD;  Location: [place];  Service: Orthopedics;  Laterality: N/A;   POLYPECTOMY  01/01/2016    Procedure: POLYPECTOMY;  Surgeon: [name], MD;  [facility];  Service: Endoscopy;;  colon   RETINAL DETACHMENT SURGERY  03/2011   rt   TOTAL KNEE ARTHROPLASTY  2003   rt   TOTAL KNEE ARTHROPLASTY  02/07/2012   Procedure: TOTAL KNEE ARTHROPLASTY;  Surgeon: [name], MD;  Location: [place];  Service: Orthopedics;  Laterality: Left;   HPI:  Pt is a 75 y.o. male presented to ED today for complaints of shortness of breath. Chest xray (9/12) significant for "patchy and confluent opacity in the right mid and lower lung is nonspecific but suggestive of multilobar pneumonia". Pt was started on antibiotics.  Pt later found unresponsive and ABG revealed hypercarbic respiratory failure.  He required intubation and mechanical ventilation (9/12- 9/14). Work up for infection in progress. Aspiration PNA suspected in setting of chonic dysphagia per MD note 9/14. Pt has been seen by ENT previously with recent Esophagram (12/17/20) revealing mild dysmotility, small hiatal hernia. Also noted was "no gross swallow dysfunction aside from mild  penetration". 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   Assessment / Plan / Recommendation Clinical Impression  Pt seen for bedside swallow evaluation with  wife present to assist with report of PLOF. At baseline, pt consumed regular, thin liquid diet with no reported difficulty swallowing. Oral mechanism examination unremarkable, except for missing lower dentition and absent upper dentures, which pt reports to wear at home during meals. Pt presents with breathy and hoarse vocal quality with strong, but congested, volitional cough. Ice chips x2 resulted in delayed coughing with more immediate overt symptoms occuring with sips of thin via cup and straw. Wet vocal quality more evident with greater volumes, suggestive of airway  compromise. Sips of NTL via straw eliminated all s/sx. Puree consumed without overt s/sx of aspiration and complete oral clearance noted. Regular textured solid poorly masticated and ultimately expectored with pt stating he requires his dentures for advanced textures. Recommend initiation of dys 1, NTL with SLP to f/u for further PO trials and instrumental assessment given hx of dysphagia and concern for aspiration PNA per MD note (9/14).  SLP Visit Diagnosis: Dysphagia, unspecified (R13.10)    Aspiration Risk  Moderate aspiration risk    Diet Recommendation Dysphagia 1 (Puree);Nectar-thick liquid   Liquid Administration via: Cup;Straw Medication Administration: Crushed with puree Supervision: Full supervision/cueing for compensatory strategies;Staff to assist with self feeding Compensations: Slow rate;Small sips/bites Postural Changes: Seated upright at 90 degrees    Other  Recommendations Oral Care Recommendations: Oral care BID;Staff/trained caregiver to provide oral care   Follow up Recommendations Other (comment) (TBD)      Frequency and Duration min 2x/week  2 weeks       Prognosis Prognosis for Safe Diet Advancement: Good Barriers to Reach Goals: Severity of deficits      Swallow Study   General Date of Onset: 12/31/20 HPI: Pt is a 75 y.o. male presented to ED today for complaints of shortness of breath. Chest xray (9/12) significant for "patchy and confluent opacity in the right mid and lower lung is nonspecific but suggestive of multilobar pneumonia". Pt was started on antibiotics.  Pt later found unresponsive and ABG revealed hypercarbic respiratory failure.  He required intubation and mechanical ventilation (9/12- 9/14). Work up for infection in progress. Aspiration PNA suspected in setting of chonic dysphagia per MD note 9/14. Pt has been seen by ENT previously with recent Esophagram (12/17/20) revealing mild dysmotility, small hiatal hernia. Also noted was "no gross  swallow dysfunction aside from mild  penetration". 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 Type of Study: Bedside Swallow Evaluation Previous Swallow Assessment: none per EMR Diet Prior to this Study: NPO Temperature Spikes Noted: No Respiratory Status: Nasal cannula History of Recent Intubation: Yes Length of Intubations (days): 2 days Date extubated: 12/31/20 Behavior/Cognition: Alert;Cooperative;Pleasant mood Oral Cavity Assessment: Within Functional Limits Oral Care Completed by SLP: No Oral Cavity - Dentition: Missing dentition;Poor condition;Other (Comment) (absent upper dentition) Vision: Functional for self-feeding Self-Feeding Abilities: Needs assist Patient Positioning: Upright in bed;Postural control adequate for testing Baseline Vocal Quality: Hoarse;Breathy;Low vocal intensity Volitional Cough: Congested;Strong Volitional Swallow: Able to elicit    Oral/Motor/Sensory Function Overall Oral Motor/Sensory Function: Within functional limits   Ice Chips Ice chips: Impaired Presentation: Spoon Pharyngeal Phase Impairments: Cough - Delayed   Thin Liquid Thin Liquid: Impaired Presentation: Cup;Self Fed;Straw Pharyngeal  Phase Impairments: Cough - Delayed;Wet Vocal Quality    Nectar Thick Nectar Thick Liquid: Within functional limits Presentation: Straw   Honey Thick Honey Thick Liquid: Not tested   Puree Puree: Within  functional limits Presentation: Spoon   Solid     Solid: Impaired Oral Phase Impairments: Impaired mastication Oral Phase Functional Implications: Impaired mastication     Ellwood Dense, MA, [place] Office Number: [phone]  Acie Fredrickson 12/31/2020,2:13 PM

## 2020-12-31 NOTE — Progress Notes (Signed)
eLink Physician-Brief Progress Note Patient Name: Garrett Munoz DOB: April 22, 1945 MRN: [id number]   Date of Service  12/31/2020  HPI/Events of Note  K+ 2.5.  eICU Interventions  KCL 40 meq via NG tube x 1, KCL 10 meq iv Q 1 hour x  hours.        Kerry Kass Jasmyne Lodato 12/31/2020, 6:05 AM

## 2020-12-31 NOTE — Progress Notes (Signed)
SLP Cancellation Note  Patient Details Name: REVEN RAPTIS MRN: [id number] DOB: 1945/06/01   Cancelled treatment:       Reason Eval/Treat Not Completed: Patient not medically ready;Medical issues which prohibited therapy (Pt remains intubated at this time. SLP will follow up.)  Alondria Mousseau I. Hardin Negus, [place], [place] Office number [phone] Pager [place] 12/31/2020, 8:25 AM

## 2020-12-31 NOTE — Progress Notes (Signed)
Pharmacy Electrolyte Replacement  Recent Labs:  Recent Labs    12/30/20 1500 12/31/20 0419 12/31/20 1240  K 3.0* 2.5* 3.4*  MG  --  2.0  --   PHOS 3.2  --   --   CREATININE 1.07 1.02 0.88    Low Critical Values (K </= 2.5, Phos </= 1, Mg </= 1) Present: None  MD Contacted: Dr. Tamala Julian  Plan: KCl 40 mEq per tube x1   Sloan Leiter, PharmD, BCPS, BCCCP Clinical Pharmacist Please refer to [facility] for [place] numbers 12/31/2020, 3:04 PM

## 2021-01-01 ENCOUNTER — Inpatient Hospital Stay (HOSPITAL_COMMUNITY): Payer: PPO

## 2021-01-01 DIAGNOSIS — J9601 Acute respiratory failure with hypoxia: Secondary | ICD-10-CM | POA: Diagnosis not present

## 2021-01-01 DIAGNOSIS — J9602 Acute respiratory failure with hypercapnia: Secondary | ICD-10-CM | POA: Diagnosis not present

## 2021-01-01 LAB — BASIC METABOLIC PANEL
Anion gap: 6 (ref 5–15)
BUN: 13 mg/dL (ref 8–23)
CO2: 33 mmol/L — ABNORMAL HIGH (ref 22–32)
Calcium: 8.2 mg/dL — ABNORMAL LOW (ref 8.9–10.3)
Chloride: 102 mmol/L (ref 98–111)
Creatinine, Ser: 0.72 mg/dL (ref 0.61–1.24)
GFR, Estimated: >60 mL/min (ref >60)
Glucose, Bld: 79 mg/dL (ref 70–99)
Potassium: 3.6 mmol/L (ref 3.5–5.1)
Sodium: 141 mmol/L (ref 135–145)

## 2021-01-01 LAB — CULTURE, RESPIRATORY W GRAM STAIN: Culture: NORMAL

## 2021-01-01 LAB — MAGNESIUM: Magnesium: 2.1 mg/dL (ref 1.7–2.4)

## 2021-01-01 LAB — GLUCOSE, CAPILLARY
Glucose-Capillary: 55 mg/dL — ABNORMAL LOW (ref 70–99)
Glucose-Capillary: 95 mg/dL (ref 70–99)
Glucose-Capillary: 73 mg/dL (ref 70–99)
Glucose-Capillary: 126 mg/dL — ABNORMAL HIGH (ref 70–99)
Glucose-Capillary: 105 mg/dL — ABNORMAL HIGH (ref 70–99)
Glucose-Capillary: 83 mg/dL (ref 70–99)
Glucose-Capillary: 105 mg/dL — ABNORMAL HIGH (ref 70–99)

## 2021-01-01 LAB — CBC
HCT: 45.1 % (ref 39.0–52.0)
Hemoglobin: 14.5 g/dL (ref 13.0–17.0)
MCH: 31.9 pg (ref 26.0–34.0)
MCHC: 32.2 g/dL (ref 30.0–36.0)
MCV: 99.1 fL (ref 80.0–100.0)
Platelets: 104 10*3/uL — ABNORMAL LOW (ref 150–400)
RBC: 4.55 MIL/uL (ref 4.22–5.81)
RDW: 14.6 % (ref 11.5–15.5)
WBC: 9.5 10*3/uL (ref 4.0–10.5)
nRBC: 0 % (ref 0.0–0.2)

## 2021-01-01 LAB — HEPARIN LEVEL (UNFRACTIONATED): Heparin Unfractionated: 0.46 IU/mL (ref 0.30–0.70)

## 2021-01-01 MED ORDER — DEXTROSE 50 % IV SOLN: 12.5000 g | INTRAVENOUS | Status: AC

## 2021-01-01 MED ORDER — POTASSIUM CHLORIDE 20 MEQ PO PACK
40.0000 meq | PACK | Freq: Four times a day (QID) | ORAL | Status: AC
Start: 2021-01-01 — End: 2021-01-01
  Administered 2021-01-01 (×2): 40 meq via ORAL
  Filled 2021-01-01 (×2): qty 2

## 2021-01-01 MED ORDER — POTASSIUM CHLORIDE CRYS ER 20 MEQ PO TBCR
40.0000 meq | EXTENDED_RELEASE_TABLET | Freq: Four times a day (QID) | ORAL | Status: DC
Start: 2021-01-01 — End: 2021-01-01

## 2021-01-01 MED ORDER — AZITHROMYCIN 500 MG PO TABS
500.0000 mg | ORAL_TABLET | Freq: Every day | ORAL | Status: AC
  Administered 2021-01-02: 500 mg via ORAL
  Filled 2021-01-01: qty 1

## 2021-01-01 MED ORDER — TEMAZEPAM 15 MG PO CAPS
30.0000 mg | ORAL_CAPSULE | Freq: Every evening | ORAL | Status: DC | PRN
  Administered 2021-01-01 – 2021-01-07 (×6): 30 mg via ORAL
  Filled 2021-01-01 (×6): qty 2

## 2021-01-01 MED ORDER — APIXABAN 5 MG PO TABS
5.0000 mg | ORAL_TABLET | Freq: Two times a day (BID) | ORAL | Status: DC
  Administered 2021-01-01 – 2021-01-08 (×15): 5 mg via ORAL
  Filled 2021-01-01 (×7): qty 1
  Filled 2021-01-01: qty 2
  Filled 2021-01-01 (×7): qty 1

## 2021-01-01 MED ORDER — POTASSIUM CHLORIDE CRYS ER 20 MEQ PO TBCR
40.0000 meq | EXTENDED_RELEASE_TABLET | Freq: Once | ORAL | Status: AC
  Administered 2021-01-01: 40 meq via ORAL
  Filled 2021-01-01: qty 2

## 2021-01-01 MED ORDER — CALCIUM CITRATE 950 (200 CA) MG PO TABS
200.0000 mg | ORAL_TABLET | Freq: Every day | ORAL | Status: DC
  Administered 2021-01-02 – 2021-01-08 (×7): 200 mg via ORAL
  Filled 2021-01-01 (×7): qty 1

## 2021-01-01 MED ORDER — AMOXICILLIN-POT CLAVULANATE 875-125 MG PO TABS
1.0000 | ORAL_TABLET | Freq: Two times a day (BID) | ORAL | Status: AC
  Administered 2021-01-01 – 2021-01-03 (×6): 1 via ORAL
  Filled 2021-01-01 (×6): qty 1

## 2021-01-01 MED ORDER — LOSARTAN POTASSIUM 25 MG PO TABS
25.0000 mg | ORAL_TABLET | Freq: Every day | ORAL | Status: DC
  Administered 2021-01-01 – 2021-01-08 (×8): 25 mg via ORAL
  Filled 2021-01-01 (×9): qty 1

## 2021-01-01 MED ORDER — FUROSEMIDE 10 MG/ML IJ SOLN
80.0000 mg | Freq: Once | INTRAMUSCULAR | Status: AC
  Administered 2021-01-01: 80 mg via INTRAVENOUS
  Filled 2021-01-01: qty 8

## 2021-01-01 MED ORDER — CARVEDILOL 6.25 MG PO TABS
6.2500 mg | ORAL_TABLET | Freq: Two times a day (BID) | ORAL | Status: DC
  Administered 2021-01-01 – 2021-01-08 (×15): 6.25 mg via ORAL
  Filled 2021-01-01 (×15): qty 1

## 2021-01-01 MED ORDER — DEXTROSE 50 % IV SOLN
INTRAVENOUS | Status: AC
  Administered 2021-01-01: 12.5 g via INTRAVENOUS
  Filled 2021-01-01: qty 50

## 2021-01-01 NOTE — Progress Notes (Addendum)
Nutrition Follow-up  DOCUMENTATION CODES:   Not applicable  INTERVENTION:   -Advance diet at medically appropriate per SLP       -Encourage good PO intake  -Start double protein at each meal  -Magic cup BID with meals, each supplement provides 290 kcal and 9 grams of protein   NUTRITION DIAGNOSIS:   Inadequate oral intake related to inability to eat as evidenced by NPO status. - Progressing, pt now on D3 diet w/ Honey thick liquids  GOAL:   Patient will meet greater than or equal to 90% of their needs - Progressing  MONITOR:   PO intake, Diet advancement, Supplement acceptance  REASON FOR ASSESSMENT:   Ventilator, Consult Enteral/tube feeding initiation and management  ASSESSMENT:   75 year old male who presented to the ED on 9/12 with SOB. PMH of HTN, HLD, atrial fibrillation, COPD, CHF, PMR on chronic steroids. Pt admitted with acute respiratory failure secondary to pneumonia. Pt required intubation.  9/12: Intubation 9/14: Extubated; SLP evaluation- D1 w/ Nectar Thick Liquids  9/15: D3 diet w/ Honey thick liquids  Pt resting in recliner, daughter and wife present at time of visit. Pt reports that he is hungry and that he is hungry and needs some "grub" in his food. Per pt wife, pt at his grits, pineapple, applesauce, and nectar thick milk; stated he will not eat the pureed foods.  Discussed ONS with pt; pt agreeable to ONS when medically appropriate. Will provide double protein with each meal.  Per EMR, pt has an intake of 20% at dinner on 9/14 and 25% for lunch at 9/15.  SLP evaluated pt today; advanced diet to D3 w/ Honey thick liquids.  Medications reviewed and include: Eliquis, Zithromax, Augmentin, Vitamin D3, Potassium Chloride, Prednisone Labs reviewed: 24 hr BG trends: 55- 174 mg/dL  Diet Order:   Diet Order             DIET DYS 3 Room service appropriate? Yes with Assist; Fluid consistency: Honey Thick  Diet effective now                    EDUCATION NEEDS:   No education needs have been identified at this time  Skin:  Skin Assessment: Skin Integrity Issues: Skin Integrity Issues:: Other (Comment) Other: Moisture Associated Skin Damage - Buttocks & Thighs  Last BM:  9/15  Height:   Ht Readings from Last 1 Encounters:  12/29/20 '5\' 10"'$  (1.778 m)    Weight:   Wt Readings from Last 1 Encounters:  12/31/20 (!) 149.5 kg    Ideal Body Weight:  75.5 kg  BMI:  Body mass index is 47.29 kg/m.  Estimated Nutritional Needs:   Kcal:  2100-2300  Protein:  110-130 gm  Fluid:  >/= 2.1 L    Narcissa Melder BS, PLDN Clinical Dietitian See [facility] for contact information.

## 2021-01-01 NOTE — TOC Initial Note (Signed)
Transition of Care [facility]) - Initial/Assessment Note    Patient Details  Name: Garrett Munoz MRN: [id number] Date of Birth: 1945-05-30  Transition of Care [facility]) CM/SW Contact:    Tom-Johnson, Renea Ee, RN Phone Number: 01/01/2021, 3:53 PM  Clinical Narrative:                 CM went to speak with patient about needs for post hospital transition. Patient was working with therapy at the time. Wife and grand daughter at bedside. CM left phone number for wife to call after therapy session. Wife called CM and patient gave permission to speak with wife via phone. Wife stated she lives with patient and they have three daughters. Patient has one daughter and she has two daughters. They have eight grand children and three great grand children. Patient was very active, driving self to and from appointments, mowing the lawn and doing errands prior to hospitalization. Patient has a walker, wheelchair, cane, handicap shower and handicap commode at home. Has used home health services before but cannot remember the name of the agency. CM gave a list of home health agencies from Medicare.gov to patient and wife and they chose [facility]. Tommi Rumps [phone]) notified and agreed to PT/OT referral. Information placed on the AVS. Patient is supposed to be transferred to a progressive unit. TOC will continue to follow with needs.  Expected Discharge Plan: [place] Barriers to Discharge: Continued Medical Work up   Patient Goals and CMS Choice Patient states their goals for this hospitalization and ongoing recovery are:: to go home CMS Medicare.gov Compare Post Acute Care list provided to:: Patient Choice offered to / list presented to : Patient, Spouse  Expected Discharge Plan and Services Expected Discharge Plan: [place]   Discharge Planning Services: CM Consult Post Acute Care Choice: [place] arrangements for the past 2 months: Single Family Home                            HH Arranged: PT, OT HH Agency: [facility] Date [place]: 01/01/21 Time [place]: [id number] Representative spoke with at [place]: Tommi Rumps  Prior Living Arrangements/Services Living arrangements for the past 2 months: [place] with:: Spouse Patient language and need for interpreter reviewed:: Yes Do you feel safe going back to the place where you live?: Yes      Need for Family Participation in Patient Care: Yes (Comment) Care giver support system in place?: Yes (comment) Current home services: DME (Wheelchair, walker, bedside commode, handicap commode, handicap shower.) Criminal Activity/Legal Involvement Pertinent to Current Situation/Hospitalization: No - Comment as needed  Activities of Daily Living      Permission Sought/Granted Permission sought to share information with : Case Manager, Customer service manager, Family Supports Permission granted to share information with : Yes, Verbal Permission Granted              Emotional Assessment Appearance:: Appears stated age Attitude/Demeanor/Rapport: Engaged Affect (typically observed): Accepting, Appropriate, Hopeful Orientation: : Oriented to Self, Oriented to Place, Oriented to  Time, Oriented to Situation Alcohol / Substance Use: Alcohol Use (Occassionally.) Psych Involvement: No (comment)  Admission diagnosis:  Respiratory failure ([place]) [J96.90] Encounter for central line placement [Z45.2] Acute respiratory failure with hypoxia ([place]) [J96.01] History of ETT [Z92.89] Sepsis due to pneumonia ([place]) [J18.9, A41.9] Community acquired pneumonia of right lower lobe of lung [J18.9] Sepsis,  due to unspecified organism, unspecified whether acute organ dysfunction present [facility]) [A41.9] Patient Active Problem List   Diagnosis Date Noted   Sepsis due to pneumonia ([place]) 12/29/2020   Respiratory failure ([place]) 12/29/2020   Cough variant asthma vs UACS  10/16/2020   DOE (dyspnea on exertion) 10/16/2020   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 10/16/2020   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   High risk medication use 04/10/2020   CHF NYHA class III, acute on chronic, diastolic (HCC)    Physical deconditioning [id number]   Acute metabolic encephalopathy [id number]   Prediabetes 03/10/2020   Acute CHF (congestive heart failure) ([place]) 03/09/2020   Acute respiratory failure with hypoxia and hypercapnia (HCC)    Acquired thrombophilia ([place]) 10/19/2019   Irritable bowel syndrome (IBS) 02/27/2019   Bilateral carpal tunnel syndrome 08/28/2018   Eustachian tube dysfunction, left 02/09/2018   Sensorineural hearing loss (SNHL), 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   Tinnitus of both ears 02/09/2018   Spinal stenosis at L4-L5 level 08/18/2017   Spinal stenosis of lumbar region 07/19/2017   Degeneration of lumbosacral intervertebral disc 05/28/2017   Low back pain 05/28/2017   Gout 08/24/2016   S/P total knee replacement 08/24/2016   Cortical age-related cataract of left eye 08/24/2016   Occult blood in stools 11/10/2015   CAP (community acquired pneumonia) 04/10/2015   Fatigue 04/10/2015   Hypertensive disorder 04/10/2015   HCAP (healthcare-associated pneumonia)    Persistent atrial fibrillation ([place])    Anticoagulation adequate 02/21/2015   Obstructive sleep apnea syndrome 02/01/2015   Atrial fibrillation ([place]) 02/01/2015   History of gout 02/01/2015   Morbid obesity ([place]) 02/01/2015   Pain in left knee 02/24/2012   Weakness of left leg 02/24/2012   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 [place] [address] Phone: [phone] Fax: [phone]     Social Determinants of Health (SDOH) Interventions    Readmission Risk Interventions No  flowsheet data found.

## 2021-01-01 NOTE — Progress Notes (Signed)
[facility] ADULT ICU REPLACEMENT PROTOCOL   The patient does apply for the [facility] Adult ICU Electrolyte Replacment Protocol based on the criteria listed below:   1.Exclusion criteria: TCTS patients, ECMO patients and Hypothermia Protocol, and   Dialysis patients 2. Is GFR >/= 30 ml/min? Yes.    Patient's GFR today is >60 3. Is SCr </= 2? No. Patient's SCr is 0.72 mg/dL 4. Did SCr increase >/= 0.5 in 24 hours? No. 5.Pt's weight >40kg  Yes.   6. Abnormal electrolyte(s): K+ 3.6  7. Electrolytes replaced per protocol 8.  Call MD STAT for K+ </= 2.5, Phos </= 1, or Mag </= 1 Physician:  n/a   Garrett Munoz 01/01/2021 5:52 AM

## 2021-01-01 NOTE — Progress Notes (Signed)
ANTICOAGULATION CONSULT NOTE - Follow Up Consult  Pharmacy Consult for heparin Indication: atrial fibrillation  No Known Allergies  Patient Measurements: Height: '5\' 10"'$  (177.8 cm) Weight: (!) 149.5 kg (329 lb 9.4 oz) IBW/kg (Calculated) : 73 Heparin Dosing Weight: 109 kg  Vital Signs: Temp: 98.7 F (37.1 C) (09/15 0410) Temp Source: Oral (09/15 0410) BP: 116/72 (09/15 0600) Pulse Rate: 103 (09/15 0600)  Labs: Recent Labs     0000 12/29/20 0839 12/29/20 1045 12/29/20 1714 12/30/20 0247 12/30/20 1200 12/30/20 1500 12/30/20 2200 12/31/20 0419 12/31/20 1240 01/01/21 0334  HGB  --  19.0*  --    < > 16.7  --   --   --  15.7  --  14.5  HCT  --  58.8*  --    < > 50.5  --   --   --  46.8  --  45.1  PLT  --  143*  --   --  155  --   --   --  123*  --  104*  APTT   < >  --  24  --  58* 113*  --  90*  --   --   --   LABPROT  --   --  13.4  --   --   --   --   --   --   --   --   INR  --   --  1.0  --   --   --   --   --   --   --   --   HEPARINUNFRC  --   --   --    < > 0.40  --   --  0.62 0.54  --  0.46  CREATININE  --  1.06  --   --  1.09  --    < >  --  1.02 0.88 0.72  TROPONINIHS  --  14 14  --   --   --   --   --   --   --   --    < > = values in this interval not displayed.    Estimated Creatinine Clearance: 115.1 mL/min (by C-G formula based on SCr of 0.72 mg/dL).   Assessment: 32 YOM with hypoxia requiring mechanical intubation. Pharmacy consulted to start IV heparin for Afib. On Eliquis PTA and last dose was 9/11.   Heparin level has been therapeutic on 1700 units/hr. Most recent level 0.46. Heparin has now been discontinued and patient has been started back on Eliquis from PTA. Platelets have been trending down, currently at 104. No bleeding noted.   Plan:  -Discontinue heparin drip -Monitor CBC and s/s of bleeding  -Monitor platelet count   Thank you for including pharmacy in the care of this patient.  Zenaida Deed, PharmD PGY1 Acute Care Pharmacy  Resident  Phone: [phone] 01/01/2021  10:49 AM  Please check [email or web address] for unit-specific pharmacy phone numbers.

## 2021-01-01 NOTE — Evaluation (Signed)
Physical Therapy Evaluation Patient Details Name: Garrett Munoz MRN: [id number] DOB: Jun 07, 1945 Today's Date: 01/01/2021  History of Present Illness  75 yo male admitted 9/12 with acute hypercarbic respiratory failure secondary to pneumonia.  Pt intubated 9/12- 9/14. PMhx:COPD, AFib, diastolic heart failure, OSA, gout, TKA  Clinical Impression  Pt tolerated treatment well and was motivated to get OOB. Pt performed bed mobility with min A to pull to sitting. Transfers and ambulation min guard with verbal cueing. Pt tachycardic during session with max HR 150 (see below). Pt's deficits include decreased balance and activity tolerance. Would benefit from continued PT to improve deficits and function. Recommend HHPT upon d/c given pt's functional status and support at home.   Pre-BP - 117/77(91) HR 112-150, spO2 90-93% during session Post BP - 132/93(105)       Recommendations for follow up therapy are one component of a multi-disciplinary discharge planning process, led by the attending physician.  Recommendations may be updated based on patient status, additional functional criteria and insurance authorization.  Follow Up Recommendations Home health PT;Supervision for mobility/OOB    Equipment Recommendations  None recommended by PT    Recommendations for Other Services       Precautions / Restrictions        Mobility  Bed Mobility Overal bed mobility: Needs Assistance Bed Mobility: Supine to Sit     Supine to sit: HOB elevated;Min assist     General bed mobility comments: 35degree HOB with min assist to fully elevate trunk with cues for breathing as pt with tendency to hold his breath. HR up to 140 with transition to sitting    Transfers Overall transfer level: Needs assistance   Transfers: Sit to/from Stand Sit to Stand: Min assist         General transfer comment: cues for hand placement with varied assist between min and minguard for transfers from bed and  recliner  Ambulation/Gait Ambulation/Gait assistance: Min guard Gait Distance (Feet): 30 Feet Assistive device: Rolling walker (2 wheeled) Gait Pattern/deviations: Step-through pattern;Trunk flexed;Decreased stride length   Gait velocity interpretation: 1.31 - 2.62 ft/sec, indicative of limited community ambulator General Gait Details: cues for posture and sequence. Gait limited by HR 150 (tachy with Afib). After seated rest HR down to 120 and attempted 2nd trial however HR back to 147 after 2 steps and unable to progress gait further  Stairs            Wheelchair Mobility    Modified Rankin (Stroke Patients Only)       Balance Overall balance assessment: Needs assistance Sitting-balance support: No upper extremity supported;Feet supported Sitting balance-Leahy Scale: Fair Sitting balance - Comments: EoB without UE assist   Standing balance support: Bilateral upper extremity supported Standing balance-Leahy Scale: Poor Standing balance comment: reliant on RW for standing and gait                             Pertinent Vitals/Pain Pain Assessment: No/denies pain    Home Living Family/patient expects to be discharged to:: Private residence Living Arrangements: Spouse/significant other Available Help at Discharge: Family;Available 24 hours/day Type of Home: House Home Access: Stairs to enter Entrance Stairs-Rails: None Entrance Stairs-Number of Steps: 3 Home Layout: One level Home Equipment: Walker - 2 wheels;Walker - 4 wheels;Cane - single point;Shower seat;Grab bars - toilet;Grab bars - tub/shower;Wheelchair - manual      Prior Function Level of Independence: Independent  Hand Dominance   Dominant Hand: Right    Extremity/Trunk Assessment   Upper Extremity Assessment Upper Extremity Assessment: Defer to OT evaluation    Lower Extremity Assessment Lower Extremity Assessment: Generalized weakness    Cervical / Trunk  Assessment Cervical / Trunk Assessment: Normal;Other exceptions Cervical / Trunk Exceptions: body habitus  Communication   Communication: HOH  Cognition Arousal/Alertness: Awake/alert Behavior During Therapy: WFL for tasks assessed/performed Overall Cognitive Status: Within Functional Limits for tasks assessed                                 General Comments: Follows one-step commands inconsistently      General Comments General comments (skin integrity, edema, etc.): HR 112-150 during session, spO2 >90% on 4L    Exercises     Assessment/Plan    PT Assessment Patient needs continued PT services  PT Problem List Decreased activity tolerance;Decreased balance;Decreased mobility       PT Treatment Interventions Gait training;Stair training;DME instruction;Functional mobility training;Therapeutic activities;Therapeutic exercise;Balance training    PT Goals (Current goals can be found in the Care Plan section)  Acute Rehab PT Goals Patient Stated Goal: To go home and do yard work PT Goal Formulation: With patient Time For Goal Achievement: 01/15/21 Potential to Achieve Goals: Good    Frequency Min 3X/week   Barriers to discharge        Co-evaluation               AM-PAC PT "6 Clicks" Mobility  Outcome Measure Help needed turning from your back to your side while in a flat bed without using bedrails?: A Little Help needed moving from lying on your back to sitting on the side of a flat bed without using bedrails?: A Little Help needed moving to and from a bed to a chair (including a wheelchair)?: A Little Help needed standing up from a chair using your arms (e.g., wheelchair or bedside chair)?: A Little Help needed to walk in hospital room?: A Little Help needed climbing 3-5 steps with a railing? : A Lot 6 Click Score: 17    End of Session Equipment Utilized During Treatment: Gait belt;Oxygen Activity Tolerance: Patient tolerated treatment  well Patient left: in chair;with call bell/phone within reach;with family/visitor present Nurse Communication: Mobility status PT Visit Diagnosis: Other abnormalities of gait and mobility (R26.89);Muscle weakness (generalized) (M62.81);Unsteadiness on feet (R26.81)    Time: [id number] PT Time Calculation (min) (ACUTE ONLY): 29 min   Charges:   PT Evaluation $PT Eval Moderate Complexity: 1 Mod PT Treatments $Gait Training: 8-22 mins        Louie Casa, SPT Acute Rehab: [phone]   Domingo Dimes 01/01/2021, 7:58 AM

## 2021-01-01 NOTE — Procedures (Signed)
Objective Swallowing Evaluation: Type of Study: FEES-Fiberoptic Endoscopic Evaluation of Swallow   Patient Details  Name: Garrett Munoz MRN: [id number] Date of Birth: [birth year]  Today's Date: 01/01/2021 Time: SLP Start Time (ACUTE ONLY): 1108 -SLP Stop Time (ACUTE ONLY): [id number]  SLP Time Calculation (min) (ACUTE ONLY): 31 min   Past Medical History:  Past Medical History:  Diagnosis Date   Atrial fibrillation ([place])    Bronchitis    hx   Cataract    right eye   CHF NYHA class III, acute on chronic, diastolic ([facility])    COPD (chronic obstructive pulmonary disease) ([facility])    DDD (degenerative disc disease), lumbosacral    Dysrhythmia    irregular heatbeat   Eye worm    right eye   Gout    History of kidney stones    Hypertension    Left knee DJD    Morbid obesity with BMI of 40.0-44.9, adult ([place])    Pneumonia    hx   Prediabetes    Radicular syndrome of left leg    S/P total knee replacement    right   Sleep apnea    can't use sleep study 10 yrs ago   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    Past Surgical History:  Past Surgical History:  Procedure Laterality Date   CARDIOVERSION N/A 03/04/2015   Procedure: CARDIOVERSION;  Surgeon: [name], MD;  Location: [facility];  Service: Cardiovascular;  Laterality: N/A;   CHOLECYSTECTOMY     COLONOSCOPY N/A 01/01/2016   Procedure: COLONOSCOPY;  Surgeon: [name], MD;  [facility];  Service: Endoscopy;  Laterality: N/A;  1:45   COLONOSCOPY N/A 01/14/2017   Procedure: COLONOSCOPY;  Surgeon: [name], MD;  [facility];  Service: Endoscopy;  Laterality: N/A;  12:05   EYE EXAMINATION UNDER ANESTHESIA W/ RETINAL CRYOTHERAPY AND RETINAL LASER  07/2011   EYE SURGERY  02/2011   cat rt   KNEE ARTHROSCOPY     bilateral   LUMBAR LAMINECTOMY/DECOMPRESSION MICRODISCECTOMY N/A 08/18/2017   Procedure: Bilateral microlumbar decompression Lumbar four-Lumbar five;  Surgeon: [name], MD;  Location: [facility];  Service: Orthopedics;  Laterality: N/A;   POLYPECTOMY  01/01/2016   Procedure: POLYPECTOMY;  Surgeon: [name], MD;  [facility];  Service: Endoscopy;;  colon   RETINAL DETACHMENT SURGERY  03/2011   rt   TOTAL KNEE ARTHROPLASTY  2003   rt   TOTAL KNEE ARTHROPLASTY  02/07/2012   Procedure: TOTAL KNEE ARTHROPLASTY;  Surgeon: [name], MD;  Location: [facility];  Service: Orthopedics;  Laterality: Left;   HPI: Pt is a 75 y.o. male presented to ED for complaints of shortness of breath. Chest xray (9/12) significant for "patchy and confluent opacity in the right mid and lower lung is nonspecific but suggestive of multilobar pneumonia".   Pt later found unresponsive and ABG revealed hypercarbic respiratory failure. Intubated 9/12- 9/14. Aspiration PNA suspected in setting of chronic dysphagia per MD note 9/14. Pt has been seen by ENT previously with recent Esophagram (12/17/20) revealing mild dysmotility, small hiatal hernia. Also noted was "no gross swallow dysfunction aside from mild penetration".  Wife and daughter reported frequent coughing after meals over an extended period. PMH: COPD, HTN, pre-diabetes, OSA, pAFib, diastolic heart failure, followed by pulmonary OP for upper airway cough syndrome and dyspnea on exertion since Nov 2021.   Subjective: Pt awake and hesitant to try honey thick liquid diet.  Assessment / Plan / Recommendation  CHL IP CLINICAL IMPRESSIONS 01/01/2021  Clinical Impression Pt was seen at bedside for FEES. Non swallow speech tasks revealed mildly reduced tongue base retraction and pharyngeal wall contraction. SLP observed what appeared to be candida albicans on base of tongue in addition to general erythema and edematous arytenoid cartilidges and posterior commisure.  Dysphagia appears multifactoral including hypopharyngeal candidias, recent intubation, history of esophageal dysphagia with suspected LPR (laryngopharyngeal reflux), COPD and  deconditioning. Overall pharyngeal phase c/b penetration/aspiration during swallow with thin and nectar-thick liquids and moderate residue in vallecula, pyriform sinuses, interarytenoid space, and pharyngeal wall with all consistencies. Interarytenoid space residue observed falling airway resulting in penetration/aspiration after swallow. Typically, thicker consistency yields greater residue however trials of honey thick liquid decreased residue possibly due to heavier and improved coordination of bolus. No airway intrusion with honey thick. SLP trialed the following compensatory strategies: multiple swallows, throat clear, and alternating solids and liquids. Pt stimulable for all strategies. Multiple swallows cleared significant amount of residue across consistencies and throat clear resulted in ejection of majority of penetration and possibly aspiration at all levels. Following solids with liquids cleared solid residue. Pt exhibited SOB near end of evaluation. Recommend Dys 3/honey thick liquid to aid in energy conservation during mastication to facilitate swallow safety. SLP will follow for upgraded texture trials and prognosis for return to thin liquids is good once endurance and strength improves. Consider GI consult for further assessment. Pt, pt's wife and daughter present and educated.  SLP Visit Diagnosis Dysphagia, pharyngoesophageal phase (R13.14)  Attention and concentration deficit following --  Frontal lobe and executive function deficit following --  Impact on safety and function Moderate aspiration risk      CHL IP TREATMENT RECOMMENDATION 01/01/2021  Treatment Recommendations Therapy as outlined in treatment plan below     Prognosis 01/01/2021  Prognosis for Safe Diet Advancement Good  Barriers to Reach Goals Other (Comment)  Barriers/Prognosis Comment --    CHL IP DIET RECOMMENDATION 01/01/2021  SLP Diet Recommendations Dysphagia 3 (Mech soft) solids;Honey thick liquids  Liquid  Administration via Cup  Medication Administration Crushed with puree  Compensations Clear throat intermittently;Multiple dry swallows after each bite/sip;Follow solids with liquid;Slow rate;Small sips/bites  Postural Changes Seated upright at 90 degrees      CHL IP OTHER RECOMMENDATIONS 01/01/2021  Recommended Consults Consider GI evaluation  Oral Care Recommendations Oral care BID  Other Recommendations --      CHL IP FOLLOW UP RECOMMENDATIONS 01/01/2021  Follow up Recommendations (No Data)      CHL IP FREQUENCY AND DURATION 01/01/2021  Speech Therapy Frequency (ACUTE ONLY) min 2x/week  Treatment Duration 2 weeks           CHL IP ORAL PHASE 01/01/2021  Oral Phase WFL  Oral - Pudding Teaspoon --  Oral - Pudding Cup --  Oral - Honey Teaspoon --  Oral - Honey Cup --  Oral - Nectar Teaspoon --  Oral - Nectar Cup --  Oral - Nectar Straw --  Oral - Thin Teaspoon --  Oral - Thin Cup --  Oral - Thin Straw --  Oral - Puree --  Oral - Mech Soft --  Oral - Regular --  Oral - Multi-Consistency --  Oral - Pill --  Oral Phase - Comment --    CHL IP PHARYNGEAL PHASE 01/01/2021  Pharyngeal Phase Impaired  Pharyngeal- Pudding Teaspoon --  Pharyngeal --  Pharyngeal- Pudding Cup --  Pharyngeal --  Pharyngeal- Honey Teaspoon  NT  Pharyngeal --  Pharyngeal- Honey Cup Pharyngeal residue - valleculae;Pharyngeal residue - pyriform;Pharyngeal residue - posterior pharnyx  Pharyngeal Material does not enter airway  Pharyngeal- Nectar Teaspoon NT  Pharyngeal --  Pharyngeal- Nectar Cup Pharyngeal residue - valleculae;Pharyngeal residue - pyriform;Pharyngeal residue - posterior pharnyx;Penetration/Aspiration during swallow;Penetration/Apiration after swallow;Inter-arytenoid space residue  Pharyngeal Material enters airway, remains ABOVE vocal cords and not ejected out  Pharyngeal- Nectar Straw Pharyngeal residue - valleculae;Pharyngeal residue - pyriform;Pharyngeal residue - posterior  pharnyx;Penetration/Aspiration during swallow;Penetration/Apiration after swallow;Inter-arytenoid space residue  Pharyngeal Material enters airway, CONTACTS cords and not ejected out;Material enters airway, passes BELOW cords without attempt by patient to eject out (silent aspiration)  Pharyngeal- Thin Teaspoon NT  Pharyngeal --  Pharyngeal- Thin Cup Penetration/Aspiration during swallow;Penetration/Apiration after swallow;Pharyngeal residue - valleculae;Pharyngeal residue - pyriform;Inter-arytenoid space residue;Other (Comment)  Pharyngeal Material enters airway, CONTACTS cords and then ejected out  Pharyngeal- Thin Straw Penetration/Aspiration during swallow;Penetration/Apiration after swallow;Pharyngeal residue - valleculae;Pharyngeal residue - pyriform;Pharyngeal residue - posterior pharnyx;Inter-arytenoid space residue  Pharyngeal Material enters airway, passes BELOW cords then ejected out;Material enters airway, passes BELOW cords without attempt by patient to eject out (silent aspiration)  Pharyngeal- Puree Pharyngeal residue - valleculae;Pharyngeal residue - pyriform;Pharyngeal residue - posterior pharnyx;Inter-arytenoid space residue  Pharyngeal Material does not enter airway  Pharyngeal- Mechanical Soft NT  Pharyngeal --  Pharyngeal- Regular Pharyngeal residue - valleculae;Pharyngeal residue - pyriform  Pharyngeal Material does not enter airway  Pharyngeal- Multi-consistency NT  Pharyngeal --  Pharyngeal- Pill NT  Pharyngeal --  Pharyngeal Comment --     CHL IP CERVICAL ESOPHAGEAL PHASE 01/01/2021  Cervical Esophageal Phase (No Data)  Pudding Teaspoon --  Pudding Cup --  Honey Teaspoon --  Honey Cup --  Nectar Teaspoon --  Nectar Cup --  Nectar Straw --  Thin Teaspoon --  Thin Cup --  Thin Straw --  Puree --  Mechanical Soft --  Regular --  Multi-consistency --  Pill --  Cervical Esophageal Comment --     Houston Siren 01/01/2021, 2:42 PM   Orbie Pyo  Tiye Huwe M.Ed Risk analyst [phone] Office [phone]

## 2021-01-01 NOTE — Discharge Instructions (Signed)

## 2021-01-01 NOTE — Progress Notes (Signed)
NAME:  Garrett Munoz, MRN:  [id number], DOB:  [birth year], LOS: 3 ADMISSION DATE:  12/29/2020, CONSULTATION DATE:  12/29/20 REFERRING MD:  Tamala Julian CHIEF COMPLAINT:  acute hypercarbic respiratory failure    History of Present Illness:  75 yo male with known COPD, pAFib, diastolic heart failure admitted with acute hypercarbic respiratory failure secondary to pneumonia.   Patient follows with Dr [name] for pulmonary outpatient for upper airway cough syndrome and dyspnea on exertion since Nov 2021. Patient presented to ED today for complaints of shortness of breath.  Vital signs on presentation: Temp 100.6, HR 109, RR 22, SpO2 88% on 6 L [place].  He was increased to 10 L [place] with improvement in oxygenation.  Imaging with new right lower lobe consolidation. Patient was started on antibiotics with Azithromycin and Rocephin.  Patient was later found to be unresponsive and ABG revealed hypercarbic respiratory failure with ABG: 7/119/91/34. He required intubation and mechanical ventilation. Work up for infection is in progress. Antibiotics have been broadened to Cefepime.    At time of eval patient remains somnolent saturating in low 90s on BIPAP, wife at bedside, states he has not felt well for past 10 months.  Pertinent  Medical History  Afib COPD Hypertension Pre-diabetes OSA CHF, NYHA class III  Significant Hospital Events: Including procedures, antibiotic start and stop dates in addition to other pertinent events   9/12 Admit to [facility], intubation, central line  Interim History / Subjective:   No acute overnight events. Extubated yesterday without complications. No longer requiring pressor support.   No acute complaints this AM. He states he used to be able to walk approximately 2-3 miles daily up till last summer when he began to hold extra volume.   Objective   Blood pressure 116/72, pulse (!) 103, temperature 98.8 F (37.1 C), temperature source Oral, resp. rate (!) 31, height _0   (1.778 m), weight (!) 149.5 kg, SpO2 94 %.    Vent Mode: PRVC FiO2 (%):  [40 %] 40 % Set Rate:  [24 bmp] 24 bmp Vt Set:  [580 mL] 580 mL PEEP:  [5 cmH20-8 cmH20] 5 cmH20 Plateau Pressure:  [21 cmH20] 21 cmH20   Intake/Output Summary (Last 24 hours) at 01/01/2021 0726 Last data filed at 01/01/2021 0600 Gross per 24 hour  Intake 1677.72 ml  Output 1702 ml  Net -24.28 ml    Filed Weights   12/29/20 1152 12/30/20 0714 12/31/20 0346  Weight: (!) 145.8 kg (!) 149.6 kg (!) 149.5 kg   Examination: General: Elderly male, chronically ill appearing.  HENT: Mucus membranes moist.  Lungs: Bibasilar crackles. No accessory muscle use.  Cardiovascular: Irregularly irregular. No tachycardia. No murmurs, rubs or gallops.  Abdomen: Soft, non-tender Extremities: 1+ pitting edema up to the knees  Neuro: Alert, awake and oriented. No focal deficits.   Resolved Hospital Problem list   Septic Shock (resolved)  Assessment & Plan:   Acute Hypoxic and Hypercarbic Respiratory Failure  Aspiration Pneumonia  Suspected aspiration pneumonia in the setting of chronic dysphagia in addition to pulmonary edema secondary to HFpEF. BAL cultures are negative for abnormal flora. Extubated on 9/14.   - Continue Azithromycin and Ceftriaxone, Day 4 out of 5 - Continue working with SLP - Continue diuresis   HFpEF  Echo obtained on 9/13 with unchanged results. EF of 55-50% with mildly reduced right ventricular systolic function.   - Lasix 80 mg IV once today - Strict in/out - Daily weights   A. Fib  In  RVR today, likely rebound from home Coreg being held. Will restart today and if no improvement, consider Cardiology consult.  - Restart Coreg today  - Restart Eliquis   Pre-diabetes A1c of 5.8%. Hyperglycemic in the setting of critical illness  - SSI  Polymyalgia Rheumatica  - s/p stress dose steroids on 9/12 - Continue home Prednisone   Hypokalemia  Hypophosphatemia  - Replace PRN - Monitor daily    Thrombocytopenia  143 on admission > 104. No signs of bleeding.   - Continue to monitor closely.   Best Practice (right click and "Reselect all SmartList Selections" daily)   Diet/type: Dysphagia 1 DVT prophylaxis: Eliquis GI prophylaxis: PPI Lines: Discontinue central line today  Foley:  N/A Code Status:  full code Last date of multidisciplinary goals of care discussion [Family updated at bedside on 9/15]  Stable for transfer to the floor.   Labs   CBC: Recent Labs  Lab 12/29/20 0839 12/29/20 1714 12/30/20 0247 12/31/20 0419 01/01/21 0334  WBC 13.9*  --  20.4* 13.7* 9.5  NEUTROABS 11.7*  --   --   --   --   HGB 19.0* 17.3* 16.7 15.7 14.5  HCT 58.8* 51.0 50.5 46.8 45.1  MCV 100.9*  --  96.6 95.3 99.1  PLT 143*  --  155 123* 104*     Basic Metabolic Panel: Recent Labs  Lab 12/29/20 0839 12/29/20 1714 12/30/20 0247 12/30/20 1500 12/31/20 0419 12/31/20 1240 01/01/21 0334  NA 140   < > 137 138 138 140 141  K 4.4   < > 4.2 3.0* 2.5* 3.4* 3.6  CL 96*  --  96* 96* 98 100 102  CO2 36*  --  32 33* 32 32 33*  GLUCOSE 88  --  119* 122* 116* 103* 79  BUN 12  --  _0 CREATININE 1.06  --  1.09 1.07 1.02 0.88 0.72  CALCIUM 9.3  --  8.5* 7.9* 8.1* 8.0* 8.2*  MG 1.8  --  1.7  --  2.0  --  2.1  PHOS  --   --  <1.0* 3.2  --   --   --    < > = values in this interval not displayed.    GFR: Estimated Creatinine Clearance: 115.1 mL/min (by C-G formula based on SCr of 0.72 mg/dL). Recent Labs  Lab 12/29/20 0839 12/29/20 1041 12/29/20 1252 12/30/20 0247 12/31/20 0419 01/01/21 0334  PROCALCITON  --   --  <0.10  --   --   --   WBC 13.9*  --   --  20.4* 13.7* 9.5  LATICACIDVEN 1.7 1.3  --   --   --   --      Liver Function Tests: Recent Labs  Lab 12/29/20 0839  AST 20  ALT 29  ALKPHOS 60  BILITOT 0.9  PROT 7.2  ALBUMIN 3.8    No results for input(s): LIPASE, AMYLASE in the last 168 hours. No results for input(s): AMMONIA in the last 168  hours.  ABG    Component Value Date/Time   PHART 7.376 12/29/2020 1714   PCO2ART 54.7 (H) 12/29/2020 1714   PO2ART 123 (H) 12/29/2020 1714   HCO3 32.0 (H) 12/29/2020 1714   TCO2 34 (H) 12/29/2020 1714   O2SAT 99.0 12/29/2020 1714      Coagulation Profile: Recent Labs  Lab 12/29/20 1045  INR 1.0     Cardiac Enzymes: No results for input(s): CKTOTAL, CKMB, CKMBINDEX, TROPONINI  in the last 168 hours.  HbA1C: Hgb A1c MFr Bld  Date/Time Value Ref Range Status  12/30/2020 02:47 AM 5.8 (H) 4.8 - 5.6 % Final    Comment:    (NOTE) Pre diabetes:          5.7%-6.4%  Diabetes:              >6.4%  Glycemic control for   <7.0% adults with diabetes   03/10/2020 04:26 AM 5.8 (H) 4.8 - 5.6 % Final    Comment:    (NOTE) Pre diabetes:          5.7%-6.4%  Diabetes:              >6.4%  Glycemic control for   <7.0% adults with diabetes    CBG: Recent Labs  Lab 12/31/20 1944 12/31/20 2329 01/01/21 0346 01/01/21 0525 01/01/21 0714  GLUCAP 174* 72 55* 95 73    Review of Systems:   Negative except as noted above.   Past Medical History:  He,  has a past medical history of Atrial fibrillation ([place]), Bronchitis, Cataract, CHF NYHA class III, acute on chronic, diastolic (HCC), COPD (chronic obstructive pulmonary disease) ([place]), DDD (degenerative disc disease), lumbosacral, Dysrhythmia, Eye worm, Gout, History of kidney stones, Hypertension, Left knee DJD, Morbid obesity with BMI of 40.0-44.9, adult ([place]), Pneumonia, Prediabetes, Radicular syndrome of left leg, S/P total knee replacement, Sleep apnea, and 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.   Surgical History:   Past Surgical History:  Procedure Laterality Date   CARDIOVERSION N/A 03/04/2015   Procedure: CARDIOVERSION;  Surgeon: [name], MD;  Location: [place];  Service: Cardiovascular;  Laterality: N/A;   CHOLECYSTECTOMY     COLONOSCOPY N/A 01/01/2016   Procedure: COLONOSCOPY;  Surgeon: [name], MD;  [facility];  Service: Endoscopy;  Laterality: N/A;  1:45   COLONOSCOPY N/A 01/14/2017   Procedure: COLONOSCOPY;  Surgeon: [name], MD;  [facility];  Service: Endoscopy;  Laterality: N/A;  12:05   EYE EXAMINATION UNDER ANESTHESIA W/ RETINAL CRYOTHERAPY AND RETINAL LASER  07/2011   EYE SURGERY  02/2011   cat rt   KNEE ARTHROSCOPY     bilateral   LUMBAR LAMINECTOMY/DECOMPRESSION MICRODISCECTOMY N/A 08/18/2017   Procedure: Bilateral microlumbar decompression Lumbar four-Lumbar five;  Surgeon: [name], MD;  Location: [place];  Service: Orthopedics;  Laterality: N/A;   POLYPECTOMY  01/01/2016   Procedure: POLYPECTOMY;  Surgeon: [name], MD;  [facility];  Service: Endoscopy;;  colon   RETINAL DETACHMENT SURGERY  03/2011   rt   TOTAL KNEE ARTHROPLASTY  2003   rt   TOTAL KNEE ARTHROPLASTY  02/07/2012   Procedure: TOTAL KNEE ARTHROPLASTY;  Surgeon: [name], MD;  Location: [place];  Service: Orthopedics;  Laterality: Left;     Social History:   reports that he quit smoking about 28 years ago. His smoking use included cigarettes. He has a 30.00 pack-year smoking history. He has never used smokeless tobacco. He reports current alcohol use. He reports that he does not use drugs.   Family History:  His family history includes Lung cancer in his father.   Allergies No Known Allergies   Home Medications  Prior to Admission medications   Medication Sig Start Date End Date Taking? Authorizing Provider  albuterol (ACCUNEB) 1.25 MG/3ML nebulizer solution Take 1 ampule by nebulization every 4 (four) hours as needed for wheezing.   Yes [provider]  albuterol (VENTOLIN HFA) 108 (90 Base) MCG/ACT inhaler Inhale 2 puffs into the lungs every 6 (six) hours as needed for wheezing or shortness of breath. 07/29/20  Yes [name], MD  apixaban (ELIQUIS) 5 MG TABS tablet TAKE 1 TABLET (5 MG TOTAL) BY MOUTH 2 (TWO) TIMES DAILY. Patient taking  differently: Take 5 mg by mouth 2 (two) times daily. 07/01/20 07/01/21 Yes [name], MD  azelastine (ASTELIN) 0.1 % nasal spray Place 1 spray into both nostrils daily. Use in each nostril as directed Patient taking differently: Place 1 spray into both nostrils daily as needed for rhinitis. 12/26/20  Yes Valentina Shaggy, MD  carvedilol (COREG) 12.5 MG tablet TAKE 1 & 1/2 TABLETS BY MOUTH EVERY MORNING AND 1 TABLET IN THE EVENING Patient taking differently: Take 6.25-12.5 mg by mouth See admin instructions. Take half tablet by mouth in the morning and then take 1 whole tablet by mouth in the evening per wife 04/03/20 04/03/21 Yes [name], MD  fluticasone (FLONASE) 50 MCG/ACT nasal spray SHAKE LIQUID AND USE 2 SPRAYS IN EACH NOSTRIL DAILY Patient taking differently: Place 2 sprays into both nostrils daily as needed for allergies or rhinitis. 04/23/19  Yes [name], MD  furosemide (LASIX) 20 MG tablet Take 1 tablet (20 mg total) by mouth daily. 12/04/20  Yes [name], MD  gabapentin (NEURONTIN) 300 MG capsule TAKE 2 CAPSULES (600 MG TOTAL) BY MOUTH 3 (THREE) TIMES DAILY. Patient taking differently: Take 600 mg by mouth 3 (three) times daily. 04/25/20 04/25/21 Yes PickardCammie Mcgee, MD  losartan (COZAAR) 25 MG tablet TAKE 1 TABLET (25 MG TOTAL) BY MOUTH DAILY. Patient taking differently: Take 25 mg by mouth daily. 06/26/20 06/26/21 Yes PickardCammie Mcgee, MD  montelukast (SINGULAIR) 10 MG tablet Take 1 tablet (10 mg total) by mouth at bedtime. 09/29/20  Yes [name], MD  Multiple Vitamins-Minerals (MULTIVITAMIN WITH MINERALS) tablet Take 1 tablet by mouth daily. 03/12/20 03/12/21 Yes Dwyane Dee, MD  predniSONE (DELTASONE) 10 MG tablet Take 1 tablet (10 mg total) by mouth daily with breakfast. 09/29/20  Yes Rice, Resa Miner, MD  temazepam (RESTORIL) 30 MG capsule Take 1 capsule (30 mg total) by mouth at bedtime as needed for sleep Patient taking differently: Take 30  mg by mouth at bedtime. 09/30/20  Yes [name], MD  allopurinol (ZYLOPRIM) 300 MG tablet TAKE 1 TABLET (300 MG TOTAL) BY MOUTH DAILY. Patient not taking: Reported on 12/29/2020 12/31/19 12/30/20  [name], MD  levocetirizine (XYZAL) 5 MG tablet Take 1 tablet (5 mg total) by mouth every evening. Patient not taking: Reported on 12/29/2020 09/29/20   [name], MD  lidocaine in nystatin suspension Swish & spit with 5 ml's 3 times a day for 5 days then 3 times a day as needed Patient not taking: Reported on 12/29/2020 12/16/20   [name], MD  magic mouthwash w/lidocaine SOLN Swish and spit 51m PO TID x5 days, then PRN- mouth pain. Steroid, Benadryl, Nystatin, Lidocaine 1:1 ratio. Patient not taking: Reported on 12/29/2020 12/16/20   PSusy Frizzle MD  potassium chloride SA (KLOR-CON) 20 MEQ tablet Take 1 tablet (20 mEq total) by mouth daily. Patient not taking: No sig reported 03/26/20 05/26/20  PSusy Frizzle MD   Dr. IJose PersiaInternal Medicine PGY-3 01/01/2021, 7:26 AM

## 2021-01-02 ENCOUNTER — Other Ambulatory Visit (HOSPITAL_COMMUNITY): Payer: Self-pay

## 2021-01-02 ENCOUNTER — Other Ambulatory Visit: Payer: Self-pay | Admitting: Cardiovascular Disease

## 2021-01-02 ENCOUNTER — Other Ambulatory Visit: Payer: Self-pay | Admitting: Family Medicine

## 2021-01-02 DIAGNOSIS — J9602 Acute respiratory failure with hypercapnia: Secondary | ICD-10-CM | POA: Diagnosis not present

## 2021-01-02 DIAGNOSIS — J9601 Acute respiratory failure with hypoxia: Secondary | ICD-10-CM | POA: Diagnosis not present

## 2021-01-02 LAB — PANEL 606648
E101-IgE Can f 1: <0.1 kU/L
E102-IgE Can f 2: <0.1 kU/L
E221-IgE Can f 3: <0.1 kU/L
E226-IgE Can f 5: <0.1 kU/L

## 2021-01-02 LAB — ALLERGENS W/COMP RFLX AREA 2
Alternaria Alternata IgE: <0.1 kU/L
Aspergillus Fumigatus IgE: 0.15 kU/L — AB
Bermuda Grass IgE: <0.1 kU/L
Cedar, Mountain IgE: 0.15 kU/L — AB
Cladosporium Herbarum IgE: 0.14 kU/L — AB
Cockroach, German IgE: 0.16 kU/L — AB
Common Silver Birch IgE: <0.1 kU/L
Cottonwood IgE: <0.1 kU/L
D Farinae IgE: <0.1 kU/L
D Pteronyssinus IgE: <0.1 kU/L
E001-IgE Cat Dander: 0.34 kU/L — AB
E005-IgE Dog Dander: 0.64 kU/L — AB
Elm, American IgE: <0.1 kU/L
IgE (Immunoglobulin E), Serum: 647 IU/mL — ABNORMAL HIGH (ref 6–495)
Johnson Grass IgE: <0.1 kU/L
Maple/Box Elder IgE: <0.1 kU/L
Mouse Urine IgE: <0.1 kU/L
Oak, White IgE: <0.1 kU/L
Pecan, Hickory IgE: <0.1 kU/L
Penicillium Chrysogen IgE: <0.1 kU/L
Pigweed, Rough IgE: <0.1 kU/L
Ragweed, Short IgE: <0.1 kU/L
Sheep Sorrel IgE Qn: <0.1 kU/L
Timothy Grass IgE: <0.1 kU/L
White Mulberry IgE: <0.1 kU/L

## 2021-01-02 LAB — BASIC METABOLIC PANEL
Anion gap: 5 (ref 5–15)
BUN: 11 mg/dL (ref 8–23)
CO2: 37 mmol/L — ABNORMAL HIGH (ref 22–32)
Calcium: 8.8 mg/dL — ABNORMAL LOW (ref 8.9–10.3)
Chloride: 98 mmol/L (ref 98–111)
Creatinine, Ser: 0.87 mg/dL (ref 0.61–1.24)
GFR, Estimated: >60 mL/min (ref >60)
Glucose, Bld: 103 mg/dL — ABNORMAL HIGH (ref 70–99)
Potassium: 5.4 mmol/L — ABNORMAL HIGH (ref 3.5–5.1)
Sodium: 140 mmol/L (ref 135–145)

## 2021-01-02 LAB — GLUCOSE, CAPILLARY
Glucose-Capillary: 100 mg/dL — ABNORMAL HIGH (ref 70–99)
Glucose-Capillary: 111 mg/dL — ABNORMAL HIGH (ref 70–99)
Glucose-Capillary: 94 mg/dL (ref 70–99)
Glucose-Capillary: 85 mg/dL (ref 70–99)
Glucose-Capillary: 152 mg/dL — ABNORMAL HIGH (ref 70–99)
Glucose-Capillary: 109 mg/dL — ABNORMAL HIGH (ref 70–99)

## 2021-01-02 LAB — CBC
HCT: 47.3 % (ref 39.0–52.0)
Hemoglobin: 14.9 g/dL (ref 13.0–17.0)
MCH: 31.7 pg (ref 26.0–34.0)
MCHC: 31.5 g/dL (ref 30.0–36.0)
MCV: 100.6 fL — ABNORMAL HIGH (ref 80.0–100.0)
Platelets: 122 10*3/uL — ABNORMAL LOW (ref 150–400)
RBC: 4.7 MIL/uL (ref 4.22–5.81)
RDW: 14.2 % (ref 11.5–15.5)
WBC: 7.5 10*3/uL (ref 4.0–10.5)
nRBC: 0 % (ref 0.0–0.2)

## 2021-01-02 LAB — MAGNESIUM: Magnesium: 1.9 mg/dL (ref 1.7–2.4)

## 2021-01-02 LAB — ALLERGEN COMPONENT COMMENTS

## 2021-01-02 MED ORDER — FUROSEMIDE 10 MG/ML IJ SOLN
40.0000 mg | Freq: Once | INTRAMUSCULAR | Status: AC
  Administered 2021-01-02: 40 mg via INTRAVENOUS
  Filled 2021-01-02: qty 4

## 2021-01-02 MED ORDER — LOPERAMIDE HCL 2 MG PO CAPS
2.0000 mg | ORAL_CAPSULE | ORAL | Status: DC | PRN
  Administered 2021-01-03 – 2021-01-04 (×5): 2 mg via ORAL
  Filled 2021-01-02 (×5): qty 1

## 2021-01-02 MED ORDER — FOOD THICKENER (SIMPLYTHICK HONEY): 1.0000 | ORAL | Status: DC | PRN

## 2021-01-02 MED FILL — Gabapentin Cap 300 MG: ORAL | 90 days supply | Qty: 540 | Fill #2 | Status: AC

## 2021-01-02 MED FILL — Carvedilol Tab 12.5 MG: ORAL | 90 days supply | Qty: 225 | Fill #1 | Status: AC

## 2021-01-02 NOTE — Progress Notes (Signed)
Occupational Therapy Treatment Patient Details Name: Garrett Munoz MRN: [id number] DOB: 1945/11/15 Today's Date: 01/02/2021   History of present illness 75 yo male admitted 9/12 with acute hypercarbic respiratory failure secondary to pneumonia.  Pt intubated 9/12- 9/14. PMhx:COPD, AFib, diastolic heart failure, OSA, gout, TKA   OT comments  Patient with nice progress toward all goal areas.  He looks much brighter and responsive today.  Bed mobility has improved to supervision, and sit to stand at [place] is supervision as well.  He was able to participate with edge of bed sitting upper body HEP, and required only Min A to adjust his socks. OT will continue to follow and assist with an eventual return home with assist as needed from his spouse.     Recommendations for follow up therapy are one component of a multi-disciplinary discharge planning process, led by the attending physician.  Recommendations may be updated based on patient status, additional functional criteria and insurance authorization.    Follow Up Recommendations  No OT follow up    Equipment Recommendations  None recommended by OT    Recommendations for Other Services      Precautions / Restrictions Precautions Precautions: Fall Restrictions Weight Bearing Restrictions: No       Mobility Bed Mobility   Bed Mobility: Supine to Sit;Sit to Supine     Supine to sit: HOB elevated;Supervision Sit to supine: Supervision        Transfers Overall transfer level: Needs assistance   Transfers: Sit to/from Stand Sit to Stand: Supervision              Balance Overall balance assessment: Needs assistance Sitting-balance support: No upper extremity supported;Feet supported Sitting balance-Leahy Scale: Good     Standing balance support: Bilateral upper extremity supported Standing balance-Leahy Scale: Fair Standing balance comment: able to static stand without RW                           ADL  either performed or assessed with clinical judgement   ADL       Grooming: Wash/dry hands;Wash/dry face;Set up;Sitting               Lower Body Dressing: Sit to/from stand;Minimal assistance               Functional mobility during ADLs: Supervision/safety;Rolling walker                                                                         Exercises General Exercises - Upper Extremity Shoulder Flexion: AROM;Both;20 reps Shoulder Extension: AROM;Both;20 reps Shoulder ABduction: AROM;Both;10 reps Shoulder ADduction: AROM;Both;10 reps Elbow Flexion: AROM;Both;20 reps Elbow Extension: AROM;Both;20 reps Other Exercises Other Exercises: sit to stand times 8 with supervision.   Shoulder Instructions       General Comments      Pertinent Vitals/ Pain       Pain Assessment: No/denies pain Faces Pain Scale: No hurt  Frequency  Min 2X/week        Progress Toward Goals  OT Goals(current goals can now be found in the care plan section)     Acute Rehab OT Goals Patient Stated Goal: To go home OT Goal Formulation: With patient Time For Goal Achievement: 01/14/21 Potential to Achieve Goals: Good  Plan Discharge plan remains appropriate    Co-evaluation                 AM-PAC OT "6 Clicks" Daily Activity     Outcome Measure   Help from another person eating meals?: None Help from another person taking care of personal grooming?: None Help from another person toileting, which includes using toliet, bedpan, or urinal?: A Little Help from another person bathing (including washing, rinsing, drying)?: A Little Help from another person to put on and taking off regular upper body clothing?: None Help from another person to put on and taking off regular lower body clothing?: A Little 6 Click Score: 21    End of Session    OT Visit  Diagnosis: Unsteadiness on feet (R26.81)   Activity Tolerance Patient tolerated treatment well   Patient Left in bed;with call bell/phone within reach;with nursing/sitter in room   Nurse Communication          Time: [id number] OT Time Calculation (min): 18 min  Charges: OT General Charges $OT Visit: 1 Visit OT Treatments $Self Care/Home Management : 8-22 mins  01/02/2021  RP, OTR/L  Acute Rehabilitation Services  Office:  [phone]   Metta Clines 01/02/2021, 1:49 PM

## 2021-01-02 NOTE — Telephone Encounter (Signed)
Ok to refill??  Last office visit 07/29/2020.  Last refill 09/30/2020, #2 refills.

## 2021-01-02 NOTE — Progress Notes (Signed)
Speech Language Pathology Treatment: Dysphagia  Patient Details Name: Garrett Munoz MRN: [id number] DOB: [birth year] Today's Date: 01/02/2021 Time: 0950-1008 SLP Time Calculation (min) (ACUTE ONLY): 18 min  Assessment / Plan / Recommendation Clinical Impression  Pt upright and alert in chair this am with wife present for skilled dysphagia treatment. Reviewed results and recommendations of instrumental swallow assessment yesterday (01/01/21), including swallow strategies to reduce risk for aspiration and increase swallow efficiency. With cup sips of HTL, pt took small sips, cleared throat, followed by repeat dry swallow across x6 trials given min-mod verbal/visual cues. No overt s/sx of aspiration observed. Dysphagia 3 solids also consumed without overt s/sx of aspiration with use of above strategies. Overall, PO consumption completed with good endurance and no significant change in vitals. Pt, wife and RN report doing well with PO consumption during functional meal times. SLP will continue f/u.   HPI HPI: Pt is a 75 y.o. male presented to ED for complaints of shortness of breath. Chest xray (9/12) significant for "patchy and confluent opacity in the right mid and lower lung is nonspecific but suggestive of multilobar pneumonia".   Pt later found unresponsive and ABG revealed hypercarbic respiratory failure. Intubated 9/12- 9/14. Aspiration PNA suspected in setting of chronic dysphagia per MD note 9/14. Pt has been seen by ENT previously with recent Esophagram (12/17/20) revealing mild dysmotility, small hiatal hernia. Also noted was "no gross swallow dysfunction aside from mild penetration".  Wife and daughter reported frequent coughing after meals over an extended period. PMH: COPD, HTN, pre-diabetes, OSA, pAFib, diastolic heart failure, followed by pulmonary OP for upper airway cough syndrome and dyspnea on exertion since Nov 2021.      SLP Plan  Continue with current plan of care       Recommendations for follow up therapy are one component of a multi-disciplinary discharge planning process, led by the attending physician.  Recommendations may be updated based on patient status, additional functional criteria and insurance authorization.    Recommendations  Diet recommendations: Dysphagia 3 (mechanical soft);Honey-thick liquid Liquids provided via: Cup Medication Administration: Crushed with puree Supervision: Patient able to self feed;Full supervision/cueing for compensatory strategies Compensations: Clear throat intermittently;Multiple dry swallows after each bite/sip;Follow solids with liquid;Slow rate;Small sips/bites Postural Changes and/or Swallow Maneuvers: Seated upright 90 degrees                Oral Care Recommendations: Oral care BID Follow up Recommendations: Other (comment) (TBD) SLP Visit Diagnosis: Dysphagia, pharyngoesophageal phase (R13.14) Plan: Continue with current plan of care       [place], [place], [place] Office Number: [phone]  Garrett Munoz  01/02/2021, 10:13 AM

## 2021-01-02 NOTE — [place] Notes (Addendum)
PROGRESS NOTE    Garrett Munoz  WGN:[id number] DOB: 09/26/1945 DOA: 12/29/2020 PCP: [name], MD   Brief Narrative:  75 yo male with known COPD, pAFib, diastolic heart failure admitted with acute hypercarbic respiratory failure secondary to pneumonia.   Patient follows with Dr [name] for pulmonary outpatient for upper airway cough syndrome and dyspnea on exertion since Nov 2021. Patient presented to ED today for complaints of shortness of breath.  Vital signs on presentation: Temp 100.6, HR 109, RR 22, SpO2 88% on 6 L [place].  He was increased to 10 L [place] with improvement in oxygenation.  Imaging with new right lower lobe consolidation. Patient was started on antibiotics with Azithromycin and Rocephin.  Patient was later found to be unresponsive and ABG revealed hypercarbic respiratory failure with ABG: 7/119/91/34. He required intubation and mechanical ventilation. Work up for infection is in progress. Antibiotics have been broadened to Cefepime.    At time of eval patient remains somnolent saturating in low 90s on BIPAP, wife at bedside, states he has not felt well for past 10 months.  Assessment & Plan:   Principal Problem:   Acute respiratory failure with hypoxia and hypercapnia (HCC) Active Problems:   Morbid obesity ([place])   Persistent atrial fibrillation (HCC)   Acute metabolic encephalopathy   CHF NYHA class III, acute on chronic, diastolic (HCC)   Polymyalgia rheumatica (HCC)   Sepsis due to pneumonia ([place])   Respiratory failure ([place])  Acute hypoxic and hypercapnic respiratory failure secondary to aspiration pneumonia: Intubated 12/29/2020, extubated 12/31/2020.  Currently weaned to room air.  Continue antibiotics for total of 5 days.  Acute on chronic diastolic congestive heart failure: Echo completed on 12/30/2020 shows ejection fraction of 55 to 50%, no different than previous echo.  Has had significant diuresis since yesterday.  We will give him another dose of Lasix 40 mg  IV.  Permanent atrial fibrillation: Rates fairly controlled.  Continue Coreg and Eliquis.  Hyperkalemia: 5.4.  Lasix should help.  Essential hypertension: Controlled, continue losartan.  Hypophosphatemia: Resolved.  Acute thrombocytopenia: Likely secondary to acute illness.  No signs of bleeding.  Platelets improving.  Prediabetes: Continue SSI.  Generalized deconditioning/debility: Consult PT OT.  Remove Foley catheter.  DVT prophylaxis: SCDs Start: 12/29/20 1536   Code Status: Full Code  Family Communication: Wife present at bedside.  Plan of care discussed with patient in length and he verbalized understanding and agreed with it.  Status is: Inpatient  Remains inpatient appropriate because:Inpatient level of care appropriate due to severity of illness  Dispo: The patient is from: Home              Anticipated d/c is to: Home              Patient currently is not medically stable to d/c.   Difficult to place patient No        Estimated body mass index is 46.18 kg/m as calculated from the following:   Height as of this encounter: _0  (1.778 m).   Weight as of this encounter: 146 kg.     Nutritional Assessment: Body mass index is 46.18 kg/m.[name] Seen by dietician.  I agree with the assessment and plan as outlined below: Nutrition Status: Nutrition Problem: Inadequate oral intake Etiology: inability to eat Signs/Symptoms: NPO status Interventions: Tube feeding, Prostat  .  Skin Assessment: I have examined the patient's skin and I agree with the wound assessment as performed by the wound care RN as outlined  below:    Consultants:  None  Procedures:  Intubation  Antimicrobials:  Anti-infectives (From admission, onward)    Start     Dose/Rate Route Frequency Ordered Stop   01/02/21 1000  azithromycin (ZITHROMAX) tablet 500 mg        500 mg Oral Daily 01/01/21 1040 01/02/21 0915   01/01/21 0900  amoxicillin-clavulanate (AUGMENTIN) 875-125 MG per tablet  1 tablet        1 tablet Oral Every 12 hours 01/01/21 0819 01/04/21 0859   12/30/20 0900  cefTRIAXone (ROCEPHIN) 2 g in sodium chloride 0.9 % 100 mL IVPB  Status:  Discontinued        2 g 200 mL/hr over 30 Minutes Intravenous Every 24 hours 12/29/20 1656 01/01/21 0819   12/29/20 1600  ceFEPIme (MAXIPIME) 2 g in sodium chloride 0.9 % 100 mL IVPB  Status:  Discontinued        2 g 200 mL/hr over 30 Minutes Intravenous Every 8 hours 12/29/20 1550 12/29/20 1656   12/29/20 0900  cefTRIAXone (ROCEPHIN) 2 g in sodium chloride 0.9 % 100 mL IVPB  Status:  Discontinued        2 g 200 mL/hr over 30 Minutes Intravenous Every 24 hours 12/29/20 0852 12/29/20 1550   12/29/20 0900  azithromycin (ZITHROMAX) 500 mg in sodium chloride 0.9 % 250 mL IVPB  Status:  Discontinued        500 mg 250 mL/hr over 60 Minutes Intravenous Every 24 hours 12/29/20 0852 01/01/21 1040          Subjective: Patient seen and examined with wife at the bedside.  He is fully alert and oriented.  Denies any complaint or any shortness of breath.  Objective: Vitals:   01/02/21 0348 01/02/21 0400 01/02/21 0500 01/02/21 0700  BP:  110/64 114/71   Pulse:  92 96   Resp:  16    Temp: 97.7 F (36.5 C)   98 F (36.7 C)  TempSrc: Oral   Oral  SpO2:  94% 95%   Weight:   (!) 146 kg   Height:        Intake/Output Summary (Last 24 hours) at 01/02/2021 1502 Last data filed at 01/02/2021 0600 Gross per 24 hour  Intake 400 ml  Output 1450 ml  Net -1050 ml   Filed Weights   12/30/20 0714 12/31/20 0346 01/02/21 0500  Weight: (!) 149.6 kg (!) 149.5 kg (!) 146 kg    Examination:  General exam: Appears calm and comfortable  Respiratory system: Bibasilar crackles. Respiratory effort normal. Cardiovascular system: S1 & S2 heard, RRR. No JVD, murmurs, rubs, gallops or clicks.  Trace pitting edema bilateral lower extremity. Gastrointestinal system: Abdomen is nondistended, soft and nontender. No organomegaly or masses felt. Normal  bowel sounds heard. Central nervous system: Alert and oriented. No focal neurological deficits. Extremities: Symmetric 5 x 5 power. Skin: No rashes, lesions or ulcers Psychiatry: Judgement and insight appear normal. Mood & affect appropriate.    Data Reviewed: I have personally reviewed following labs and imaging studies  CBC: Recent Labs  Lab 12/29/20 0839 12/29/20 1714 12/30/20 0247 12/31/20 0419 01/01/21 0334 01/02/21 0331  WBC 13.9*  --  20.4* 13.7* 9.5 7.5  NEUTROABS 11.7*  --   --   --   --   --   HGB 19.0* 17.3* 16.7 15.7 14.5 14.9  HCT 58.8* 51.0 50.5 46.8 45.1 47.3  MCV 100.9*  --  96.6 95.3 99.1 100.6*  PLT 143*  --  155 123* 104* 858*   Basic Metabolic Panel: Recent Labs  Lab 12/29/20 0839 12/29/20 1714 12/30/20 0247 12/30/20 1500 12/31/20 0419 12/31/20 1240 01/01/21 0334 01/02/21 0331  NA 140   < > 137 138 138 140 141 140  K 4.4   < > 4.2 3.0* 2.5* 3.4* 3.6 5.4*  CL 96*  --  96* 96* 98 100 102 98  CO2 36*  --  32 33* 32 32 33* 37*  GLUCOSE 88  --  119* 122* 116* 103* 79 103*  BUN 12  --  _0 CREATININE 1.06  --  1.09 1.07 1.02 0.88 0.72 0.87  CALCIUM 9.3  --  8.5* 7.9* 8.1* 8.0* 8.2* 8.8*  MG 1.8  --  1.7  --  2.0  --  2.1 1.9  PHOS  --   --  <1.0* 3.2  --   --   --   --    < > = values in this interval not displayed.   GFR: Estimated Creatinine Clearance: 104.4 mL/min (by C-G formula based on SCr of 0.87 mg/dL). Liver Function Tests: Recent Labs  Lab 12/29/20 0839  AST 20  ALT 29  ALKPHOS 60  BILITOT 0.9  PROT 7.2  ALBUMIN 3.8   No results for input(s): LIPASE, AMYLASE in the last 168 hours. No results for input(s): AMMONIA in the last 168 hours. Coagulation Profile: Recent Labs  Lab 12/29/20 1045  INR 1.0   Cardiac Enzymes: No results for input(s): CKTOTAL, CKMB, CKMBINDEX, TROPONINI in the last 168 hours. BNP (last 3 results) No results for input(s): PROBNP in the last 8760 hours. HbA1C: No results for input(s):  HGBA1C in the last 72 hours. CBG: Recent Labs  Lab 01/01/21 2002 01/01/21 2325 01/02/21 0316 01/02/21 0724 01/02/21 1215  GLUCAP 83 105* 100* 111* 94   Lipid Profile: No results for input(s): CHOL, HDL, LDLCALC, TRIG, CHOLHDL, LDLDIRECT in the last 72 hours. Thyroid Function Tests: No results for input(s): TSH, T4TOTAL, FREET4, T3FREE, THYROIDAB in the last 72 hours. Anemia Panel: No results for input(s): VITAMINB12, FOLATE, FERRITIN, TIBC, IRON, RETICCTPCT in the last 72 hours. Sepsis Labs: Recent Labs  Lab 12/29/20 0839 12/29/20 1041 12/29/20 1252  PROCALCITON  --   --  <0.10  LATICACIDVEN 1.7 1.3  --     Recent Results (from the past 240 hour(s))  Blood Culture (routine x 2)     Status: None (Preliminary result)   Collection Time: 12/29/20  8:39 AM   Specimen: BLOOD  Result Value Ref Range Status   Specimen Description BLOOD RIGHT ANTECUBITAL  Final   Special Requests   Final    BOTTLES DRAWN AEROBIC AND ANAEROBIC Blood Culture results may not be optimal due to an excessive volume of blood received in culture bottles   Culture   Final    NO GROWTH 4 DAYS Performed at [facility] [address], [place], [place] [address]    Report Status PENDING  Incomplete  Blood Culture (routine x 2)     Status: None (Preliminary result)   Collection Time: 12/29/20  8:51 AM   Specimen: BLOOD  Result Value Ref Range Status   Specimen Description BLOOD SITE NOT SPECIFIED  Final   Special Requests   Final    BOTTLES DRAWN AEROBIC AND ANAEROBIC Blood Culture adequate volume   Culture   Final    NO GROWTH 4 DAYS Performed at [facility], [address] [place], [place] [address]    Report Status PENDING  Incomplete  Resp Panel by RT-PCR (Flu A&B, Covid) Nasopharyngeal Swab     Status: None   Collection Time: 12/29/20  9:11 AM   Specimen: Nasopharyngeal Swab; Nasopharyngeal(NP) swabs in vial transport medium  Result Value Ref Range Status   SARS Coronavirus 2 by  RT PCR NEGATIVE NEGATIVE Final    Comment: (NOTE) SARS-CoV-2 target nucleic acids are NOT DETECTED.  The SARS-CoV-2 RNA is generally detectable in upper respiratory specimens during the acute phase of infection. The lowest concentration of SARS-CoV-2 viral copies this assay can detect is 138 copies/mL. A negative result does not preclude SARS-Cov-2 infection and should not be used as the sole basis for treatment or other patient management decisions. A negative result may occur with  improper specimen collection/handling, submission of specimen other than nasopharyngeal swab, presence of viral mutation(s) within the areas targeted by this assay, and inadequate number of viral copies(<138 copies/mL). A negative result must be combined with clinical observations, patient history, and epidemiological information. The expected result is Negative.  Fact Sheet for Patients:  EntrepreneurPulse.com.au  Fact Sheet for Healthcare Providers:  IncredibleEmployment.be  This test is no t yet approved or cleared by the [place] FDA and  has been authorized for detection and/or diagnosis of SARS-CoV-2 by FDA under an Emergency Use Authorization (EUA). This EUA will remain  in effect (meaning this test can be used) for the duration of the COVID-19 declaration under Section 564(b)(1) of the Act, 21 U.S.C.section 360bbb-3(b)(1), unless the authorization is terminated  or revoked sooner.       Influenza A by PCR NEGATIVE NEGATIVE Final   Influenza B by PCR NEGATIVE NEGATIVE Final    Comment: (NOTE) The Xpert Xpress SARS-CoV-2/FLU/RSV plus assay is intended as an aid in the diagnosis of influenza from Nasopharyngeal swab specimens and should not be used as a sole basis for treatment. Nasal washings and aspirates are unacceptable for Xpert Xpress SARS-CoV-2/FLU/RSV testing.  Fact Sheet for Patients: EntrepreneurPulse.com.au  Fact Sheet  for Healthcare Providers: IncredibleEmployment.be  This test is not yet approved or cleared by the [place] FDA and has been authorized for detection and/or diagnosis of SARS-CoV-2 by FDA under an Emergency Use Authorization (EUA). This EUA will remain in effect (meaning this test can be used) for the duration of the COVID-19 declaration under Section 564(b)(1) of the Act, 21 U.S.C. section 360bbb-3(b)(1), unless the authorization is terminated or revoked.  Performed at [facility], [place] [address], [place], [place] [address]   Urine Culture     Status: None   Collection Time: 12/29/20 10:41 AM   Specimen: Urine, Clean Catch  Result Value Ref Range Status   Specimen Description URINE, CLEAN CATCH  Final   Special Requests Normal  Final   Culture   Final    NO GROWTH Performed at [facility], [place] [address] [place], [place] [address]    Report Status 12/30/2020 FINAL  Final  Culture, Respiratory w Gram Stain     Status: None   Collection Time: 12/29/20  4:37 PM   Specimen: Bronchoalveolar Lavage; Respiratory  Result Value Ref Range Status   Specimen Description BRONCHIAL ALVEOLAR LAVAGE  Final   Special Requests NONE  Final   Gram Stain   Final    FEW SQUAMOUS EPITHELIAL CELLS PRESENT MODERATE WBC PRESENT,BOTH PMN AND MONONUCLEAR FEW GRAM POSITIVE COCCI    Culture   Final  FEW Normal respiratory flora-no Staph aureus or Pseudomonas seen Performed at [facility] [address] [place], [place] [address]    Report Status 01/01/2021 FINAL  Final  MRSA Next Gen by PCR, Nasal     Status: None   Collection Time: 12/29/20  7:00 PM   Specimen: Nasal Mucosa; Nasal Swab  Result Value Ref Range Status   MRSA by PCR Next Gen NOT DETECTED NOT DETECTED Final    Comment: (NOTE) The GeneXpert MRSA Assay (FDA approved for NASAL specimens only), is one component of a comprehensive MRSA colonization surveillance program. It is not intended  to diagnose MRSA infection nor to guide or monitor treatment for MRSA infections. Test performance is not FDA approved in patients less than 6 years old. Performed at [facility], [place] [address], [place], [place] [address]       Radiology Studies: No results found.  Scheduled Meds:  amoxicillin-clavulanate  1 tablet Oral Q12H   apixaban  5 mg Oral BID   calcium citrate  200 mg of elemental calcium Oral Daily   carvedilol  6.25 mg Oral BID WC   chlorhexidine gluconate (MEDLINE KIT)  15 mL Mouth Rinse BID   Chlorhexidine Gluconate Cloth  6 each Topical Daily   cholecalciferol  500 Units Oral Daily   docusate  100 mg Oral BID   guaiFENesin  300 mg Oral Q6H   losartan  25 mg Oral Daily   polyethylene glycol  17 g Oral Daily   predniSONE  10 mg Oral Q breakfast   Continuous Infusions:   LOS: 4 days   Time spent: 35 minutes   Darliss Cheney, MD [facility]  01/02/2021, 3:02 PM  Please page via [place] and do not message via secure chat for anything urgent. Secure chat can be used for anything non urgent.  How to contact the [facility] Attending or Consulting provider [place] or covering provider during after hours [place], for this patient?  Check the care team in [facility] and look for a) attending/consulting TRH provider listed and b) the [facility] team listed. Page or secure chat [phone]. Log into [email or web address] and use [place]'s universal password to access. If you do not have the password, please contact the hospital operator. Locate the [facility] provider you are looking for under [facility] and page to a number that you can be directly reached. If you still have difficulty reaching the provider, please page the [facility] (Director on Call) for the Hospitalists listed on amion for assistance.

## 2021-01-03 ENCOUNTER — Inpatient Hospital Stay (HOSPITAL_COMMUNITY): Payer: PPO

## 2021-01-03 DIAGNOSIS — J9601 Acute respiratory failure with hypoxia: Secondary | ICD-10-CM | POA: Diagnosis not present

## 2021-01-03 DIAGNOSIS — J9602 Acute respiratory failure with hypercapnia: Secondary | ICD-10-CM | POA: Diagnosis not present

## 2021-01-03 LAB — CBC
HCT: 48 % (ref 39.0–52.0)
Hemoglobin: 15.5 g/dL (ref 13.0–17.0)
MCH: 32 pg (ref 26.0–34.0)
MCHC: 32.3 g/dL (ref 30.0–36.0)
MCV: 99 fL (ref 80.0–100.0)
Platelets: 128 10*3/uL — ABNORMAL LOW (ref 150–400)
RBC: 4.85 MIL/uL (ref 4.22–5.81)
RDW: 13.8 % (ref 11.5–15.5)
WBC: 10.1 10*3/uL (ref 4.0–10.5)
nRBC: 0 % (ref 0.0–0.2)

## 2021-01-03 LAB — BASIC METABOLIC PANEL
Anion gap: 11 (ref 5–15)
BUN: 14 mg/dL (ref 8–23)
CO2: 37 mmol/L — ABNORMAL HIGH (ref 22–32)
Calcium: 9.4 mg/dL (ref 8.9–10.3)
Chloride: 94 mmol/L — ABNORMAL LOW (ref 98–111)
Creatinine, Ser: 0.88 mg/dL (ref 0.61–1.24)
GFR, Estimated: >60 mL/min (ref >60)
Glucose, Bld: 118 mg/dL — ABNORMAL HIGH (ref 70–99)
Potassium: 4 mmol/L (ref 3.5–5.1)
Sodium: 142 mmol/L (ref 135–145)

## 2021-01-03 LAB — GLUCOSE, CAPILLARY
Glucose-Capillary: 111 mg/dL — ABNORMAL HIGH (ref 70–99)
Glucose-Capillary: 105 mg/dL — ABNORMAL HIGH (ref 70–99)
Glucose-Capillary: 123 mg/dL — ABNORMAL HIGH (ref 70–99)

## 2021-01-03 LAB — CULTURE, BLOOD (ROUTINE X 2)
Culture: NO GROWTH
Culture: NO GROWTH
Special Requests: ADEQUATE

## 2021-01-03 LAB — MAGNESIUM: Magnesium: 1.9 mg/dL (ref 1.7–2.4)

## 2021-01-03 MED ORDER — FUROSEMIDE 10 MG/ML IJ SOLN
40.0000 mg | Freq: Two times a day (BID) | INTRAMUSCULAR | Status: DC
  Administered 2021-01-03 – 2021-01-04 (×3): 40 mg via INTRAVENOUS
  Filled 2021-01-03 (×3): qty 4

## 2021-01-03 NOTE — [place] Notes (Signed)
Patient noted to have drops in O2 saturation when sleeping. Patient placed with venturi mask at 40% however patients O2 saturation will still drop to 84%. Patient noted by writer to have sleep apnea. RT notified and observed patients dips in O2 saturation due to apnea when sleeping. RT will bring up C-pap for patient to wear tonight. Post note states wife mentions patient not tolerating c-pap at night however patient states willing to try one when asked.

## 2021-01-03 NOTE — [place] Notes (Signed)
Occupational Therapy Treatment Patient Details Name: MARSH BEDOY MRN: [id number] DOB: February 05, 1946 Today's Date: 01/03/2021   History of present illness 75 yo male admitted 9/12 with acute hypercarbic respiratory failure secondary to pneumonia.  Pt intubated 9/12- 9/14. PMhx:COPD, AFib, diastolic heart failure, OSA, gout, TKA   OT comments  Patient with good progress toward patient focused goals.  Patient at Mod I level with bed mobility, requesting mobility in the halls, but needing to use the restroom.  Patient supervision for mobility to toilet/BSC placed in bathroom.  Needing supervision with transfer and hygiene.  Able to walk in the hall up to 150' with no c/o SOB, but HR to 144 at RW level.  Patient is very motivated, and wants to walk more.  OT initial followed with the recliner, but he did not need any rest breaks.  OT to continue to follow in the acute setting, but d/c home when medically cleared is recommended.     Recommendations for follow up therapy are one component of a multi-disciplinary discharge planning process, led by the attending physician.  Recommendations may be updated based on patient status, additional functional criteria and insurance authorization.    Follow Up Recommendations  No OT follow up    Equipment Recommendations  None recommended by OT    Recommendations for Other Services      Precautions / Restrictions Precautions Precautions: Fall Precaution Comments: watch O2 and BP. Restrictions Weight Bearing Restrictions: No       Mobility Bed Mobility Overal bed mobility: Modified Independent               Patient Response: Cooperative  Transfers Overall transfer level: Needs assistance   Transfers: Sit to/from Stand;Stand Pivot Transfers Sit to Stand: Supervision Stand pivot transfers: Supervision            Balance     Sitting balance-Leahy Scale: Normal     Standing balance support: Bilateral upper extremity  supported Standing balance-Leahy Scale: Fair Standing balance comment: able to static stand without RW                           ADL either performed or assessed with clinical judgement   ADL       Grooming: Wash/dry hands;Wash/dry face;Set up;Bed level Grooming Details (indicate cue type and reason): long sitting     Lower Body Bathing: Minimal assistance;Sit to/from stand       Lower Body Dressing: Sit to/from stand;Minimal assistance   Toilet Transfer: Supervision/safety;RW;Regular Glass blower/designer Details (indicate cue type and reason): BSC over toilet Toileting- Clothing Manipulation and Hygiene: Supervision/safety;Sit to/from stand       Functional mobility during ADLs: Supervision/safety;Rolling walker                                                                                     General Comments HR up to 144 with mobility.  O2 steady at 93% on 3L    Pertinent Vitals/ Pain  No c/o pain.  O2 93% on 3 L and HR to 144 with mobility  Frequency  Min 2X/week        [place] Toward Goals  OT Goals(current goals can now be found in the care plan section)  Progress towards OT goals: Progressing toward goals  Acute Rehab OT Goals Patient Stated Goal: To go home OT Goal Formulation: With patient Time For Goal Achievement: 01/17/21 Potential to Achieve Goals: Good ADL Goals Pt Will Perform Grooming: sitting;with set-up Pt Will Perform Lower Body Bathing: with supervision;sit to/from stand Pt Will Perform Lower Body Dressing: with set-up;sit to/from stand Pt Will Transfer to Toilet: with modified independence;ambulating;regular height toilet Pt Will Perform Toileting - Clothing Manipulation and hygiene: Independently;sit to/from stand Pt/caregiver will Perform Home Exercise Program: Increased strength;Both right and  left upper extremity;With theraband;Independently;With written HEP provided  Plan Discharge plan remains appropriate    Co-evaluation                 AM-PAC OT "6 Clicks" Daily Activity     Outcome Measure   Help from another person eating meals?: None Help from another person taking care of personal grooming?: None Help from another person toileting, which includes using toliet, bedpan, or urinal?: A Little Help from another person bathing (including washing, rinsing, drying)?: A Little Help from another person to put on and taking off regular upper body clothing?: None Help from another person to put on and taking off regular lower body clothing?: A Little 6 Click Score: 21    End of Session Equipment Utilized During Treatment: Oxygen;Gait belt;Rolling walker  OT Visit Diagnosis: Unsteadiness on feet (R26.81)   Activity Tolerance Patient tolerated treatment well   Patient Left in bed;with call bell/phone within reach   Nurse Communication          Time: [id number] OT Time Calculation (min): 24 min  Charges: OT General Charges $OT Visit: 1 Visit OT Treatments $Self Care/Home Management : 23-37 mins  01/03/2021  RP, OTR/L  Acute Rehabilitation Services  Office:  [phone]   Metta Clines 01/03/2021, 4:28 PM

## 2021-01-03 NOTE — Progress Notes (Signed)
PROGRESS NOTE    Garrett Munoz  QVZ:[id number] DOB: 1945/04/25 DOA: 12/29/2020 PCP: [name], MD   Brief Narrative:  75 yo male with known COPD, pAFib, diastolic heart failure admitted with acute hypercarbic respiratory failure secondary to pneumonia.   Patient follows with Dr [name] for pulmonary outpatient for upper airway cough syndrome and dyspnea on exertion since Nov 2021. Patient presented to ED today for complaints of shortness of breath.  Vital signs on presentation: Temp 100.6, HR 109, RR 22, SpO2 88% on 6 L [place].  He was increased to 10 L [place] with improvement in oxygenation.  Imaging with new right lower lobe consolidation. Patient was started on antibiotics with Azithromycin and Rocephin.  Patient was later found to be unresponsive and ABG revealed hypercarbic respiratory failure with ABG: 7/119/91/34. He required intubation and mechanical ventilation. Work up for infection is in progress. Antibiotics have been broadened to Cefepime.    At time of eval patient remains somnolent saturating in low 90s on BIPAP, wife at bedside, states he has not felt well for past 10 months.  Assessment & Plan:   Principal Problem:   Acute respiratory failure with hypoxia and hypercapnia (HCC) Active Problems:   Morbid obesity ([place])   Persistent atrial fibrillation (HCC)   Acute metabolic encephalopathy   CHF NYHA class III, acute on chronic, diastolic (HCC)   Polymyalgia rheumatica (HCC)   Sepsis due to pneumonia ([place])   Respiratory failure ([place])  Acute hypoxic and hypercapnic respiratory failure secondary to aspiration pneumonia: Intubated 12/29/2020, extubated 12/31/2020.  Currently weaned to room air.  Continue antibiotics for total of 5 days.  Acute on chronic diastolic congestive heart failure: Echo completed on 12/30/2020 shows ejection fraction of 55 to 50%, no different than previous echo.  Not much diuresis yesterday despite of 40 mg IV Lasix.  Chest x-ray still shows pulmonary  edema but improved compared to the x-ray 5 days ago.  Will initiate on Lasix 40 mg IV twice daily, currently on 3 L of oxygen, try to wean oxygen.  Permanent atrial fibrillation: Rates fairly controlled.  Continue Coreg and Eliquis.  Hyperkalemia: Resolved  Essential hypertension: Controlled, continue losartan.  Hypophosphatemia: Resolved.  Acute thrombocytopenia: Likely secondary to acute illness.  No signs of bleeding.  Platelets improving.  Prediabetes: Continue SSI.  Generalized deconditioning/debility: Seen by PT OT.  Back at baseline.  Plan to discharge home once medically optimized.  DVT prophylaxis: SCDs Start: 12/29/20 1536   Code Status: Full Code  Family Communication: Wife present at bedside.  Plan of care discussed with patient in length and he verbalized understanding and agreed with it.  Status is: Inpatient  Remains inpatient appropriate because:Inpatient level of care appropriate due to severity of illness  Dispo: The patient is from: Home              Anticipated d/c is to: Home              Patient currently is not medically stable to d/c.   Difficult to place patient No        Estimated body mass index is 46.18 kg/m as calculated from the following:   Height as of this encounter: _0  (1.778 m).   Weight as of this encounter: 146 kg.     Nutritional Assessment: Body mass index is 46.18 kg/m.[name] Seen by dietician.  I agree with the assessment and plan as outlined below: Nutrition Status: Nutrition Problem: Inadequate oral intake Etiology: inability to eat Signs/Symptoms:  NPO status Interventions: Tube feeding, Prostat  .  Skin Assessment: I have examined the patient's skin and I agree with the wound assessment as performed by the wound care RN as outlined below:    Consultants:  None  Procedures:  Intubation  Antimicrobials:  Anti-infectives (From admission, onward)    Start     Dose/Rate Route Frequency Ordered Stop   01/02/21  1000  azithromycin (ZITHROMAX) tablet 500 mg        500 mg Oral Daily 01/01/21 1040 01/02/21 0915   01/01/21 0900  amoxicillin-clavulanate (AUGMENTIN) 875-125 MG per tablet 1 tablet        1 tablet Oral Every 12 hours 01/01/21 0819 01/04/21 0859   12/30/20 0900  cefTRIAXone (ROCEPHIN) 2 g in sodium chloride 0.9 % 100 mL IVPB  Status:  Discontinued        2 g 200 mL/hr over 30 Minutes Intravenous Every 24 hours 12/29/20 1656 01/01/21 0819   12/29/20 1600  ceFEPIme (MAXIPIME) 2 g in sodium chloride 0.9 % 100 mL IVPB  Status:  Discontinued        2 g 200 mL/hr over 30 Minutes Intravenous Every 8 hours 12/29/20 1550 12/29/20 1656   12/29/20 0900  cefTRIAXone (ROCEPHIN) 2 g in sodium chloride 0.9 % 100 mL IVPB  Status:  Discontinued        2 g 200 mL/hr over 30 Minutes Intravenous Every 24 hours 12/29/20 0852 12/29/20 1550   12/29/20 0900  azithromycin (ZITHROMAX) 500 mg in sodium chloride 0.9 % 250 mL IVPB  Status:  Discontinued        500 mg 250 mL/hr over 60 Minutes Intravenous Every 24 hours 12/29/20 0852 01/01/21 1040          Subjective: Seen and examined.  Sitting in the chair.  Wife and another family member at the bedside.  Patient states that he is feeling better.  Denied any shortness of breath but still on 3 L oxygen.  Objective: Vitals:   01/03/21 1000 01/03/21 1100 01/03/21 1158 01/03/21 1200  BP: 94/70   104/81  Pulse: 100 94  99  Resp:    17  Temp:   97.7 F (36.5 C)   TempSrc:   Oral   SpO2:    97%  Weight:      Height:        Intake/Output Summary (Last 24 hours) at 01/03/2021 1436 Last data filed at 01/03/2021 1338 Gross per 24 hour  Intake --  Output 1675 ml  Net -1675 ml    Filed Weights   12/30/20 0714 12/31/20 0346 01/02/21 0500  Weight: (!) 149.6 kg (!) 149.5 kg (!) 146 kg    Examination:  General exam: Appears calm and comfortable  Respiratory system: Bibasilar crackles. Respiratory effort normal. Cardiovascular system: S1 & S2 heard, RRR. No  JVD, murmurs, rubs, gallops or clicks, +2 pitting edema bilateral lower extremity. Gastrointestinal system: Abdomen is nondistended, soft and nontender. No organomegaly or masses felt. Normal bowel sounds heard. Central nervous system: Alert and oriented. No focal neurological deficits. Extremities: Symmetric 5 x 5 power. Skin: No rashes, lesions or ulcers.  Psychiatry: Judgement and insight appear normal. Mood & affect appropriate.     Data Reviewed: I have personally reviewed following labs and imaging studies  CBC: Recent Labs  Lab 12/29/20 0839 12/29/20 1714 12/30/20 0247 12/31/20 0419 01/01/21 0334 01/02/21 0331 01/03/21 0210  WBC 13.9*  --  20.4* 13.7* 9.5 7.5 10.1  NEUTROABS 11.7*  --   --   --   --   --   --  HGB 19.0*   < > 16.7 15.7 14.5 14.9 15.5  HCT 58.8*   < > 50.5 46.8 45.1 47.3 48.0  MCV 100.9*  --  96.6 95.3 99.1 100.6* 99.0  PLT 143*  --  155 123* 104* 122* 128*   < > = values in this interval not displayed.    Basic Metabolic Panel: Recent Labs  Lab 12/30/20 0247 12/30/20 1500 12/31/20 0419 12/31/20 1240 01/01/21 0334 01/02/21 0331 01/03/21 0210  NA 137 138 138 140 141 140 142  K 4.2 3.0* 2.5* 3.4* 3.6 5.4* 4.0  CL 96* 96* 98 100 102 98 94*  CO2 32 33* 32 32 33* 37* 37*  GLUCOSE 119* 122* 116* 103* 79 103* 118*  BUN _0 CREATININE 1.09 1.07 1.02 0.88 0.72 0.87 0.88  CALCIUM 8.5* 7.9* 8.1* 8.0* 8.2* 8.8* 9.4  MG 1.7  --  2.0  --  2.1 1.9 1.9  PHOS <1.0* 3.2  --   --   --   --   --     GFR: Estimated Creatinine Clearance: 103.2 mL/min (by C-G formula based on SCr of 0.88 mg/dL). Liver Function Tests: Recent Labs  Lab 12/29/20 0839  AST 20  ALT 29  ALKPHOS 60  BILITOT 0.9  PROT 7.2  ALBUMIN 3.8    No results for input(s): LIPASE, AMYLASE in the last 168 hours. No results for input(s): AMMONIA in the last 168 hours. Coagulation Profile: Recent Labs  Lab 12/29/20 1045  INR 1.0    Cardiac Enzymes: No results  for input(s): CKTOTAL, CKMB, CKMBINDEX, TROPONINI in the last 168 hours. BNP (last 3 results) No results for input(s): PROBNP in the last 8760 hours. HbA1C: No results for input(s): HGBA1C in the last 72 hours. CBG: Recent Labs  Lab 01/02/21 1936 01/02/21 2322 01/03/21 0333 01/03/21 0720 01/03/21 1125  GLUCAP 152* 109* 111* 105* 123*    Lipid Profile: No results for input(s): CHOL, HDL, LDLCALC, TRIG, CHOLHDL, LDLDIRECT in the last 72 hours. Thyroid Function Tests: No results for input(s): TSH, T4TOTAL, FREET4, T3FREE, THYROIDAB in the last 72 hours. Anemia Panel: No results for input(s): VITAMINB12, FOLATE, FERRITIN, TIBC, IRON, RETICCTPCT in the last 72 hours. Sepsis Labs: Recent Labs  Lab 12/29/20 0839 12/29/20 1041 12/29/20 1252  PROCALCITON  --   --  <0.10  LATICACIDVEN 1.7 1.3  --      Recent Results (from the past 240 hour(s))  Blood Culture (routine x 2)     Status: None   Collection Time: 12/29/20  8:39 AM   Specimen: BLOOD  Result Value Ref Range Status   Specimen Description BLOOD RIGHT ANTECUBITAL  Final   Special Requests   Final    BOTTLES DRAWN AEROBIC AND ANAEROBIC Blood Culture results may not be optimal due to an excessive volume of blood received in culture bottles   Culture   Final    NO GROWTH 5 DAYS Performed at [facility], [place] [address], [place], [place] [address]    Report Status 01/03/2021 FINAL  Final  Blood Culture (routine x 2)     Status: None   Collection Time: 12/29/20  8:51 AM   Specimen: BLOOD  Result Value Ref Range Status   Specimen Description BLOOD SITE NOT SPECIFIED  Final   Special Requests   Final    BOTTLES DRAWN AEROBIC AND ANAEROBIC Blood Culture adequate volume   Culture   Final    NO GROWTH  5 DAYS Performed at [facility], [place] [address] [place], [place] [address]    Report Status 01/03/2021 FINAL  Final  Resp Panel by RT-PCR (Flu A&B, Covid) Nasopharyngeal Swab     Status: None   Collection Time:  12/29/20  9:11 AM   Specimen: Nasopharyngeal Swab; Nasopharyngeal(NP) swabs in vial transport medium  Result Value Ref Range Status   SARS Coronavirus 2 by RT PCR NEGATIVE NEGATIVE Final    Comment: (NOTE) SARS-CoV-2 target nucleic acids are NOT DETECTED.  The SARS-CoV-2 RNA is generally detectable in upper respiratory specimens during the acute phase of infection. The lowest concentration of SARS-CoV-2 viral copies this assay can detect is 138 copies/mL. A negative result does not preclude SARS-Cov-2 infection and should not be used as the sole basis for treatment or other patient management decisions. A negative result may occur with  improper specimen collection/handling, submission of specimen other than nasopharyngeal swab, presence of viral mutation(s) within the areas targeted by this assay, and inadequate number of viral copies(<138 copies/mL). A negative result must be combined with clinical observations, patient history, and epidemiological information. The expected result is Negative.  Fact Sheet for Patients:  EntrepreneurPulse.com.au  Fact Sheet for Healthcare Providers:  IncredibleEmployment.be  This test is no t yet approved or cleared by the [place] FDA and  has been authorized for detection and/or diagnosis of SARS-CoV-2 by FDA under an Emergency Use Authorization (EUA). This EUA will remain  in effect (meaning this test can be used) for the duration of the COVID-19 declaration under Section 564(b)(1) of the Act, 21 U.S.C.section 360bbb-3(b)(1), unless the authorization is terminated  or revoked sooner.       Influenza A by PCR NEGATIVE NEGATIVE Final   Influenza B by PCR NEGATIVE NEGATIVE Final    Comment: (NOTE) The Xpert Xpress SARS-CoV-2/FLU/RSV plus assay is intended as an aid in the diagnosis of influenza from Nasopharyngeal swab specimens and should not be used as a sole basis for treatment. Nasal washings  and aspirates are unacceptable for Xpert Xpress SARS-CoV-2/FLU/RSV testing.  Fact Sheet for Patients: EntrepreneurPulse.com.au  Fact Sheet for Healthcare Providers: IncredibleEmployment.be  This test is not yet approved or cleared by the [place] FDA and has been authorized for detection and/or diagnosis of SARS-CoV-2 by FDA under an Emergency Use Authorization (EUA). This EUA will remain in effect (meaning this test can be used) for the duration of the COVID-19 declaration under Section 564(b)(1) of the Act, 21 U.S.C. section 360bbb-3(b)(1), unless the authorization is terminated or revoked.  Performed at [facility], [place] [address] [place], [place] [address]   Urine Culture     Status: None   Collection Time: 12/29/20 10:41 AM   Specimen: Urine, Clean Catch  Result Value Ref Range Status   Specimen Description URINE, CLEAN CATCH  Final   Special Requests Normal  Final   Culture   Final    NO GROWTH Performed at [facility], [place] [address], [place], [place] [address]    Report Status 12/30/2020 FINAL  Final  Culture, Respiratory w Gram Stain     Status: None   Collection Time: 12/29/20  4:37 PM   Specimen: Bronchoalveolar Lavage; Respiratory  Result Value Ref Range Status   Specimen Description BRONCHIAL ALVEOLAR LAVAGE  Final   Special Requests NONE  Final   Gram Stain   Final    FEW SQUAMOUS EPITHELIAL CELLS PRESENT MODERATE WBC PRESENT,BOTH PMN AND MONONUCLEAR FEW GRAM POSITIVE  COCCI    Culture   Final    FEW Normal respiratory flora-no Staph aureus or Pseudomonas seen Performed at [place] [address], [place], [place] [address]    Report Status 01/01/2021 FINAL  Final  MRSA Next Gen by PCR, Nasal     Status: None   Collection Time: 12/29/20  7:00 PM   Specimen: Nasal Mucosa; Nasal Swab  Result Value Ref Range Status   MRSA by PCR Next Gen NOT DETECTED NOT DETECTED Final    Comment:  (NOTE) The GeneXpert MRSA Assay (FDA approved for NASAL specimens only), is one component of a comprehensive MRSA colonization surveillance program. It is not intended to diagnose MRSA infection nor to guide or monitor treatment for MRSA infections. Test performance is not FDA approved in patients less than 17 years old. Performed at [facility], [place] [address], [place], [place] [address]        Radiology Studies: DG CHEST PORT 1 VIEW  Result Date: 01/03/2021 CLINICAL DATA:  Shortness of breath, evaluate for pleural effusion or pneumothorax EXAM: PORTABLE CHEST 1 VIEW COMPARISON:  Chest radiograph 12/29/2020 FINDINGS: The patient has been extubated. The heart is mildly enlarged. The mediastinal contours are within normal limits. There are increased interstitial markings throughout both lungs. Previously seen focal appearing opacities in the right lung have improved. There is no pleural effusion. There is no pneumothorax. There is no acute osseous abnormality. IMPRESSION: 1. Previously seen focal opacity in the right lung have improved. 2. Cardiomegaly with suspected mild pulmonary interstitial edema. 3. No pleural effusion pneumothorax. Electronically Signed   By: Valetta Mole M.D.   On: 01/03/2021 11:49    Scheduled Meds:  amoxicillin-clavulanate  1 tablet Oral Q12H   apixaban  5 mg Oral BID   calcium citrate  200 mg of elemental calcium Oral Daily   carvedilol  6.25 mg Oral BID WC   chlorhexidine gluconate (MEDLINE KIT)  15 mL Mouth Rinse BID   Chlorhexidine Gluconate Cloth  6 each Topical Daily   cholecalciferol  500 Units Oral Daily   docusate  100 mg Oral BID   furosemide  40 mg Intravenous BID   guaiFENesin  300 mg Oral Q6H   losartan  25 mg Oral Daily   polyethylene glycol  17 g Oral Daily   predniSONE  10 mg Oral Q breakfast   Continuous Infusions:   LOS: 5 days   Time spent: 30 minutes   Darliss Cheney, MD [facility]  01/03/2021, 2:36 PM  Please page  via [place] and do not message via secure chat for anything urgent. Secure chat can be used for anything non urgent.  How to contact the [facility] Attending or Consulting provider [place] or covering provider during after hours [place], for this patient?  Check the care team in [facility] and look for a) attending/consulting [facility] provider listed and b) the [facility] team listed. Page or secure chat [phone]. Log into [email or web address] and use [place]'s universal password to access. If you do not have the password, please contact the hospital operator. Locate the [facility] provider you are looking for under [facility] and page to a number that you can be directly reached. If you still have difficulty reaching the provider, please page the [facility] (Director on Call) for the Hospitalists listed on amion for assistance.

## 2021-01-04 DIAGNOSIS — J9602 Acute respiratory failure with hypercapnia: Secondary | ICD-10-CM | POA: Diagnosis not present

## 2021-01-04 DIAGNOSIS — J9601 Acute respiratory failure with hypoxia: Secondary | ICD-10-CM | POA: Diagnosis not present

## 2021-01-04 LAB — MAGNESIUM: Magnesium: 1.7 mg/dL (ref 1.7–2.4)

## 2021-01-04 MED ORDER — FUROSEMIDE 10 MG/ML IJ SOLN
60.0000 mg | Freq: Two times a day (BID) | INTRAMUSCULAR | Status: DC
  Administered 2021-01-04 – 2021-01-08 (×8): 60 mg via INTRAVENOUS
  Filled 2021-01-04 (×8): qty 6

## 2021-01-04 NOTE — Progress Notes (Signed)
Patient pulled off CPAP mask. RN placed VM mask on patient, however patient removed it as well. RT set up [place] HFNC at 6L. Patient is tolerating well at this time. RT will monitor as needed. CPAP and VM mask at bedside.

## 2021-01-04 NOTE — Progress Notes (Signed)
Patient having periods of OSA. Per patient's progress note, pt has CPAP at home but is noncompliant.  Pt agreed to wear CPAP after RT explained benefits to pt. RT set up CPAP with 6L O2 bled into circuit and placed on patient. Pt's dentures placed in container on bedside table. Pt is tolerating CPAP settings well at this time. Spo2 93%. RT will monitor as needed.

## 2021-01-04 NOTE — Progress Notes (Signed)
Pt refused CPAP. Pt is unable to tolerate CPAP and takes off mask while sleeping. RT placed pt on 8L [place] HFNC. Spo2 currently 92%. RT will monitor as needed.

## 2021-01-04 NOTE — Progress Notes (Signed)
PROGRESS NOTE    Garrett Munoz  HUT:[id number] DOB: 1946-04-02 DOA: 12/29/2020 PCP: [name], MD   Brief Narrative:  75 yo male with known COPD, pAFib, diastolic heart failure admitted with acute hypercarbic respiratory failure secondary to pneumonia.   Patient follows with Dr [name] for pulmonary outpatient for upper airway cough syndrome and dyspnea on exertion since Nov 2021. Patient presented to ED today for complaints of shortness of breath.  Vital signs on presentation: Temp 100.6, HR 109, RR 22, SpO2 88% on 6 L [place].  He was increased to 10 L [place] with improvement in oxygenation.  Imaging with new right lower lobe consolidation. Patient was started on antibiotics with Azithromycin and Rocephin.  Patient was later found to be unresponsive and ABG revealed hypercarbic respiratory failure with ABG: 7/119/91/34. He required intubation and mechanical ventilation. Work up for infection is in progress. Antibiotics have been broadened to Cefepime.    At time of eval patient remains somnolent saturating in low 90s on BIPAP, wife at bedside, states he has not felt well for past 10 months.  Assessment & Plan:   Principal Problem:   Acute respiratory failure with hypoxia and hypercapnia (HCC) Active Problems:   Morbid obesity ([place])   Persistent atrial fibrillation (HCC)   Acute metabolic encephalopathy   CHF NYHA class III, acute on chronic, diastolic (HCC)   Polymyalgia rheumatica (HCC)   Sepsis due to pneumonia ([place])   Respiratory failure ([place])  Acute hypoxic and hypercapnic respiratory failure secondary to aspiration pneumonia: Intubated 12/29/2020, extubated 12/31/2020.  Completed 5 days of antibiotics today.  Acute on chronic diastolic congestive heart failure: Echo completed on 12/30/2020 shows ejection fraction of 55 to 50%, no different than previous echo.  Not much diuresis charted however patient says that he has urinated several times and in fact he is tired of that.  Doubt the  output is accurate.  Chest x-ray still shows pulmonary edema but improved compared to the x-ray 5 days ago.  Still on 3 L oxygen.  Has faint crackles at the bases bilaterally and still with +2 pitting edema.  Will increase Lasix to 60 mg twice daily.  Try to wean oxygen.  Goal to send him home without oxygen if possible.  Permanent atrial fibrillation: Rates fairly controlled.  Continue Coreg and Eliquis.  Hyperkalemia: Resolved  Essential hypertension: Controlled, continue losartan.  Hypophosphatemia: Resolved.  Acute thrombocytopenia: Likely secondary to acute illness.  No signs of bleeding.  Platelets improving.  Prediabetes: Continue SSI.  Generalized deconditioning/debility: Seen by PT OT.  Back at baseline.  Plan to discharge home once medically optimized.  DVT prophylaxis: SCDs Start: 12/29/20 1536   Code Status: Full Code  Family Communication: Wife present at bedside.  Plan of care discussed with patient in length and he verbalized understanding and agreed with it.  Status is: Inpatient  Remains inpatient appropriate because:Inpatient level of care appropriate due to severity of illness  Dispo: The patient is from: Home              Anticipated d/c is to: Home              Patient currently is not medically stable to d/c.   Difficult to place patient No        Estimated body mass index is 46.18 kg/m as calculated from the following:   Height as of this encounter: _0  (1.778 m).   Weight as of this encounter: 146 kg.  Nutritional Assessment: Body mass index is 46.18 kg/m.[name] Seen by dietician.  I agree with the assessment and plan as outlined below: Nutrition Status: Nutrition Problem: Inadequate oral intake Etiology: inability to eat Signs/Symptoms: NPO status Interventions: Tube feeding, Prostat  .  Skin Assessment: I have examined the patient's skin and I agree with the wound assessment as performed by the wound care RN as outlined below:     Consultants:  None  Procedures:  Intubation  Antimicrobials:  Anti-infectives (From admission, onward)    Start     Dose/Rate Route Frequency Ordered Stop   01/02/21 1000  azithromycin (ZITHROMAX) tablet 500 mg        500 mg Oral Daily 01/01/21 1040 01/02/21 0915   01/01/21 0900  amoxicillin-clavulanate (AUGMENTIN) 875-125 MG per tablet 1 tablet        1 tablet Oral Every 12 hours 01/01/21 0819 01/03/21 2111   12/30/20 0900  cefTRIAXone (ROCEPHIN) 2 g in sodium chloride 0.9 % 100 mL IVPB  Status:  Discontinued        2 g 200 mL/hr over 30 Minutes Intravenous Every 24 hours 12/29/20 1656 01/01/21 0819   12/29/20 1600  ceFEPIme (MAXIPIME) 2 g in sodium chloride 0.9 % 100 mL IVPB  Status:  Discontinued        2 g 200 mL/hr over 30 Minutes Intravenous Every 8 hours 12/29/20 1550 12/29/20 1656   12/29/20 0900  cefTRIAXone (ROCEPHIN) 2 g in sodium chloride 0.9 % 100 mL IVPB  Status:  Discontinued        2 g 200 mL/hr over 30 Minutes Intravenous Every 24 hours 12/29/20 0852 12/29/20 1550   12/29/20 0900  azithromycin (ZITHROMAX) 500 mg in sodium chloride 0.9 % 250 mL IVPB  Status:  Discontinued        500 mg 250 mL/hr over 60 Minutes Intravenous Every 24 hours 12/29/20 0852 01/01/21 1040          Subjective: Seen and examined.  Denies any shortness of breath or other complaint.  Objective: Vitals:   01/04/21 0800 01/04/21 0900 01/04/21 1000 01/04/21 1100  BP: 136/83 116/76 121/90 112/76  Pulse: (!) 107 (!) 108 (!) 106 (!) 109  Resp: 20 (!) 25 (!) 25 20  Temp:      TempSrc:      SpO2: 95% (!) 89% 92% 94%  Weight:      Height:        Intake/Output Summary (Last 24 hours) at 01/04/2021 1131 Last data filed at 01/04/2021 1000 Gross per 24 hour  Intake 220 ml  Output 2825 ml  Net -2605 ml    Filed Weights   12/30/20 0714 12/31/20 0346 01/02/21 0500  Weight: (!) 149.6 kg (!) 149.5 kg (!) 146 kg    Examination:  General exam: Appears calm and comfortable, morbidly  obese Respiratory system: End crackles at bases bilaterally. Respiratory effort normal. Cardiovascular system: S1 & S2 heard, RRR. No JVD, murmurs, rubs, gallops or clicks.  +2 pitting edema bilateral lower extremity Gastrointestinal system: Abdomen is nondistended, soft and nontender. No organomegaly or masses felt. Normal bowel sounds heard. Central nervous system: Alert and oriented. No focal neurological deficits. Extremities: Symmetric 5 x 5 power. Skin: No rashes, lesions or ulcers.  Psychiatry: Judgement and insight appear normal. Mood & affect appropriate.   Data Reviewed: I have personally reviewed following labs and imaging studies  CBC: Recent Labs  Lab 12/29/20 0839 12/29/20 1714 12/30/20 0247 12/31/20 0419 01/01/21 0334 01/02/21 0331 01/03/21  0210  WBC 13.9*  --  20.4* 13.7* 9.5 7.5 10.1  NEUTROABS 11.7*  --   --   --   --   --   --   HGB 19.0*   < > 16.7 15.7 14.5 14.9 15.5  HCT 58.8*   < > 50.5 46.8 45.1 47.3 48.0  MCV 100.9*  --  96.6 95.3 99.1 100.6* 99.0  PLT 143*  --  155 123* 104* 122* 128*   < > = values in this interval not displayed.    Basic Metabolic Panel: Recent Labs  Lab 12/30/20 0247 12/30/20 1500 12/31/20 0419 12/31/20 1240 01/01/21 0334 01/02/21 0331 01/03/21 0210 01/04/21 0201  NA 137 138 138 140 141 140 142  --   K 4.2 3.0* 2.5* 3.4* 3.6 5.4* 4.0  --   CL 96* 96* 98 100 102 98 94*  --   CO2 32 33* 32 32 33* 37* 37*  --   GLUCOSE 119* 122* 116* 103* 79 103* 118*  --   BUN _0 --   CREATININE 1.09 1.07 1.02 0.88 0.72 0.87 0.88  --   CALCIUM 8.5* 7.9* 8.1* 8.0* 8.2* 8.8* 9.4  --   MG 1.7  --  2.0  --  2.1 1.9 1.9 1.7  PHOS <1.0* 3.2  --   --   --   --   --   --     GFR: Estimated Creatinine Clearance: 103.2 mL/min (by C-G formula based on SCr of 0.88 mg/dL). Liver Function Tests: Recent Labs  Lab 12/29/20 0839  AST 20  ALT 29  ALKPHOS 60  BILITOT 0.9  PROT 7.2  ALBUMIN 3.8    No results for input(s):  LIPASE, AMYLASE in the last 168 hours. No results for input(s): AMMONIA in the last 168 hours. Coagulation Profile: Recent Labs  Lab 12/29/20 1045  INR 1.0    Cardiac Enzymes: No results for input(s): CKTOTAL, CKMB, CKMBINDEX, TROPONINI in the last 168 hours. BNP (last 3 results) No results for input(s): PROBNP in the last 8760 hours. HbA1C: No results for input(s): HGBA1C in the last 72 hours. CBG: Recent Labs  Lab 01/02/21 1936 01/02/21 2322 01/03/21 0333 01/03/21 0720 01/03/21 1125  GLUCAP 152* 109* 111* 105* 123*    Lipid Profile: No results for input(s): CHOL, HDL, LDLCALC, TRIG, CHOLHDL, LDLDIRECT in the last 72 hours. Thyroid Function Tests: No results for input(s): TSH, T4TOTAL, FREET4, T3FREE, THYROIDAB in the last 72 hours. Anemia Panel: No results for input(s): VITAMINB12, FOLATE, FERRITIN, TIBC, IRON, RETICCTPCT in the last 72 hours. Sepsis Labs: Recent Labs  Lab 12/29/20 0839 12/29/20 1041 12/29/20 1252  PROCALCITON  --   --  <0.10  LATICACIDVEN 1.7 1.3  --      Recent Results (from the past 240 hour(s))  Blood Culture (routine x 2)     Status: None   Collection Time: 12/29/20  8:39 AM   Specimen: BLOOD  Result Value Ref Range Status   Specimen Description BLOOD RIGHT ANTECUBITAL  Final   Special Requests   Final    BOTTLES DRAWN AEROBIC AND ANAEROBIC Blood Culture results may not be optimal due to an excessive volume of blood received in culture bottles   Culture   Final    NO GROWTH 5 DAYS Performed at [facility], [place] [address], [place], [place] [address]    Report Status 01/03/2021 FINAL  Final  Blood Culture (routine x 2)  Status: None   Collection Time: 12/29/20  8:51 AM   Specimen: BLOOD  Result Value Ref Range Status   Specimen Description BLOOD SITE NOT SPECIFIED  Final   Special Requests   Final    BOTTLES DRAWN AEROBIC AND ANAEROBIC Blood Culture adequate volume   Culture   Final    NO GROWTH 5 DAYS Performed at [facility], [address] [place], [place] [address]    Report Status 01/03/2021 FINAL  Final  Resp Panel by RT-PCR (Flu A&B, Covid) Nasopharyngeal Swab     Status: None   Collection Time: 12/29/20  9:11 AM   Specimen: Nasopharyngeal Swab; Nasopharyngeal(NP) swabs in vial transport medium  Result Value Ref Range Status   SARS Coronavirus 2 by RT PCR NEGATIVE NEGATIVE Final    Comment: (NOTE) SARS-CoV-2 target nucleic acids are NOT DETECTED.  The SARS-CoV-2 RNA is generally detectable in upper respiratory specimens during the acute phase of infection. The lowest concentration of SARS-CoV-2 viral copies this assay can detect is 138 copies/mL. A negative result does not preclude SARS-Cov-2 infection and should not be used as the sole basis for treatment or other patient management decisions. A negative result may occur with  improper specimen collection/handling, submission of specimen other than nasopharyngeal swab, presence of viral mutation(s) within the areas targeted by this assay, and inadequate number of viral copies(<138 copies/mL). A negative result must be combined with clinical observations, patient history, and epidemiological information. The expected result is Negative.  Fact Sheet for Patients:  EntrepreneurPulse.com.au  Fact Sheet for Healthcare Providers:  IncredibleEmployment.be  This test is no t yet approved or cleared by the [place] FDA and  has been authorized for detection and/or diagnosis of SARS-CoV-2 by FDA under an Emergency Use Authorization (EUA). This EUA will remain  in effect (meaning this test can be used) for the duration of the COVID-19 declaration under Section 564(b)(1) of the Act, 21 U.S.C.section 360bbb-3(b)(1), unless the authorization is terminated  or revoked sooner.       Influenza A by PCR NEGATIVE NEGATIVE Final   Influenza B by PCR NEGATIVE NEGATIVE Final    Comment: (NOTE) The Xpert  Xpress SARS-CoV-2/FLU/RSV plus assay is intended as an aid in the diagnosis of influenza from Nasopharyngeal swab specimens and should not be used as a sole basis for treatment. Nasal washings and aspirates are unacceptable for Xpert Xpress SARS-CoV-2/FLU/RSV testing.  Fact Sheet for Patients: EntrepreneurPulse.com.au  Fact Sheet for Healthcare Providers: IncredibleEmployment.be  This test is not yet approved or cleared by the [place] FDA and has been authorized for detection and/or diagnosis of SARS-CoV-2 by FDA under an Emergency Use Authorization (EUA). This EUA will remain in effect (meaning this test can be used) for the duration of the COVID-19 declaration under Section 564(b)(1) of the Act, 21 U.S.C. section 360bbb-3(b)(1), unless the authorization is terminated or revoked.  Performed at [facility], [place] [address] [place], [place] [address]   Urine Culture     Status: None   Collection Time: 12/29/20 10:41 AM   Specimen: Urine, Clean Catch  Result Value Ref Range Status   Specimen Description URINE, CLEAN CATCH  Final   Special Requests Normal  Final   Culture   Final    NO GROWTH Performed at [facility], [place] [address] [place], [place] [address]    Report Status 12/30/2020 FINAL  Final  Culture, Respiratory w Gram Stain     Status: None  Collection Time: 12/29/20  4:37 PM   Specimen: Bronchoalveolar Lavage; Respiratory  Result Value Ref Range Status   Specimen Description BRONCHIAL ALVEOLAR LAVAGE  Final   Special Requests NONE  Final   Gram Stain   Final    FEW SQUAMOUS EPITHELIAL CELLS PRESENT MODERATE WBC PRESENT,BOTH PMN AND MONONUCLEAR FEW GRAM POSITIVE COCCI    Culture   Final    FEW Normal respiratory flora-no Staph aureus or Pseudomonas seen Performed at [facility], [place] [address] [place], [place] [address]    Report Status 01/01/2021 FINAL  Final  MRSA Next Gen by PCR, Nasal      Status: None   Collection Time: 12/29/20  7:00 PM   Specimen: Nasal Mucosa; Nasal Swab  Result Value Ref Range Status   MRSA by PCR Next Gen NOT DETECTED NOT DETECTED Final    Comment: (NOTE) The GeneXpert MRSA Assay (FDA approved for NASAL specimens only), is one component of a comprehensive MRSA colonization surveillance program. It is not intended to diagnose MRSA infection nor to guide or monitor treatment for MRSA infections. Test performance is not FDA approved in patients less than 43 years old. Performed at [facility], [place] [address] [place], [place] [address]        Radiology Studies: DG CHEST PORT 1 VIEW  Result Date: 01/03/2021 CLINICAL DATA:  Shortness of breath, evaluate for pleural effusion or pneumothorax EXAM: PORTABLE CHEST 1 VIEW COMPARISON:  Chest radiograph 12/29/2020 FINDINGS: The patient has been extubated. The heart is mildly enlarged. The mediastinal contours are within normal limits. There are increased interstitial markings throughout both lungs. Previously seen focal appearing opacities in the right lung have improved. There is no pleural effusion. There is no pneumothorax. There is no acute osseous abnormality. IMPRESSION: 1. Previously seen focal opacity in the right lung have improved. 2. Cardiomegaly with suspected mild pulmonary interstitial edema. 3. No pleural effusion pneumothorax. Electronically Signed   By: Valetta Mole M.D.   On: 01/03/2021 11:49    Scheduled Meds:  apixaban  5 mg Oral BID   calcium citrate  200 mg of elemental calcium Oral Daily   carvedilol  6.25 mg Oral BID WC   chlorhexidine gluconate (MEDLINE KIT)  15 mL Mouth Rinse BID   Chlorhexidine Gluconate Cloth  6 each Topical Daily   cholecalciferol  500 Units Oral Daily   docusate  100 mg Oral BID   furosemide  60 mg Intravenous BID   guaiFENesin  300 mg Oral Q6H   losartan  25 mg Oral Daily   polyethylene glycol  17 g Oral Daily   predniSONE  10 mg Oral Q breakfast    Continuous Infusions:   LOS: 6 days   Time spent: 28 minutes   Darliss Cheney, MD [facility]  01/04/2021, 11:31 AM  Please page via [facility] and do not message via secure chat for anything urgent. Secure chat can be used for anything non urgent.  How to contact the [facility] Attending or Consulting provider [place] or covering provider during after hours [place], for this patient?  Check the care team in [facility] and look for a) attending/consulting TR[facility] provider listed and b) the [facility] team listed. Page or secure chat [phone]. Log into [email or web address] and use [place]'s universal password to access. If you do not have the password, please contact the hospital operator. Locate the [facility] provider you are looking for under [facility] and page to a number that  you can be directly reached. If you still have difficulty reaching the provider, please page the [facility] (Director on Call) for the Hospitalists listed on amion for assistance.

## 2021-01-05 ENCOUNTER — Other Ambulatory Visit (HOSPITAL_COMMUNITY): Payer: Self-pay

## 2021-01-05 DIAGNOSIS — J9602 Acute respiratory failure with hypercapnia: Secondary | ICD-10-CM | POA: Diagnosis not present

## 2021-01-05 DIAGNOSIS — J9601 Acute respiratory failure with hypoxia: Secondary | ICD-10-CM | POA: Diagnosis not present

## 2021-01-05 LAB — CBC
HCT: 51 % (ref 39.0–52.0)
Hemoglobin: 16.1 g/dL (ref 13.0–17.0)
MCH: 31.3 pg (ref 26.0–34.0)
MCHC: 31.6 g/dL (ref 30.0–36.0)
MCV: 99.2 fL (ref 80.0–100.0)
Platelets: 165 10*3/uL (ref 150–400)
RBC: 5.14 MIL/uL (ref 4.22–5.81)
RDW: 13.3 % (ref 11.5–15.5)
WBC: 10.3 10*3/uL (ref 4.0–10.5)
nRBC: 0 % (ref 0.0–0.2)

## 2021-01-05 LAB — GLUCOSE, CAPILLARY
Glucose-Capillary: 113 mg/dL — ABNORMAL HIGH (ref 70–99)
Glucose-Capillary: 109 mg/dL — ABNORMAL HIGH (ref 70–99)
Glucose-Capillary: 114 mg/dL — ABNORMAL HIGH (ref 70–99)

## 2021-01-05 LAB — COMPREHENSIVE METABOLIC PANEL
ALT: 29 U/L (ref 0–44)
AST: 33 U/L (ref 15–41)
Albumin: 3.1 g/dL — ABNORMAL LOW (ref 3.5–5.0)
Alkaline Phosphatase: 51 U/L (ref 38–126)
Anion gap: 10 (ref 5–15)
BUN: 23 mg/dL (ref 8–23)
CO2: 43 mmol/L — ABNORMAL HIGH (ref 22–32)
Calcium: 9.1 mg/dL (ref 8.9–10.3)
Chloride: 87 mmol/L — ABNORMAL LOW (ref 98–111)
Creatinine, Ser: 0.86 mg/dL (ref 0.61–1.24)
GFR, Estimated: >60 mL/min (ref >60)
Glucose, Bld: 134 mg/dL — ABNORMAL HIGH (ref 70–99)
Potassium: 3.8 mmol/L (ref 3.5–5.1)
Sodium: 140 mmol/L (ref 135–145)
Total Bilirubin: 1.8 mg/dL — ABNORMAL HIGH (ref 0.3–1.2)
Total Protein: 6.3 g/dL — ABNORMAL LOW (ref 6.5–8.1)

## 2021-01-05 MED ORDER — APIXABAN 5 MG PO TABS
5.0000 mg | ORAL_TABLET | Freq: Two times a day (BID) | ORAL | 1 refills | Status: AC
  Filled 2021-01-05: qty 180, 90d supply, fill #0
  Filled 2021-01-06: qty 60, 30d supply, fill #0
  Filled 2021-06-29: qty 60, 30d supply, fill #1
  Filled 2021-06-30: qty 180, 90d supply, fill #1
  Filled 2021-09-28: qty 120, 60d supply, fill #2

## 2021-01-05 MED ORDER — MAGNESIUM SULFATE IN D5W 1-5 GM/100ML-% IV SOLN
1.0000 g | Freq: Once | INTRAVENOUS | Status: AC
  Administered 2021-01-05: 1 g via INTRAVENOUS
  Filled 2021-01-05: qty 100

## 2021-01-05 MED ORDER — LOPERAMIDE HCL 2 MG PO CAPS
4.0000 mg | ORAL_CAPSULE | ORAL | Status: DC | PRN
  Administered 2021-01-05 – 2021-01-07 (×2): 4 mg via ORAL
  Filled 2021-01-05 (×2): qty 2

## 2021-01-05 MED ORDER — TEMAZEPAM 30 MG PO CAPS
30.0000 mg | ORAL_CAPSULE | Freq: Every evening | ORAL | 2 refills | Status: DC | PRN
  Filled 2021-01-05: qty 30, 30d supply, fill #0
  Filled 2021-02-02: qty 30, 30d supply, fill #1
  Filled 2021-03-04: qty 30, 30d supply, fill #2

## 2021-01-05 NOTE — Telephone Encounter (Signed)
Prescription refill request for Eliquis received. Indication:AFIB Last office visit:KELLY 05/26/20 Scr:0.86 01/05/21 Age: 74M Weight:149.2KG

## 2021-01-05 NOTE — Progress Notes (Signed)
Speech Language Pathology Treatment: Dysphagia  Patient Details Name: ERCELL BESHARA MRN: [id number] DOB: [birth year] Today's Date: 01/05/2021 Time: 1235-1300 SLP Time Calculation (min) (ACUTE ONLY): 25 min  Assessment / Plan / Recommendation Clinical Impression  Pt was seen at bedside for skilled ST intervention targeting goals for diet tolerance and education on safe swallow precautions. Pt was eating lunch upon arrival of SLP. Wife present. Pt did not exhibit overt s/s aspiration on dys 3 lunch items, or honey thick liquids. Trials of nectar thick liquid were also tolerated without overt s/s aspiration. Will continue honey thick liquids at this time, as FEES report indicates no airway compromise on honey thick liquids. It would be beneficial to repeat instrumental study prior to DC to objectively determine least restrictive diet.     HPI HPI: Pt is a 75 y.o. male presented to ED for complaints of shortness of breath. Chest xray (9/12) significant for "patchy and confluent opacity in the right mid and lower lung is nonspecific but suggestive of multilobar pneumonia".   Pt later found unresponsive and ABG revealed hypercarbic respiratory failure. Intubated 9/12- 9/14. Aspiration PNA suspected in setting of chronic dysphagia per MD note 9/14. Pt has been seen by ENT previously with recent Esophagram (12/17/20) revealing mild dysmotility, small hiatal hernia. Also noted was "no gross swallow dysfunction aside from mild penetration".  Wife and daughter reported frequent coughing after meals over an extended period. PMH: COPD, HTN, pre-diabetes, OSA, pAFib, diastolic heart failure, followed by pulmonary OP for upper airway cough syndrome and dyspnea on exertion since Nov 2021.      SLP Plan  Continue with current plan of care      Recommendations for follow up therapy are one component of a multi-disciplinary discharge planning process, led by the attending physician.  Recommendations may be updated  based on patient status, additional functional criteria and insurance authorization.    Recommendations  Diet recommendations: Dysphagia 3 (mechanical soft);Honey-thick liquid Liquids provided via: Cup Medication Administration: Crushed with puree Supervision: Patient able to self feed;Full supervision/cueing for compensatory strategies Compensations: Clear throat intermittently;Multiple dry swallows after each bite/sip;Follow solids with liquid;Slow rate;Small sips/bites Postural Changes and/or Swallow Maneuvers: Seated upright 90 degrees                Oral Care Recommendations: Oral care BID Follow up Recommendations: Other (comment) (tbd) SLP Visit Diagnosis: Dysphagia, pharyngoesophageal phase (R13.14) Plan: Continue with current plan of care       GO               Ladarrion Telfair B. Quentin Ore, [facility], [place] Speech Language Pathologist Office: [phone]  Shonna Chock  01/05/2021, 1:21 PM

## 2021-01-05 NOTE — Progress Notes (Signed)
Occupational Therapy Treatment Patient Details Name: Garrett Munoz MRN: [id number] DOB: Nov 20, 1945 Today's Date: 01/05/2021   History of present illness 75 yo male admitted 9/12 with acute hypercarbic respiratory failure secondary to pneumonia.  Pt intubated 9/12- 9/14. PMhx:COPD, AFib, diastolic heart failure, OSA, gout, TKA   OT comments  Pt progressing towards OT goals though limited by urinary urgency and bowel incontinence bouts during session. Pt overall Supervision for mobility in room with and without RW. Pt did require assist for posterior hygiene x 2 to ensure thoroughness and contain soiled areas. However, pt able to demo ability to functionally reach to assist with peri care. Wife present and proficient in assisting pt. Educated pt/wife on use of BSC, line mgmt and safety strategies to implement for safe toileting without staff assist. DC recs remain appropriate.   SpO2 92% and above on 2 L O2 HR up to 130s.   Recommendations for follow up therapy are one component of a multi-disciplinary discharge planning process, led by the attending physician.  Recommendations may be updated based on patient status, additional functional criteria and insurance authorization.    Follow Up Recommendations  No OT follow up    Equipment Recommendations  None recommended by OT    Recommendations for Other Services      Precautions / Restrictions Precautions Precautions: Fall Precaution Comments: watch O2 and HR; bowel/bladder urgency Restrictions Weight Bearing Restrictions: No       Mobility Bed Mobility Overal bed mobility: Modified Independent Bed Mobility: Supine to Sit     Supine to sit: Modified independent (Device/Increase time);HOB elevated          Transfers Overall transfer level: Needs assistance Equipment used: Rolling walker (2 wheeled) Transfers: Sit to/from Stand Sit to Stand: Supervision              Balance Overall balance assessment: Needs  assistance Sitting-balance support: No upper extremity supported;Feet supported Sitting balance-Leahy Scale: Normal     Standing balance support: Bilateral upper extremity supported Standing balance-Leahy Scale: Fair Standing balance comment: able to static stand without RW                           ADL either performed or assessed with clinical judgement   ADL Overall ADL's : Needs assistance/impaired     Grooming: Set up;Standing;Wash/dry hands Grooming Details (indicate cue type and reason): standing at sink                 Toilet Transfer: Min guard;BSC;RW   Toileting- Clothing Manipulation and Hygiene: Moderate assistance;Sit to/from stand Toileting - Clothing Manipulation Details (indicate cue type and reason): Found on bedpan - had called for assistance but need for BM became urgent. Wife assisted pt on bedpan though not fully under pt. Required assist to clean at bed level to minimize soiled area. While at sink, urinary urgency occurred and wife provided urinal with pt able to use statically standing without UE support with minor assist for gown mgmt/ Pt also with bowel incontinence standing at sink, placed BSC behind pt and assisted with peri care. Pt was able to functionally demo ability to reach back to clean bottom without physical assist after initial assist to clean and prevent contamination of areas     Functional mobility during ADLs: Supervision/safety;Rolling walker;Cueing for sequencing General ADL Comments: Pt limited by bouts of diarrhea and urinary urgency primarily during session, requiring assist to maintain optimal hygiene. Pt able to demo short  mobility without AD at end of session as well     Vision   Vision Assessment?: No apparent visual deficits   Perception     Praxis      Cognition Arousal/Alertness: Awake/alert Behavior During Therapy: WFL for tasks assessed/performed Overall Cognitive Status: Within Functional Limits for tasks  assessed                                          Exercises     Shoulder Instructions       General Comments SpO2 92% and above on 2 L O2, 1/4 DOE. HR up to 130s with activity    Pertinent Vitals/ Pain       Pain Assessment: No/denies pain  Home Living                                          Prior Functioning/Environment              Frequency  Min 2X/week        Progress Toward Goals  OT Goals(current goals can now be found in the care plan section)  Progress towards OT goals: Progressing toward goals  Acute Rehab OT Goals Patient Stated Goal: To go home OT Goal Formulation: With patient Time For Goal Achievement: 01/17/21 Potential to Achieve Goals: Good ADL Goals Pt Will Perform Grooming: sitting;with set-up Pt Will Perform Lower Body Bathing: with supervision;sit to/from stand Pt Will Perform Lower Body Dressing: with set-up;sit to/from stand Pt Will Transfer to Toilet: with modified independence;ambulating;regular height toilet Pt Will Perform Toileting - Clothing Manipulation and hygiene: Independently;sit to/from stand Pt/caregiver will Perform Home Exercise Program: Increased strength;Both right and left upper extremity;With theraband;Independently;With written HEP provided  Plan Discharge plan remains appropriate    Co-evaluation                 AM-PAC OT "6 Clicks" Daily Activity     Outcome Measure   Help from another person eating meals?: None Help from another person taking care of personal grooming?: None Help from another person toileting, which includes using toliet, bedpan, or urinal?: A Lot Help from another person bathing (including washing, rinsing, drying)?: A Little Help from another person to put on and taking off regular upper body clothing?: None Help from another person to put on and taking off regular lower body clothing?: A Little 6 Click Score: 20    End of Session Equipment  Utilized During Treatment: Oxygen;Rolling walker  OT Visit Diagnosis: Unsteadiness on feet (R26.81)   Activity Tolerance Patient tolerated treatment well   Patient Left in chair;with call bell/phone within reach;with family/visitor present   Nurse Communication Mobility status;Other (comment) (bowel issues, O2, ok for wife to assist pt to [facility])        Time: [id number] OT Time Calculation (min): 35 min  Charges: OT General Charges $OT Visit: 1 Visit OT Treatments $Self Care/Home Management : 23-37 mins  Malachy Chamber, OTR/L Acute Rehab Services Office: [phone]   Layla Maw 01/05/2021, 10:02 AM

## 2021-01-05 NOTE — Care Management Important Message (Signed)
Important Message  Patient Details  Name: Garrett Munoz MRN: [id number] Date of Birth: Aug 12, 1945   Medicare Important Message Given:  Yes     Orbie Pyo 01/05/2021, 4:43 PM

## 2021-01-05 NOTE — Progress Notes (Signed)
  Mobility Specialist Criteria Algorithm Info.  Mobility Team: [facility] elevated:HOB 30 Activity: Ambulated in room; Ambulated to bathroom (Declined hallway ambulation but requested assistance to bathroom) Range of motion: Active; All extremities Level of assistance: Standby assist, set-up cues, supervision of patient - no hands on Assistive device: None Minutes sitting in chair:  Minutes stood:  Minutes ambulated: 2 minutes Distance ambulated (ft): 25 ft Mobility response: Tolerated well Bed Position: Chair (In chair before and after ambulation)  Patient received in recliner chair, declined hallway ambulation but requested assistance to bathroom. Ambulated in room with steady gait at supervision. Returned to recliner chair with all needs met.  01/05/2021 3:57 PM

## 2021-01-05 NOTE — Progress Notes (Signed)
Physical Therapy Treatment Patient Details Name: Garrett Munoz MRN: [id number] DOB: [birth year] Today's Date: 01/05/2021   History of Present Illness Pt is a 75 y.o. male admitted 12/29/20 with acute hypercarbic respiratory failure secondary to PNA. ETT 9/12-9/14. PMH includes COPD (not on home O2), afib, HF, OSA, gout, TKA.   PT Comments    Pt progressing with mobility. Today's session focused on ambulation for improving activity tolerance and strength; pt moving well with RW and intermittent min guard for balance. Pt not interested in gait training without DME this session. Pt's wife present and supportive; educ re: activity recommendations, fall risk reduction, DME needs. Will continue to follow acutely to address established goals.  SpO2 87-92% on 2L O2 with ambulation    Recommendations for follow up therapy are one component of a multi-disciplinary discharge planning process, led by the attending physician.  Recommendations may be updated based on patient status, additional functional criteria and insurance authorization.  Follow Up Recommendations  Home health PT;Supervision for mobility/OOB     Equipment Recommendations  None recommended by PT    Recommendations for Other Services       Precautions / Restrictions Precautions Precautions: Fall;Other (comment) Precaution Comments: Watch SpO2; bladder/bowel incontinence Restrictions Weight Bearing Restrictions: No     Mobility  Bed Mobility Overal bed mobility: Modified Independent Bed Mobility: Supine to Sit     Supine to sit: Modified independent (Device/Increase time);HOB elevated     General bed mobility comments: Received sitting in recliner    Transfers Overall transfer level: Modified independent Equipment used: Rolling walker (2 wheeled) Transfers: Sit to/from Stand Sit to Stand: Supervision            Ambulation/Gait Ambulation/Gait assistance: Supervision Gait Distance (Feet): 216  Feet Assistive device: Rolling walker (2 wheeled) Gait Pattern/deviations: Step-through pattern;Trunk flexed;Decreased stride length Gait velocity: Decreased   General Gait Details: Pt requesting use of RW, not interested in gait training without RW this session; slow, steady gait with RW and supervision for safety/lines; 1x standing rest break secondary to fatigue and SOB, DOE 2-3/4   Chief Strategy Officer    Modified Rankin (Stroke Patients Only)       Balance Overall balance assessment: Needs assistance Sitting-balance support: No upper extremity supported;Feet supported Sitting balance-Leahy Scale: Good     Standing balance support: Bilateral upper extremity supported;No upper extremity supported;During functional activity Standing balance-Leahy Scale: Fair Standing balance comment: able to static stand without RW                            Cognition Arousal/Alertness: Awake/alert Behavior During Therapy: WFL for tasks assessed/performed Overall Cognitive Status: Within Functional Limits for tasks assessed                                        Exercises      General Comments General comments (skin integrity, edema, etc.): SpO2 down to 87% on 2L O2 with activity; DOE up to 2-3/4. Wife Katharine Look) present and supportive; discussed DME needs, fall risk reduction, activity recommendations for return home; pt and wife in agreement      Pertinent Vitals/Pain Pain Assessment: No/denies pain    Home Living  Prior Function            PT Goals (current goals can now be found in the care plan section) Acute Rehab PT Goals Patient Stated Goal: To go home Progress towards PT goals: Progressing toward goals    Frequency    Min 3X/week      PT Plan Current plan remains appropriate    Co-evaluation              AM-PAC PT "6 Clicks" Mobility   Outcome Measure  Help needed  turning from your back to your side while in a flat bed without using bedrails?: None Help needed moving from lying on your back to sitting on the side of a flat bed without using bedrails?: None Help needed moving to and from a bed to a chair (including a wheelchair)?: None Help needed standing up from a chair using your arms (e.g., wheelchair or bedside chair)?: None Help needed to walk in hospital room?: A Little Help needed climbing 3-5 steps with a railing? : A Little 6 Click Score: 22    End of Session Equipment Utilized During Treatment: Gait belt;Oxygen Activity Tolerance: Patient tolerated treatment well Patient left: in chair;with call bell/phone within reach;with family/visitor present Nurse Communication: Mobility status PT Visit Diagnosis: Other abnormalities of gait and mobility (R26.89);Muscle weakness (generalized) (M62.81);Unsteadiness on feet (R26.81)     Time: 1133-1150 PT Time Calculation (min) (ACUTE ONLY): 17 min  Charges:  $Therapeutic Exercise: 8-22 mins                     Mabeline Caras, PT, DPT Acute Rehabilitation Services  Pager [phone] Office [phone]  Derry Lory 01/05/2021, 12:59 PM

## 2021-01-05 NOTE — Progress Notes (Signed)
PROGRESS NOTE    Garrett Munoz  QBH:[id number] DOB: 1945/05/14 DOA: 12/29/2020 PCP: [name], MD   Brief Narrative:  75 yo male with known COPD, pAFib, diastolic heart failure admitted with acute hypercarbic respiratory failure secondary to pneumonia.   Patient follows with Dr [name] for pulmonary outpatient for upper airway cough syndrome and dyspnea on exertion since Nov 2021. Patient presented to ED today for complaints of shortness of breath.  Vital signs on presentation: Temp 100.6, HR 109, RR 22, SpO2 88% on 6 L [place].  He was increased to 10 L [place] with improvement in oxygenation.  Imaging with new right lower lobe consolidation. Patient was started on antibiotics with Azithromycin and Rocephin.  Patient was later found to be unresponsive and ABG revealed hypercarbic respiratory failure with ABG: 7/119/91/34. He required intubation and mechanical ventilation. Work up for infection is in progress. Antibiotics have been broadened to Cefepime.    At time of eval patient remains somnolent saturating in low 90s on BIPAP, wife at bedside, states he has not felt well for past 10 months.  Assessment & Plan:   Principal Problem:   Acute respiratory failure with hypoxia and hypercapnia (HCC) Active Problems:   Morbid obesity ([place])   Persistent atrial fibrillation (HCC)   Acute metabolic encephalopathy   CHF NYHA class III, acute on chronic, diastolic (HCC)   Polymyalgia rheumatica (HCC)   Sepsis due to pneumonia ([place])   Respiratory failure ([place])  Acute hypoxic and hypercapnic respiratory failure secondary to aspiration pneumonia: Intubated 12/29/2020, extubated 12/31/2020.  Completed 5 days of antibiotics today.  Acute on chronic diastolic congestive heart failure: Echo completed on 12/30/2020 shows ejection fraction of 55 to 50%, no different than previous echo.   Chest x-ray 01/03/2021 still shows pulmonary edema but improved compared to the x-ray 5 days ago.  Now down to 2 L of oxygen  but he still has bibasilar crackles and +2-3 pitting edema so I will continue current Lasix.    Permanent atrial fibrillation: Rates fairly controlled.  Continue Coreg and Eliquis.  Hyperkalemia: Resolved  Essential hypertension: Controlled, continue losartan.  Hypophosphatemia: Resolved.  Acute thrombocytopenia: Likely secondary to acute illness.  No signs of bleeding.  Platelets improving.  Prediabetes: Continue SSI.  Diarrhea/IBS: Patient has been having frequent diarrhea/loose stools since last 4 days.  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  He is on Imodium 2 mg, will increase that to 4 mg as needed and I will discontinue Colace.  Generalized deconditioning/debility: Seen by PT OT.  Back at baseline.  Plan to discharge home once medically optimized.  DVT prophylaxis: SCDs Start: 12/29/20 1536   Code Status: Full Code  Family Communication: Wife present at bedside.  Plan of care discussed with patient in length and he verbalized understanding and agreed with it.  Status is: Inpatient  Remains inpatient appropriate because:Inpatient level of care appropriate due to severity of illness  Dispo: The patient is from: Home              Anticipated d/c is to: Home              Patient currently is not medically stable to d/c.   Difficult to place patient No        Estimated body mass index is 43.05 kg/m as calculated from the following:   Height as of this encounter: 5' 11" (1.803 m).   Weight as of this encounter: 140  kg.     Nutritional Assessment: Body mass index is 43.05 kg/m.[name] Seen by dietician.  I agree with the assessment and plan as outlined below: Nutrition Status: Nutrition Problem: Inadequate oral intake Etiology: inability to eat Signs/Symptoms: NPO status Interventions: Tube feeding, Prostat  .  Skin Assessment: I have examined the patient's skin and I agree with the wound assessment as performed by  the wound care RN as outlined below:    Consultants:  None  Procedures:  Intubation  Antimicrobials:  Anti-infectives (From admission, onward)    Start     Dose/Rate Route Frequency Ordered Stop   01/02/21 1000  azithromycin (ZITHROMAX) tablet 500 mg        500 mg Oral Daily 01/01/21 1040 01/02/21 0915   01/01/21 0900  amoxicillin-clavulanate (AUGMENTIN) 875-125 MG per tablet 1 tablet        1 tablet Oral Every 12 hours 01/01/21 0819 01/03/21 2111   12/30/20 0900  cefTRIAXone (ROCEPHIN) 2 g in sodium chloride 0.9 % 100 mL IVPB  Status:  Discontinued        2 g 200 mL/hr over 30 Minutes Intravenous Every 24 hours 12/29/20 1656 01/01/21 0819   12/29/20 1600  ceFEPIme (MAXIPIME) 2 g in sodium chloride 0.9 % 100 mL IVPB  Status:  Discontinued        2 g 200 mL/hr over 30 Minutes Intravenous Every 8 hours 12/29/20 1550 12/29/20 1656   12/29/20 0900  cefTRIAXone (ROCEPHIN) 2 g in sodium chloride 0.9 % 100 mL IVPB  Status:  Discontinued        2 g 200 mL/hr over 30 Minutes Intravenous Every 24 hours 12/29/20 0852 12/29/20 1550   12/29/20 0900  azithromycin (ZITHROMAX) 500 mg in sodium chloride 0.9 % 250 mL IVPB  Status:  Discontinued        500 mg 250 mL/hr over 60 Minutes Intravenous Every 24 hours 12/29/20 0852 01/01/21 1040          Subjective: Patient seen and examined.  He has no complaints other than diarrhea.  No shortness of breath.  Objective: Vitals:   01/04/21 2330 01/05/21 0500 01/05/21 0753 01/05/21 0831  BP: 116/79 (!) 119/94  (!) 129/94  Pulse: (!) 110 (!) 108 99 (!) 108  Resp: _0 Temp: 97.7 F (36.5 C) 97.8 F (36.6 C)    TempSrc: Oral Oral    SpO2: 99% 100% 95%   Weight:      Height:        Intake/Output Summary (Last 24 hours) at 01/05/2021 1020 Last data filed at 01/05/2021 0756 Gross per 24 hour  Intake 238 ml  Output 1201 ml  Net -963 ml    Filed Weights   12/31/20 0346 01/02/21 0500 01/04/21 2328  Weight: (!) 149.5 kg (!) 146 kg  (!) 140 kg    Examination:  General exam: Appears calm and comfortable  Respiratory system: Bilateral basal crackles.  Respiratory effort normal. Cardiovascular system: S1 & S2 heard, RRR. No JVD, murmurs, rubs, gallops or clicks.  +2 pitting edema bilateral lower extremity Gastrointestinal system: Abdomen is nondistended, soft and nontender. No organomegaly or masses felt. Normal bowel sounds heard. Central nervous system: Alert and oriented. No focal neurological deficits. Extremities: Symmetric 5 x 5 power. Skin: No rashes, lesions or ulcers.  Psychiatry: Judgement and insight appear normal. Mood & affect appropriate.    Data Reviewed: I have personally reviewed following labs and imaging studies  CBC: Recent Labs  Lab  12/31/20 0419 01/01/21 0334 01/02/21 0331 01/03/21 0210 01/05/21 0815  WBC 13.7* 9.5 7.5 10.1 10.3  HGB 15.7 14.5 14.9 15.5 16.1  HCT 46.8 45.1 47.3 48.0 51.0  MCV 95.3 99.1 100.6* 99.0 99.2  PLT 123* 104* 122* 128* 240    Basic Metabolic Panel: Recent Labs  Lab 12/30/20 0247 12/30/20 1500 12/31/20 0419 12/31/20 1240 01/01/21 0334 01/02/21 0331 01/03/21 0210 01/04/21 0201 01/05/21 0815  NA 137 138 138 140 141 140 142  --  140  K 4.2 3.0* 2.5* 3.4* 3.6 5.4* 4.0  --  3.8  CL 96* 96* 98 100 102 98 94*  --  87*  CO2 32 33* 32 32 33* 37* 37*  --  43*  GLUCOSE 119* 122* 116* 103* 79 103* 118*  --  134*  BUN _0 --  23  CREATININE 1.09 1.07 1.02 0.88 0.72 0.87 0.88  --  0.86  CALCIUM 8.5* 7.9* 8.1* 8.0* 8.2* 8.8* 9.4  --  9.1  MG 1.7  --  2.0  --  2.1 1.9 1.9 1.7  --   PHOS <1.0* 3.2  --   --   --   --   --   --   --     GFR: Estimated Creatinine Clearance: 104.6 mL/min (by C-G formula based on SCr of 0.86 mg/dL). Liver Function Tests: Recent Labs  Lab 01/05/21 0815  AST 33  ALT 29  ALKPHOS 51  BILITOT 1.8*  PROT 6.3*  ALBUMIN 3.1*    No results for input(s): LIPASE, AMYLASE in the last 168 hours. No results for  input(s): AMMONIA in the last 168 hours. Coagulation Profile: Recent Labs  Lab 12/29/20 1045  INR 1.0    Cardiac Enzymes: No results for input(s): CKTOTAL, CKMB, CKMBINDEX, TROPONINI in the last 168 hours. BNP (last 3 results) No results for input(s): PROBNP in the last 8760 hours. HbA1C: No results for input(s): HGBA1C in the last 72 hours. CBG: Recent Labs  Lab 01/02/21 1936 01/02/21 2322 01/03/21 0333 01/03/21 0720 01/03/21 1125  GLUCAP 152* 109* 111* 105* 123*    Lipid Profile: No results for input(s): CHOL, HDL, LDLCALC, TRIG, CHOLHDL, LDLDIRECT in the last 72 hours. Thyroid Function Tests: No results for input(s): TSH, T4TOTAL, FREET4, T3FREE, THYROIDAB in the last 72 hours. Anemia Panel: No results for input(s): VITAMINB12, FOLATE, FERRITIN, TIBC, IRON, RETICCTPCT in the last 72 hours. Sepsis Labs: Recent Labs  Lab 12/29/20 1041 12/29/20 1252  PROCALCITON  --  <0.10  LATICACIDVEN 1.3  --      Recent Results (from the past 240 hour(s))  Blood Culture (routine x 2)     Status: None   Collection Time: 12/29/20  8:39 AM   Specimen: BLOOD  Result Value Ref Range Status   Specimen Description BLOOD RIGHT ANTECUBITAL  Final   Special Requests   Final    BOTTLES DRAWN AEROBIC AND ANAEROBIC Blood Culture results may not be optimal due to an excessive volume of blood received in culture bottles   Culture   Final    NO GROWTH 5 DAYS Performed at [facility], [place] [address] [place], [place] [address]    Report Status 01/03/2021 FINAL  Final  Blood Culture (routine x 2)     Status: None   Collection Time: 12/29/20  8:51 AM   Specimen: BLOOD  Result Value Ref Range Status   Specimen Description BLOOD SITE NOT SPECIFIED  Final  Special Requests   Final    BOTTLES DRAWN AEROBIC AND ANAEROBIC Blood Culture adequate volume   Culture   Final    NO GROWTH 5 DAYS Performed at [facility], [address] [place], [place] [address]    Report Status  01/03/2021 FINAL  Final  Resp Panel by RT-PCR (Flu A&B, Covid) Nasopharyngeal Swab     Status: None   Collection Time: 12/29/20  9:11 AM   Specimen: Nasopharyngeal Swab; Nasopharyngeal(NP) swabs in vial transport medium  Result Value Ref Range Status   SARS Coronavirus 2 by RT PCR NEGATIVE NEGATIVE Final    Comment: (NOTE) SARS-CoV-2 target nucleic acids are NOT DETECTED.  The SARS-CoV-2 RNA is generally detectable in upper respiratory specimens during the acute phase of infection. The lowest concentration of SARS-CoV-2 viral copies this assay can detect is 138 copies/mL. A negative result does not preclude SARS-Cov-2 infection and should not be used as the sole basis for treatment or other patient management decisions. A negative result may occur with  improper specimen collection/handling, submission of specimen other than nasopharyngeal swab, presence of viral mutation(s) within the areas targeted by this assay, and inadequate number of viral copies(<138 copies/mL). A negative result must be combined with clinical observations, patient history, and epidemiological information. The expected result is Negative.  Fact Sheet for Patients:  https://www.fda.gov/media/152166/download  Fact Sheet for Healthcare Providers:  https://www.fda.gov/media/152162/download  This test is no t yet approved or cleared by the [place] FDA and  has been authorized for detection and/or diagnosis of SARS-CoV-2 by FDA under an Emergency Use Authorization (EUA). This EUA will remain  in effect (meaning this test can be used) for the duration of the COVID-19 declaration under Section 564(b)(1) of the Act, 21 U.S.C.section 360bbb-3(b)(1), unless the authorization is terminated  or revoked sooner.       Influenza A by PCR NEGATIVE NEGATIVE Final   Influenza B by PCR NEGATIVE NEGATIVE Final    Comment: (NOTE) The Xpert Xpress SARS-CoV-2/FLU/RSV plus assay is intended as an aid in the diagnosis of  influenza from Nasopharyngeal swab specimens and should not be used as a sole basis for treatment. Nasal washings and aspirates are unacceptable for Xpert Xpress SARS-CoV-2/FLU/RSV testing.  Fact Sheet for Patients: https://www.fda.gov/media/152166/download  Fact Sheet for Healthcare Providers: https://www.fda.gov/media/152162/download  This test is not yet approved or cleared by the [place] FDA and has been authorized for detection and/or diagnosis of SARS-CoV-2 by FDA under an Emergency Use Authorization (EUA). This EUA will remain in effect (meaning this test can be used) for the duration of the COVID-19 declaration under Section 564(b)(1) of the Act, 21 U.S.C. section 360bbb-3(b)(1), unless the authorization is terminated or revoked.  Performed at [facility], [address] [place], [place] [address]   Urine Culture     Status: None   Collection Time: 12/29/20 10:41 AM   Specimen: Urine, Clean Catch  Result Value Ref Range Status   Specimen Description URINE, CLEAN CATCH  Final   Special Requests Normal  Final   Culture   Final    NO GROWTH Performed at [facility], [address] [place], [place] [address]    Report Status 12/30/2020 FINAL  Final  Culture, Respiratory w Gram Stain     Status: None   Collection Time: 12/29/20  4:37 PM   Specimen: Bronchoalveolar Lavage; Respiratory  Result Value Ref Range Status   Specimen Description BRONCHIAL ALVEOLAR LAVAGE  Final   Special   Requests NONE  Final   Gram Stain   Final    FEW SQUAMOUS EPITHELIAL CELLS PRESENT MODERATE WBC PRESENT,BOTH PMN AND MONONUCLEAR FEW GRAM POSITIVE COCCI    Culture   Final    FEW Normal respiratory flora-no Staph aureus or Pseudomonas seen Performed at [facility], [address], [place], [place] [address]    Report Status 01/01/2021 FINAL  Final  MRSA Next Gen by PCR, Nasal     Status: None   Collection Time: 12/29/20  7:00 PM   Specimen: Nasal Mucosa; Nasal  Swab  Result Value Ref Range Status   MRSA by PCR Next Gen NOT DETECTED NOT DETECTED Final    Comment: (NOTE) The GeneXpert MRSA Assay (FDA approved for NASAL specimens only), is one component of a comprehensive MRSA colonization surveillance program. It is not intended to diagnose MRSA infection nor to guide or monitor treatment for MRSA infections. Test performance is not FDA approved in patients less than 12 years old. Performed at [facility], [place] [address], [place], [place] [address]        Radiology Studies: DG CHEST PORT 1 VIEW  Result Date: 01/03/2021 CLINICAL DATA:  Shortness of breath, evaluate for pleural effusion or pneumothorax EXAM: PORTABLE CHEST 1 VIEW COMPARISON:  Chest radiograph 12/29/2020 FINDINGS: The patient has been extubated. The heart is mildly enlarged. The mediastinal contours are within normal limits. There are increased interstitial markings throughout both lungs. Previously seen focal appearing opacities in the right lung have improved. There is no pleural effusion. There is no pneumothorax. There is no acute osseous abnormality. IMPRESSION: 1. Previously seen focal opacity in the right lung have improved. 2. Cardiomegaly with suspected mild pulmonary interstitial edema. 3. No pleural effusion pneumothorax. Electronically Signed   By: Valetta Mole M.D.   On: 01/03/2021 11:49    Scheduled Meds:  apixaban  5 mg Oral BID   calcium citrate  200 mg of elemental calcium Oral Daily   carvedilol  6.25 mg Oral BID WC   chlorhexidine gluconate (MEDLINE KIT)  15 mL Mouth Rinse BID   Chlorhexidine Gluconate Cloth  6 each Topical Daily   cholecalciferol  500 Units Oral Daily   furosemide  60 mg Intravenous BID   guaiFENesin  300 mg Oral Q6H   losartan  25 mg Oral Daily   polyethylene glycol  17 g Oral Daily   predniSONE  10 mg Oral Q breakfast   Continuous Infusions:   LOS: 7 days   Time spent: 27 minutes   Darliss Cheney, MD [facility]  01/05/2021, 10:20 AM  Please page via [place] and do not message via secure chat for anything urgent. Secure chat can be used for anything non urgent.  How to contact the [facility] Attending or Consulting provider [place] or covering provider during after hours [place], for this patient?  Check the care team in [facility] and look for a) attending/consulting [facility] provider listed and b) the [facility] team listed. Page or secure chat [phone]. Log into [email or web address] and use [place]'s universal password to access. If you do not have the password, please contact the hospital operator. Locate the [facility] provider you are looking for under [facility] and page to a number that you can be directly reached. If you still have difficulty reaching the provider, please page the [facility] (Director on Call) for the Hospitalists listed on amion for assistance.

## 2021-01-06 ENCOUNTER — Other Ambulatory Visit: Payer: Self-pay

## 2021-01-06 ENCOUNTER — Other Ambulatory Visit (HOSPITAL_COMMUNITY): Payer: Self-pay

## 2021-01-06 ENCOUNTER — Encounter (HOSPITAL_COMMUNITY): Payer: Self-pay | Admitting: Internal Medicine

## 2021-01-06 ENCOUNTER — Inpatient Hospital Stay (HOSPITAL_COMMUNITY): Payer: PPO

## 2021-01-06 DIAGNOSIS — J9601 Acute respiratory failure with hypoxia: Secondary | ICD-10-CM | POA: Diagnosis not present

## 2021-01-06 DIAGNOSIS — J9602 Acute respiratory failure with hypercapnia: Secondary | ICD-10-CM | POA: Diagnosis not present

## 2021-01-06 LAB — BASIC METABOLIC PANEL
Anion gap: 12 (ref 5–15)
BUN: 23 mg/dL (ref 8–23)
CO2: 40 mmol/L — ABNORMAL HIGH (ref 22–32)
Calcium: 8.7 mg/dL — ABNORMAL LOW (ref 8.9–10.3)
Chloride: 85 mmol/L — ABNORMAL LOW (ref 98–111)
Creatinine, Ser: 0.76 mg/dL (ref 0.61–1.24)
GFR, Estimated: >60 mL/min (ref >60)
Glucose, Bld: 103 mg/dL — ABNORMAL HIGH (ref 70–99)
Potassium: 2.9 mmol/L — ABNORMAL LOW (ref 3.5–5.1)
Sodium: 137 mmol/L (ref 135–145)

## 2021-01-06 LAB — GLUCOSE, CAPILLARY
Glucose-Capillary: 107 mg/dL — ABNORMAL HIGH (ref 70–99)
Glucose-Capillary: 107 mg/dL — ABNORMAL HIGH (ref 70–99)

## 2021-01-06 MED ORDER — POTASSIUM CHLORIDE CRYS ER 20 MEQ PO TBCR
40.0000 meq | EXTENDED_RELEASE_TABLET | ORAL | Status: AC
Start: 2021-01-06 — End: 2021-01-06
  Administered 2021-01-06 (×3): 40 meq via ORAL
  Filled 2021-01-06 (×3): qty 2

## 2021-01-06 MED ORDER — POTASSIUM CHLORIDE CRYS ER 20 MEQ PO TBCR: 40.0000 meq | EXTENDED_RELEASE_TABLET | Freq: Every day | ORAL | Status: DC

## 2021-01-06 MED FILL — Losartan Potassium Tab 25 MG: ORAL | 90 days supply | Qty: 90 | Fill #2 | Status: AC

## 2021-01-06 NOTE — Progress Notes (Signed)
  Mobility Specialist Criteria Algorithm Info.  Mobility Team: HOB elevated: Activity: Ambulated in hall; Transferred:  Bed to chair Range of motion: Active; All extremities Level of assistance: Modified independent, requires aide device or extra time Assistive device: Front wheel walker Minutes sitting in chair:  Minutes stood:  Minutes ambulated: 5 minutes Distance ambulated (ft): 240 ft Mobility response: Tolerated well Bed Position: Chair  Patient received dangling EOB eager to ambulate in hallway. Stood with supervision and ambulated in hallway Mod I with steady gait using RW. Sat in recliner chair upon returning to room. Tolerated ambulation well without complaint or incident and was left in recliner chair with all needs met.   HR pre: 93 bpm SPO2 pre: 93% 2LO2  HR during: 115-127 bpm SPO2 during: 91% 2LO2  HR post: 100 bpm SPO2 post: 95% 2LO2  01/06/2021 10:53 AM

## 2021-01-06 NOTE — Progress Notes (Signed)
Pt stated he doesn't want to wear CPAP for the night. Pt is on 4L HF [place]. No distress noted.

## 2021-01-06 NOTE — Progress Notes (Addendum)
PROGRESS NOTE    Garrett Munoz  VZD:[id number] DOB: January 28, 1946 DOA: 12/29/2020 PCP: [name], MD   Brief Narrative:  75 yo male with known COPD, pAFib, diastolic heart failure admitted with acute hypercarbic respiratory failure secondary to pneumonia.   Patient follows with Dr [name] for pulmonary outpatient for upper airway cough syndrome and dyspnea on exertion since Nov 2021. Patient presented to ED today for complaints of shortness of breath.  Vital signs on presentation: Temp 100.6, HR 109, RR 22, SpO2 88% on 6 L [place].  He was increased to 10 L  with improvement in oxygenation.  Imaging with new right lower lobe consolidation. Patient was started on antibiotics with Azithromycin and Rocephin.  Patient was later found to be unresponsive and ABG revealed hypercarbic respiratory failure with ABG: 7/119/91/34. He required intubation and mechanical ventilation. Work up for infection is in progress. Antibiotics have been broadened to Cefepime.    At time of eval patient remains somnolent saturating in low 90s on BIPAP, wife at bedside, states he has not felt well for past 10 months.  Assessment & Plan:   Principal Problem:   Acute respiratory failure with hypoxia and hypercapnia (HCC) Active Problems:   Morbid obesity ([place])   Persistent atrial fibrillation (HCC)   Acute metabolic encephalopathy   CHF NYHA class III, acute on chronic, diastolic (HCC)   Polymyalgia rheumatica (HCC)   Sepsis due to pneumonia ([place])   Respiratory failure ([place])  Acute hypoxic and hypercapnic respiratory failure secondary to aspiration pneumonia: Intubated 12/29/2020, extubated 12/31/2020.  Completed 5 days of antibiotics today.  Acute on chronic diastolic congestive heart failure: Echo completed on 12/30/2020 shows ejection fraction of 55 to 50%, no different than previous echo.   Chest x-ray 01/03/2021 still shows pulmonary edema but improved compared to the x-ray 5 days ago.  Patient down to 2 L oxygen.   Checks x-ray repeated today 01/06/2021 once again shows continued improvement.  His bilateral lower extremity edema which is still +2 but has improved significantly compared to yesterday and he has had significant diuresis.  Anticipate needing at least 2 more days of aggressive diuresis with Lasix 60 mg IV twice daily.  Patient and family aware.  Permanent atrial fibrillation: Rates fairly controlled.  Continue Coreg and Eliquis.  Hypokalemia: We will replace.  Essential hypertension: Controlled, continue losartan.  Hypophosphatemia: Resolved.  Acute thrombocytopenia: Likely secondary to acute illness.  No signs of bleeding.  Platelets improving.  Prediabetes: Continue SSI.  Diarrhea/IBS: Improved.  Continue Imodium.  Generalized deconditioning/debility: Seen by PT OT.  Back at baseline.  Plan to discharge home once medically optimized.  DVT prophylaxis: SCDs Start: 12/29/20 1536   Code Status: Full Code  Family Communication: Wife present at bedside.  Plan of care discussed with patient in length and he verbalized understanding and agreed with it.  Status is: Inpatient  Remains inpatient appropriate because:Inpatient level of care appropriate due to severity of illness  Dispo: The patient is from: Home              Anticipated d/c is to: Home              Patient currently is not medically stable to d/c.   Difficult to place patient No        Estimated body mass index is 42.47 kg/m as calculated from the following:   Height as of this encounter: _0  (1.803 m).   Weight as of this encounter: 138.1 kg.  Nutritional Assessment: Body mass index is 42.47 kg/m.[name] Seen by dietician.  I agree with the assessment and plan as outlined below: Nutrition Status: Nutrition Problem: Inadequate oral intake Etiology: inability to eat Signs/Symptoms: NPO status Interventions: Tube feeding, Prostat  .  Skin Assessment: I have examined the patient's skin and I agree with  the wound assessment as performed by the wound care RN as outlined below:    Consultants:  None  Procedures:  Intubation  Antimicrobials:  Anti-infectives (From admission, onward)    Start     Dose/Rate Route Frequency Ordered Stop   01/02/21 1000  azithromycin (ZITHROMAX) tablet 500 mg        500 mg Oral Daily 01/01/21 1040 01/02/21 0915   01/01/21 0900  amoxicillin-clavulanate (AUGMENTIN) 875-125 MG per tablet 1 tablet        1 tablet Oral Every 12 hours 01/01/21 0819 01/03/21 2111   12/30/20 0900  cefTRIAXone (ROCEPHIN) 2 g in sodium chloride 0.9 % 100 mL IVPB  Status:  Discontinued        2 g 200 mL/hr over 30 Minutes Intravenous Every 24 hours 12/29/20 1656 01/01/21 0819   12/29/20 1600  ceFEPIme (MAXIPIME) 2 g in sodium chloride 0.9 % 100 mL IVPB  Status:  Discontinued        2 g 200 mL/hr over 30 Minutes Intravenous Every 8 hours 12/29/20 1550 12/29/20 1656   12/29/20 0900  cefTRIAXone (ROCEPHIN) 2 g in sodium chloride 0.9 % 100 mL IVPB  Status:  Discontinued        2 g 200 mL/hr over 30 Minutes Intravenous Every 24 hours 12/29/20 0852 12/29/20 1550   12/29/20 0900  azithromycin (ZITHROMAX) 500 mg in sodium chloride 0.9 % 250 mL IVPB  Status:  Discontinued        500 mg 250 mL/hr over 60 Minutes Intravenous Every 24 hours 12/29/20 0852 01/01/21 1040          Subjective: Seen and examined.  Denies any shortness of breath or any other complaint.  Wife at the bedside.  Objective: Vitals:   01/05/21 2100 01/05/21 2333 01/06/21 0135 01/06/21 0425  BP: (!) 142/87 100/69  (!) 101/59  Pulse: 96 100  89  Resp: 20 20  (!) 22  Temp:  (!) 97.5 F (36.4 C)  98.1 F (36.7 C)  TempSrc:  Oral  Oral  SpO2: 92% 93%  92%  Weight:   (!) 138.1 kg   Height:        Intake/Output Summary (Last 24 hours) at 01/06/2021 1022 Last data filed at 01/06/2021 0911 Gross per 24 hour  Intake 418 ml  Output 2825 ml  Net -2407 ml    Filed Weights   01/02/21 0500 01/04/21 2328 01/06/21  0135  Weight: (!) 146 kg (!) 140 kg (!) 138.1 kg    Examination:  General exam: Appears calm and comfortable  Respiratory system: Bibasilar faint crackles respiratory effort normal. Cardiovascular system: S1 & S2 heard, RRR. No JVD, murmurs, rubs, gallops or clicks.  +2 pitting edema bilateral lower extremity Gastrointestinal system: Abdomen is nondistended, soft and nontender. No organomegaly or masses felt. Normal bowel sounds heard. Central nervous system: Alert and oriented. No focal neurological deficits. Extremities: Symmetric 5 x 5 power. Skin: No rashes, lesions or ulcers.  Psychiatry: Judgement and insight appear normal. Mood & affect appropriate.   Data Reviewed: I have personally reviewed following labs and imaging studies  CBC: Recent Labs  Lab 12/31/20 0419 01/01/21 0334 01/02/21  6767 01/03/21 0210 01/05/21 0815  WBC 13.7* 9.5 7.5 10.1 10.3  HGB 15.7 14.5 14.9 15.5 16.1  HCT 46.8 45.1 47.3 48.0 51.0  MCV 95.3 99.1 100.6* 99.0 99.2  PLT 123* 104* 122* 128* 209    Basic Metabolic Panel: Recent Labs  Lab 12/30/20 1500 12/31/20 0419 12/31/20 1240 01/01/21 0334 01/02/21 0331 01/03/21 0210 01/04/21 0201 01/05/21 0815 01/06/21 0351  NA 138 138   < > 141 140 142  --  140 137  K 3.0* 2.5*   < > 3.6 5.4* 4.0  --  3.8 2.9*  CL 96* 98   < > 102 98 94*  --  87* 85*  CO2 33* 32   < > 33* 37* 37*  --  43* 40*  GLUCOSE 122* 116*   < > 79 103* 118*  --  134* 103*  BUN 15 19   < > _0 --  23 23  CREATININE 1.07 1.02   < > 0.72 0.87 0.88  --  0.86 0.76  CALCIUM 7.9* 8.1*   < > 8.2* 8.8* 9.4  --  9.1 8.7*  MG  --  2.0  --  2.1 1.9 1.9 1.7  --   --   PHOS 3.2  --   --   --   --   --   --   --   --    < > = values in this interval not displayed.    GFR: Estimated Creatinine Clearance: 111.6 mL/min (by C-G formula based on SCr of 0.76 mg/dL). Liver Function Tests: Recent Labs  Lab 01/05/21 0815  AST 33  ALT 29  ALKPHOS 51  BILITOT 1.8*  PROT 6.3*   ALBUMIN 3.1*    No results for input(s): LIPASE, AMYLASE in the last 168 hours. No results for input(s): AMMONIA in the last 168 hours. Coagulation Profile: No results for input(s): INR, PROTIME in the last 168 hours.  Cardiac Enzymes: No results for input(s): CKTOTAL, CKMB, CKMBINDEX, TROPONINI in the last 168 hours. BNP (last 3 results) No results for input(s): PROBNP in the last 8760 hours. HbA1C: No results for input(s): HGBA1C in the last 72 hours. CBG: Recent Labs  Lab 01/03/21 1125 01/05/21 1057 01/05/21 1555 01/05/21 2101 01/06/21 0613  GLUCAP 123* 113* 109* 114* 107*    Lipid Profile: No results for input(s): CHOL, HDL, LDLCALC, TRIG, CHOLHDL, LDLDIRECT in the last 72 hours. Thyroid Function Tests: No results for input(s): TSH, T4TOTAL, FREET4, T3FREE, THYROIDAB in the last 72 hours. Anemia Panel: No results for input(s): VITAMINB12, FOLATE, FERRITIN, TIBC, IRON, RETICCTPCT in the last 72 hours. Sepsis Labs: No results for input(s): PROCALCITON, LATICACIDVEN in the last 168 hours.   Recent Results (from the past 240 hour(s))  Blood Culture (routine x 2)     Status: None   Collection Time: 12/29/20  8:39 AM   Specimen: BLOOD  Result Value Ref Range Status   Specimen Description BLOOD RIGHT ANTECUBITAL  Final   Special Requests   Final    BOTTLES DRAWN AEROBIC AND ANAEROBIC Blood Culture results may not be optimal due to an excessive volume of blood received in culture bottles   Culture   Final    NO GROWTH 5 DAYS Performed at [facility], [place] [address], [place], [place] [address]    Report Status 01/03/2021 FINAL  Final  Blood Culture (routine x 2)     Status: None   Collection Time: 12/29/20  8:51 AM   Specimen: BLOOD  Result Value Ref Range Status   Specimen Description BLOOD SITE NOT SPECIFIED  Final   Special Requests   Final    BOTTLES DRAWN AEROBIC AND ANAEROBIC Blood Culture adequate volume   Culture   Final    NO GROWTH 5  DAYS Performed at [facility], [address], [place], [place] [address]    Report Status 01/03/2021 FINAL  Final  Resp Panel by RT-PCR (Flu A&B, Covid) Nasopharyngeal Swab     Status: None   Collection Time: 12/29/20  9:11 AM   Specimen: Nasopharyngeal Swab; Nasopharyngeal(NP) swabs in vial transport medium  Result Value Ref Range Status   SARS Coronavirus 2 by RT PCR NEGATIVE NEGATIVE Final    Comment: (NOTE) SARS-CoV-2 target nucleic acids are NOT DETECTED.  The SARS-CoV-2 RNA is generally detectable in upper respiratory specimens during the acute phase of infection. The lowest concentration of SARS-CoV-2 viral copies this assay can detect is 138 copies/mL. A negative result does not preclude SARS-Cov-2 infection and should not be used as the sole basis for treatment or other patient management decisions. A negative result may occur with  improper specimen collection/handling, submission of specimen other than nasopharyngeal swab, presence of viral mutation(s) within the areas targeted by this assay, and inadequate number of viral copies(<138 copies/mL). A negative result must be combined with clinical observations, patient history, and epidemiological information. The expected result is Negative.  Fact Sheet for Patients:  EntrepreneurPulse.com.au  Fact Sheet for Healthcare Providers:  IncredibleEmployment.be  This test is no t yet approved or cleared by the [place] FDA and  has been authorized for detection and/or diagnosis of SARS-CoV-2 by FDA under an Emergency Use Authorization (EUA). This EUA will remain  in effect (meaning this test can be used) for the duration of the COVID-19 declaration under Section 564(b)(1) of the Act, 21 U.S.C.section 360bbb-3(b)(1), unless the authorization is terminated  or revoked sooner.       Influenza A by PCR NEGATIVE NEGATIVE Final   Influenza B by PCR NEGATIVE NEGATIVE Final     Comment: (NOTE) The Xpert Xpress SARS-CoV-2/FLU/RSV plus assay is intended as an aid in the diagnosis of influenza from Nasopharyngeal swab specimens and should not be used as a sole basis for treatment. Nasal washings and aspirates are unacceptable for Xpert Xpress SARS-CoV-2/FLU/RSV testing.  Fact Sheet for Patients: EntrepreneurPulse.com.au  Fact Sheet for Healthcare Providers: IncredibleEmployment.be  This test is not yet approved or cleared by the [place] FDA and has been authorized for detection and/or diagnosis of SARS-CoV-2 by FDA under an Emergency Use Authorization (EUA). This EUA will remain in effect (meaning this test can be used) for the duration of the COVID-19 declaration under Section 564(b)(1) of the Act, 21 U.S.C. section 360bbb-3(b)(1), unless the authorization is terminated or revoked.  Performed at [facility], [place] [address] [place], [place] [address]   Urine Culture     Status: None   Collection Time: 12/29/20 10:41 AM   Specimen: Urine, Clean Catch  Result Value Ref Range Status   Specimen Description URINE, CLEAN CATCH  Final   Special Requests Normal  Final   Culture   Final    NO GROWTH Performed at [facility], [place] [address] [place], [place] [address]    Report Status 12/30/2020 FINAL  Final  Culture, Respiratory w Gram Stain     Status: None   Collection Time: 12/29/20  4:37 PM  Specimen: Bronchoalveolar Lavage; Respiratory  Result Value Ref Range Status   Specimen Description BRONCHIAL ALVEOLAR LAVAGE  Final   Special Requests NONE  Final   Gram Stain   Final    FEW SQUAMOUS EPITHELIAL CELLS PRESENT MODERATE WBC PRESENT,BOTH PMN AND MONONUCLEAR FEW GRAM POSITIVE COCCI    Culture   Final    FEW Normal respiratory flora-no Staph aureus or Pseudomonas seen Performed at [facility], [place] [address], [place], [place] [address]    Report Status 01/01/2021 FINAL  Final  MRSA  Next Gen by PCR, Nasal     Status: None   Collection Time: 12/29/20  7:00 PM   Specimen: Nasal Mucosa; Nasal Swab  Result Value Ref Range Status   MRSA by PCR Next Gen NOT DETECTED NOT DETECTED Final    Comment: (NOTE) The GeneXpert MRSA Assay (FDA approved for NASAL specimens only), is one component of a comprehensive MRSA colonization surveillance program. It is not intended to diagnose MRSA infection nor to guide or monitor treatment for MRSA infections. Test performance is not FDA approved in patients less than 11 years old. Performed at [facility], [place] [address], [place], [place] [address]        Radiology Studies: No results found.  Scheduled Meds:  apixaban  5 mg Oral BID   calcium citrate  200 mg of elemental calcium Oral Daily   carvedilol  6.25 mg Oral BID WC   chlorhexidine gluconate (MEDLINE KIT)  15 mL Mouth Rinse BID   Chlorhexidine Gluconate Cloth  6 each Topical Daily   cholecalciferol  500 Units Oral Daily   furosemide  60 mg Intravenous BID   guaiFENesin  300 mg Oral Q6H   losartan  25 mg Oral Daily   polyethylene glycol  17 g Oral Daily   potassium chloride  40 mEq Oral Q4H   Followed by   Derrill Memo ON 01/07/2021] potassium chloride  40 mEq Oral Daily   predniSONE  10 mg Oral Q breakfast   Continuous Infusions:   LOS: 8 days   Time spent: 26 minutes   Darliss Cheney, MD [facility]  01/06/2021, 10:22 AM  Please page via [place] [place] and do not message via secure chat for anything urgent. Secure chat can be used for anything non urgent.  How to contact the [facility] Attending or Consulting provider [place] or covering provider during after hours [place], for this patient?  Check the care team in [facility] and look for a) attending/consulting TR[facility] provider listed and b) the [facility] team listed. Page or secure chat [phone]. Log into [email or web address] and use [place]'s universal password to access. If you do not have the password, please contact the hospital  operator. Locate the [facility] provider you are looking for under [facility] and page to a number that you can be directly reached. If you still have difficulty reaching the provider, please page the [facility] (Director on Call) for the Hospitalists listed on amion for assistance.

## 2021-01-06 NOTE — Progress Notes (Signed)
Patient refusing to wear CPAP  tonight. Patient oxygen saturations dropping to low 80's on 2 liters  nasal canula. Oxygen increased to 4 liters to maintain oxygen saturations 86-93%. Will continue  to monitor.

## 2021-01-06 NOTE — Progress Notes (Signed)
Physical Therapy Treatment Patient Details Name: Garrett Munoz MRN: [id number] DOB: [birth year] Today's Date: 01/06/2021   History of Present Illness Pt is a 75 y.o. male admitted 12/29/20 with acute hypercarbic respiratory failure secondary to PNA. ETT 9/12-9/14. PMH includes COPD (not on home O2), afib, HF, OSA, gout, TKA.   PT Comments    Pt progressing with mobility. Today's session focused on gait and stair training, pt moving well with supervision for safety and SpO2 monitoring; pt demonstrates improving stability and activity tolerance. Pt hopeful for d/c home soon; will have necessary DME and family assist. If to remain admitted, will continue to follow acutely.  SpO2 89-94% on 2L O2 at rest SpO2 >/88% on 2L O2 with ambulation SpO2 >/93% on 3L O2 with ambulation    Recommendations for follow up therapy are one component of a multi-disciplinary discharge planning process, led by the attending physician.  Recommendations may be updated based on patient status, additional functional criteria and insurance authorization.  Follow Up Recommendations  Home health PT;Supervision for mobility/OOB     Equipment Recommendations  None recommended by PT    Recommendations for Other Services       Precautions / Restrictions Precautions Precautions: Fall;Other (comment) Precaution Comments: Watch SpO2 (does not wear O2 baseline) Restrictions Weight Bearing Restrictions: No     Mobility  Bed Mobility Overal bed mobility: Modified Independent Bed Mobility: Supine to Sit     Supine to sit: Modified independent (Device/Increase time);HOB elevated          Transfers Overall transfer level: Modified independent Equipment used: Rolling walker (2 wheeled) Transfers: Sit to/from Stand              Ambulation/Gait Ambulation/Gait assistance: Supervision Gait Distance (Feet): 350 Feet Assistive device: Rolling walker (2 wheeled) Gait Pattern/deviations: Step-through  pattern;Trunk flexed;Decreased stride length Gait velocity: Decreased   General Gait Details: Pt requesting use of RW. Slow, steady gait with RW and supervision for safety; improved DOE 2/4 noted, no standing rest breaks required   Stairs Stairs: Yes Stairs assistance: Supervision Stair Management: One rail Right;Step to pattern;Forwards Number of Stairs: 3 General stair comments: Ascend/descend 3 steps with single UE support; encouraged step-to for stability; pt declined additional trial with HHA since pt does not have rail support at home, reports he has something he can hold onto   Wheelchair Mobility    Modified Rankin (Stroke Patients Only)       Balance Overall balance assessment: Needs assistance Sitting-balance support: No upper extremity supported;Feet supported Sitting balance-Leahy Scale: Good     Standing balance support: Bilateral upper extremity supported;No upper extremity supported;During functional activity Standing balance-Leahy Scale: Fair Standing balance comment: able to static stand and take steps without UE support                            Cognition Arousal/Alertness: Awake/alert Behavior During Therapy: WFL for tasks assessed/performed Overall Cognitive Status: Within Functional Limits for tasks assessed                                        Exercises      General Comments General comments (skin integrity, edema, etc.): SpO2 down to 89% on 2L O2 during ambulation, >/93% on 3L with ambulation. Wife and daughter present at end of session, supportive. Reviewed educ, pt hopeful for d/c home tomorrow,  reports no further questions or concerns      Pertinent Vitals/Pain Pain Assessment: No/denies pain    Home Living Family/patient expects to be discharged to:: Private residence Living Arrangements: Spouse/significant other                  Prior Function            PT Goals (current goals can now be found  in the care plan section) Progress towards PT goals: Progressing toward goals    Frequency    Min 3X/week      PT Plan Current plan remains appropriate    Co-evaluation              AM-PAC PT "6 Clicks" Mobility   Outcome Measure  Help needed turning from your back to your side while in a flat bed without using bedrails?: None Help needed moving from lying on your back to sitting on the side of a flat bed without using bedrails?: None Help needed moving to and from a bed to a chair (including a wheelchair)?: None Help needed standing up from a chair using your arms (e.g., wheelchair or bedside chair)?: None Help needed to walk in hospital room?: A Little Help needed climbing 3-5 steps with a railing? : A Little 6 Click Score: 22    End of Session Equipment Utilized During Treatment: Gait belt;Oxygen Activity Tolerance: Patient tolerated treatment well Patient left: in chair;with call bell/phone within reach;with family/visitor present Nurse Communication: Mobility status PT Visit Diagnosis: Other abnormalities of gait and mobility (R26.89);Muscle weakness (generalized) (M62.81);Unsteadiness on feet (R26.81)     Time: 0932-6712 PT Time Calculation (min) (ACUTE ONLY): 21 min  Charges:  $Gait Training: 8-22 mins                     Mabeline Caras, PT, DPT Acute Rehabilitation Services  Pager [phone] Office [place] 01/06/2021, 1:36 PM

## 2021-01-07 ENCOUNTER — Telehealth: Payer: Self-pay | Admitting: Pharmacist

## 2021-01-07 DIAGNOSIS — J9602 Acute respiratory failure with hypercapnia: Secondary | ICD-10-CM | POA: Diagnosis not present

## 2021-01-07 DIAGNOSIS — J9601 Acute respiratory failure with hypoxia: Secondary | ICD-10-CM | POA: Diagnosis not present

## 2021-01-07 LAB — BASIC METABOLIC PANEL
Anion gap: 10 (ref 5–15)
BUN: 23 mg/dL (ref 8–23)
CO2: 40 mmol/L — ABNORMAL HIGH (ref 22–32)
Calcium: 8.9 mg/dL (ref 8.9–10.3)
Chloride: 89 mmol/L — ABNORMAL LOW (ref 98–111)
Creatinine, Ser: 0.78 mg/dL (ref 0.61–1.24)
GFR, Estimated: >60 mL/min (ref >60)
Glucose, Bld: 100 mg/dL — ABNORMAL HIGH (ref 70–99)
Potassium: 3.3 mmol/L — ABNORMAL LOW (ref 3.5–5.1)
Sodium: 139 mmol/L (ref 135–145)

## 2021-01-07 LAB — CBC
HCT: 48.5 % (ref 39.0–52.0)
Hemoglobin: 16.1 g/dL (ref 13.0–17.0)
MCH: 32.4 pg (ref 26.0–34.0)
MCHC: 33.2 g/dL (ref 30.0–36.0)
MCV: 97.6 fL (ref 80.0–100.0)
Platelets: 142 10*3/uL — ABNORMAL LOW (ref 150–400)
RBC: 4.97 MIL/uL (ref 4.22–5.81)
RDW: 13.4 % (ref 11.5–15.5)
WBC: 8.7 10*3/uL (ref 4.0–10.5)
nRBC: 0 % (ref 0.0–0.2)

## 2021-01-07 MED ORDER — ADULT MULTIVITAMIN W/MINERALS CH
1.0000 | ORAL_TABLET | Freq: Every day | ORAL | Status: DC
  Administered 2021-01-07 – 2021-01-08 (×2): 1 via ORAL
  Filled 2021-01-07 (×2): qty 1

## 2021-01-07 MED ORDER — POTASSIUM CHLORIDE CRYS ER 20 MEQ PO TBCR
40.0000 meq | EXTENDED_RELEASE_TABLET | Freq: Two times a day (BID) | ORAL | Status: DC
  Administered 2021-01-07 (×2): 40 meq via ORAL
  Filled 2021-01-07 (×2): qty 2

## 2021-01-07 NOTE — Plan of Care (Signed)
  Problem: Education: Goal: Knowledge of General Education information will improve Description Including pain rating scale, medication(s)/side effects and non-pharmacologic comfort measures Outcome: Progressing   

## 2021-01-07 NOTE — Progress Notes (Signed)
Nutrition Follow-up  DOCUMENTATION CODES:   Not applicable  INTERVENTION:   -Continue double protein portions with meals -Continue Magic cup BID with meals, each supplement provides 290 kcal and 9 grams of protein  -MVI with minerals daily  NUTRITION DIAGNOSIS:   Inadequate oral intake related to inability to eat as evidenced by NPO status.  Progressing; advanced to PO diet on 12/31/20  GOAL:   Patient will meet greater than or equal to 90% of their needs  Progressing  MONITOR:   PO intake, Diet advancement, Supplement acceptance  REASON FOR ASSESSMENT:   Ventilator, Consult Enteral/tube feeding initiation and management  ASSESSMENT:   75 year old male who presented to the ED on 9/12 with SOB. PMH of HTN, HLD, atrial fibrillation, COPD, CHF, PMR on chronic steroids. Pt admitted with acute respiratory failure secondary to pneumonia. Pt required intubation.  9/12: Intubation 9/14: Extubated; SLP evaluation- D1 w/ Nectar Thick Liquids  9/15: s/p FEES- D3 diet w/ Honey thick liquids  Reviewed I/O's: -1.3 L x 24 hours and -7.4 L since admission  Pt's intake continues to improve. Noted meal completions 25-100%. He is tolerating current diet texture well.   Per therapy notes, recommending home health services once medically stable for discharge.   Medications reviewed and include calcium citrate, miralax, KCl, and prednisone.   Labs reviewed: K: 3.3 (on PO supplementation), CBGS: 107.  Diet Order:   Diet Order             DIET DYS 3 Room service appropriate? Yes with Assist; Fluid consistency: Honey Thick  Diet effective now                   EDUCATION NEEDS:   No education needs have been identified at this time  Skin:  Skin Assessment: Skin Integrity Issues: Skin Integrity Issues:: Other (Comment) Other: Moisture Associated Skin Damage - Buttocks & Thighs  Last BM:  01/05/21  Height:   Ht Readings from Last 1 Encounters:  01/04/21 5\' 11"  (1.803 m)     Weight:   Wt Readings from Last 1 Encounters:  01/07/21 135.2 kg    Ideal Body Weight:  75.5 kg  BMI:  Body mass index is 41.58 kg/m.  Estimated Nutritional Needs:   Kcal:  2100-2300  Protein:  110-130 gm  Fluid:  >/= 2.1 L    Loistine Chance, RD, LDN, [place] Registered Dietitian II Certified Diabetes Care and Education Specialist Please refer to [facility] for RD and/or RD on-call/weekend/after hours pager

## 2021-01-07 NOTE — Plan of Care (Signed)

## 2021-01-07 NOTE — Progress Notes (Signed)
PROGRESS NOTE    Garrett Munoz  UXN:[id number] DOB: [birth year] DOA: 12/29/2020 PCP: [name], MD   Brief Narrative:  75 yo male with known COPD, pAFib, diastolic heart failure admitted with acute hypercarbic respiratory failure secondary to pneumonia.   Patient follows with Dr [name] for pulmonary outpatient for upper airway cough syndrome and dyspnea on exertion since Nov 2021. Patient presented to ED today for complaints of shortness of breath.  Vital signs on presentation: Temp 100.6, HR 109, RR 22, SpO2 88% on 6 L [place].  He was increased to 10 L [place] with improvement in oxygenation.  Imaging with new right lower lobe consolidation. Patient was started on antibiotics with Azithromycin and Rocephin.  Patient was later found to be unresponsive and ABG revealed hypercarbic respiratory failure with ABG: 7/119/91/34. He required intubation and mechanical ventilation. Work up for infection is in progress. Antibiotics have been broadened to Cefepime.    At time of eval patient remains somnolent saturating in low 90s on BIPAP, wife at bedside, states he has not felt well for past 10 months.  Assessment & Plan:   Principal Problem:   Acute respiratory failure with hypoxia and hypercapnia (HCC) Active Problems:   Morbid obesity ([place])   Persistent atrial fibrillation (HCC)   Acute metabolic encephalopathy   CHF NYHA class III, acute on chronic, diastolic (HCC)   Polymyalgia rheumatica (HCC)   Sepsis due to pneumonia ([place])   Respiratory failure ([place])  Acute hypoxic and hypercapnic respiratory failure secondary to aspiration pneumonia: Intubated 12/29/2020, extubated 12/31/2020.  Completed 5 days of antibiotics today.  Acute on chronic diastolic congestive heart failure: Echo completed on 12/30/2020 shows ejection fraction of 55 to 50%, no different than previous echo.   Chest x-ray 01/03/2021 still shows pulmonary edema but improved compared to the x-ray 5 days ago.  Patient down to room air  but saturation 90% at rest, he will likely require some oxygen with exertion.  Checks x-ray repeated today 01/06/2021 once again shows continued improvement.  His bilateral lower extremity edema has improved significantly, currently +1 only.  Good diuresis.  Continue Lasix 60 mg IV twice daily.  Permanent atrial fibrillation: Rates fairly controlled.  Continue Coreg and Eliquis.  Hypokalemia: We will replace.  Essential hypertension: Controlled, continue losartan.  Hypophosphatemia: Resolved.  Acute thrombocytopenia: Likely secondary to acute illness.  No signs of bleeding.  Platelets improving.  Prediabetes: Continue SSI.  Diarrhea/IBS: Improved.  Continue Imodium.  Generalized deconditioning/debility: Seen by PT OT.  Back at baseline.  Plan to discharge home once medically optimized.  DVT prophylaxis: SCDs Start: 12/29/20 1536   Code Status: Full Code  Family Communication: Wife present at bedside.  Plan of care discussed with patient in length and he verbalized understanding and agreed with it.  Status is: Inpatient  Remains inpatient appropriate because:Inpatient level of care appropriate due to severity of illness  Dispo: The patient is from: Home              Anticipated d/c is to: Home              Patient currently is not medically stable to d/c.   Difficult to place patient No        Estimated body mass index is 41.58 kg/m as calculated from the following:   Height as of this encounter: _0  (1.803 m).   Weight as of this encounter: 135.2 kg.     Nutritional Assessment: Body mass index is 41.58 kg/m.[name] Seen  by dietician.  I agree with the assessment and plan as outlined below: Nutrition Status: Nutrition Problem: Inadequate oral intake Etiology: inability to eat Signs/Symptoms: NPO status Interventions: Tube feeding, Prostat  .  Skin Assessment: I have examined the patient's skin and I agree with the wound assessment as performed by the wound care RN  as outlined below:    Consultants:  None  Procedures:  Intubation  Antimicrobials:  Anti-infectives (From admission, onward)    Start     Dose/Rate Route Frequency Ordered Stop   01/02/21 1000  azithromycin (ZITHROMAX) tablet 500 mg        500 mg Oral Daily 01/01/21 1040 01/02/21 0915   01/01/21 0900  amoxicillin-clavulanate (AUGMENTIN) 875-125 MG per tablet 1 tablet        1 tablet Oral Every 12 hours 01/01/21 0819 01/03/21 2111   12/30/20 0900  cefTRIAXone (ROCEPHIN) 2 g in sodium chloride 0.9 % 100 mL IVPB  Status:  Discontinued        2 g 200 mL/hr over 30 Minutes Intravenous Every 24 hours 12/29/20 1656 01/01/21 0819   12/29/20 1600  ceFEPIme (MAXIPIME) 2 g in sodium chloride 0.9 % 100 mL IVPB  Status:  Discontinued        2 g 200 mL/hr over 30 Minutes Intravenous Every 8 hours 12/29/20 1550 12/29/20 1656   12/29/20 0900  cefTRIAXone (ROCEPHIN) 2 g in sodium chloride 0.9 % 100 mL IVPB  Status:  Discontinued        2 g 200 mL/hr over 30 Minutes Intravenous Every 24 hours 12/29/20 0852 12/29/20 1550   12/29/20 0900  azithromycin (ZITHROMAX) 500 mg in sodium chloride 0.9 % 250 mL IVPB  Status:  Discontinued        500 mg 250 mL/hr over 60 Minutes Intravenous Every 24 hours 12/29/20 0852 01/01/21 1040          Subjective: Seen and examined.  Wife and sister at the bedside.  He denies any shortness of breath.  No complaints.  Objective: Vitals:   01/06/21 0425 01/06/21 1102 01/06/21 2300 01/07/21 0445  BP: (!) 101/59 124/88 111/72 123/79  Pulse: 89 99 88 (!) 57  Resp: (!) 22 20 (!) 21 18  Temp: 98.1 F (36.7 C) 97.9 F (36.6 C) 98.5 F (36.9 C) (!) 97.5 F (36.4 C)  TempSrc: Oral Oral Oral Oral  SpO2: 92% 94% 92% 95%  Weight:    135.2 kg  Height:        Intake/Output Summary (Last 24 hours) at 01/07/2021 0951 Last data filed at 01/07/2021 7001 Gross per 24 hour  Intake 716 ml  Output 1300 ml  Net -584 ml    Filed Weights   01/04/21 2328 01/06/21 0135  01/07/21 0445  Weight: (!) 140 kg (!) 138.1 kg 135.2 kg    Examination:  General exam: Appears calm and comfortable, obese Respiratory system: Clear to auscultation. Respiratory effort normal. Cardiovascular system: S1 & S2 heard, RRR. No JVD, murmurs, rubs, gallops or clicks.  +1 pitting edema bilateral lower extremity Gastrointestinal system: Abdomen is nondistended, soft and nontender. No organomegaly or masses felt. Normal bowel sounds heard. Central nervous system: Alert and oriented. No focal neurological deficits. Extremities: Symmetric 5 x 5 power. Skin: No rashes, lesions or ulcers.  Psychiatry: Judgement and insight appear normal. Mood & affect appropriate.    Data Reviewed: I have personally reviewed following labs and imaging studies  CBC: Recent Labs  Lab 01/01/21 0334 01/02/21 0331 01/03/21 0210  01/05/21 0815 01/07/21 0334  WBC 9.5 7.5 10.1 10.3 8.7  HGB 14.5 14.9 15.5 16.1 16.1  HCT 45.1 47.3 48.0 51.0 48.5  MCV 99.1 100.6* 99.0 99.2 97.6  PLT 104* 122* 128* 165 142*    Basic Metabolic Panel: Recent Labs  Lab 01/01/21 0334 01/02/21 0331 01/03/21 0210 01/04/21 0201 01/05/21 0815 01/06/21 0351 01/07/21 0334  NA 141 140 142  --  140 137 139  K 3.6 5.4* 4.0  --  3.8 2.9* 3.3*  CL 102 98 94*  --  87* 85* 89*  CO2 33* 37* 37*  --  43* 40* 40*  GLUCOSE 79 103* 118*  --  134* 103* 100*  BUN _0 --  _1 CREATININE 0.72 0.87 0.88  --  0.86 0.76 0.78  CALCIUM 8.2* 8.8* 9.4  --  9.1 8.7* 8.9  MG 2.1 1.9 1.9 1.7  --   --   --     GFR: Estimated Creatinine Clearance: 110.3 mL/min (by C-G formula based on SCr of 0.78 mg/dL). Liver Function Tests: Recent Labs  Lab 01/05/21 0815  AST 33  ALT 29  ALKPHOS 51  BILITOT 1.8*  PROT 6.3*  ALBUMIN 3.1*    No results for input(s): LIPASE, AMYLASE in the last 168 hours. No results for input(s): AMMONIA in the last 168 hours. Coagulation Profile: No results for input(s): INR, PROTIME in the last  168 hours.  Cardiac Enzymes: No results for input(s): CKTOTAL, CKMB, CKMBINDEX, TROPONINI in the last 168 hours. BNP (last 3 results) No results for input(s): PROBNP in the last 8760 hours. HbA1C: No results for input(s): HGBA1C in the last 72 hours. CBG: Recent Labs  Lab 01/05/21 1057 01/05/21 1555 01/05/21 2101 01/06/21 0613 01/06/21 1059  GLUCAP 113* 109* 114* 107* 107*    Lipid Profile: No results for input(s): CHOL, HDL, LDLCALC, TRIG, CHOLHDL, LDLDIRECT in the last 72 hours. Thyroid Function Tests: No results for input(s): TSH, T4TOTAL, FREET4, T3FREE, THYROIDAB in the last 72 hours. Anemia Panel: No results for input(s): VITAMINB12, FOLATE, FERRITIN, TIBC, IRON, RETICCTPCT in the last 72 hours. Sepsis Labs: No results for input(s): PROCALCITON, LATICACIDVEN in the last 168 hours.   Recent Results (from the past 240 hour(s))  Blood Culture (routine x 2)     Status: None   Collection Time: 12/29/20  8:39 AM   Specimen: BLOOD  Result Value Ref Range Status   Specimen Description BLOOD RIGHT ANTECUBITAL  Final   Special Requests   Final    BOTTLES DRAWN AEROBIC AND ANAEROBIC Blood Culture results may not be optimal due to an excessive volume of blood received in culture bottles   Culture   Final    NO GROWTH 5 DAYS Performed at [facility], [place] [address], [place], [place] [address]    Report Status 01/03/2021 FINAL  Final  Blood Culture (routine x 2)     Status: None   Collection Time: 12/29/20  8:51 AM   Specimen: BLOOD  Result Value Ref Range Status   Specimen Description BLOOD SITE NOT SPECIFIED  Final   Special Requests   Final    BOTTLES DRAWN AEROBIC AND ANAEROBIC Blood Culture adequate volume   Culture   Final    NO GROWTH 5 DAYS Performed at [facility], [place] [address] [place], [place] [address]    Report Status 01/03/2021 FINAL  Final  Resp Panel by RT-PCR (Flu A&B, Covid) Nasopharyngeal Swab  Status: None   Collection Time:  12/29/20  9:11 AM   Specimen: Nasopharyngeal Swab; Nasopharyngeal(NP) swabs in vial transport medium  Result Value Ref Range Status   SARS Coronavirus 2 by RT PCR NEGATIVE NEGATIVE Final    Comment: (NOTE) SARS-CoV-2 target nucleic acids are NOT DETECTED.  The SARS-CoV-2 RNA is generally detectable in upper respiratory specimens during the acute phase of infection. The lowest concentration of SARS-CoV-2 viral copies this assay can detect is 138 copies/mL. A negative result does not preclude SARS-Cov-2 infection and should not be used as the sole basis for treatment or other patient management decisions. A negative result may occur with  improper specimen collection/handling, submission of specimen other than nasopharyngeal swab, presence of viral mutation(s) within the areas targeted by this assay, and inadequate number of viral copies(<138 copies/mL). A negative result must be combined with clinical observations, patient history, and epidemiological information. The expected result is Negative.  Fact Sheet for Patients:  EntrepreneurPulse.com.au  Fact Sheet for Healthcare Providers:  IncredibleEmployment.be  This test is no t yet approved or cleared by the [place] FDA and  has been authorized for detection and/or diagnosis of SARS-CoV-2 by FDA under an Emergency Use Authorization (EUA). This EUA will remain  in effect (meaning this test can be used) for the duration of the COVID-19 declaration under Section 564(b)(1) of the Act, 21 U.S.C.section 360bbb-3(b)(1), unless the authorization is terminated  or revoked sooner.       Influenza A by PCR NEGATIVE NEGATIVE Final   Influenza B by PCR NEGATIVE NEGATIVE Final    Comment: (NOTE) The Xpert Xpress SARS-CoV-2/FLU/RSV plus assay is intended as an aid in the diagnosis of influenza from Nasopharyngeal swab specimens and should not be used as a sole basis for treatment. Nasal washings  and aspirates are unacceptable for Xpert Xpress SARS-CoV-2/FLU/RSV testing.  Fact Sheet for Patients: EntrepreneurPulse.com.au  Fact Sheet for Healthcare Providers: IncredibleEmployment.be  This test is not yet approved or cleared by the [place] FDA and has been authorized for detection and/or diagnosis of SARS-CoV-2 by FDA under an Emergency Use Authorization (EUA). This EUA will remain in effect (meaning this test can be used) for the duration of the COVID-19 declaration under Section 564(b)(1) of the Act, 21 U.S.C. section 360bbb-3(b)(1), unless the authorization is terminated or revoked.  Performed at [facility], [place] [address], [place], [place] [address]   Urine Culture     Status: None   Collection Time: 12/29/20 10:41 AM   Specimen: Urine, Clean Catch  Result Value Ref Range Status   Specimen Description URINE, CLEAN CATCH  Final   Special Requests Normal  Final   Culture   Final    NO GROWTH Performed at [facility], [place] [address], [place], [place] [address]    Report Status 12/30/2020 FINAL  Final  Culture, Respiratory w Gram Stain     Status: None   Collection Time: 12/29/20  4:37 PM   Specimen: Bronchoalveolar Lavage; Respiratory  Result Value Ref Range Status   Specimen Description BRONCHIAL ALVEOLAR LAVAGE  Final   Special Requests NONE  Final   Gram Stain   Final    FEW SQUAMOUS EPITHELIAL CELLS PRESENT MODERATE WBC PRESENT,BOTH PMN AND MONONUCLEAR FEW GRAM POSITIVE COCCI    Culture   Final    FEW Normal respiratory flora-no Staph aureus or Pseudomonas seen Performed at [facility], [place] [address] [place], [place] [address]    Report Status  01/01/2021 FINAL  Final  MRSA Next Gen by PCR, Nasal     Status: None   Collection Time: 12/29/20  7:00 PM   Specimen: Nasal Mucosa; Nasal Swab  Result Value Ref Range Status   MRSA by PCR Next Gen NOT DETECTED NOT DETECTED Final    Comment:  (NOTE) The GeneXpert MRSA Assay (FDA approved for NASAL specimens only), is one component of a comprehensive MRSA colonization surveillance program. It is not intended to diagnose MRSA infection nor to guide or monitor treatment for MRSA infections. Test performance is not FDA approved in patients less than 88 years old. Performed at [facility], [place] [address] [place], [place] [address]        Radiology Studies: DG CHEST PORT 1 VIEW  Result Date: 01/06/2021 CLINICAL DATA:  Congestive heart failure. EXAM: PORTABLE CHEST 1 VIEW COMPARISON:  01/03/2021 FINDINGS: Cardiomegaly. Aortic atherosclerosis. Chronic bronchial thickening. No pulmonary edema, consolidation, collapse or effusion. IMPRESSION: Cardiomegaly and aortic atherosclerosis. Chronic bronchial thickening. No change or other active process. Electronically Signed   By: Nelson Chimes M.D.   On: 01/06/2021 11:09    Scheduled Meds:  apixaban  5 mg Oral BID   calcium citrate  200 mg of elemental calcium Oral Daily   carvedilol  6.25 mg Oral BID WC   chlorhexidine gluconate (MEDLINE KIT)  15 mL Mouth Rinse BID   Chlorhexidine Gluconate Cloth  6 each Topical Daily   cholecalciferol  500 Units Oral Daily   furosemide  60 mg Intravenous BID   guaiFENesin  300 mg Oral Q6H   losartan  25 mg Oral Daily   polyethylene glycol  17 g Oral Daily   potassium chloride  40 mEq Oral BID   predniSONE  10 mg Oral Q breakfast   Continuous Infusions:   LOS: 9 days   Time spent: 27 minutes   Darliss Cheney, MD [facility]  01/07/2021, 9:51 AM  Please page via [place] and do not message via secure chat for anything urgent. Secure chat can be used for anything non urgent.  How to contact the [facility] Attending or Consulting provider [place] or covering provider during after hours [place], for this patient?  Check the care team in [facility] and look for a) attending/consulting [facility] provider listed and b) the [facility] team listed. Page or secure chat  7A-7P. Log into [email or web address] and use [place]'s universal password to access. If you do not have the password, please contact the hospital operator. Locate the [facility] provider you are looking for under [facility] and page to a number that you can be directly reached. If you still have difficulty reaching the provider, please page the [facility] (Director on Call) for the Hospitalists listed on amion for assistance.

## 2021-01-07 NOTE — Progress Notes (Addendum)
Chronic Care Management Pharmacy Assistant   Name: Garrett Munoz  MRN: [id number] DOB: [birth year]  Reason for Encounter: General Disease State Call   Conditions to be addressed/monitored: hypertension, atrial fibrillation, chronic pain  Recent office visits:  None since 09/18/20  Recent consult visits: 12/[facility]/20 [facility] Valentina Shaggy, MD. STARTED Azelastine HCL 0.1% 1 spray each nare daily.  12/09/20 Otolaryngology Beckie Salts, MD For follow-up.  11/27/20 Pulmonary Wert, Christena Deem, MD. For follow-up. No medication changes.  11/06/20 Pulmonary Wert, Christena Deem, MD. For follow-up. COMPLETED Benzonatate. 10/16/20 Pulmonary Wert, Christena Deem, MD. For follow-up. STARTED Budesonide-Formoterol Furmarate 80-4.5 MCG/ACT 2 puffs inhalation 2 times daily. COMPLETED Azithromycin 250 mg.   Hospital visits:  12/29/20 [facility] (9 Days-Current) For acute respiratory failure with hypoxia and hypercapnia.   Medications: Facility-Administered Encounter Medications as of 01/07/2021  Medication   apixaban (ELIQUIS) tablet 5 mg   calcium citrate (CALCITRATE - dosed in mg elemental calcium) tablet 200 mg of elemental calcium   carvedilol (COREG) tablet 6.25 mg   chlorhexidine gluconate (MEDLINE KIT) (PERIDEX) 0.12 % solution 15 mL   Chlorhexidine Gluconate Cloth 2 % PADS 6 each   cholecalciferol (VITAMIN D3) tablet 500 Units   food thickener (SIMPLYTHICK (HONEY/LEVEL 3/MODERATELY THICK)) 1 packet   furosemide (LASIX) injection 60 mg   guaiFENesin tablet 300 mg   HYDROcodone-acetaminophen (NORCO/VICODIN) 5-325 MG per tablet 1-2 tablet   loperamide (IMODIUM) capsule 4 mg   losartan (COZAAR) tablet 25 mg   magic mouthwash w/lidocaine   polyethylene glycol (MIRALAX / GLYCOLAX) packet 17 g   polyethylene glycol (MIRALAX / GLYCOLAX) packet 17 g   potassium chloride SA (KLOR-CON) CR tablet 40 mEq   predniSONE (DELTASONE) tablet 10 mg   temazepam (RESTORIL)  capsule 30 mg   Outpatient Encounter Medications as of 01/07/2021  Medication Sig   apixaban (ELIQUIS) 5 MG TABS tablet Take 1 tablet (5 mg total) by mouth 2 (two) times daily.   albuterol (ACCUNEB) 1.25 MG/3ML nebulizer solution Take 1 ampule by nebulization every 4 (four) hours as needed for wheezing.   albuterol (VENTOLIN HFA) 108 (90 Base) MCG/ACT inhaler Inhale 2 puffs into the lungs every 6 (six) hours as needed for wheezing or shortness of breath.   allopurinol (ZYLOPRIM) 300 MG tablet TAKE 1 TABLET (300 MG TOTAL) BY MOUTH DAILY. (Patient not taking: Reported on 12/29/2020)   azelastine (ASTELIN) 0.1 % nasal spray Place 1 spray into both nostrils daily. Use in each nostril as directed (Patient taking differently: Place 1 spray into both nostrils daily as needed for rhinitis.)   carvedilol (COREG) 12.5 MG tablet TAKE 1 & 1/2 TABLETS BY MOUTH EVERY MORNING AND 1 TABLET IN THE EVENING (Patient taking differently: Take 6.25-12.5 mg by mouth See admin instructions. Take half tablet by mouth in the morning and then take 1 whole tablet by mouth in the evening per wife)   fluticasone (FLONASE) 50 MCG/ACT nasal spray SHAKE LIQUID AND USE 2 SPRAYS IN EACH NOSTRIL DAILY (Patient taking differently: Place 2 sprays into both nostrils daily as needed for allergies or rhinitis.)   furosemide (LASIX) 20 MG tablet Take 1 tablet (20 mg total) by mouth daily.   gabapentin (NEURONTIN) 300 MG capsule TAKE 2 CAPSULES (600 MG TOTAL) BY MOUTH 3 (THREE) TIMES DAILY. (Patient taking differently: Take 600 mg by mouth 3 (three) times daily.)   levocetirizine (XYZAL) 5 MG tablet Take 1 tablet (5 mg total) by mouth every evening. (  Patient not taking: Reported on 12/29/2020)   lidocaine in nystatin suspension Swish & spit with 5 ml's 3 times a day for 5 days then 3 times a day as needed (Patient not taking: No sig reported)   losartan (COZAAR) 25 MG tablet TAKE 1 TABLET (25 MG TOTAL) BY MOUTH DAILY. (Patient taking differently:  Take 25 mg by mouth daily.)   magic mouthwash w/lidocaine SOLN Swish and spit 61m PO TID x5 days, then PRN- mouth pain. Steroid, Benadryl, Nystatin, Lidocaine 1:1 ratio. (Patient not taking: No sig reported)   montelukast (SINGULAIR) 10 MG tablet Take 1 tablet (10 mg total) by mouth at bedtime.   Multiple Vitamins-Minerals (MULTIVITAMIN WITH MINERALS) tablet Take 1 tablet by mouth daily.   predniSONE (DELTASONE) 10 MG tablet Take 1 tablet (10 mg total) by mouth daily with breakfast.   temazepam (RESTORIL) 30 MG capsule Take 1 capsule (30 mg total) by mouth at bedtime as needed for sleep   [DISCONTINUED] potassium chloride SA (KLOR-CON) 20 MEQ tablet Take 1 tablet (20 mEq total) by mouth daily. (Patient not taking: No sig reported)   GEN CALL: From reviewing the patients chart the patient is currently in the hospital in ICU so I did not call him or his wife.  Care Gaps:None.   Star Rating Drugs: Losartan 25 mg 01/06/21 90 DS.   Follow-Up:Pharmacist Review  VCharlann Lange RWhite HallPharmacist Assistant [phone]

## 2021-01-07 NOTE — Progress Notes (Signed)
  Mobility Specialist Criteria Algorithm Info.  SATURATION QUALIFICATIONS: (This note is used to comply with regulatory documentation for home oxygen)  Patient Saturations on Room Air at Rest = 95%  Patient Saturations on Room Air while Ambulating = 90%  Patient Saturations on 0 Liters of oxygen while Ambulating = n/a%  Please briefly explain why patient needs home oxygen:  Mobility Team: [facility] elevated:Self regulated Activity: Ambulated in hall (in chair before and after ambulation) Range of motion: Active; All extremities Level of assistance: Standby assist, set-up cues, supervision of patient - no hands on Assistive device: Front wheel walker Minutes sitting in chair:  Minutes stood:  Minutes ambulated: 5 minutes Distance ambulated (ft): 240 ft Mobility response: Tolerated well Bed Position: Chair  Patient received in chair eager to participate in mobility. Ambulated in hallway at supervision with slow steady gait. Required cues for pursed breathing when oxygen desaturates. Completed education on energy conservation + pursed lip breathing. Did not require supplemental oxygen, saturating >90% on room air while ambulating. Sat in recliner chair upon returning to room. Tolerated ambulation well without complaint or incident and was left with all needs met.   01/07/2021 4:12 PM

## 2021-01-07 NOTE — Progress Notes (Signed)
PT refusing cpap.

## 2021-01-07 NOTE — Therapy (Signed)
Occupational Therapy Treatment Patient Details Name: Garrett Munoz MRN: [id number] DOB: January 29, 1946 Today's Date: 01/07/2021   History of present illness Pt is a 75 y.o. male admitted 12/29/20 with acute hypercarbic respiratory failure secondary to PNA. ETT 9/12-9/14. PMH includes COPD (not on home O2), afib, HF, OSA, gout, TKA.   OT comments  Pt seen for OT ADL retraining session this date with focus on functional mobility and transfers from bed, toilet, standing at sink for grooming. Pt is overall supervision A, min guard assist secondary to lines/equipment using RW in room. Pt limited by bouts of bowel and urinary urgency primarily during session, requiring supervision-Min assist . Pt O2 SATS on 2L prior to ADL's was 95%, on RA sitting at EOB 91-94% and in bathroom 88-92% on RA. Encouraged purse lip breathing. Added extender to O2 & can now reach into bathroom. O2 at 95% on 2L via nasal cannula at end of session.   Recommendations for follow up therapy are one component of a multi-disciplinary discharge planning process, led by the attending physician.  Recommendations may be updated based on patient status, additional functional criteria and insurance authorization.    Follow Up Recommendations  No OT follow up;Supervision - Intermittent    Equipment Recommendations  None recommended by OT    Recommendations for Other Services      Precautions / Restrictions Precautions Precautions: Fall;Other (comment) Precaution Comments: Watch SpO2 (does not wear O2 baseline)       Mobility Bed Mobility Overal bed mobility: Modified Independent Bed Mobility: Supine to Sit     Supine to sit: Modified independent (Device/Increase time);HOB elevated          Transfers Overall transfer level: Modified independent Equipment used: Rolling walker (2 wheeled) Transfers: Sit to/from Stand Sit to Stand: Supervision Stand pivot transfers: Supervision            Balance Overall balance  assessment: Needs assistance Sitting-balance support: No upper extremity supported;Feet supported Sitting balance-Leahy Scale: Good     Standing balance support: Bilateral upper extremity supported;No upper extremity supported;During functional activity Standing balance-Leahy Scale: Fair Standing balance comment: able to static stand and take steps without UE support     ADL either performed or assessed with clinical judgement   ADL Overall ADL's : Needs assistance/impaired     Grooming: Wash/dry hands;Wash/dry face;Supervision/safety;Standing Grooming Details (indicate cue type and reason): standing at sink   Toilet Transfer: Supervision/safety;Min guard;Ambulation;RW;Grab bars Toilet Transfer Details (indicate cue type and reason): Toilet in Advertising account executive and Hygiene: Minimal assistance;Sitting/lateral lean;Sit to/from stand       Functional mobility during ADLs: Supervision/safety;Rolling walker;Cueing for sequencing General ADL Comments: Pt limited by bouts of bowel and urinary urgency primarily during session, requiring supervision-Min assist . Pt O2 SATS on 2L prior to ADL's was 95%, on RA sitting at EOB 91-94% and in bathroom 88-92% on RA. Encouraged purse lip breathing. Added extender to O2 so he can use to get to bathroom. Pt requires assist with safety and lines.      Cognition Arousal/Alertness: Awake/alert Behavior During Therapy: WFL for tasks assessed/performed Overall Cognitive Status: Within Functional Limits for tasks assessed                                                General Comments Pt O2 SATS on 2L prior to ADL's was 95%,  on RA sitting at EOB 91-94% and in bathroom 88-92% on RA. Encouraged purse lip breathing. Added extender to O2 so he can use to get to bathroom. Pt requires assist with safety and lines.    Pertinent Vitals/ Pain       Pain Assessment: No/denies pain Faces Pain Scale: No  hurt   Frequency  Min 2X/week        Progress Toward Goals  OT Goals(current goals can now be found in the care plan section)  Progress towards OT goals: Progressing toward goals  Acute Rehab OT Goals Patient Stated Goal: Go home tomorrow  Plan Discharge plan remains appropriate       AM-PAC OT "6 Clicks" Daily Activity     Outcome Measure   Help from another person eating meals?: None Help from another person taking care of personal grooming?: None Help from another person toileting, which includes using toliet, bedpan, or urinal?: A Little Help from another person bathing (including washing, rinsing, drying)?: A Little Help from another person to put on and taking off regular upper body clothing?: None Help from another person to put on and taking off regular lower body clothing?: A Little 6 Click Score: 21    End of Session Equipment Utilized During Treatment: Oxygen;Rolling walker  OT Visit Diagnosis: Unsteadiness on feet (R26.81)   Activity Tolerance Patient tolerated treatment well   Patient Left in chair;with call bell/phone within reach;with family/visitor present   Nurse Communication Mobility status;Other (comment) (Bowel issues, family can assist to 3:1 PRN)        Time: 0927-0950 OT Time Calculation (min): 23 min  Charges: OT General Charges $OT Visit: 1 Visit OT Treatments $Self Care/Home Management : 23-37 mins   Meliyah Simon Beth Dixon, OTR/L 01/07/2021, 10:03 AM

## 2021-01-08 ENCOUNTER — Other Ambulatory Visit (HOSPITAL_COMMUNITY): Payer: Self-pay

## 2021-01-08 ENCOUNTER — Telehealth: Payer: Self-pay

## 2021-01-08 ENCOUNTER — Inpatient Hospital Stay (HOSPITAL_COMMUNITY): Payer: PPO

## 2021-01-08 DIAGNOSIS — J9602 Acute respiratory failure with hypercapnia: Secondary | ICD-10-CM | POA: Diagnosis not present

## 2021-01-08 DIAGNOSIS — J9601 Acute respiratory failure with hypoxia: Secondary | ICD-10-CM | POA: Diagnosis not present

## 2021-01-08 LAB — BASIC METABOLIC PANEL
Anion gap: 7 (ref 5–15)
BUN: 20 mg/dL (ref 8–23)
CO2: 43 mmol/L — ABNORMAL HIGH (ref 22–32)
Calcium: 9 mg/dL (ref 8.9–10.3)
Chloride: 88 mmol/L — ABNORMAL LOW (ref 98–111)
Creatinine, Ser: 0.94 mg/dL (ref 0.61–1.24)
GFR, Estimated: >60 mL/min (ref >60)
Glucose, Bld: 104 mg/dL — ABNORMAL HIGH (ref 70–99)
Potassium: 3.3 mmol/L — ABNORMAL LOW (ref 3.5–5.1)
Sodium: 138 mmol/L (ref 135–145)

## 2021-01-08 LAB — MAGNESIUM: Magnesium: 1.9 mg/dL (ref 1.7–2.4)

## 2021-01-08 MED ORDER — POTASSIUM CHLORIDE CRYS ER 20 MEQ PO TBCR
40.0000 meq | EXTENDED_RELEASE_TABLET | ORAL | Status: AC
  Administered 2021-01-08 (×2): 40 meq via ORAL
  Filled 2021-01-08 (×2): qty 2

## 2021-01-08 MED ORDER — POTASSIUM CHLORIDE CRYS ER 20 MEQ PO TBCR
40.0000 meq | EXTENDED_RELEASE_TABLET | Freq: Every day | ORAL | 0 refills | Status: DC
  Filled 2021-01-08: qty 14, 7d supply, fill #0

## 2021-01-08 MED ORDER — FUROSEMIDE 20 MG PO TABS
ORAL_TABLET | ORAL | 0 refills | Status: DC
  Filled 2021-01-08: qty 30, fill #0
  Filled 2021-02-02: qty 30, 8d supply, fill #0

## 2021-01-08 NOTE — Discharge Summary (Addendum)
Physician Discharge Summary  MYKING SAR SEG:[id number] DOB: 1945-07-04 DOA: 12/29/2020  PCP: [name], MD  Admit date: 12/29/2020 Discharge date: 01/08/2021 30 Day Unplanned Readmission Risk Score    Flowsheet Row ED to Hosp-Admission (Current) from 12/29/2020 in [facility]  30 Day Unplanned Readmission Risk Score (%) 16.66 Filed at 01/08/2021 0801       This score is the patient's risk of an unplanned readmission within 30 days of being discharged (0 -100%). The score is based on dignosis, age, lab data, medications, orders, and past utilization.   Low:  0-14.9   Medium: 15-21.9   High: 22-29.9   Extreme: 30 and above          Admitted From: Home Disposition: Home  Recommendations for Outpatient Follow-up:  Follow up with PCP in 1-2 weeks Please obtain BMP/CBC in one week Please follow up with your PCP on the following pending results: Unresulted Labs (From admission, onward)    None         Home Health: Yes Equipment/Devices: Home oxygen  Discharge Condition: Stable CODE STATUS: Full code Diet recommendation: Cardiac/low-sodium/dysphagia 3 diet  Subjective: Seen and examined.  No complaints.  No shortness of breath wife and sister at the bedside.  He is very adamant on going home today.  Brief/Interim Summary: 75 yo male with known COPD, pAFib, diastolic heart failure admitted with acute hypercarbic respiratory failure secondary to pneumonia.   Patient follows with Dr [name] for pulmonary outpatient for upper airway cough syndrome and dyspnea on exertion since Nov 2021. Patient presented to ED with complaints of shortness of breath.  Vital signs on presentation: Temp 100.6, HR 109, RR 22, SpO2 88% on 6 L [place].  He was increased to 10 L [place] with improvement in oxygenation.  Imaging with new right lower lobe consolidation. Patient was started on antibiotics with Azithromycin and Rocephin.  Patient was later found to be unresponsive and ABG  revealed hypercarbic respiratory failure with ABG: 7/119/91/34. He required intubation and mechanical ventilation. Antibiotics were broadened to Cefepime.   Acute hypoxic and hypercapnic respiratory failure secondary to aspiration pneumonia: Intubated 12/29/2020, extubated 12/31/2020.  Completed 5 days of antibiotics few days ago.   Acute on chronic diastolic congestive heart failure: Echo completed on 12/30/2020 shows ejection fraction of 55 to 50%, no different than previous echo.   Chest x-ray 01/03/2021 still showed pulmonary edema but improved compared to the x-ray 5 days ago.  Patient's Lasix was increased to 60 mg IV twice daily.  Repeat chest x-ray on 01/06/2021 showed no pulmonary edema but he did have some crackles on examination.  Patient's +3 pitting edema has improved significantly and currently only trace pitting edema.  Patient down to room air but saturation 90% at rest, however h his oxygen dropped to 86% with exertion on room air and required 2 L of oxygen.  It appears that patient had low-grade fever of 100.9 at around 5 AM this morning.  Interestingly, patient's primary RN is not aware of that and patient is not aware of that either.  We cannot verify whether this was real temperature or some air.  Discussed with patient and I recommended watching another night however patient has been very adamant on going home for last several days but has also been patient with my recommendations today he is impatient and wants to go home.  His wife was at the bedside also wants him to go home and she also states that  patient does have a history of having low-grade fever at home intermittently and that she will continue to monitor his temperature and if he will spike any fever, she will call PCP.  She also wants me to discharge him home.  Patient was taking only 20 mg Lasix at home.  At this point in time, I am discharging him on 40 mg p.o. twice daily for 7 days.  He is advised to see his PCP within 7 days and  reduce the dose to 40 mg p.o. daily.  I am also discharging him on 7 days of oral KCl 40 mg p.o. daily.   Permanent atrial fibrillation: Rates fairly controlled.  His Coreg was reduced to half dose.  However his heart rate has remained around 100-110.  This morning when he was walking, it went to 140 but now down to 110 when he is resting.  I will send him back on his home dose of Coreg and Eliquis.   Hypokalemia: We will replace before he goes home today.   Essential hypertension: Controlled, continue losartan.   Hypophosphatemia: Resolved.   Acute thrombocytopenia: Likely secondary to acute illness.  No signs of bleeding.  Platelets improving.   Prediabetes: Continue SSI.   Diarrhea/IBS: Improved.  Continue Imodium.  OSA: Nightly CPAP is recommended however patient has been refusing to wear CPAP here and even at home.  Counseling provided.   Generalized deconditioning/debility: Seen by PT OT.  Back at baseline.  PT recommended home health PT which is ordered for him.  Dysphagia: Patient did have some dysphagia after extubation.  He was on dysphagia 3 diet.  Patient had MBS today and SLP has cleared her for thin liquids thin liquids- with chin tuck posture.   Discharge Diagnoses:  Principal Problem:   Acute respiratory failure with hypoxia and hypercapnia (HCC) Active Problems:   Morbid obesity ([place])   Persistent atrial fibrillation (HCC)   Acute metabolic encephalopathy   CHF NYHA class III, acute on chronic, diastolic (HCC)   Polymyalgia rheumatica (HCC)   Sepsis due to pneumonia [facility])   Respiratory failure ([place])    Discharge Instructions   Allergies as of 01/08/2021   No Known Allergies      Medication List     STOP taking these medications    allopurinol 300 MG tablet Commonly known as: ZYLOPRIM   levocetirizine 5 MG tablet Commonly known as: XYZAL   lidocaine in nystatin suspension       TAKE these medications    albuterol 1.25 MG/3ML nebulizer  solution Commonly known as: ACCUNEB Take 1 ampule by nebulization every 4 (four) hours as needed for wheezing.   albuterol 108 (90 Base) MCG/ACT inhaler Commonly known as: VENTOLIN HFA Inhale 2 puffs into the lungs every 6 (six) hours as needed for wheezing or shortness of breath.   azelastine 0.1 % nasal spray Commonly known as: ASTELIN Place 1 spray into both nostrils daily. Use in each nostril as directed What changed:  when to take this reasons to take this additional instructions   carvedilol 12.5 MG tablet Commonly known as: COREG TAKE 1 & 1/2 TABLETS BY MOUTH EVERY MORNING AND 1 TABLET IN THE EVENING What changed:  how much to take how to take this when to take this additional instructions   Eliquis 5 MG Tabs tablet Generic drug: apixaban Take 1 tablet (5 mg total) by mouth 2 (two) times daily.   fluticasone 50 MCG/ACT nasal spray Commonly known as: FLONASE SHAKE LIQUID AND USE  2 SPRAYS IN EACH NOSTRIL DAILY What changed: See the new instructions.   furosemide 20 MG tablet Commonly known as: LASIX Take 40 mg p.o. twice daily for 7 days, then reduce to 40 mg p.o. daily or discussed with your PCP. What changed:  how much to take how to take this when to take this additional instructions   gabapentin 300 MG capsule Commonly known as: NEURONTIN TAKE 2 CAPSULES (600 MG TOTAL) BY MOUTH 3 (THREE) TIMES DAILY. What changed: how much to take   losartan 25 MG tablet Commonly known as: COZAAR TAKE 1 TABLET (25 MG TOTAL) BY MOUTH DAILY. What changed: how much to take   magic mouthwash w/lidocaine Soln Swish and spit 59m PO TID x5 days, then PRN- mouth pain. Steroid, Benadryl, Nystatin, Lidocaine 1:1 ratio.   montelukast 10 MG tablet Commonly known as: SINGULAIR Take 1 tablet (10 mg total) by mouth at bedtime.   multivitamin with minerals tablet Take 1 tablet by mouth daily.   potassium chloride SA 20 MEQ tablet Commonly known as: KLOR-CON Take 2 tablets (40  mEq total) by mouth daily for 7 days.   predniSONE 10 MG tablet Commonly known as: DELTASONE Take 1 tablet (10 mg total) by mouth daily with breakfast.   temazepam 30 MG capsule Commonly known as: RESTORIL Take 1 capsule (30 mg total) by mouth at bedtime as needed for sleep What changed:  when to take this reasons to take this               Durable Medical Equipment  (From admission, onward)           Start     Ordered   01/08/21 1005  For home use only DME oxygen  Once       Question Answer Comment  Length of Need Lifetime   Liters per Minute 2   Frequency Continuous (stationary and portable oxygen unit needed)   Oxygen delivery system Gas      01/08/21 1005            Follow-up Information     Care, [place] up.   Specialty: Home Health Services Why: Someone will call to schedule first home visit. Contact information: [phone]         PSusy Frizzle MD Follow up.   Specialty: Family Medicine Contact information: [phone]         KTroy Sine MD .   Specialty: Cardiology Contact information: [phone]         PSusy Frizzle MD Follow up in 1 week(s).   Specialty: Family Medicine Contact information: [phone]         KTroy Sine MD .   Specialty: Cardiology Contact information: [phone]               No Known Allergies  Consultations: PCCM   Procedures/Studies: DG Abd 1 View  Result Date: 12/29/2020 CLINICAL DATA:  Enteric tube placement EXAM: ABDOMEN - 1 VIEW COMPARISON:  CT abdomen/pelvis dated 08/13/2019 FINDINGS: Enteric tube terminates in the gastric cardia. Nonobstructive bowel gas pattern. IMPRESSION: Enteric tube  terminates in the gastric cardia. Electronically Signed   By: SJulian HyM.D.   On: 12/29/2020 19:09   DG  CHEST PORT 1 VIEW  Result Date: 01/06/2021 CLINICAL DATA:  Congestive heart failure. EXAM: PORTABLE CHEST 1 VIEW COMPARISON:  01/03/2021 FINDINGS: Cardiomegaly. Aortic atherosclerosis. Chronic bronchial thickening. No pulmonary edema, consolidation, collapse or effusion. IMPRESSION: Cardiomegaly and aortic atherosclerosis. Chronic bronchial thickening. No change or other active process. Electronically Signed   By: Nelson Chimes M.D.   On: 01/06/2021 11:09   DG CHEST PORT 1 VIEW  Result Date: 01/03/2021 CLINICAL DATA:  Shortness of breath, evaluate for pleural effusion or pneumothorax EXAM: PORTABLE CHEST 1 VIEW COMPARISON:  Chest radiograph 12/29/2020 FINDINGS: The patient has been extubated. The heart is mildly enlarged. The mediastinal contours are within normal limits. There are increased interstitial markings throughout both lungs. Previously seen focal appearing opacities in the right lung have improved. There is no pleural effusion. There is no pneumothorax. There is no acute osseous abnormality. IMPRESSION: 1. Previously seen focal opacity in the right lung have improved. 2. Cardiomegaly with suspected mild pulmonary interstitial edema. 3. No pleural effusion pneumothorax. Electronically Signed   By: Valetta Mole M.D.   On: 01/03/2021 11:49   DG CHEST PORT 1 VIEW  Result Date: 12/29/2020 CLINICAL DATA:  Central line placement EXAM: PORTABLE CHEST 1 VIEW COMPARISON:  12/29/2020 at 0855 hours FINDINGS: Endotracheal tube terminates 5 cm above the carina. Multifocal right lung opacities, suspicious for pneumonia. Possible small pleural effusions. No pneumothorax. Cardiomegaly. Right IJ venous catheter terminates in the mid SVC. Enteric tube terminates in the gastric cardia. IMPRESSION: Endotracheal tube terminates 5 cm above the carina. Right IJ venous catheter terminates in the mid SVC.  Enteric tube terminates in the gastric cardia. Multifocal right lung opacities, suspicious for pneumonia. Possible small pleural effusions. Electronically Signed   By: Julian Hy M.D.   On: 12/29/2020 19:08   DG Chest Portable 1 View  Result Date: 12/29/2020 CLINICAL DATA:  75 year old male with shortness of breath this morning. Former smoker. EXAM: PORTABLE CHEST 1 VIEW COMPARISON:  Chest radiographs 10/16/2020 and earlier. FINDINGS: Portable AP semi upright view at 0855 hours. Patient is slightly rotated to the right. There is Patchy and confluent opacity throughout the right mid and lower lung. The diaphragm remains visible. Stable cardiac size and mediastinal contours. Mild underlying cardiomegaly. No pneumothorax. Left lung appears stable. Paucity of bowel gas in the upper abdomen. No acute osseous abnormality identified. IMPRESSION: Patchy and confluent opacity in the right mid and lower lung is nonspecific but suggestive of multilobar pneumonia. If there are signs/symptoms of infection then post treatment PA and lateral chest X-ray is recommended in 3-4 weeks to ensure resolution and exclude underlying malignancy. If not recommend Chest CT (IV contrast preferred) to further characterize. Electronically Signed   By: Genevie Ann M.D.   On: 12/29/2020 09:09   ECHOCARDIOGRAM COMPLETE  Result Date: 12/30/2020    ECHOCARDIOGRAM REPORT   Patient Name:   ANCELMO HUNT Date of Exam: 12/30/2020 Medical Rec #:  [id number]       Height:       70.0 in Accession #:    [id number]      Weight:       329.8 lb Date of Birth:  23-Jan-1946      BSA:          2.582 m Patient Age:    62 years        BP:           104/71 mmHg Patient Gender: M  HR:           92 bpm. Exam Location:  Inpatient Procedure: 2D Echo, Cardiac Doppler, Color Doppler and Intracardiac            Opacification Agent Indications:    R06.02 SOB  History:        Patient has prior history of Echocardiogram examinations, most                  recent 03/10/2020. CHF, Abnormal ECG, Arrythmias:Atrial                 Fibrillation, Signs/Symptoms:Shortness of Breath and Dyspnea;                 Risk Factors:Hypertension and Former Smoker. Respiratory                 failure.  Sonographer:    Roseanna Rainbow RDCS Referring Phys: [id number] [facility] B HICKS  Sonographer Comments: Technically difficult study due to poor echo windows, patient is morbidly obese, echo performed with patient supine and on artificial respirator, suboptimal parasternal window and suboptimal subcostal window. Image acquisition challenging due to patient body habitus. IMPRESSIONS  1. Left ventricular ejection fraction, by estimation, is 55 to 60%. The left ventricle has normal function. The left ventricle has no regional wall motion abnormalities. Left ventricular diastolic parameters are indeterminate.  2. Right ventricular systolic function is mildly reduced. The right ventricular size is normal.  3. The mitral valve is normal in structure. Trivial mitral valve regurgitation. No evidence of mitral stenosis.  4. The aortic valve is tricuspid. There is mild calcification of the aortic valve. Aortic valve regurgitation is not visualized. No aortic stenosis is present.  5. The inferior vena cava is normal in size with greater than 50% respiratory variability, suggesting right atrial pressure of 3 mmHg. FINDINGS  Left Ventricle: Left ventricular ejection fraction, by estimation, is 55 to 60%. The left ventricle has normal function. The left ventricle has no regional wall motion abnormalities. Definity contrast agent was given IV to delineate the left ventricular  endocardial borders. The left ventricular internal cavity size was normal in size. There is no left ventricular hypertrophy. Left ventricular diastolic parameters are indeterminate. Right Ventricle: The right ventricular size is normal. No increase in right ventricular wall thickness. Right ventricular systolic function is mildly reduced.  Left Atrium: Left atrial size was normal in size. Right Atrium: Right atrial size was normal in size. Pericardium: There is no evidence of pericardial effusion. Mitral Valve: The mitral valve is normal in structure. Trivial mitral valve regurgitation. No evidence of mitral valve stenosis. Tricuspid Valve: The tricuspid valve is normal in structure. Tricuspid valve regurgitation is trivial. No evidence of tricuspid stenosis. Aortic Valve: The aortic valve is tricuspid. There is mild calcification of the aortic valve. Aortic valve regurgitation is not visualized. No aortic stenosis is present. Pulmonic Valve: The pulmonic valve was normal in structure. Pulmonic valve regurgitation is trivial. No evidence of pulmonic stenosis. Aorta: The aortic root is normal in size and structure. Venous: The inferior vena cava is normal in size with greater than 50% respiratory variability, suggesting right atrial pressure of 3 mmHg. IAS/Shunts: No atrial level shunt detected by color flow Doppler.  LEFT VENTRICLE PLAX 2D LVIDd:         4.50 cm LVIDs:         3.60 cm LV PW:         1.40 cm LV IVS:  1.30 cm LVOT diam:     2.10 cm LV SV:         46 LV SV Index:   18 LVOT Area:     3.46 cm  LV Volumes (MOD) LV vol d, MOD A2C: 60.8 ml LV vol d, MOD A4C: 99.7 ml LV vol s, MOD A2C: 32.8 ml LV vol s, MOD A4C: 43.9 ml LV SV MOD A2C:     28.0 ml LV SV MOD A4C:     99.7 ml LV SV MOD BP:      41.3 ml RIGHT VENTRICLE            IVC RV S prime:     5.40 cm/s  IVC diam: 1.95 cm TAPSE (M-mode): 1.5 cm LEFT ATRIUM           Index       RIGHT ATRIUM           Index LA diam:      2.50 cm 0.97 cm/m  RA Area:     16.20 cm LA Vol (A2C): 43.3 ml 16.77 ml/m RA Volume:   34.70 ml  13.44 ml/m LA Vol (A4C): 24.8 ml 9.61 ml/m  AORTIC VALVE             PULMONIC VALVE LVOT Vmax:   79.80 cm/s  PR End Diast Vel: 2.00 msec LVOT Vmean:  49.600 cm/s LVOT VTI:    0.132 m  AORTA Ao Root diam: 4.00 cm MITRAL VALVE MV Area (PHT): 4.21 cm    SHUNTS MV Decel  Time: 180 msec    Systemic VTI:  0.13 m MV E velocity: 82.70 cm/s  Systemic Diam: 2.10 cm Skeet Latch MD Electronically signed by Skeet Latch MD Signature Date/Time: 12/30/2020/5:08:35 PM    Final    DG ESOPHAGUS W DOUBLE CM (HD)  Result Date: 12/17/2020 CLINICAL DATA:  Chronic CHF, pharyngo esophageal dysphagia in a 75 year old male. EXAM: ESOPHOGRAM / BARIUM SWALLOW / BARIUM TABLET STUDY TECHNIQUE: Combined double contrast and single contrast examination performed using effervescent crystals, thick barium liquid, and thin barium liquid. The patient was observed with fluoroscopy swallowing a 13 mm barium sulphate tablet. FLUOROSCOPY TIME:  Fluoroscopy Time:  2 minutes 9 seconds Radiation Exposure Index (if provided by the fluoroscopic device): 41.6 mGy Number of Acquired Spot Images: 1 COMPARISON:  None FINDINGS: Single swallow assessments were first performed in the lateral projection showing no gross swallow dysfunction aside from mild penetration. Effervescent crystals and high-density barium then administered showing normal esophageal distensibility and caliber. Small hiatal hernia. Single swallow assessment and RAO position with intact primary wave but with proximal escape of portion of the bolus, proximally 30% of the ingested bolus into the proximal esophagus. Mild tertiary peristaltic activity. Moderate hiatal hernia with normal distensibility of the distal esophagus. The patient swallowed barium tablet without difficulty which passed readily through the esophagus. IMPRESSION: Mild dysmotility. Small hiatal hernia. Assessment mildly limited by patient body habitus. Electronically Signed   By: Zetta Bills M.D.   On: 12/17/2020 11:20     Discharge Exam: Vitals:   01/08/21 0929 01/08/21 0950  BP: (!) 123/97   Pulse: (!) 105   Resp: 20   Temp: 97.8 F (36.6 C) (!) 97.4 F (36.3 C)  SpO2: 94%    Vitals:   01/08/21 0439 01/08/21 0900 01/08/21 0929 01/08/21 0950  BP: 107/77 118/73  (!) 123/97   Pulse: 90 (!) 105 (!) 105   Resp: 20  20   Temp: [name])  100.9 F (38.3 C)  97.8 F (36.6 C) (!) 97.4 F (36.3 C)  TempSrc: Oral  Oral Oral  SpO2: 94% 92% 94%   Weight: 134.1 kg     Height:        General: Pt is alert, awake, not in acute distress Cardiovascular: RRR, S1/S2 +, no rubs, no gallops Respiratory: CTA bilaterally, no wheezing, no rhonchi Abdominal: Soft, NT, ND, bowel sounds + Extremities: Trace pitting edema bilateral lower extremity, no cyanosis    The results of significant diagnostics from this hospitalization (including imaging, microbiology, ancillary and laboratory) are listed below for reference.     Microbiology: Recent Results (from the past 240 hour(s))  Urine Culture     Status: None   Collection Time: 12/29/20 10:41 AM   Specimen: Urine, Clean Catch  Result Value Ref Range Status   Specimen Description URINE, CLEAN CATCH  Final   Special Requests Normal  Final   Culture   Final    NO GROWTH Performed at [facility], [address] [place], [place] [address]    Report Status 12/30/2020 FINAL  Final  Culture, Respiratory w Gram Stain     Status: None   Collection Time: 12/29/20  4:37 PM   Specimen: Bronchoalveolar Lavage; Respiratory  Result Value Ref Range Status   Specimen Description BRONCHIAL ALVEOLAR LAVAGE  Final   Special Requests NONE  Final   Gram Stain   Final    FEW SQUAMOUS EPITHELIAL CELLS PRESENT MODERATE WBC PRESENT,BOTH PMN AND MONONUCLEAR FEW GRAM POSITIVE COCCI    Culture   Final    FEW Normal respiratory flora-no Staph aureus or Pseudomonas seen Performed at [facility], [place] [address], [place], [place] [address]    Report Status 01/01/2021 FINAL  Final  MRSA Next Gen by PCR, Nasal     Status: None   Collection Time: 12/29/20  7:00 PM   Specimen: Nasal Mucosa; Nasal Swab  Result Value Ref Range Status   MRSA by PCR Next Gen NOT DETECTED NOT DETECTED Final    Comment: (NOTE) The GeneXpert MRSA  Assay (FDA approved for NASAL specimens only), is one component of a comprehensive MRSA colonization surveillance program. It is not intended to diagnose MRSA infection nor to guide or monitor treatment for MRSA infections. Test performance is not FDA approved in patients less than 34 years old. Performed at [facility], [place] [address] [place], [place] [address]      Labs: BNP (last 3 results) Recent Labs    03/09/20 1228 10/16/20 1143 12/29/20 0839  BNP 261.1* 131.0* 696.7*   Basic Metabolic Panel: Recent Labs  Lab 01/02/21 0331 01/03/21 0210 01/04/21 0201 01/05/21 0815 01/06/21 0351 01/07/21 0334 01/08/21 0424  NA 140 142  --  140 137 139 138  K 5.4* 4.0  --  3.8 2.9* 3.3* 3.3*  CL 98 94*  --  87* 85* 89* 88*  CO2 37* 37*  --  43* 40* 40* 43*  GLUCOSE 103* 118*  --  134* 103* 100* 104*  BUN 11 14  --  _0 CREATININE 0.87 0.88  --  0.86 0.76 0.78 0.94  CALCIUM 8.8* 9.4  --  9.1 8.7* 8.9 9.0  MG 1.9 1.9 1.7  --   --   --  1.9   Liver Function Tests: Recent Labs  Lab 01/05/21 0815  AST 33  ALT 29  ALKPHOS 51  BILITOT 1.8*  PROT 6.3*  ALBUMIN 3.1*   No results for input(s): LIPASE, AMYLASE in the last 168 hours. No results for input(s): AMMONIA in the last 168 hours. CBC: Recent Labs  Lab 01/02/21 0331 01/03/21 0210 01/05/21 0815 01/07/21 0334  WBC 7.5 10.1 10.3 8.7  HGB 14.9 15.5 16.1 16.1  HCT 47.3 48.0 51.0 48.5  MCV 100.6* 99.0 99.2 97.6  PLT 122* 128* 165 142*   Cardiac Enzymes: No results for input(s): CKTOTAL, CKMB, CKMBINDEX, TROPONINI in the last 168 hours. BNP: Invalid input(s): POCBNP CBG: Recent Labs  Lab 01/05/21 1057 01/05/21 1555 01/05/21 2101 01/06/21 0613 01/06/21 1059  GLUCAP 113* 109* 114* 107* 107*   D-Dimer No results for input(s): DDIMER in the last 72 hours. Hgb A1c No results for input(s): HGBA1C in the last 72 hours. Lipid Profile No results for input(s): CHOL, HDL, LDLCALC, TRIG, CHOLHDL,  LDLDIRECT in the last 72 hours. Thyroid function studies No results for input(s): TSH, T4TOTAL, T3FREE, THYROIDAB in the last 72 hours.  Invalid input(s): FREET3 Anemia work up No results for input(s): VITAMINB12, FOLATE, FERRITIN, TIBC, IRON, RETICCTPCT in the last 72 hours. Urinalysis    Component Value Date/Time   COLORURINE YELLOW 12/29/2020 0846   APPEARANCEUR CLEAR 12/29/2020 0846   LABSPEC 1.010 12/29/2020 0846   PHURINE 6.0 12/29/2020 0846   GLUCOSEU NEGATIVE 12/29/2020 0846   HGBUR NEGATIVE 12/29/2020 0846   BILIRUBINUR NEGATIVE 12/29/2020 0846   KETONESUR NEGATIVE 12/29/2020 0846   PROTEINUR NEGATIVE 12/29/2020 0846   UROBILINOGEN 0.2 02/02/2012 1120   NITRITE NEGATIVE 12/29/2020 0846   LEUKOCYTESUR TRACE (A) 12/29/2020 0846   Sepsis Labs Invalid input(s): PROCALCITONIN,  WBC,  LACTICIDVEN Microbiology Recent Results (from the past 240 hour(s))  Urine Culture     Status: None   Collection Time: 12/29/20 10:41 AM   Specimen: Urine, Clean Catch  Result Value Ref Range Status   Specimen Description URINE, CLEAN CATCH  Final   Special Requests Normal  Final   Culture   Final    NO GROWTH Performed at [facility], [place] [address] [place], [place] [address]    Report Status 12/30/2020 FINAL  Final  Culture, Respiratory w Gram Stain     Status: None   Collection Time: 12/29/20  4:37 PM   Specimen: Bronchoalveolar Lavage; Respiratory  Result Value Ref Range Status   Specimen Description BRONCHIAL ALVEOLAR LAVAGE  Final   Special Requests NONE  Final   Gram Stain   Final    FEW SQUAMOUS EPITHELIAL CELLS PRESENT MODERATE WBC PRESENT,BOTH PMN AND MONONUCLEAR FEW GRAM POSITIVE COCCI    Culture   Final    FEW Normal respiratory flora-no Staph aureus or Pseudomonas seen Performed at [facility], [address] [place], [place] [address]    Report Status 01/01/2021 FINAL  Final  MRSA Next Gen by PCR, Nasal     Status: None   Collection Time: 12/29/20   7:00 PM   Specimen: Nasal Mucosa; Nasal Swab  Result Value Ref Range Status   MRSA by PCR Next Gen NOT DETECTED NOT DETECTED Final    Comment: (NOTE) The GeneXpert MRSA Assay (FDA approved for NASAL specimens only), is one component of a comprehensive MRSA colonization surveillance program. It is not intended to diagnose MRSA infection nor to guide or monitor treatment for MRSA infections. Test performance is not FDA approved in patients less than 77 years old. Performed at [facility], [place] [address] [place], [place] [address]  Time coordinating discharge: Over 30 minutes  SIGNED:   Darliss Cheney, MD  [facility] 01/08/2021, 10:08 AM  If 7PM-7AM, please contact night-coverage [email or web address]

## 2021-01-08 NOTE — Progress Notes (Signed)
Physical Therapy Treatment Patient Details Name: Garrett Munoz MRN: [id number] DOB: 1945-10-17 Today's Date: 01/08/2021   History of Present Illness Pt is a 75 y.o. male admitted 12/29/20 with acute hypercarbic respiratory failure secondary to PNA. ETT 9/12-9/14. PMH includes COPD (not on home O2), afib, HF, OSA, gout, TKA.    PT Comments    Pt continues to display good progress towards his PT goals. Pt completed hallway ambulation with a RW and stairs with 1 handrail without LOB at supervision level. He continued to require supplemental O2 though to maintain sats >/= 94%. Will continue to follow acutely. Current recommendations remain appropriate.    Recommendations for follow up therapy are one component of a multi-disciplinary discharge planning process, led by the attending physician.  Recommendations may be updated based on patient status, additional functional criteria and insurance authorization.  Follow Up Recommendations  Home health PT;Supervision for mobility/OOB     Equipment Recommendations  None recommended by PT    Recommendations for Other Services       Precautions / Restrictions Precautions Precautions: Fall;Other (comment) Precaution Comments: Watch SpO2 (does not wear O2 baseline) Restrictions Weight Bearing Restrictions: No     Mobility  Bed Mobility               General bed mobility comments: Pt sitting up in recliner upon arrival.    Transfers Overall transfer level: Modified independent Equipment used: Rolling walker (2 wheeled) Transfers: Sit to/from Stand Sit to Stand: Modified independent (Device/Increase time)         General transfer comment: Able to come to stand without LOB, mod I.  Ambulation/Gait Ambulation/Gait assistance: Supervision Gait Distance (Feet): 280 Feet Assistive device: Rolling walker (2 wheeled) Gait Pattern/deviations: Step-through pattern;Trunk flexed;Decreased stride length Gait velocity: Decreased Gait  velocity interpretation: 1.31 - 2.62 ft/sec, indicative of limited community ambulator General Gait Details: Pt requesting use of RW. Slow, steady gait with RW and supervision for safety, no standing rest breaks required   Stairs Stairs: Yes Stairs assistance: Supervision Stair Management: One rail Right;Step to pattern;Forwards;One rail Left Number of Stairs: 3 General stair comments: Ascend/descend 3 steps with single UE support, R ascending and L descending to simulate home set-up. Step-to patttern, no LOB, supervision for safety.   Wheelchair Mobility    Modified Rankin (Stroke Patients Only)       Balance Overall balance assessment: Needs assistance Sitting-balance support: No upper extremity supported;Feet supported Sitting balance-Leahy Scale: Good     Standing balance support: Bilateral upper extremity supported;During functional activity;Single extremity supported Standing balance-Leahy Scale: Poor Standing balance comment: UE support for mobility                            Cognition Arousal/Alertness: Awake/alert Behavior During Therapy: WFL for tasks assessed/performed Overall Cognitive Status: Within Functional Limits for tasks assessed                                        Exercises General Exercises - Lower Extremity Mini-Sqauts: Strengthening;Both;Other reps (comment);Standing (x6 reps before fatigued)    General Comments General comments (skin integrity, edema, etc.): >/= 94% SpO2 on 2-4L O2      Pertinent Vitals/Pain Pain Assessment: Faces Faces Pain Scale: No hurt Pain Intervention(s): Monitored during session    Home Living  Prior Function            PT Goals (current goals can now be found in the care plan section) Acute Rehab PT Goals Patient Stated Goal: to go home today PT Goal Formulation: With patient/family Time For Goal Achievement: 01/15/21 Potential to Achieve Goals:  Good Progress towards PT goals: Progressing toward goals    Frequency    Min 3X/week      PT Plan Current plan remains appropriate    Co-evaluation              AM-PAC PT "6 Clicks" Mobility   Outcome Measure  Help needed turning from your back to your side while in a flat bed without using bedrails?: None Help needed moving from lying on your back to sitting on the side of a flat bed without using bedrails?: None Help needed moving to and from a bed to a chair (including a wheelchair)?: None Help needed standing up from a chair using your arms (e.g., wheelchair or bedside chair)?: None Help needed to walk in hospital room?: A Little Help needed climbing 3-5 steps with a railing? : A Little 6 Click Score: 22    End of Session Equipment Utilized During Treatment: Gait belt;Oxygen Activity Tolerance: Patient tolerated treatment well Patient left: in chair;with call bell/phone within reach;with family/visitor present Nurse Communication: Mobility status PT Visit Diagnosis: Other abnormalities of gait and mobility (R26.89);Muscle weakness (generalized) (M62.81);Unsteadiness on feet (R26.81)     Time: 7588-3254 PT Time Calculation (min) (ACUTE ONLY): 14 min  Charges:  $Gait Training: 8-22 mins                     Moishe Spice, PT, DPT Acute Rehabilitation Services  Pager: [phone] Office: [place] 01/08/2021, 1:42 PM

## 2021-01-08 NOTE — TOC Transition Note (Addendum)
Transition of Care [facility]) - CM/SW Discharge Note   Patient Details  Name: Garrett Munoz MRN: [id number] Date of Birth: 06/14/1945  Transition of [facility]) CM/SW Contact:  Zenon Mayo, RN Phone Number: 01/08/2021, 9:20 AM   Clinical Narrative:    Patient is for dc today, he is set up with [facility], [place] notified [facility] with [place].  Patient will need home oxygen as well, he states he would like to get oxygen from [place].  Will wait for order and ambulatory sats to fax over. NCM spoke with Theadora Rama at [facility], she will call this NCM back when she receives the fax.    Final next level of care: [place] Barriers to Discharge: No Barriers Identified   Patient Goals and CMS Choice Patient states their goals for this hospitalization and ongoing recovery are:: return home CMS Medicare.gov Compare Post Acute Care list provided to:: Patient Choice offered to / list presented to : Patient, Spouse  Discharge Placement                       Discharge Plan and Services   Discharge Planning Services: CM Consult Post Acute Care Choice: [place] Agency: [place] Date [facility] Agency Contacted: 01/01/21 Time [place]: 8638 Representative spoke with at [place]: [place] Interventions     Readmission Risk Interventions No flowsheet data found.

## 2021-01-08 NOTE — Care Management Important Message (Signed)
Important Message  Patient Details  Name: Garrett Munoz MRN: [id number] Date of Birth: 1945-07-28   Medicare Important Message Given:  Yes     Orbie Pyo 01/08/2021, 3:37 PM

## 2021-01-08 NOTE — Telephone Encounter (Signed)
Transition Care Management Follow-up Telephone Call Date of discharge and from where: 01/08/21. [place] Diagnosis: Acute Respiratory Failure How have you been since you were released from the hospital? Pt and wife both state that the patient is doing well.  Any questions or concerns? No  Items Reviewed: Did the pt receive and understand the discharge instructions provided? Yes  Medications obtained and verified? Yes  Other? Yes  Pt's wife is concerned about patient's follow up appointment with Dr. Claiborne Billings not being until Apr 24, 2021. Any new allergies since your discharge? No  Dietary orders reviewed? Yes Do you have support at home? No   Home Care and Equipment/Supplies: Were home health services ordered? yes If so, what is the name of the agency? [facility]  Has the agency set up a time to come to the patient's home? no Were any new equipment or medical supplies ordered?  Yes: O2 What is the name of the medical supply agency? [facility] Were you able to get the supplies/equipment? yes Do you have any questions related to the use of the equipment or supplies? No  Functional Questionnaire: (I = Independent and D = Dependent) ADLs: I  Bathing/Dressing- I  Meal Prep- I  Eating- I  Maintaining continence- I  Transferring/Ambulation- I  Managing Meds- I  Follow up appointments reviewed:  PCP Hospital f/u appt confirmed? Yes  Scheduled to see Dr. Dennard Schaumann on 01/12/21 @ [facility] f/u appt confirmed? Yes  Scheduled to see Dr. Shelva Majestic on 04/24/2021.. Are transportation arrangements needed? No  If their condition worsens, is the pt aware to call PCP or go to the Emergency Dept.? Yes Was the patient provided with contact information for the PCP's office or ED? Yes Was to pt encouraged to call back with questions or concerns? Yes

## 2021-01-08 NOTE — TOC Transition Note (Signed)
Transition of Care [facility]) - CM/SW Discharge Note   Patient Details  Name: Garrett Munoz MRN: [id number] Date of Birth: July 20, 1945  Transition of Care [facility]) CM/SW Contact:  Zenon Mayo, RN Phone Number: 01/08/2021, 10:35 AM   Clinical Narrative:    For Dc today, he is set up with [facility] for [place], [place] will supply the home oxygen. Awaiting for call back with ETA of oxygen.   Final next level of care: [place] Barriers to Discharge: No Barriers Identified   Patient Goals and CMS Choice Patient states their goals for this hospitalization and ongoing recovery are:: return home CMS Medicare.gov Compare Post Acute Care list provided to:: Patient Choice offered to / list presented to : Patient, Spouse  Discharge Placement                       Discharge Plan and Services   Discharge Planning Services: CM Consult Post Acute Care Choice: Home Health            DME Agency: [facility] Date DME Agency Contacted: 01/08/21 Time DME Agency Contacted: 1034 Representative spoke with at DME Agency: [place]: PT [place]: [place] Date [place]: 01/01/21 Time [place]: 5732 Representative spoke with at [place]: [place] ([place]) Interventions     Readmission Risk Interventions No flowsheet data found.

## 2021-01-08 NOTE — Progress Notes (Signed)
Speech Language Pathology Treatment: Dysphagia  Patient Details Name: Garrett Munoz MRN: [id number] DOB: Oct 25, 1945 Today's Date: 01/08/2021 Time: 1751-0258 SLP Time Calculation (min) (ACUTE ONLY): 14 min  Assessment / Plan / Recommendation Clinical Impression  SLP reviewed MBS flouro loops with pt as he is to dc home today and education with family indication.  Pt's wife and daughter present and SLP reviewed study with all of them using teach back for swallow precautions.  Provided handout of MBS study and several swallow precaution signs.  Encouraged pt to have follow up [facility] SlP for dysphagia management - considering focus on timing of swallow, adequacy of laryngeal elevation, cough and expectoration strength.  Pt with mod I to recall chin tuck posture and moderate cues to hock, cough strength.  Silent asp reviewed - but overt asp may result in pt coughing.  Importance of assuring pt not congested, spiking temperatures and having colored phlegm reviewed to faciliate determination of po tolerance.  WATER with meals using teach back for clinical reasoning.  No further questions from pt and family relayed.  Thanks so much!  HPI HPI: Pt is a 75 y.o. male presented to ED for complaints of shortness of breath. Chest xray (9/12) significant for "patchy and confluent opacity in the right mid and lower lung is nonspecific but suggestive of multilobar pneumonia".   Pt later found unresponsive and ABG revealed hypercarbic respiratory failure. Intubated 9/12- 9/14. Aspiration PNA suspected in setting of chronic dysphagia per MD note 9/14. Pt has been seen by ENT previously with recent Esophagram (12/17/20) revealing mild dysmotility, small hiatal hernia. Also noted was "no gross swallow dysfunction aside from mild penetration".  Wife and daughter reported frequent coughing after meals over an extended period. PMH: COPD, HTN, pre-diabetes, OSA, pAFib, diastolic heart failure, followed by pulmonary OP for upper airway  cough syndrome and dyspnea on exertion since Nov 2021.      SLP Plan  Continue with current plan of care      Recommendations for follow up therapy are one component of a multi-disciplinary discharge planning process, led by the attending physician.  Recommendations may be updated based on patient status, additional functional criteria and insurance authorization.    Recommendations  Diet recommendations: Dysphagia 3 (mechanical soft);Thin liquid Liquids provided via: Straw Medication Administration: Whole meds with puree Supervision: Patient able to self feed;Full supervision/cueing for compensatory strategies Compensations: Multiple dry swallows after each bite/sip;Follow solids with liquid;Slow rate;Small sips/bites;Chin tuck;Use straw to facilitate chin tuck Postural Changes and/or Swallow Maneuvers: Seated upright 90 degrees                Oral Care Recommendations: Oral care BID Follow up Recommendations: Home health SLP SLP Visit Diagnosis: Dysphagia, oropharyngeal phase (R13.12);Dysphagia, pharyngoesophageal phase (R13.14) Plan: Continue with current plan of care       GO               Garrett Lime, MS [place] Office [phone] Pager [phone]  Garrett Munoz  01/08/2021, 11:08 AM

## 2021-01-08 NOTE — Progress Notes (Signed)
Modified Barium Swallow Progress Note  Patient Details  Name: Garrett Munoz MRN: [id number] Date of Birth: 1946-03-04  Today's Date: 01/08/2021  Modified Barium Swallow completed.  Full report located under Chart Review in the Imaging Section.  Brief recommendations include the following:  Clinical Impression  View was difficult due to pt's broad shoulders.  Patient presents with ability to protect airway during swallow with chin down posture consuming thin liquids.  Mild oropharyngeal swallow deficits noted with decreased oral clearance allowing spillage of retention into pharynx.  Pharyngeal swallow marked by decreased adequacy and timing of swallow with liquids allowing laryngeal penetration during the swallow of nectar and thin and mild silent aspiration.  In addition, pt pyriform sinus retention appears to spill into airway post-swallow - as noted in larynx/trachea when flouro turned back on.  Recommend advance diet to dys3/thin - prefer water with meals.  Tight chin tuck posture with liquids with continued multiple swallows to clear oropharyngeal retention as much able. Follow up at SNF advised for dysphagia management.  Using teach back, informed pt to recommendations.   Swallow Evaluation Recommendations       SLP Diet Recommendations: Dysphagia 3 (Mech soft) solids;Thin liquid;Free water protocol after oral care   Liquid Administration via: Straw   Medication Administration: Whole meds with puree       Compensations: Multiple dry swallows after each bite/sip;Follow solids with liquid;Slow rate;Small sips/bites;Chin tuck;Use straw to facilitate chin tuck   Postural Changes: Remain semi-upright after after feeds/meals (Comment);Seated upright at 90 degrees   Oral Care Recommendations: Oral care BID     Kathleen Lime, MS [facility] SLP Acute Rehab Services Office [phone] Pager [phone]    Macario Golds 01/08/2021,10:31 AM

## 2021-01-08 NOTE — Progress Notes (Signed)
Mobility Specialist Progress Note:   01/08/21 0900  Therapy Vitals  Pulse Rate (!) 105  BP 118/73  Oxygen Therapy  SpO2 92 %  O2 Device Room Air  Mobility  Activity Ambulated in hall  Range of Motion/Exercises Active;All extremities  Level of Assistance Standby assist, set-up cues, supervision of patient - no hands on  Assistive Device Front wheel walker  Minutes Ambulated 8 minutes  Distance Ambulated (ft) 230 ft  Mobility Ambulated with assistance in hallway  Mobility Response Tolerated well  Mobility performed by Mobility specialist  Bed Position Chair  Transport method Ambulatory  $Mobility charge 1 Mobility   Pre- Mobility: BP 118/73; HR 105 During Mobility: HR 120-144 Post Mobility:  BP 120/96 (manual); HR 98  Pt. Received at EOB and willing to participate. Ambulated in hallway at supervision. Took one standing rest break d/t elevated HR 144 bpm + O2 desaturated to 86% on RA. Returned Pt to chair with all needs met and family in room.   SATURATION QUALIFICATIONS: (This note is used to comply with regulatory documentation for home oxygen)  Patient Saturations on Room Air at Rest = 92%  Patient Saturations on Room Air while Ambulating = 86%  Patient Saturations on 2L Liters of oxygen while Ambulating = 96%  Please briefly explain why patient needs home oxygen: Pt desaturated on room air and required 2L to maintain saturation of 96%  Systems developer Phone [phone]

## 2021-01-09 ENCOUNTER — Other Ambulatory Visit (HOSPITAL_BASED_OUTPATIENT_CLINIC_OR_DEPARTMENT_OTHER): Payer: Self-pay

## 2021-01-12 ENCOUNTER — Other Ambulatory Visit: Payer: Self-pay

## 2021-01-12 ENCOUNTER — Encounter: Payer: Self-pay | Admitting: Family Medicine

## 2021-01-12 ENCOUNTER — Ambulatory Visit (INDEPENDENT_AMBULATORY_CARE_PROVIDER_SITE_OTHER): Payer: PPO | Admitting: Family Medicine

## 2021-01-12 ENCOUNTER — Telehealth: Payer: Self-pay | Admitting: *Deleted

## 2021-01-12 VITALS — BP 134/80 | HR 82 | Temp 98.3°F | Resp 16 | Ht 71.0 in | Wt 312.0 lb

## 2021-01-12 DIAGNOSIS — I509 Heart failure, unspecified: Secondary | ICD-10-CM | POA: Diagnosis not present

## 2021-01-12 DIAGNOSIS — J441 Chronic obstructive pulmonary disease with (acute) exacerbation: Secondary | ICD-10-CM | POA: Diagnosis not present

## 2021-01-12 DIAGNOSIS — J9601 Acute respiratory failure with hypoxia: Secondary | ICD-10-CM | POA: Diagnosis not present

## 2021-01-12 DIAGNOSIS — E785 Hyperlipidemia, unspecified: Secondary | ICD-10-CM | POA: Diagnosis not present

## 2021-01-12 DIAGNOSIS — J9602 Acute respiratory failure with hypercapnia: Secondary | ICD-10-CM | POA: Diagnosis not present

## 2021-01-12 DIAGNOSIS — M109 Gout, unspecified: Secondary | ICD-10-CM | POA: Diagnosis not present

## 2021-01-12 DIAGNOSIS — I4819 Other persistent atrial fibrillation: Secondary | ICD-10-CM

## 2021-01-12 DIAGNOSIS — Z6841 Body Mass Index (BMI) 40.0 and over, adult: Secondary | ICD-10-CM | POA: Diagnosis not present

## 2021-01-12 DIAGNOSIS — I700 Atherosclerosis of aorta: Secondary | ICD-10-CM | POA: Diagnosis not present

## 2021-01-12 DIAGNOSIS — K580 Irritable bowel syndrome with diarrhea: Secondary | ICD-10-CM | POA: Diagnosis not present

## 2021-01-12 DIAGNOSIS — J9691 Respiratory failure, unspecified with hypoxia: Secondary | ICD-10-CM | POA: Diagnosis not present

## 2021-01-12 DIAGNOSIS — I110 Hypertensive heart disease with heart failure: Secondary | ICD-10-CM | POA: Diagnosis not present

## 2021-01-12 DIAGNOSIS — Z9981 Dependence on supplemental oxygen: Secondary | ICD-10-CM | POA: Diagnosis not present

## 2021-01-12 DIAGNOSIS — I5033 Acute on chronic diastolic (congestive) heart failure: Secondary | ICD-10-CM | POA: Diagnosis not present

## 2021-01-12 DIAGNOSIS — R7303 Prediabetes: Secondary | ICD-10-CM | POA: Diagnosis not present

## 2021-01-12 DIAGNOSIS — J189 Pneumonia, unspecified organism: Secondary | ICD-10-CM | POA: Diagnosis not present

## 2021-01-12 DIAGNOSIS — N200 Calculus of kidney: Secondary | ICD-10-CM | POA: Diagnosis not present

## 2021-01-12 DIAGNOSIS — I088 Other rheumatic multiple valve diseases: Secondary | ICD-10-CM | POA: Diagnosis not present

## 2021-01-12 DIAGNOSIS — D696 Thrombocytopenia, unspecified: Secondary | ICD-10-CM | POA: Diagnosis not present

## 2021-01-12 DIAGNOSIS — J690 Pneumonitis due to inhalation of food and vomit: Secondary | ICD-10-CM | POA: Diagnosis not present

## 2021-01-12 DIAGNOSIS — R1310 Dysphagia, unspecified: Secondary | ICD-10-CM | POA: Diagnosis not present

## 2021-01-12 DIAGNOSIS — Z79899 Other long term (current) drug therapy: Secondary | ICD-10-CM | POA: Diagnosis not present

## 2021-01-12 DIAGNOSIS — K449 Diaphragmatic hernia without obstruction or gangrene: Secondary | ICD-10-CM | POA: Diagnosis not present

## 2021-01-12 DIAGNOSIS — Z7901 Long term (current) use of anticoagulants: Secondary | ICD-10-CM | POA: Diagnosis not present

## 2021-01-12 DIAGNOSIS — M353 Polymyalgia rheumatica: Secondary | ICD-10-CM | POA: Diagnosis not present

## 2021-01-12 DIAGNOSIS — I4821 Permanent atrial fibrillation: Secondary | ICD-10-CM | POA: Diagnosis not present

## 2021-01-12 DIAGNOSIS — M5137 Other intervertebral disc degeneration, lumbosacral region: Secondary | ICD-10-CM | POA: Diagnosis not present

## 2021-01-12 DIAGNOSIS — G4733 Obstructive sleep apnea (adult) (pediatric): Secondary | ICD-10-CM | POA: Diagnosis not present

## 2021-01-12 NOTE — Telephone Encounter (Signed)
Received call from [place], [facility] PT [phone]~ telephone.   Requested order to extend [facility] PT services 2x weekly x1 week, 1x weekly x2 weeks for strengthening, balance and gait. VO given.   Also requested order for [facility] ST evaluation as patient did have some dysphagia in hospital. VO given.

## 2021-01-12 NOTE — Progress Notes (Signed)
Subjective:    Patient ID: Garrett Munoz, male    DOB: 1945-06-17, 75 y.o.   MRN: [id number]  Admit date: 12/29/2020 Discharge date: 01/08/2021 30 Day Unplanned Readmission Risk Score     Flowsheet Row ED to Hosp-Admission (Current) from 12/29/2020 in [facility]  30 Day Unplanned Readmission Risk Score (%) 16.66 Filed at 01/08/2021 0801           This score is the patient's risk of an unplanned readmission within 30 days of being discharged (0 -100%). The score is based on dignosis, age, lab data, medications, orders, and past utilization.   Low:  0-14.9   Medium: 15-21.9   High: 22-29.9   Extreme: 30 and above                 Admitted From: Home Disposition: Home   Recommendations for Outpatient Follow-up:  Follow up with PCP in 1-2 weeks Please obtain BMP/CBC in one week Please follow up with your PCP on the following pending results: Unresulted Labs (From admission, onward)      None             Home Health: Yes Equipment/Devices: Home oxygen   Discharge Condition: Stable CODE STATUS: Full code Diet recommendation: Cardiac/low-sodium/dysphagia 3 diet   Subjective: Seen and examined.  No complaints.  No shortness of breath wife and sister at the bedside.  He is very adamant on going home today.   Brief/Interim Summary: 75 yo male with known COPD, pAFib, diastolic heart failure admitted with acute hypercarbic respiratory failure secondary to pneumonia.   Patient follows with Dr [name] for pulmonary outpatient for upper airway cough syndrome and dyspnea on exertion since Nov 2021. Patient presented to ED with complaints of shortness of breath.  Vital signs on presentation: Temp 100.6, HR 109, RR 22, SpO2 88% on 6 L [place].  He was increased to 10 L  with improvement in oxygenation.  Imaging with new right lower lobe consolidation. Patient was started on antibiotics with Azithromycin and Rocephin.  Patient was later found to be unresponsive and ABG  revealed hypercarbic respiratory failure with ABG: 7/119/91/34. He required intubation and mechanical ventilation. Antibiotics were broadened to Cefepime.    Acute hypoxic and hypercapnic respiratory failure secondary to aspiration pneumonia: Intubated 12/29/2020, extubated 12/31/2020.  Completed 5 days of antibiotics few days ago.   Acute on chronic diastolic congestive heart failure: Echo completed on 12/30/2020 shows ejection fraction of 55 to 50%, no different than previous echo.   Chest x-ray 01/03/2021 still showed pulmonary edema but improved compared to the x-ray 5 days ago.  Patient's Lasix was increased to 60 mg IV twice daily.  Repeat chest x-ray on 01/06/2021 showed no pulmonary edema but he did have some crackles on examination.  Patient's +3 pitting edema has improved significantly and currently only trace pitting edema.  Patient down to room air but saturation 90% at rest, however h his oxygen dropped to 86% with exertion on room air and required 2 L of oxygen.  It appears that patient had low-grade fever of 100.9 at around 5 AM this morning.  Interestingly, patient's primary RN is not aware of that and patient is not aware of that either.  We cannot verify whether this was real temperature or some air.  Discussed with patient and I recommended watching another night however patient has been very adamant on going home for last several days but has also been patient with my recommendations  today he is impatient and wants to go home.  His wife was at the bedside also wants him to go home and she also states that patient does have a history of having low-grade fever at home intermittently and that she will continue to monitor his temperature and if he will spike any fever, she will call PCP.  She also wants me to discharge him home.  Patient was taking only 20 mg Lasix at home.  At this point in time, I am discharging him on 40 mg p.o. twice daily for 7 days.  He is advised to see his PCP within 7 days  and reduce the dose to 40 mg p.o. daily.  I am also discharging him on 7 days of oral KCl 40 mg p.o. daily.   Permanent atrial fibrillation: Rates fairly controlled.  His Coreg was reduced to half dose.  However his heart rate has remained around 100-110.  This morning when he was walking, it went to 140 but now down to 110 when he is resting.  I will send him back on his home dose of Coreg and Eliquis.   Hypokalemia: We will replace before he goes home today.   Essential hypertension: Controlled, continue losartan.   Hypophosphatemia: Resolved.   Acute thrombocytopenia: Likely secondary to acute illness.  No signs of bleeding.  Platelets improving.   Prediabetes: Continue SSI.   Diarrhea/IBS: Improved.  Continue Imodium.   OSA: Nightly CPAP is recommended however patient has been refusing to wear CPAP here and even at home.  Counseling provided.   Generalized deconditioning/debility: Seen by PT OT.  Back at baseline.  PT recommended home health PT which is ordered for him.   Dysphagia: Patient did have some dysphagia after extubation.  He was on dysphagia 3 diet.  Patient had MBS today and SLP has cleared her for thin liquids thin liquids- with chin tuck posture.    Discharge Diagnoses:  Principal Problem:   Acute respiratory failure with hypoxia and hypercapnia (HCC) Active Problems:   Morbid obesity ([place])   Persistent atrial fibrillation (HCC)   Acute metabolic encephalopathy   CHF NYHA class III, acute on chronic, diastolic (HCC)   Polymyalgia rheumatica (HCC)   Sepsis due to pneumonia [facility])   Respiratory failure ([place])      01/12/21 Patient is here today for follow-up.  He is 89 to 90% on room air.  He was discharged home from the hospital on 2 L however he states that he does not feel like he needs it.  He is only wearing it at home.  He still has congestion in his right lower lung.  I reviewed the chest x-rays that he had both on admission and on discharge that definitely  showed opacity in the right middle and right lower lung.  This had markedly improved by the time of discharge.  He states that since being discharged home from the hospital he has had no additional fevers.  He states that his cough and shortness of breath have improved.  He still has +1 pitting edema in both legs however that is also better.  He still taking Lasix 40 mg twice daily and will not be due to reduced to 40 mg once daily until Thursday.  He has no follow-up scheduled to see pulmonology per his report.  He denies any chest pain orthopnea or paroxysmal nocturnal dyspnea   Allergies as of 01/08/2021   No Known Allergies      Past Medical History:  Diagnosis Date   Atrial fibrillation (HCC)    Bronchitis    hx   Cataract    right eye   CHF NYHA class III, acute on chronic, diastolic (HCC)    COPD (chronic obstructive pulmonary disease) (HCC)    DDD (degenerative disc disease), lumbosacral    Dysrhythmia    irregular heatbeat   Eye worm    right eye   Gout    History of kidney stones    Hypertension    Left knee DJD    Morbid obesity with BMI of 40.0-44.9, adult (HCC)    Pneumonia    hx   Prediabetes    Radicular syndrome of left leg    S/P total knee replacement    right   Sleep apnea    can't use sleep study 10 yrs ago   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    Past Surgical History:  Procedure Laterality Date   CARDIOVERSION N/A 03/04/2015   Procedure: CARDIOVERSION;  Surgeon: [name], MD;  Location: [facility];  Service: Cardiovascular;  Laterality: N/A;   CHOLECYSTECTOMY     COLONOSCOPY N/A 01/01/2016   Procedure: COLONOSCOPY;  Surgeon: [name], MD;  [facility];  Service: Endoscopy;  Laterality: N/A;  1:45   COLONOSCOPY N/A 01/14/2017   Procedure: COLONOSCOPY;  Surgeon: [name], MD;  [facility];  Service: Endoscopy;  Laterality: N/A;  12:05   EYE EXAMINATION UNDER ANESTHESIA W/ RETINAL CRYOTHERAPY AND RETINAL LASER  07/2011    EYE SURGERY  02/2011   cat rt   KNEE ARTHROSCOPY     bilateral   LUMBAR LAMINECTOMY/DECOMPRESSION MICRODISCECTOMY N/A 08/18/2017   Procedure: Bilateral microlumbar decompression Lumbar four-Lumbar five;  Surgeon: [name], MD;  Location: [facility];  Service: Orthopedics;  Laterality: N/A;   POLYPECTOMY  01/01/2016   Procedure: POLYPECTOMY;  Surgeon: [name], MD;  [facility];  Service: Endoscopy;;  colon   RETINAL DETACHMENT SURGERY  03/2011   rt   TOTAL KNEE ARTHROPLASTY  2003   rt   TOTAL KNEE ARTHROPLASTY  02/07/2012   Procedure: TOTAL KNEE ARTHROPLASTY;  Surgeon: [name], MD;  Location: [facility];  Service: Orthopedics;  Laterality: Left;   Current Outpatient Medications on File Prior to Visit  Medication Sig Dispense Refill   albuterol (ACCUNEB) 1.25 MG/3ML nebulizer solution Take 1 ampule by nebulization every 4 (four) hours as needed for wheezing.     albuterol (VENTOLIN HFA) 108 (90 Base) MCG/ACT inhaler Inhale 2 puffs into the lungs every 6 (six) hours as needed for wheezing or shortness of breath. 8.5 g 0   apixaban (ELIQUIS) 5 MG TABS tablet Take 1 tablet (5 mg total) by mouth 2 (two) times daily. 180 tablet 1   azelastine (ASTELIN) 0.1 % nasal spray Place 1 spray into both nostrils daily. Use in each nostril as directed (Patient taking differently: Place 1 spray into both nostrils daily as needed for rhinitis.) 30 mL 12   carvedilol (COREG) 12.5 MG tablet TAKE 1 & 1/2 TABLETS BY MOUTH EVERY MORNING AND 1 TABLET IN THE EVENING (Patient taking differently: Take 6.25-12.5 mg by mouth See admin instructions. Take half tablet by mouth in the morning and then take 1 whole tablet by mouth in the evening per wife) 225 tablet 3   fluticasone (FLONASE) 50 MCG/ACT nasal spray SHAKE LIQUID AND USE 2 SPRAYS IN EACH NOSTRIL DAILY (Patient taking differently: Place 2 sprays into both nostrils  daily as needed for allergies or rhinitis.) 16 g 6   furosemide (LASIX) 20 MG  tablet Take 2 tablets (40 mg total) by mouth 2 (two) times daily for 7 days, THEN 2 tablets (40 mg total) daily or as discussed with your PCP 30 tablet 0   gabapentin (NEURONTIN) 300 MG capsule TAKE 2 CAPSULES (600 MG TOTAL) BY MOUTH 3 (THREE) TIMES DAILY. (Patient taking differently: Take 600 mg by mouth 3 (three) times daily.) 540 capsule 3   losartan (COZAAR) 25 MG tablet TAKE 1 TABLET (25 MG TOTAL) BY MOUTH DAILY. (Patient taking differently: Take 25 mg by mouth daily.) 90 tablet 3   magic mouthwash w/lidocaine SOLN Swish and spit 24mL PO TID x5 days, then PRN- mouth pain. Steroid, Benadryl, Nystatin, Lidocaine 1:1 ratio. 100 mL 0   potassium chloride SA (KLOR-CON) 20 MEQ tablet Take 2 tablets (40 mEq total) by mouth daily for 7 days. 14 tablet 0   predniSONE (DELTASONE) 10 MG tablet Take 1 tablet (10 mg total) by mouth daily with breakfast. 90 tablet 1   temazepam (RESTORIL) 30 MG capsule Take 1 capsule (30 mg total) by mouth at bedtime as needed for sleep 30 capsule 2   No current facility-administered medications on file prior to visit.   No Known Allergies Social History   Socioeconomic History   Marital status: Married    Spouse name: Not on file   Number of children: Not on file   Years of education: Not on file   Highest education level: Not on file  Occupational History   Not on file  Tobacco Use   Smoking status: Former    Packs/day: 2.00    Years: 15.00    Pack years: 30.00    Types: Cigarettes    Quit date: 02/02/1992    Years since quitting: 28.9   Smokeless tobacco: Never   Tobacco comments:    occ alcohol  Vaping Use   Vaping Use: Never used  Substance and Sexual Activity   Alcohol use: Yes    Alcohol/week: 0.0 standard drinks    Comment: occasional   Drug use: No   Sexual activity: Not on file  Other Topics Concern   Not on file  Social History Narrative   Not on file   Social Determinants of Health   Financial Resource Strain: Not on file  Food  Insecurity: Not on file  Transportation Needs: Not on file  Physical Activity: Not on file  Stress: Not on file  Social Connections: Not on file  Intimate Partner Violence: Not on file      Review of Systems  All other systems reviewed and are negative.     Objective:   Physical Exam Vitals reviewed.  Constitutional:      General: He is not in acute distress.    Appearance: He is well-developed. He is not diaphoretic.  HENT:     Right Ear: Tympanic membrane, ear canal and external ear normal.     Left Ear: Tympanic membrane, ear canal and external ear normal.     Mouth/Throat:     Pharynx: No oropharyngeal exudate or posterior oropharyngeal erythema.  Neck:     Thyroid: No thyromegaly.     Vascular: No JVD.     Trachea: No tracheal deviation.  Cardiovascular:     Rate and Rhythm: Normal rate. Rhythm irregularly irregular.     Heart sounds: Normal heart sounds.  Pulmonary:     Effort: Pulmonary effort is normal.  Breath sounds: Examination of the right-lower field reveals rales. Rales present. No wheezing or rhonchi.  Chest:     Chest wall: No tenderness.  Abdominal:     General: Bowel sounds are normal. There is no distension.     Palpations: Abdomen is soft. There is no mass.     Tenderness: There is no abdominal tenderness. There is no guarding or rebound.  Musculoskeletal:     Right shoulder: No tenderness. Normal range of motion.     Left shoulder: No tenderness. Normal range of motion.     Left wrist: No swelling, tenderness or bony tenderness. Normal range of motion.     Cervical back: Neck supple.     Lumbar back: Tenderness present. No deformity. Decreased range of motion.     Right hip: No tenderness or bony tenderness. Normal range of motion.     Left hip: No tenderness. Normal range of motion.     Right lower leg: No edema.     Left lower leg: No edema.  Lymphadenopathy:     Cervical: No cervical adenopathy.  Skin:    Coloration: Skin is not pale.      Findings: No erythema or rash.  Neurological:     Mental Status: He is alert and oriented to person, place, and time.     Cranial Nerves: No cranial nerve deficit.     Motor: No abnormal muscle tone.     Coordination: Coordination normal.     Deep Tendon Reflexes: Reflexes normal.  Psychiatric:        Behavior: Behavior normal.        Thought Content: Thought content normal.        Judgment: Judgment normal.          Assessment & Plan:  Pneumonia of right lower lobe due to infectious organism - Plan: CBC with Differential/Platelet, COMPLETE METABOLIC PANEL WITH GFR  Chronic congestive heart failure, unspecified heart failure type ([place])  Persistent atrial fibrillation (HCC)  Respiratory failure with hypoxia, unspecified chronicity ([place]) Patient had what appeared to be aspiration pneumonia in the right middle and right lower lung coupled with congestive heart failure.  The aspiration pneumonia has clinically improved after antibiotics.  He states that his breathing is much better.  He is following the strategies outlined by the speech therapist regarding chin tuck and swallowing techniques to prevent aspiration.  Only time will tell if that will help reduce his cough and risk of pneumonia.  If he continues to cough we may need to repeat the sleep study to determine if he is continuing to aspirate.  Patient still seems somewhat fluid overloaded so we will continue Lasix 40 twice a day through Thursday.  I will recheck the patient on Friday and at that time I will reduce him to 40 once a day.  Recheck kidney function and potassium today

## 2021-01-13 LAB — CBC WITH DIFFERENTIAL/PLATELET
Absolute Monocytes: 522 cells/uL (ref 200–950)
Basophils Absolute: 63 cells/uL (ref 0–200)
Basophils Relative: 0.7 %
Eosinophils Absolute: 180 cells/uL (ref 15–500)
Eosinophils Relative: 2 %
HCT: 46.7 % (ref 38.5–50.0)
Hemoglobin: 15.9 g/dL (ref 13.2–17.1)
Lymphs Abs: 1296 cells/uL (ref 850–3900)
MCH: 31.8 pg (ref 27.0–33.0)
MCHC: 34 g/dL (ref 32.0–36.0)
MCV: 93.4 fL (ref 80.0–100.0)
MPV: 12.2 fL (ref 7.5–12.5)
Monocytes Relative: 5.8 %
Neutro Abs: 6939 cells/uL (ref 1500–7800)
Neutrophils Relative %: 77.1 %
Platelets: 214 10*3/uL (ref 140–400)
RBC: 5 10*6/uL (ref 4.20–5.80)
RDW: 12.6 % (ref 11.0–15.0)
Total Lymphocyte: 14.4 %
WBC: 9 10*3/uL (ref 3.8–10.8)

## 2021-01-13 LAB — COMPLETE METABOLIC PANEL WITH GFR
AG Ratio: 1.4 (calc) (ref 1.0–2.5)
ALT: 22 U/L (ref 9–46)
AST: 20 U/L (ref 10–35)
Albumin: 3.7 g/dL (ref 3.6–5.1)
Alkaline phosphatase (APISO): 54 U/L (ref 35–144)
BUN: 19 mg/dL (ref 7–25)
CO2: 37 mmol/L — ABNORMAL HIGH (ref 20–32)
Calcium: 9.5 mg/dL (ref 8.6–10.3)
Chloride: 93 mmol/L — ABNORMAL LOW (ref 98–110)
Creat: 0.99 mg/dL (ref 0.70–1.28)
Globulin: 2.6 g/dL (calc) (ref 1.9–3.7)
Glucose, Bld: 109 mg/dL — ABNORMAL HIGH (ref 65–99)
Potassium: 4.5 mmol/L (ref 3.5–5.3)
Sodium: 140 mmol/L (ref 135–146)
Total Bilirubin: 0.4 mg/dL (ref 0.2–1.2)
Total Protein: 6.3 g/dL (ref 6.1–8.1)
eGFR: 79 mL/min/{1.73_m2} (ref >=60)

## 2021-01-16 ENCOUNTER — Other Ambulatory Visit: Payer: Self-pay

## 2021-01-16 ENCOUNTER — Ambulatory Visit (HOSPITAL_COMMUNITY)
Admission: RE | Admit: 2021-01-16 | Discharge: 2021-01-16 | Disposition: A | Discharge status: Home / Self Care | Payer: PPO | Source: Ambulatory Visit | Attending: Family Medicine | Admitting: Family Medicine

## 2021-01-16 ENCOUNTER — Ambulatory Visit (INDEPENDENT_AMBULATORY_CARE_PROVIDER_SITE_OTHER): Payer: PPO | Admitting: Family Medicine

## 2021-01-16 ENCOUNTER — Other Ambulatory Visit (HOSPITAL_COMMUNITY): Payer: Self-pay

## 2021-01-16 ENCOUNTER — Telehealth: Payer: Self-pay | Admitting: Family Medicine

## 2021-01-16 VITALS — BP 108/72 | HR 84 | Temp 97.3°F | Ht 71.0 in | Wt 313.6 lb

## 2021-01-16 DIAGNOSIS — J189 Pneumonia, unspecified organism: Secondary | ICD-10-CM | POA: Diagnosis not present

## 2021-01-16 DIAGNOSIS — I4819 Other persistent atrial fibrillation: Secondary | ICD-10-CM | POA: Diagnosis not present

## 2021-01-16 DIAGNOSIS — I509 Heart failure, unspecified: Secondary | ICD-10-CM

## 2021-01-16 DIAGNOSIS — R0989 Other specified symptoms and signs involving the circulatory and respiratory systems: Secondary | ICD-10-CM

## 2021-01-16 NOTE — Progress Notes (Signed)
Wt Readings from Last 3 Encounters:  01/16/21 (!) 313 lb 9.6 oz (142.2 kg)  01/12/21 (!) 312 lb (141.5 kg)  01/08/21 295 lb 10.2 oz (134.1 kg)     Subjective:    Patient ID: Garrett Munoz, male    DOB: 03/11/1946, 75 y.o.   MRN: [id number]  Admit date: 12/29/2020 Discharge date: 01/08/2021 30 Day Unplanned Readmission Risk Score     Flowsheet Row ED to Hosp-Admission (Current) from 12/29/2020 in [facility]  30 Day Unplanned Readmission Risk Score (%) 16.66 Filed at 01/08/2021 0801           This score is the patient's risk of an unplanned readmission within 30 days of being discharged (0 -100%). The score is based on dignosis, age, lab data, medications, orders, and past utilization.   Low:  0-14.9   Medium: 15-21.9   High: 22-29.9   Extreme: 30 and above                 Admitted From: Home Disposition: Home   Recommendations for Outpatient Follow-up:  Follow up with PCP in 1-2 weeks Please obtain BMP/CBC in one week Please follow up with your PCP on the following pending results: Unresulted Labs (From admission, onward)      None             Home Health: Yes Equipment/Devices: Home oxygen   Discharge Condition: Stable CODE STATUS: Full code Diet recommendation: Cardiac/low-sodium/dysphagia 3 diet   Subjective: Seen and examined.  No complaints.  No shortness of breath wife and sister at the bedside.  He is very adamant on going home today.   Brief/Interim Summary: 75 yo male with known COPD, pAFib, diastolic heart failure admitted with acute hypercarbic respiratory failure secondary to pneumonia.   Patient follows with Dr [name] for pulmonary outpatient for upper airway cough syndrome and dyspnea on exertion since Nov 2021. Patient presented to ED with complaints of shortness of breath.  Vital signs on presentation: Temp 100.6, HR 109, RR 22, SpO2 88% on 6 L [place].  He was increased to 10 L [place] with improvement in oxygenation.  Imaging with new right  lower lobe consolidation. Patient was started on antibiotics with Azithromycin and Rocephin.  Patient was later found to be unresponsive and ABG revealed hypercarbic respiratory failure with ABG: 7/119/91/34. He required intubation and mechanical ventilation. Antibiotics were broadened to Cefepime.    Acute hypoxic and hypercapnic respiratory failure secondary to aspiration pneumonia: Intubated 12/29/2020, extubated 12/31/2020.  Completed 5 days of antibiotics few days ago.   Acute on chronic diastolic congestive heart failure: Echo completed on 12/30/2020 shows ejection fraction of 55 to 50%, no different than previous echo.   Chest x-ray 01/03/2021 still showed pulmonary edema but improved compared to the x-ray 5 days ago.  Patient's Lasix was increased to 60 mg IV twice daily.  Repeat chest x-ray on 01/06/2021 showed no pulmonary edema but he did have some crackles on examination.  Patient's +3 pitting edema has improved significantly and currently only trace pitting edema.  Patient down to room air but saturation 90% at rest, however h his oxygen dropped to 86% with exertion on room air and required 2 L of oxygen.  It appears that patient had low-grade fever of 100.9 at around 5 AM this morning.  Interestingly, patient's primary RN is not aware of that and patient is not aware of that either.  We cannot verify whether this was real temperature or  some air.  Discussed with patient and I recommended watching another night however patient has been very adamant on going home for last several days but has also been patient with my recommendations today he is impatient and wants to go home.  His wife was at the bedside also wants him to go home and she also states that patient does have a history of having low-grade fever at home intermittently and that she will continue to monitor his temperature and if he will spike any fever, she will call PCP.  She also wants me to discharge him home.  Patient was taking only 20 mg  Lasix at home.  At this point in time, I am discharging him on 40 mg p.o. twice daily for 7 days.  He is advised to see his PCP within 7 days and reduce the dose to 40 mg p.o. daily.  I am also discharging him on 7 days of oral KCl 40 mg p.o. daily.   Permanent atrial fibrillation: Rates fairly controlled.  His Coreg was reduced to half dose.  However his heart rate has remained around 100-110.  This morning when he was walking, it went to 140 but now down to 110 when he is resting.  I will send him back on his home dose of Coreg and Eliquis.   Hypokalemia: We will replace before he goes home today.   Essential hypertension: Controlled, continue losartan.   Hypophosphatemia: Resolved.   Acute thrombocytopenia: Likely secondary to acute illness.  No signs of bleeding.  Platelets improving.   Prediabetes: Continue SSI.   Diarrhea/IBS: Improved.  Continue Imodium.   OSA: Nightly CPAP is recommended however patient has been refusing to wear CPAP here and even at home.  Counseling provided.   Generalized deconditioning/debility: Seen by PT OT.  Back at baseline.  PT recommended home health PT which is ordered for him.   Dysphagia: Patient did have some dysphagia after extubation.  He was on dysphagia 3 diet.  Patient had MBS today and SLP has cleared her for thin liquids thin liquids- with chin tuck posture.    Discharge Diagnoses:  Principal Problem:   Acute respiratory failure with hypoxia and hypercapnia (HCC) Active Problems:   Morbid obesity ([place])   Persistent atrial fibrillation (HCC)   Acute metabolic encephalopathy   CHF NYHA class III, acute on chronic, diastolic (HCC)   Polymyalgia rheumatica (HCC)   Sepsis due to pneumonia [facility])   Respiratory failure ([place])      01/12/21 Patient is here today for follow-up.  He is 89 to 90% on room air.  He was discharged home from the hospital on 2 L however he states that he does not feel like he needs it.  He is only wearing it at home.  He  still has congestion in his right lower lung.  I reviewed the chest x-rays that he had both on admission and on discharge that definitely showed opacity in the right middle and right lower lung.  This had markedly improved by the time of discharge.  He states that since being discharged home from the hospital he has had no additional fevers.  He states that his cough and shortness of breath have improved.  He still has +1 pitting edema in both legs however that is also better.  He still taking Lasix 40 mg twice daily and will not be due to reduced to 40 mg once daily until Thursday.  He has no follow-up scheduled to see pulmonology per  his report.  He denies any chest pain orthopnea or paroxysmal nocturnal dyspnea.  At that time, my plan was:  Patient had what appeared to be aspiration pneumonia in the right middle and right lower lung coupled with congestive heart failure.  The aspiration pneumonia has clinically improved after antibiotics.  He states that his breathing is much better.  He is following the strategies outlined by the speech therapist regarding chin tuck and swallowing techniques to prevent aspiration.  Only time will tell if that will help reduce his cough and risk of pneumonia.  If he continues to cough we may need to repeat the sleep study to determine if he is continuing to aspirate.  Patient still seems somewhat fluid overloaded so we will continue Lasix 40 twice a day through Thursday.  I will recheck the patient on Friday and at that time I will reduce him to 40 once a day.  Recheck kidney function and potassium today  01/16/21 Patient's weight is essentially unchanged from earlier this week.  He is 313 pounds.  However the swelling in his legs appears to be back to his baseline.  Today he only has trace bipedal edema up to his mid shin bilaterally.  His oxygen saturations are much better and are up to 97% on room air.  He is ambulating well and denies any chest pain or dyspnea.  However,  he does have more pronounced crackles in the right lower lobe than what I appreciated earlier this week.  He denies any episodes of aspiration or choking at home.  He denies any fevers or chills or pleurisy or shortness of breath   Allergies as of 01/08/2021   No Known Allergies      Past Medical History:  Diagnosis Date   Atrial fibrillation (HCC)    Bronchitis    hx   Cataract    right eye   CHF NYHA class III, acute on chronic, diastolic (HCC)    COPD (chronic obstructive pulmonary disease) (HCC)    DDD (degenerative disc disease), lumbosacral    Dysrhythmia    irregular heatbeat   Eye worm    right eye   Gout    History of kidney stones    Hypertension    Left knee DJD    Morbid obesity with BMI of 40.0-44.9, adult (HCC)    Pneumonia    hx   Prediabetes    Radicular syndrome of left leg    S/P total knee replacement    right   Sleep apnea    can't use sleep study 10 yrs ago   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    Past Surgical History:  Procedure Laterality Date   CARDIOVERSION N/A 03/04/2015   Procedure: CARDIOVERSION;  Surgeon: [name], MD;  Location: [facility];  Service: Cardiovascular;  Laterality: N/A;   CHOLECYSTECTOMY     COLONOSCOPY N/A 01/01/2016   Procedure: COLONOSCOPY;  Surgeon: [name], MD;  [facility];  Service: Endoscopy;  Laterality: N/A;  1:45   COLONOSCOPY N/A 01/14/2017   Procedure: COLONOSCOPY;  Surgeon: [name], MD;  [facility];  Service: Endoscopy;  Laterality: N/A;  12:05   EYE EXAMINATION UNDER ANESTHESIA W/ RETINAL CRYOTHERAPY AND RETINAL LASER  07/2011   EYE SURGERY  02/2011   cat rt   KNEE ARTHROSCOPY     bilateral   LUMBAR LAMINECTOMY/DECOMPRESSION MICRODISCECTOMY N/A 08/18/2017   Procedure: Bilateral microlumbar decompression Lumbar four-Lumbar five;  Surgeon: [name],  MD;  Location: [facility];  Service: Orthopedics;  Laterality: N/A;   POLYPECTOMY  01/01/2016   Procedure: POLYPECTOMY;   Surgeon: [name], MD;  [facility];  Service: Endoscopy;;  colon   RETINAL DETACHMENT SURGERY  03/2011   rt   TOTAL KNEE ARTHROPLASTY  2003   rt   TOTAL KNEE ARTHROPLASTY  02/07/2012   Procedure: TOTAL KNEE ARTHROPLASTY;  Surgeon: [name], MD;  Location: [facility];  Service: Orthopedics;  Laterality: Left;   Current Outpatient Medications on File Prior to Visit  Medication Sig Dispense Refill   apixaban (ELIQUIS) 5 MG TABS tablet Take 1 tablet (5 mg total) by mouth 2 (two) times daily. 180 tablet 1   carvedilol (COREG) 12.5 MG tablet TAKE 1 & 1/2 TABLETS BY MOUTH EVERY MORNING AND 1 TABLET IN THE EVENING (Patient taking differently: Take 6.25-12.5 mg by mouth See admin instructions. Take half tablet by mouth in the morning and then take 1 whole tablet by mouth in the evening per wife) 225 tablet 3   fluticasone (FLONASE) 50 MCG/ACT nasal spray SHAKE LIQUID AND USE 2 SPRAYS IN EACH NOSTRIL DAILY (Patient taking differently: Place 2 sprays into both nostrils daily as needed for allergies or rhinitis.) 16 g 6   furosemide (LASIX) 20 MG tablet Take 2 tablets (40 mg total) by mouth 2 (two) times daily for 7 days, THEN 2 tablets (40 mg total) daily or as discussed with your PCP 30 tablet 0   gabapentin (NEURONTIN) 300 MG capsule TAKE 2 CAPSULES (600 MG TOTAL) BY MOUTH 3 (THREE) TIMES DAILY. (Patient taking differently: Take 600 mg by mouth 3 (three) times daily.) 540 capsule 3   losartan (COZAAR) 25 MG tablet TAKE 1 TABLET (25 MG TOTAL) BY MOUTH DAILY. (Patient taking differently: Take 25 mg by mouth daily.) 90 tablet 3   magic mouthwash w/lidocaine SOLN Swish and spit 3mL PO TID x5 days, then PRN- mouth pain. Steroid, Benadryl, Nystatin, Lidocaine 1:1 ratio. 100 mL 0   predniSONE (DELTASONE) 10 MG tablet Take 1 tablet (10 mg total) by mouth daily with breakfast. 90 tablet 1   temazepam (RESTORIL) 30 MG capsule Take 1 capsule (30 mg total) by mouth at bedtime as needed for sleep  30 capsule 2   albuterol (ACCUNEB) 1.25 MG/3ML nebulizer solution Take 1 ampule by nebulization every 4 (four) hours as needed for wheezing. (Patient not taking: Reported on 01/16/2021)     albuterol (VENTOLIN HFA) 108 (90 Base) MCG/ACT inhaler Inhale 2 puffs into the lungs every 6 (six) hours as needed for wheezing or shortness of breath. (Patient not taking: Reported on 01/16/2021) 8.5 g 0   azelastine (ASTELIN) 0.1 % nasal spray Place 1 spray into both nostrils daily. Use in each nostril as directed (Patient not taking: Reported on 01/16/2021) 30 mL 12   potassium chloride SA (KLOR-CON) 20 MEQ tablet Take 2 tablets (40 mEq total) by mouth daily for 7 days. 14 tablet 0   No current facility-administered medications on file prior to visit.   No Known Allergies Social History   Socioeconomic History   Marital status: Married    Spouse name: Not on file   Number of children: Not on file   Years of education: Not on file   Highest education level: Not on file  Occupational History   Not on file  Tobacco Use   Smoking status: Former    Packs/day: 2.00    Years: 15.00    Pack  years: 30.00    Types: Cigarettes    Quit date: 02/02/1992    Years since quitting: 28.9   Smokeless tobacco: Never   Tobacco comments:    occ alcohol  Vaping Use   Vaping Use: Never used  Substance and Sexual Activity   Alcohol use: Yes    Alcohol/week: 0.0 standard drinks    Comment: occasional   Drug use: No   Sexual activity: Not on file  Other Topics Concern   Not on file  Social History Narrative   Not on file   Social Determinants of Health   Financial Resource Strain: Not on file  Food Insecurity: Not on file  Transportation Needs: Not on file  Physical Activity: Not on file  Stress: Not on file  Social Connections: Not on file  Intimate Partner Violence: Not on file      Review of Systems  All other systems reviewed and are negative.     Objective:   Physical Exam Vitals reviewed.   Constitutional:      General: He is not in acute distress.    Appearance: He is well-developed. He is not diaphoretic.  HENT:     Right Ear: Tympanic membrane, ear canal and external ear normal.     Left Ear: Tympanic membrane, ear canal and external ear normal.     Mouth/Throat:     Pharynx: No oropharyngeal exudate or posterior oropharyngeal erythema.  Neck:     Thyroid: No thyromegaly.     Vascular: No JVD.     Trachea: No tracheal deviation.  Cardiovascular:     Rate and Rhythm: Normal rate. Rhythm irregularly irregular.     Heart sounds: Normal heart sounds.  Pulmonary:     Effort: Pulmonary effort is normal.     Breath sounds: Examination of the right-lower field reveals rales. Rales present. No wheezing or rhonchi.  Chest:     Chest wall: No tenderness.  Abdominal:     General: Bowel sounds are normal. There is no distension.     Palpations: Abdomen is soft. There is no mass.     Tenderness: There is no abdominal tenderness. There is no guarding or rebound.  Musculoskeletal:     Cervical back: Neck supple.     Right hip: No tenderness or bony tenderness. Normal range of motion.     Left hip: No tenderness. Normal range of motion.     Right lower leg: No edema.     Left lower leg: No edema.  Lymphadenopathy:     Cervical: No cervical adenopathy.  Skin:    Coloration: Skin is not pale.     Findings: No erythema or rash.  Neurological:     Mental Status: He is alert and oriented to person, place, and time.     Cranial Nerves: No cranial nerve deficit.     Motor: No abnormal muscle tone.     Coordination: Coordination normal.     Deep Tendon Reflexes: Reflexes normal.  Psychiatric:        Behavior: Behavior normal.        Thought Content: Thought content normal.        Judgment: Judgment normal.          Assessment & Plan:  Chest congestion - Plan: DG Chest 2 View  Pneumonia of right lower lobe due to infectious organism  Chronic congestive heart failure,  unspecified heart failure type ([place])  Persistent atrial fibrillation ([place])  I am reassured by his pulse  oximetry.  I am also reassured by the improving edema.  He can definitely decrease his Lasix to 40 mg once a daily.  This will be his new maintenance dose.  I will repeat a chest x-ray today to ensure that there is no evidence of an increasing opacity in the right lower lobe to suggest a recurrent aspiration event/aspiration pneumonia.  If the chest x-ray looks worse than his discharge x-ray, I would likely place the patient on Augmentin.

## 2021-01-16 NOTE — Telephone Encounter (Signed)
FYI---Ben therapist eft vm:-- Myrtle Barnhard 1945/11/01 wanting to discontinue therapy all therapy--pt was rude to him--pt will be d/c per  patients request

## 2021-01-28 ENCOUNTER — Other Ambulatory Visit: Payer: Self-pay

## 2021-01-28 ENCOUNTER — Ambulatory Visit (INDEPENDENT_AMBULATORY_CARE_PROVIDER_SITE_OTHER): Payer: PPO | Admitting: *Deleted

## 2021-01-28 DIAGNOSIS — Z23 Encounter for immunization: Secondary | ICD-10-CM

## 2021-02-03 ENCOUNTER — Other Ambulatory Visit (HOSPITAL_COMMUNITY): Payer: Self-pay

## 2021-02-06 DIAGNOSIS — I110 Hypertensive heart disease with heart failure: Secondary | ICD-10-CM | POA: Diagnosis not present

## 2021-02-06 DIAGNOSIS — J690 Pneumonitis due to inhalation of food and vomit: Secondary | ICD-10-CM | POA: Diagnosis not present

## 2021-02-06 DIAGNOSIS — J9602 Acute respiratory failure with hypercapnia: Secondary | ICD-10-CM | POA: Diagnosis not present

## 2021-02-06 DIAGNOSIS — I4821 Permanent atrial fibrillation: Secondary | ICD-10-CM | POA: Diagnosis not present

## 2021-02-06 DIAGNOSIS — J9601 Acute respiratory failure with hypoxia: Secondary | ICD-10-CM | POA: Diagnosis not present

## 2021-02-06 DIAGNOSIS — I5033 Acute on chronic diastolic (congestive) heart failure: Secondary | ICD-10-CM | POA: Diagnosis not present

## 2021-02-06 DIAGNOSIS — J441 Chronic obstructive pulmonary disease with (acute) exacerbation: Secondary | ICD-10-CM | POA: Diagnosis not present

## 2021-02-07 DIAGNOSIS — J449 Chronic obstructive pulmonary disease, unspecified: Secondary | ICD-10-CM | POA: Diagnosis not present

## 2021-02-25 ENCOUNTER — Telehealth: Payer: Self-pay | Admitting: *Deleted

## 2021-02-25 NOTE — Telephone Encounter (Signed)
Received call from patient wife.   Reports that patient has been taking Lasix 40mg  PO QD. States that he is not having any notable edema. Patient voices c/o increased thirst and dry mouth. Inquired if Lasix could be decreased.   Please advise.

## 2021-02-26 ENCOUNTER — Other Ambulatory Visit (HOSPITAL_COMMUNITY): Payer: Self-pay

## 2021-02-26 MED ORDER — FUROSEMIDE 20 MG PO TABS
20.0000 mg | ORAL_TABLET | Freq: Every day | ORAL | 0 refills | Status: DC
  Filled 2021-02-26: qty 30, 30d supply, fill #0

## 2021-02-26 NOTE — Telephone Encounter (Signed)
Call placed to patient and patient wife made aware.   Prescription sent to pharmacy.

## 2021-03-03 ENCOUNTER — Ambulatory Visit: Payer: PPO | Admitting: Internal Medicine

## 2021-03-05 ENCOUNTER — Other Ambulatory Visit (HOSPITAL_COMMUNITY): Payer: Self-pay

## 2021-03-05 NOTE — Addendum Note (Signed)
Addended by: Sheral Flow on: 03/05/2021 12:48 PM   Modules accepted: Orders

## 2021-03-06 ENCOUNTER — Ambulatory Visit: Payer: PPO | Admitting: Allergy & Immunology

## 2021-03-19 ENCOUNTER — Telehealth: Payer: Self-pay

## 2021-03-19 ENCOUNTER — Other Ambulatory Visit (HOSPITAL_COMMUNITY): Payer: Self-pay

## 2021-03-19 NOTE — Telephone Encounter (Signed)
Pt's wife called to report pt has developed nasal congestion and cough for the past few days. She denies f/n/v/d or other symptoms. She tried to schedule OV but your schedule is full until next week. She states pt has doubled his prednisone to 20mg  daily and this has not helped.  Please advise, thanks!

## 2021-03-19 NOTE — Telephone Encounter (Signed)
Spoke with pt's wife and she is aware.   Front desk will add pt to tomorrow's schedule.

## 2021-03-20 ENCOUNTER — Other Ambulatory Visit: Payer: Self-pay

## 2021-03-20 ENCOUNTER — Other Ambulatory Visit (HOSPITAL_COMMUNITY): Payer: Self-pay

## 2021-03-20 ENCOUNTER — Encounter: Payer: Self-pay | Admitting: Family Medicine

## 2021-03-20 ENCOUNTER — Ambulatory Visit (INDEPENDENT_AMBULATORY_CARE_PROVIDER_SITE_OTHER): Payer: PPO | Admitting: Family Medicine

## 2021-03-20 VITALS — BP 122/88 | HR 56 | Ht 71.0 in | Wt 312.8 lb

## 2021-03-20 DIAGNOSIS — I509 Heart failure, unspecified: Secondary | ICD-10-CM

## 2021-03-20 DIAGNOSIS — J441 Chronic obstructive pulmonary disease with (acute) exacerbation: Secondary | ICD-10-CM

## 2021-03-20 MED ORDER — AZITHROMYCIN 250 MG PO TABS
ORAL_TABLET | ORAL | 0 refills | Status: DC
  Filled 2021-03-20: qty 6, 5d supply, fill #0

## 2021-03-20 MED ORDER — PREDNISONE 20 MG PO TABS
40.0000 mg | ORAL_TABLET | Freq: Every day | ORAL | 0 refills | Status: DC
  Filled 2021-03-20: qty 14, 7d supply, fill #0

## 2021-03-20 NOTE — Progress Notes (Signed)
Wt Readings from Last 3 Encounters:  03/20/21 (!) 312 lb 12.8 oz (141.9 kg)  01/16/21 (!) 313 lb 9.6 oz (142.2 kg)  01/12/21 (!) 312 lb (141.5 kg)     Subjective:    Patient ID: Garrett Munoz, male    DOB: 06/30/1945, 75 y.o.   MRN: [id number]  Patient was admitted to the hospital with right lower lobe pneumonia in September.  Over Thanksgiving, his grandchildren came to his home.  They were sick with an upper respiratory infection.  Shortly thereafter he developed similar symptoms.  The patient has been sick since Sunday.  He reports head congestion rhinorrhea and cough.  He started developing worsening shortness of breath over the week prompting him to call my office.  He is primarily concerned about his runny nose however he was also having a cough productive of purulent sputum.  He was afraid that he was developing pneumonia again.  Yesterday he increased his basal prednisone from 10 mg to 20 mg and today he is feeling some better.  On examination today he has right basilar crackles appreciated that are not present on the left side.  He has diffuse expiratory wheezing and diminished breath sounds bilaterally.  He also has +1 pitting edema in his extremities although his weight is essentially unchanged from September.   Allergies as of 01/08/2021   No Known Allergies      Past Medical History:  Diagnosis Date   Atrial fibrillation (HCC)    Bronchitis    hx   Cataract    right eye   CHF NYHA class III, acute on chronic, diastolic (HCC)    COPD (chronic obstructive pulmonary disease) (HCC)    DDD (degenerative disc disease), lumbosacral    Dysrhythmia    irregular heatbeat   Eye worm    right eye   Gout    History of kidney stones    Hypertension    Left knee DJD    Morbid obesity with BMI of 40.0-44.9, adult (HCC)    Pneumonia    hx   Prediabetes    Radicular syndrome of left leg    S/P total knee replacement    right   Sleep apnea    can't use sleep study 10 yrs ago    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    Past Surgical History:  Procedure Laterality Date   CARDIOVERSION N/A 03/04/2015   Procedure: CARDIOVERSION;  Surgeon: [name], MD;  Location: [facility];  Service: Cardiovascular;  Laterality: N/A;   CHOLECYSTECTOMY     COLONOSCOPY N/A 01/01/2016   Procedure: COLONOSCOPY;  Surgeon: [name], MD;  [facility];  Service: Endoscopy;  Laterality: N/A;  1:45   COLONOSCOPY N/A 01/14/2017   Procedure: COLONOSCOPY;  Surgeon: [name], MD;  [facility];  Service: Endoscopy;  Laterality: N/A;  12:05   EYE EXAMINATION UNDER ANESTHESIA W/ RETINAL CRYOTHERAPY AND RETINAL LASER  07/2011   EYE SURGERY  02/2011   cat rt   KNEE ARTHROSCOPY     bilateral   LUMBAR LAMINECTOMY/DECOMPRESSION MICRODISCECTOMY N/A 08/18/2017   Procedure: Bilateral microlumbar decompression Lumbar four-Lumbar five;  Surgeon: [name], MD;  Location: [facility];  Service: Orthopedics;  Laterality: N/A;   POLYPECTOMY  01/01/2016   Procedure: POLYPECTOMY;  Surgeon: [name], MD;  [facility];  Service: Endoscopy;;  colon   RETINAL DETACHMENT SURGERY  03/2011   rt   TOTAL KNEE ARTHROPLASTY  2003  rt   TOTAL KNEE ARTHROPLASTY  02/07/2012   Procedure: TOTAL KNEE ARTHROPLASTY;  Surgeon: [name], MD;  Location: [facility];  Service: Orthopedics;  Laterality: Left;   Current Outpatient Medications on File Prior to Visit  Medication Sig Dispense Refill   albuterol (ACCUNEB) 1.25 MG/3ML nebulizer solution Take 1 ampule by nebulization every 4 (four) hours as needed for wheezing.     albuterol (VENTOLIN HFA) 108 (90 Base) MCG/ACT inhaler Inhale 2 puffs into the lungs every 6 (six) hours as needed for wheezing or shortness of breath. 8.5 g 0   apixaban (ELIQUIS) 5 MG TABS tablet Take 1 tablet (5 mg total) by mouth 2 (two) times daily. 180 tablet 1   azelastine (ASTELIN) 0.1 % nasal spray Place 1 spray into both nostrils daily. Use in each  nostril as directed 30 mL 12   carvedilol (COREG) 12.5 MG tablet TAKE 1 & 1/2 TABLETS BY MOUTH EVERY MORNING AND 1 TABLET IN THE EVENING (Patient taking differently: Take 6.25-12.5 mg by mouth See admin instructions. Take half tablet by mouth in the morning and then take 1 whole tablet by mouth in the evening per wife) 225 tablet 3   fluticasone (FLONASE) 50 MCG/ACT nasal spray SHAKE LIQUID AND USE 2 SPRAYS IN EACH NOSTRIL DAILY (Patient taking differently: Place 2 sprays into both nostrils daily as needed for allergies or rhinitis.) 16 g 6   furosemide (LASIX) 20 MG tablet Take 1 tablet (20 mg total) by mouth daily. 30 tablet 0   gabapentin (NEURONTIN) 300 MG capsule TAKE 2 CAPSULES (600 MG TOTAL) BY MOUTH 3 (THREE) TIMES DAILY. (Patient taking differently: Take 600 mg by mouth 3 (three) times daily.) 540 capsule 3   losartan (COZAAR) 25 MG tablet TAKE 1 TABLET (25 MG TOTAL) BY MOUTH DAILY. (Patient taking differently: Take 25 mg by mouth daily.) 90 tablet 3   magic mouthwash w/lidocaine SOLN Swish and spit 52mL PO TID x5 days, then PRN- mouth pain. Steroid, Benadryl, Nystatin, Lidocaine 1:1 ratio. 100 mL 0   predniSONE (DELTASONE) 10 MG tablet Take 1 tablet (10 mg total) by mouth daily with breakfast. 90 tablet 1   temazepam (RESTORIL) 30 MG capsule Take 1 capsule (30 mg total) by mouth at bedtime as needed for sleep 30 capsule 2   potassium chloride SA (KLOR-CON) 20 MEQ tablet Take 2 tablets (40 mEq total) by mouth daily for 7 days. 14 tablet 0   No current facility-administered medications on file prior to visit.   No Known Allergies Social History   Socioeconomic History   Marital status: Married    Spouse name: Not on file   Number of children: Not on file   Years of education: Not on file   Highest education level: Not on file  Occupational History   Not on file  Tobacco Use   Smoking status: Former    Packs/day: 2.00    Years: 15.00    Pack years: 30.00    Types: Cigarettes     Quit date: 02/02/1992    Years since quitting: 29.1   Smokeless tobacco: Never   Tobacco comments:    occ alcohol  Vaping Use   Vaping Use: Never used  Substance and Sexual Activity   Alcohol use: Yes    Alcohol/week: 0.0 standard drinks    Comment: occasional   Drug use: No   Sexual activity: Not on file  Other Topics Concern   Not on file  Social History Narrative  Not on file   Social Determinants of Health   Financial Resource Strain: Not on file  Food Insecurity: Not on file  Transportation Needs: Not on file  Physical Activity: Not on file  Stress: Not on file  Social Connections: Not on file  Intimate Partner Violence: Not on file      Review of Systems  All other systems reviewed and are negative.     Objective:   Physical Exam Vitals reviewed.  Constitutional:      General: He is not in acute distress.    Appearance: He is well-developed. He is not diaphoretic.  HENT:     Right Ear: Tympanic membrane, ear canal and external ear normal.     Left Ear: Tympanic membrane, ear canal and external ear normal.     Mouth/Throat:     Pharynx: No oropharyngeal exudate or posterior oropharyngeal erythema.  Neck:     Thyroid: No thyromegaly.     Vascular: No JVD.     Trachea: No tracheal deviation.  Cardiovascular:     Rate and Rhythm: Normal rate. Rhythm irregularly irregular.     Heart sounds: Normal heart sounds.  Pulmonary:     Effort: Pulmonary effort is normal.     Breath sounds: Decreased air movement present. Examination of the right-upper field reveals wheezing. Examination of the left-upper field reveals wheezing. Examination of the right-lower field reveals wheezing and rales. Examination of the left-lower field reveals wheezing. Decreased breath sounds, wheezing, rhonchi and rales present.    Chest:     Chest wall: No tenderness.  Abdominal:     General: Bowel sounds are normal. There is no distension.     Palpations: Abdomen is soft. There is no  mass.     Tenderness: There is no abdominal tenderness. There is no guarding or rebound.  Musculoskeletal:     Cervical back: Neck supple.     Right hip: No tenderness or bony tenderness. Normal range of motion.     Left hip: No tenderness. Normal range of motion.     Right lower leg: No edema.     Left lower leg: No edema.  Lymphadenopathy:     Cervical: No cervical adenopathy.  Skin:    Coloration: Skin is not pale.     Findings: No erythema or rash.  Neurological:     Mental Status: He is alert and oriented to person, place, and time.     Cranial Nerves: No cranial nerve deficit.     Motor: No abnormal muscle tone.     Coordination: Coordination normal.     Deep Tendon Reflexes: Reflexes normal.  Psychiatric:        Behavior: Behavior normal.        Thought Content: Thought content normal.        Judgment: Judgment normal.          Assessment & Plan:  COPD exacerbation (HCC)  Chronic congestive heart failure, unspecified heart failure type ([place]) I believe the patient likely acquired a viral upper respiratory infection but is now developed a COPD exacerbation.  Begin prednisone 40 mg a day for 7 days.  Afterward he can resume his chronic dose of 10 mg a day.  Increase frequency of albuterol to 2-3 times a day as needed for cough and wheezing.  Add Z-Pak due to purulent sputum.  Given the pitting edema in his legs, I will also increase his Lasix for 3 days to 40 mg a day and then reassess  next week.

## 2021-03-26 ENCOUNTER — Ambulatory Visit: Payer: PPO | Admitting: Family Medicine

## 2021-03-31 ENCOUNTER — Telehealth: Payer: Self-pay | Admitting: Pharmacist

## 2021-03-31 NOTE — Progress Notes (Signed)
Chronic Care Management Pharmacy Assistant   Name: Garrett Munoz  MRN: [id number] DOB: 10-Oct-1945   Reason for Encounter: Disease State - General Adherence Call    Recent office visits:  03/20/21 Garrett Luo, MD (PCP) - Family Medicine - COPD - azithromycin (ZITHROMAX) 250 MG tablet and predniSONE (DELTASONE) 10 MG tablet Take 1 tablet (10 mg total) by mouth daily with breakfast and predniSONE (DELTASONE) 20 MG tablet Take 2 tablets (40 mg total) by mouth daily with breakfast prescribed. Increase frequency of albuterol to 2-3 times a day as needed for cough and wheezing. Increase his Lasix for 3 days to 40 mg a day and then reassess next week. Follow up as needed.   02/06/21 Garrett Luo, MD (PCP) - Family Medicine - Hypertensive heart disease - No notes available.   01/16/21 Garrett Luo, MD (PCP) - Family Medicine - Chest congestion - Chest XR ordered - decrease his Lasix to 40 mg once a daily.  This will be his new maintenance dose.  I will repeat a chest x-ray today to ensure that there is no evidence of an increasing opacity in the right lower lobe to suggest a recurrent aspiration event/aspiration pneumonia.  If the chest x-ray looks worse than his discharge x-ray, I would likely place the patient on Augmentin.  01/12/21 Garrett Luo, MD (PCP) - Family Medicine - Pneumonia of right lunch (hospital follow up) - labs were ordered. continue Lasix 40 twice a day through Thursday.  I will recheck the patient on Friday and at that time I will reduce him to 40 once a day.  Recheck kidney function and potassium today   Recent consult visits:  None noted.   Hospital visits: 12/29/20  Medication Reconciliation was completed by comparing discharge summary, patient's EMR and Pharmacy list, and upon discussion with patient.  Admitted to the hospital on 12/29/20 due to Community Acquired Pneumonia. Discharge date was 01/08/21. Discharged from [facility]?Medications  Started at [facility] Discharge:?? potassium chloride SA (KLOR-CON) 20 MEQ tablet  Medication Changes at Hospital Discharge: Changed furosemide (LASIX) 20 MG tablet  Medications Discontinued at Hospital Discharge: Stopped Allopurinol, Levocetirizine, Lidocaine in nystatin   Medications that remain the same after Hospital Discharge:??  All other medications will remain the same.    Medications: Outpatient Encounter Medications as of 03/31/2021  Medication Sig   albuterol (ACCUNEB) 1.25 MG/3ML nebulizer solution Take 1 ampule by nebulization every 4 (four) hours as needed for wheezing.   albuterol (VENTOLIN HFA) 108 (90 Base) MCG/ACT inhaler Inhale 2 puffs into the lungs every 6 (six) hours as needed for wheezing or shortness of breath.   apixaban (ELIQUIS) 5 MG TABS tablet Take 1 tablet (5 mg total) by mouth 2 (two) times daily.   azelastine (ASTELIN) 0.1 % nasal spray Place 1 spray into both nostrils daily. Use in each nostril as directed   azithromycin (ZITHROMAX) 250 MG tablet Take 2 tablets by mouth on day 1, then 1 tablet daily for 4 more days.   carvedilol (COREG) 12.5 MG tablet TAKE 1 & 1/2 TABLETS BY MOUTH EVERY MORNING AND 1 TABLET IN THE EVENING (Patient taking differently: Take 6.25-12.5 mg by mouth See admin instructions. Take half tablet by mouth in the morning and then take 1 whole tablet by mouth in the evening per wife)   fluticasone (FLONASE) 50 MCG/ACT nasal spray SHAKE LIQUID AND USE 2 SPRAYS IN EACH NOSTRIL DAILY (Patient taking differently: Place 2 sprays into both nostrils  daily as needed for allergies or rhinitis.)   furosemide (LASIX) 20 MG tablet Take 1 tablet (20 mg total) by mouth daily.   gabapentin (NEURONTIN) 300 MG capsule TAKE 2 CAPSULES (600 MG TOTAL) BY MOUTH 3 (THREE) TIMES DAILY. (Patient taking differently: Take 600 mg by mouth 3 (three) times daily.)   losartan (COZAAR) 25 MG tablet TAKE 1 TABLET (25 MG TOTAL) BY MOUTH DAILY. (Patient taking differently:  Take 25 mg by mouth daily.)   magic mouthwash w/lidocaine SOLN Swish and spit 80mL PO TID x5 days, then PRN- mouth pain. Steroid, Benadryl, Nystatin, Lidocaine 1:1 ratio.   potassium chloride SA (KLOR-CON) 20 MEQ tablet Take 2 tablets (40 mEq total) by mouth daily for 7 days.   predniSONE (DELTASONE) 10 MG tablet Take 1 tablet (10 mg total) by mouth daily with breakfast.   predniSONE (DELTASONE) 20 MG tablet Take 2 tablets (40 mg total) by mouth daily with breakfast.   temazepam (RESTORIL) 30 MG capsule Take 1 capsule (30 mg total) by mouth at bedtime as needed for sleep   No facility-administered encounter medications on file as of 03/31/2021.    Have you had any problems recently with your health? Patient reported he feels like he is having issues swallowing his food. He has coughs and has to drink water after eating. He reported his PCP is aware of this and he has been on several antibiotics and steroids for the cough. His cough is dry. Patient had a swallow study a month ago with no findings.   Have you had any problems with your pharmacy? Patient denied any problems with is current pharmacy.   What issues or side effects are you having with your medications? Patient denied any issues or side effects with his current medications.   What would you like me to pass along to Leata Mouse, CPP for them to help you with?  Patient did not have anything additional to add at this time. He is just frustrated with this lingering dry cough.   What can we do to take care of you better? Patient did not have any recommendations. Just would like to get his cough under control.   Care Gaps  AWV: done 10/08/20 Colonoscopy: done 01/14/17 DM Eye Exam: N/A DM Foot Exam:  N/A Microalbumin: N/A HbgAIC: done 12/30/20 (5.8) DEXA: N/A Mammogram: N/A   Star Rating Drugs: Losartan 25 mg - last filled 01/06/21 90 days   Future Appointments  Date Time Provider [place]  04/24/2021  2:40 PM [name], MD [facility] [place], [place] Pharmacist Assistant  [phone]

## 2021-04-01 ENCOUNTER — Other Ambulatory Visit (HOSPITAL_COMMUNITY): Payer: Self-pay

## 2021-04-01 ENCOUNTER — Other Ambulatory Visit: Payer: Self-pay

## 2021-04-01 DIAGNOSIS — M353 Polymyalgia rheumatica: Secondary | ICD-10-CM

## 2021-04-01 MED ORDER — PREDNISONE 10 MG PO TABS
10.0000 mg | ORAL_TABLET | Freq: Every day | ORAL | 3 refills | Status: AC
Start: 2021-04-01 — End: ?
  Filled 2021-04-01: qty 90, 90d supply, fill #0
  Filled 2021-06-29: qty 90, 90d supply, fill #1
  Filled 2021-09-28: qty 90, 90d supply, fill #2

## 2021-04-01 MED ORDER — FUROSEMIDE 20 MG PO TABS
20.0000 mg | ORAL_TABLET | Freq: Every day | ORAL | 3 refills | Status: DC
  Filled 2021-04-01: qty 90, 90d supply, fill #0

## 2021-04-02 ENCOUNTER — Other Ambulatory Visit: Payer: Self-pay | Admitting: Cardiovascular Disease

## 2021-04-02 ENCOUNTER — Other Ambulatory Visit: Payer: Self-pay | Admitting: Family Medicine

## 2021-04-02 ENCOUNTER — Other Ambulatory Visit (HOSPITAL_COMMUNITY): Payer: Self-pay

## 2021-04-02 MED ORDER — TEMAZEPAM 30 MG PO CAPS
30.0000 mg | ORAL_CAPSULE | Freq: Every evening | ORAL | 2 refills | Status: DC | PRN
  Filled 2021-04-02: qty 30, 30d supply, fill #0
  Filled 2021-05-01: qty 30, 30d supply, fill #1
  Filled 2021-06-03: qty 30, 30d supply, fill #2

## 2021-04-02 MED ORDER — CARVEDILOL 12.5 MG PO TABS
12.5000 mg | ORAL_TABLET | Freq: Two times a day (BID) | ORAL | 3 refills | Status: AC
  Filled 2021-04-02: qty 135, 54d supply, fill #0
  Filled 2021-06-29: qty 135, 54d supply, fill #1
  Filled 2021-09-28: qty 135, 54d supply, fill #2

## 2021-04-02 MED FILL — Gabapentin Cap 300 MG: ORAL | 60 days supply | Qty: 360 | Fill #3 | Status: AC

## 2021-04-02 MED FILL — Losartan Potassium Tab 25 MG: ORAL | 60 days supply | Qty: 60 | Fill #3 | Status: AC

## 2021-04-06 ENCOUNTER — Other Ambulatory Visit (HOSPITAL_COMMUNITY): Payer: Self-pay

## 2021-04-17 ENCOUNTER — Encounter: Payer: Self-pay | Admitting: Family Medicine

## 2021-04-17 ENCOUNTER — Ambulatory Visit (HOSPITAL_COMMUNITY)
Admission: RE | Admit: 2021-04-17 | Discharge: 2021-04-17 | Disposition: A | Discharge status: Home / Self Care | Payer: PPO | Source: Ambulatory Visit | Attending: Family Medicine | Admitting: Family Medicine

## 2021-04-17 ENCOUNTER — Ambulatory Visit (INDEPENDENT_AMBULATORY_CARE_PROVIDER_SITE_OTHER): Payer: PPO | Admitting: Family Medicine

## 2021-04-17 ENCOUNTER — Other Ambulatory Visit: Payer: Self-pay | Admitting: Family Medicine

## 2021-04-17 ENCOUNTER — Other Ambulatory Visit: Payer: Self-pay

## 2021-04-17 VITALS — BP 128/78 | HR 73 | Ht 71.0 in | Wt 310.0 lb

## 2021-04-17 DIAGNOSIS — J449 Chronic obstructive pulmonary disease, unspecified: Secondary | ICD-10-CM

## 2021-04-17 DIAGNOSIS — I517 Cardiomegaly: Secondary | ICD-10-CM | POA: Diagnosis not present

## 2021-04-17 MED ORDER — AMOXICILLIN-POT CLAVULANATE 875-125 MG PO TABS: 1.0000 | ORAL_TABLET | Freq: Two times a day (BID) | ORAL | 0 refills | Status: DC

## 2021-04-17 NOTE — Progress Notes (Signed)
Wt Readings from Last 3 Encounters:  04/17/21 (!) 310 lb (140.6 kg)  03/20/21 (!) 312 lb 12.8 oz (141.9 kg)  01/16/21 (!) 313 lb 9.6 oz (142.2 kg)     Subjective:    Patient ID: Garrett Munoz, male    DOB: 1945/06/18, 75 y.o.   MRN: [id number] 03/20/21 Patient was admitted to the hospital with right lower lobe pneumonia in September.  Over Thanksgiving, his grandchildren came to his home.  They were sick with an upper respiratory infection.  Shortly thereafter he developed similar symptoms.  The patient has been sick since Sunday.  He reports head congestion rhinorrhea and cough.  He started developing worsening shortness of breath over the week prompting him to call my office.  He is primarily concerned about his runny nose however he was also having a cough productive of purulent sputum.  He was afraid that he was developing pneumonia again.  Yesterday he increased his basal prednisone from 10 mg to 20 mg and today he is feeling some better.  On examination today he has right basilar crackles appreciated that are not present on the left side.  He has diffuse expiratory wheezing and diminished breath sounds bilaterally.  He also has +1 pitting edema in his extremities although his weight is essentially unchanged from September.  At that time, my plan was: I believe the patient likely acquired a viral upper respiratory infection but is now developed a COPD exacerbation.  Begin prednisone 40 mg a day for 7 days.  Afterward he can resume his chronic dose of 10 mg a day.  Increase frequency of albuterol to 2-3 times a day as needed for cough and wheezing.  Add Z-Pak due to purulent sputum.  Given the pitting edema in his legs, I will also increase his Lasix for 3 days to 40 mg a day and then reassess next week.  04/17/21 Patient states that his cough never got better.  He states that he has a productive cough every morning.  He coughs up "modes of mucus".  He frequently gets coughing fits and wheezing  and shortness of breath.  He has to use the albuterol several times a day to help with breathing.  He is concerned that he may have pneumonia.  He denies any fevers or chills.  He denies any chest pain.  He denies any purulent sputum.  Today on examination, he has diminished breath sounds throughout all.  He has faint right-sided expiratory wheezing.  Is difficult to auscultate breath sounds on his left side.  But there are no crackles or rails.  His weight is stable and he does not appear fluid overloaded on exam.      Past Medical History:  Diagnosis Date   Atrial fibrillation (HCC)    Bronchitis    hx   Cataract    right eye   CHF NYHA class III, acute on chronic, diastolic (HCC)    COPD (chronic obstructive pulmonary disease) (HCC)    DDD (degenerative disc disease), lumbosacral    Dysrhythmia    irregular heatbeat   Eye worm    right eye   Gout    History of kidney stones    Hypertension    Left knee DJD    Morbid obesity with BMI of 40.0-44.9, adult (HCC)    Pneumonia    hx   Prediabetes    Radicular syndrome of left leg    S/P total knee replacement    right   Sleep  apnea    can't use sleep study 10 yrs ago   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    Past Surgical History:  Procedure Laterality Date   CARDIOVERSION N/A 03/04/2015   Procedure: CARDIOVERSION;  Surgeon: [name], MD;  Location: [place];  Service: Cardiovascular;  Laterality: N/A;   CHOLECYSTECTOMY     COLONOSCOPY N/A 01/01/2016   Procedure: COLONOSCOPY;  Surgeon: [name], MD;  [facility];  Service: Endoscopy;  Laterality: N/A;  1:45   COLONOSCOPY N/A 01/14/2017   Procedure: COLONOSCOPY;  Surgeon: [name], MD;  [facility];  Service: Endoscopy;  Laterality: N/A;  12:05   EYE EXAMINATION UNDER ANESTHESIA W/ RETINAL CRYOTHERAPY AND RETINAL LASER  07/2011   EYE SURGERY  02/2011   cat rt   KNEE ARTHROSCOPY     bilateral   LUMBAR LAMINECTOMY/DECOMPRESSION  MICRODISCECTOMY N/A 08/18/2017   Procedure: Bilateral microlumbar decompression Lumbar four-Lumbar five;  Surgeon: [name], MD;  Location: [place];  Service: Orthopedics;  Laterality: N/A;   POLYPECTOMY  01/01/2016   Procedure: POLYPECTOMY;  Surgeon: [name], MD;  [facility];  Service: Endoscopy;;  colon   RETINAL DETACHMENT SURGERY  03/2011   rt   TOTAL KNEE ARTHROPLASTY  2003   rt   TOTAL KNEE ARTHROPLASTY  02/07/2012   Procedure: TOTAL KNEE ARTHROPLASTY;  Surgeon: [name], MD;  Location: [facility];  Service: Orthopedics;  Laterality: Left;   Current Outpatient Medications on File Prior to Visit  Medication Sig Dispense Refill   albuterol (ACCUNEB) 1.25 MG/3ML nebulizer solution Take 1 ampule by nebulization every 4 (four) hours as needed for wheezing.     albuterol (VENTOLIN HFA) 108 (90 Base) MCG/ACT inhaler Inhale 2 puffs into the lungs every 6 (six) hours as needed for wheezing or shortness of breath. 8.5 g 0   apixaban (ELIQUIS) 5 MG TABS tablet Take 1 tablet (5 mg total) by mouth 2 (two) times daily. 180 tablet 1   azelastine (ASTELIN) 0.1 % nasal spray Place 1 spray into both nostrils daily. Use in each nostril as directed 30 mL 12   carvedilol (COREG) 12.5 MG tablet Take 1.5 tablets (18.75 mg total) by mouth every morning and 1 tablet (12.5 mg) in the evening. 135 tablet 3   fluticasone (FLONASE) 50 MCG/ACT nasal spray SHAKE LIQUID AND USE 2 SPRAYS IN EACH NOSTRIL DAILY (Patient taking differently: Place 2 sprays into both nostrils daily as needed for allergies or rhinitis.) 16 g 6   furosemide (LASIX) 20 MG tablet Take 1 tablet (20 mg total) by mouth daily. 90 tablet 3   gabapentin (NEURONTIN) 300 MG capsule TAKE 2 CAPSULES (600 MG TOTAL) BY MOUTH 3 (THREE) TIMES DAILY. (Patient taking differently: Take 600 mg by mouth 3 (three) times daily.) 540 capsule 3   losartan (COZAAR) 25 MG tablet TAKE 1 TABLET (25 MG TOTAL) BY MOUTH DAILY. (Patient taking  differently: Take 25 mg by mouth daily.) 90 tablet 3   magic mouthwash w/lidocaine SOLN Swish and spit 72mL PO TID x5 days, then PRN- mouth pain. Steroid, Benadryl, Nystatin, Lidocaine 1:1 ratio. 100 mL 0   predniSONE (DELTASONE) 10 MG tablet Take 1 tablet (10 mg total) by mouth daily with breakfast. 90 tablet 3   temazepam (RESTORIL) 30 MG capsule Take 1 capsule (30 mg total) by mouth at bedtime as needed for sleep 30 capsule 2   potassium chloride SA (KLOR-CON) 20 MEQ tablet Take 2 tablets (  40 mEq total) by mouth daily for 7 days. 14 tablet 0   No current facility-administered medications on file prior to visit.   No Known Allergies Social History   Socioeconomic History   Marital status: Married    Spouse name: Not on file   Number of children: Not on file   Years of education: Not on file   Highest education level: Not on file  Occupational History   Not on file  Tobacco Use   Smoking status: Former    Packs/day: 2.00    Years: 15.00    Pack years: 30.00    Types: Cigarettes    Quit date: 02/02/1992    Years since quitting: 29.2   Smokeless tobacco: Never   Tobacco comments:    occ alcohol  Vaping Use   Vaping Use: Never used  Substance and Sexual Activity   Alcohol use: Yes    Alcohol/week: 0.0 standard drinks    Comment: occasional   Drug use: No   Sexual activity: Not on file  Other Topics Concern   Not on file  Social History Narrative   Not on file   Social Determinants of Health   Financial Resource Strain: Not on file  Food Insecurity: Not on file  Transportation Needs: Not on file  Physical Activity: Not on file  Stress: Not on file  Social Connections: Not on file  Intimate Partner Violence: Not on file      Review of Systems  All other systems reviewed and are negative.     Objective:   Physical Exam Vitals reviewed.  Constitutional:      General: He is not in acute distress.    Appearance: He is well-developed. He is not diaphoretic.   HENT:     Right Ear: Tympanic membrane, ear canal and external ear normal.     Left Ear: Tympanic membrane, ear canal and external ear normal.     Mouth/Throat:     Pharynx: No oropharyngeal exudate or posterior oropharyngeal erythema.  Neck:     Thyroid: No thyromegaly.     Vascular: No JVD.     Trachea: No tracheal deviation.  Cardiovascular:     Rate and Rhythm: Normal rate. Rhythm irregularly irregular.     Heart sounds: Normal heart sounds.  Pulmonary:     Effort: Pulmonary effort is normal.     Breath sounds: Decreased air movement present. Examination of the right-upper field reveals decreased breath sounds and wheezing. Examination of the left-upper field reveals decreased breath sounds and wheezing. Examination of the right-middle field reveals decreased breath sounds. Examination of the left-middle field reveals decreased breath sounds. Examination of the right-lower field reveals decreased breath sounds and wheezing. Examination of the left-lower field reveals decreased breath sounds and wheezing. Decreased breath sounds and wheezing present. No rhonchi or rales.  Chest:     Chest wall: No tenderness.  Abdominal:     General: Bowel sounds are normal. There is no distension.     Palpations: Abdomen is soft. There is no mass.     Tenderness: There is no abdominal tenderness. There is no guarding or rebound.  Musculoskeletal:     Cervical back: Neck supple.     Right hip: No tenderness or bony tenderness. Normal range of motion.     Left hip: No tenderness. Normal range of motion.     Right lower leg: No edema.     Left lower leg: No edema.  Lymphadenopathy:  Cervical: No cervical adenopathy.  Skin:    Coloration: Skin is not pale.     Findings: No erythema or rash.  Neurological:     Mental Status: He is alert and oriented to person, place, and time.     Cranial Nerves: No cranial nerve deficit.     Motor: No abnormal muscle tone.     Coordination: Coordination  normal.     Deep Tendon Reflexes: Reflexes normal.  Psychiatric:        Behavior: Behavior normal.        Thought Content: Thought content normal.        Judgment: Judgment normal.          Assessment & Plan:  Chronic obstructive pulmonary disease, unspecified COPD type ([place]) - Plan: DG Chest 2 View Based on his exam today, I do not see evidence of pneumonia.  I will send the patient for chest x-ray.  However I feel that the patient is most likely dealing with COPD.  He is not on any maintenance therapy.  Therefore I recommended starting him on Trelegy 1 inhalation daily.  I gave the patient samples to last for 6.  I would like to see the patient back in 6 weeks to see if his breathing is overall improved.  If worsening, seek medical attention immediately.  Obviously if there is evidence of pneumonia on chest x-ray I will add an antibiotic but at the present time I feel that he is most likely dealing with chronic obstructive pulmonary disease given the duration of symptoms and symptoms he is experiencing.

## 2021-04-23 ENCOUNTER — Ambulatory Visit: Payer: PPO | Admitting: Family Medicine

## 2021-04-24 ENCOUNTER — Telehealth: Payer: Self-pay | Admitting: *Deleted

## 2021-04-24 ENCOUNTER — Other Ambulatory Visit (HOSPITAL_COMMUNITY): Payer: Self-pay

## 2021-04-24 ENCOUNTER — Ambulatory Visit: Payer: PPO | Admitting: Cardiovascular Disease

## 2021-04-24 ENCOUNTER — Encounter: Payer: Self-pay | Admitting: Cardiovascular Disease

## 2021-04-24 ENCOUNTER — Other Ambulatory Visit: Payer: Self-pay

## 2021-04-24 VITALS — BP 118/78 | HR 69 | Ht 71.0 in | Wt 311.4 lb

## 2021-04-24 DIAGNOSIS — I10 Essential (primary) hypertension: Secondary | ICD-10-CM | POA: Diagnosis not present

## 2021-04-24 DIAGNOSIS — I5032 Chronic diastolic (congestive) heart failure: Secondary | ICD-10-CM

## 2021-04-24 DIAGNOSIS — R600 Localized edema: Secondary | ICD-10-CM

## 2021-04-24 DIAGNOSIS — Z7901 Long term (current) use of anticoagulants: Secondary | ICD-10-CM | POA: Diagnosis not present

## 2021-04-24 DIAGNOSIS — G4733 Obstructive sleep apnea (adult) (pediatric): Secondary | ICD-10-CM | POA: Diagnosis not present

## 2021-04-24 DIAGNOSIS — I4821 Permanent atrial fibrillation: Secondary | ICD-10-CM | POA: Diagnosis not present

## 2021-04-24 DIAGNOSIS — I517 Cardiomegaly: Secondary | ICD-10-CM | POA: Diagnosis not present

## 2021-04-24 MED ORDER — FUROSEMIDE 20 MG PO TABS
20.0000 mg | ORAL_TABLET | Freq: Every day | ORAL | 3 refills | Status: DC
  Filled 2021-04-24: qty 50, 50d supply, fill #0
  Filled 2021-06-29: qty 50, 30d supply, fill #0
  Filled 2021-09-28: qty 50, 30d supply, fill #1

## 2021-04-24 MED ORDER — ONDANSETRON HCL 4 MG PO TABS
4.0000 mg | ORAL_TABLET | Freq: Three times a day (TID) | ORAL | 0 refills | Status: DC | PRN
  Filled 2021-04-24: qty 20, 7d supply, fill #0

## 2021-04-24 NOTE — Telephone Encounter (Signed)
Pt wife called--stated since pt taking amoxicillin-clavulanate (AUGMENTIN) 875-125 MG tablet --been vomiting. Pt requesting for other alternative. Please advise.

## 2021-04-24 NOTE — Telephone Encounter (Signed)
Left detaile dmessage for patient that Diarrhea is normal on the augmentin.  Take yogurt to help and he can have zofran 4 mg poq 8 hrs for nausea.  Take 7 days total of abx and stop then.  Zofran sent to pharmacy.

## 2021-04-24 NOTE — Patient Instructions (Signed)
Medication Instructions:  TAKE FUROSEMIDE 40MG  DAILY x3 DAYS THEN 40MG  AND 20MG  ALTERNATING DAILY  *If you need a refill on your cardiac medications before your next appointment, please call your pharmacy*  Lab Work: NONE  Testing/Procedures: Echocardiogram IN ABOUT 7 MONTHS- Your physician has requested that you have an echocardiogram. Echocardiography is a painless test that uses sound waves to create images of your heart. It provides your doctor with information about the size and shape of your heart and how well your hearts chambers and valves are working. This procedure takes approximately one hour. There are no restrictions for this procedure. This will be performed at either our [facility] location - [address], [place] location [facility] 2nd floor.  Follow-Up: Your next appointment:  AFTER ECHO  In Person with Shelva Majestic, MD  At [facility], you and your health needs are our priority.  As part of our continuing mission to provide you with exceptional heart care, we have created designated Provider Care Teams.  These Care Teams include your primary Cardiologist (physician) and Advanced Practice Providers (APPs -  Physician Assistants and Nurse Practitioners) who all work together to provide you with the care you need, when you need it.  We recommend signing up for the patient portal called "MyChart".  Sign up information is provided on this After Visit Summary.  MyChart is used to connect with patients for Virtual Visits (Telemedicine).  Patients are able to view lab/test results, encounter notes, upcoming appointments, etc.  Non-urgent messages can be sent to your provider as well.   To learn more about what you can do with MyChart, go to [email or web address].

## 2021-04-24 NOTE — Progress Notes (Signed)
Patient ID: Garrett Munoz, male   DOB: 1945-08-30, 76 y.o.   MRN: [id number]     HPI: ROSALIO Munoz is a 76 y.o. male who presents to the office today for an 11 month follow-up evaluation.  Mr. Clasby has a history of hypertension, and remotely had taken blood pressure medications for several years but none since the last 20 years. He has a history of prior hypertension, obstructive sleep apnea, untreated due to previous intolerance to full face mask, as well as obesity.  He admits to having an intermittent irregular rhythm.  He was scheduled to undergo elective surgery on his right foot hammertoe by Dr. Fritzi Mandes.  Surgery was canceled due to concerns for possible Mobitz type II block with possible atrial flutter.  He was seen by me for preoperative evaluation on 01/30/2015.  At that time, his ECG demonstrated atrial fibrillation with ventricular rate at approximately 100 bpm.  He was noted have small Q wave in lead 3.  He was started on Toprol 25 mg for 4 days and this was titrated up to 50 mg.  He also was started on eloquence 5 mg twice a day for anticoagulation.  A 2-D echo Doppler study on February 18 2015 revealed an ejection fraction of 55-60%.  There was moderate left ventricular hypertrophy.  The left atrium was moderately dilated.  A nuclear perfusion study done on 02/13/2015 was low risk without ischemia with only a mild apical defect, probably attenuation. He denies any chest pain.  He denies shortness of breath.  He is been unable to walk secondary to his toe abnormality.  He admits to daytime sleepiness and snoring.  He had not utilized CPAP therapy in years.  He is status post cataract surgery with lens implant.    He underwent cardioversion on March 04, 2015 and was successfully converted back to sinus rhythm.  On 04/10/2015 he was admitted with community-acquired pneumonia and was found to be back in atrial fibrillation.  He denies any episodes of chest pain. He has been on eliquis 5  mg twice a day for anticoagulation , carvedilol 6.25 mg twice a day.  He is unaware of his rhythm being abnormal. He is not sleeping well but has not been using his CPAP. He feels fatigued.  When I saw him in March 2017 he continued to be in atrial fibrillation.  After much discussion, concerning another attempt at trying to convert into sinus rhythm versus staying in permanent atrial fibrillation he opted to stay in permanent atrial fibrillation.  He has continued to be on anticoagulation therapy.  I saw him in October 2018 he continued to have difficulty with low back pain as well as swelling in his hands due to arthritis.  He sees Dr. Dennard Schaumann for primary care.  He denied any recent episodes of chest pain.  He denies palpitations.  He has not been successful with weight loss and his BMI has increased up to 42.3.  Laboratory in April 2018 had shown glucose increased at 107.  Lipid studies revealed a cholesterol of 134, triglycerides 103, HDL 50, and LDL 63.    I  saw him in November 2019.  At that time, he was not using CPAP.  He was not sleeping well and often would have to take naps.  He is atrial fibrillation rate was controlled on carvedilol 18.75 mg in the morning and 12.5 mg in the evening and he continued to be on Eliquis without bleeding.  During that evaluation we  discussed the possibility of reevaluating his sleep apnea.  I discussed new mask technology however he opted against doing this.  I saw him in November 2020 and over the prior year he denied any recurrent chest pain.  He was having difficulty with low back discomfort which was limiting his walking.  He also underwent carpal tunnel surgery of his right hand.  He admits to some weight gain.  He is not been exercising during this Covid 19 pandemic.  He had laboratory in May 2020.  TSH was 0.88.  Hemoglobin hematocrit were stable at 16.8 and 48.1.  Glucose was 99.  Renal function was normal with a BUN of 13 and a creatinine of 0.88.  LFTs were  normal.  Lipid studies in 2019 showed a total cholesterol 138 LDL cholesterol 70 HDL 53 and triglycerides 73.   I last saw him on May 26, 2020.  Since my prior evaluation he was hospitalized in November 2021 with CHF exacerbation.  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  He had not been using his CPAP and apparently in the hospital did not like BiPAP.  An echo Doppler study on March 09, 2020 showed an EF at 60 to 65%.  There was severe LVH.  Diastolic function could not be assessed.  He had mild to moderate left atrial dilatation.  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  Following his hospitalization he was evaluated by Dr. Halford Chessman and he has been scheduled for a subsequent home sleep study to reassess his previously diagnosed and untreated sleep apnea.  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 Vernelle Emerald.  He has been on methotrexate weekly.  During his evaluation with me, his blood pressure was elevated and with his diastolic dysfunction I recommended initiation of low-dose losartan at 25 mg daily.  Since I last saw him, he states he has had pneumonia on 3 instances secondary to aspiration.  He apparently was evaluated ENT and pulmonary was felt to have aspiration.  Only, he admits to some increased shortness of breath and leg swelling.  He has been evaluated by Dr. [name] for his COPD and was treated with BiPAP therapy.  He has not been using this consistently.  He presents for reevaluation.  Past Medical History:  Diagnosis Date   Atrial fibrillation ([place])    Bronchitis    hx   Cataract    right eye   CHF NYHA class III, acute on chronic, diastolic (HCC)    COPD (chronic obstructive pulmonary disease) (HCC)    DDD (degenerative disc disease), lumbosacral    Dysrhythmia    irregular heatbeat   Eye worm    right eye   Gout    History of kidney  stones    Hypertension    Left knee DJD    Morbid obesity with BMI of 40.0-44.9, adult (HCC)    Pneumonia    hx   Prediabetes    Radicular syndrome of left leg    S/P total knee replacement    right   Sleep apnea    can't use sleep study 10 yrs ago   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     Past Surgical History:  Procedure Laterality Date   CARDIOVERSION N/A 03/04/2015   Procedure: CARDIOVERSION;  Surgeon: [name], MD;  Location: [place];  Service: Cardiovascular;  Laterality:  N/A;   CHOLECYSTECTOMY     COLONOSCOPY N/A 01/01/2016   Procedure: COLONOSCOPY;  Surgeon: [name], MD;  [facility];  Service: Endoscopy;  Laterality: N/A;  1:45   COLONOSCOPY N/A 01/14/2017   Procedure: COLONOSCOPY;  Surgeon: [name], MD;  [facility];  Service: Endoscopy;  Laterality: N/A;  12:05   EYE EXAMINATION UNDER ANESTHESIA W/ RETINAL CRYOTHERAPY AND RETINAL LASER  07/2011   EYE SURGERY  02/2011   cat rt   KNEE ARTHROSCOPY     bilateral   LUMBAR LAMINECTOMY/DECOMPRESSION MICRODISCECTOMY N/A 08/18/2017   Procedure: Bilateral microlumbar decompression Lumbar four-Lumbar five;  Surgeon: [name], MD;  Location: [place];  Service: Orthopedics;  Laterality: N/A;   POLYPECTOMY  01/01/2016   Procedure: POLYPECTOMY;  Surgeon: [name], MD;  [facility];  Service: Endoscopy;;  colon   RETINAL DETACHMENT SURGERY  03/2011   rt   TOTAL KNEE ARTHROPLASTY  2003   rt   TOTAL KNEE ARTHROPLASTY  02/07/2012   Procedure: TOTAL KNEE ARTHROPLASTY;  Surgeon: [name], MD;  Location: [place];  Service: Orthopedics;  Laterality: Left;    No Known Allergies  Current Outpatient Medications  Medication Sig Dispense Refill   albuterol (ACCUNEB) 1.25 MG/3ML nebulizer solution Take 1 ampule by nebulization every 4 (four) hours as needed for wheezing.     albuterol (VENTOLIN HFA) 108 (90 Base) MCG/ACT inhaler Inhale 2 puffs into the lungs every 6 (six)  hours as needed for wheezing or shortness of breath. 8.5 g 0   apixaban (ELIQUIS) 5 MG TABS tablet Take 1 tablet (5 mg total) by mouth 2 (two) times daily. 180 tablet 1   carvedilol (COREG) 12.5 MG tablet Take 1.5 tablets (18.75 mg total) by mouth every morning and 1 tablet (12.5 mg) in the evening. 135 tablet 3   fluticasone (FLONASE) 50 MCG/ACT nasal spray SHAKE LIQUID AND USE 2 SPRAYS IN EACH NOSTRIL DAILY (Patient taking differently: Place 2 sprays into both nostrils daily as needed for allergies or rhinitis.) 16 g 6   gabapentin (NEURONTIN) 300 MG capsule TAKE 2 CAPSULES (600 MG TOTAL) BY MOUTH 3 (THREE) TIMES DAILY. (Patient taking differently: Take 600 mg by mouth 3 (three) times daily.) 540 capsule 3   losartan (COZAAR) 25 MG tablet TAKE 1 TABLET (25 MG TOTAL) BY MOUTH DAILY. (Patient taking differently: Take 25 mg by mouth daily.) 90 tablet 3   magic mouthwash w/lidocaine SOLN Swish and spit 48m PO TID x5 days, then PRN- mouth pain. Steroid, Benadryl, Nystatin, Lidocaine 1:1 ratio. 100 mL 0   predniSONE (DELTASONE) 10 MG tablet Take 1 tablet (10 mg total) by mouth daily with breakfast. 90 tablet 3   temazepam (RESTORIL) 30 MG capsule Take 1 capsule (30 mg total) by mouth at bedtime as needed for sleep 30 capsule 2   furosemide (LASIX) 20 MG tablet Take 2 tablets (40 mg total) by mouth daily for 3 days. Then change to 2 tablets (40 mg total) daily alternating with 1 tablet (20 mg total) daily. 50 tablet 3   ondansetron (ZOFRAN) 4 MG tablet Take 1 tablet (4 mg total) by mouth every 8 (eight) hours as needed for nausea or vomiting. 20 tablet 0   No current facility-administered medications for this visit.    Social History   Socioeconomic History   Marital status: Married    Spouse name: Not on file   Number of children: Not on file  Years of education: Not on file   Highest education level: Not on file  Occupational History   Not on file  Tobacco Use   Smoking status: Former     Packs/day: 2.00    Years: 15.00    Pack years: 30.00    Types: Cigarettes    Quit date: 02/02/1992    Years since quitting: 29.2   Smokeless tobacco: Never   Tobacco comments:    occ alcohol  Vaping Use   Vaping Use: Never used  Substance and Sexual Activity   Alcohol use: Yes    Alcohol/week: 0.0 standard drinks    Comment: occasional   Drug use: No   Sexual activity: Not on file  Other Topics Concern   Not on file  Social History Narrative   Not on file   Social Determinants of Health   Financial Resource Strain: Not on file  Food Insecurity: Not on file  Transportation Needs: Not on file  Physical Activity: Not on file  Stress: Not on file  Social Connections: Not on file  Intimate Partner Violence: Not on file   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  He is married.   Family History  Problem Relation Age of Onset   Lung cancer Father    Family history is notable that his father died at 21 with lung CA and his mother died at 6.  She had an irregular heart rhythm.  He has 2 sisters and one is deceased secondary to cancer and 1 brother.  ROS General: Negative; No fevers, chills, or night sweats.  Positive for morbid obesity. HEENT: He has a right eye lens  implant after cataract surgery No changes in vision or hearing, sinus congestion, difficulty swallowing Pulmonary: Recent recurrent pneumonias felt to be contributed by aspiration Cardiovascular: See HPI:  GI: Negative; No nausea, vomiting, diarrhea, or abdominal pain GU: Negative; No dysuria, hematuria, or difficulty voiding Musculoskeletal: History of remote right total knee replacement and left total knee replacement.  Neck low back discomfort.  Status post right carpal tunnel surgery recent low back pain from lumbar spine; positive for polymyalgia rheumatica Hematologic: Negative; no easy bruising, bleeding Endocrine: Negative; no heat/cold intolerance; no  diabetes, Neuro: Negative; no changes in balance, headaches Skin: Negative; No rashes or skin lesions Psychiatric: Negative; No behavioral problems, depression Sleep: Positive for sleep apnea; positive for snoring and hypersomnolence. Other comprehensive 14 point system review is negative   Physical Exam BP 118/78    Pulse 69    Ht '5\' 11"'  (1.803 m)    Wt (!) 311 lb 6.4 oz (141.3 kg)    SpO2 94%    BMI 43.43 kg/m    Repeat blood pressure by me was 128/74  Wt Readings from Last 3 Encounters:  04/24/21 (!) 311 lb 6.4 oz (141.3 kg)  04/17/21 (!) 310 lb (140.6 kg)  03/20/21 (!) 312 lb 12.8 oz (141.9 kg)   General: Alert, oriented, no distress.  Skin: normal turgor, no rashes, warm and dry HEENT: Normocephalic, atraumatic. Pupils equal round and reactive to light; sclera anicteric; extraocular muscles intact;  Nose without nasal septal hypertrophy Mouth/Parynx benign; Mallinpatti scale 3 Neck: No JVD, no carotid bruits; normal carotid upstroke Lungs: clear to ausculatation and percussion; no wheezing or rales Chest wall: without tenderness to palpitation Heart: PMI not displaced, regular irregular consistent with atrial fibrillation with a ventricular rate in the 60s to 70s., s1 s2 normal,  1/6 systolic murmur, no diastolic murmur, no rubs, gallops, thrills, or heaves Abdomen: soft, nontender; no hepatosplenomehaly, BS+; abdominal aorta nontender and not dilated by palpation. Back: no CVA tenderness Pulses 2+ Musculoskeletal: full range of motion, normal strength, no joint deformities Extremities: Bilateral lower extremity edema;  no clubbing cyanosis, Homan's sign negative  Neurologic: grossly nonfocal; Cranial nerves grossly wnl Psychologic: Normal mood and affect    April 24, 2021 ECG (independently read by me): Atrial fibrillation at 69, IRBBB  May 26, 2020 ECG (independently read by me): Atrial fibrillation at 67, IRBBB; QTc 399 msec  November 2020 ECG (independently read  by me): Atrial Fibrillation at 63  November 2019 ECG (independently read by me): Atrial fibrillation 81 bpm.  QTc interval 422 ms.  Early transition.  October 2018 ECG (independently read by me): Atrial fibrillation with ventricular rate at 79 bpm.  March 2018 ECG (independently read by me): Atrial fibrillation at 72 bpm with PVC.  September 2017 ECG (independently read by me): Atrial fibrillation with ventricular rate at 10 2 bpm.  Occasional unifocal PVCs versus a bare complex.  March 2017 ECG (independently read by me):  Atrial fibrillation at 83 bpm.  02/21/2015 ECG (independently read by me): Atrial fibrillation at 99 bpm.  01/30/2015 ECG (independently read by me): Atrial fibrillation at a approximately 90-100 bpm.  Small Q wave in lead 3.  Early transition.  LABS:  BMP Latest Ref Rng & Units 01/12/2021 01/08/2021 01/07/2021  Glucose 65 - 99 mg/dL 109(H) 104(H) 100(H)  BUN 7 - 25 mg/dL '19 20 23  ' Creatinine 0.70 - 1.28 mg/dL 0.99 0.94 0.78  BUN/Creat Ratio 6 - 22 (calc) NOT APPLICABLE - -  Sodium 626 - 146 mmol/L 140 138 139  Potassium 3.5 - 5.3 mmol/L 4.5 3.3(L) 3.3(L)  Chloride 98 - 110 mmol/L 93(L) 88(L) 89(L)  CO2 20 - 32 mmol/L 37(H) 43(H) 40(H)  Calcium 8.6 - 10.3 mg/dL 9.5 9.0 8.9    Hepatic Function Latest Ref Rng & Units 01/12/2021 01/05/2021 12/29/2020  Total Protein 6.1 - 8.1 g/dL 6.3 6.3(L) 7.2  Albumin 3.5 - 5.0 g/dL - 3.1(L) 3.8  AST 10 - 35 U/L 20 33 20  ALT 9 - 46 U/L '22 29 29  ' Alk Phosphatase 38 - 126 U/L - 51 60  Total Bilirubin 0.2 - 1.2 mg/dL 0.4 1.8(H) 0.9  Bilirubin, Direct 0.1 - 0.5 mg/dL - - -    CBC Latest Ref Rng & Units 01/12/2021 01/07/2021 01/05/2021  WBC 3.8 - 10.8 Thousand/uL 9.0 8.7 10.3  Hemoglobin 13.2 - 17.1 g/dL 15.9 16.1 16.1  Hematocrit 38.5 - 50.0 % 46.7 48.5 51.0  Platelets 140 - 400 Thousand/uL 214 142(L) 165   Lab Results  Component Value Date   MCV 93.4 01/12/2021   MCV 97.6 01/07/2021   MCV 99.2 01/05/2021    Lab Results   Component Value Date   TSH 0.978 10/16/2020    BNP    Component Value Date/Time   BNP 125.9 (H) 12/29/2020 0839   BNP 102 (H) 06/04/2019 1026    ProBNP No results found for: PROBNP   Lipid Panel     Component Value Date/Time   CHOL 136 06/09/2020 0953   CHOL 147 12/05/2015 0901   TRIG 66 12/30/2020 0247   HDL 62 06/09/2020 0953   HDL 55 12/05/2015 0901   CHOLHDL 2.2 06/09/2020 0953   VLDL 21 07/23/2016 0805   LDLCALC 60 06/09/2020 0953     RADIOLOGY: DG Chest 2  View  Result Date: 04/17/2021 CLINICAL DATA:  76 year old male with history of cough and shortness of breath. EXAM: CHEST - 2 VIEW COMPARISON:  01/16/2021, 01/06/2021 FINDINGS: The mediastinal contours are within normal limits. Similar appearing mild cardiomegaly. Interval development of streaky opacities about the medial aspect of the right middle lobe. Similar appearing diffuse bronchial wall thickening. No pleural effusion or pneumothorax. No acute osseous abnormality. IMPRESSION: Right middle lobe subsegmental atelectasis versus infiltrate. Electronically Signed   By: Ruthann Cancer M.D.   On: 04/17/2021 16:01    IMPRESSION:  1. Essential hypertension   2. Permanent atrial fibrillation ([place])   3. Chronic diastolic heart failure ([place])   4. LVH (left ventricular hypertrophy)   5. Bilateral lower extremity edema   6. Chronic anticoagulation - Eliquis   7. OSA (obstructive sleep apnea)   8. Morbid obesity, unspecified obesity type [facility])     ASSESSMENT AND PLAN: Mr. Varnell Orvis is a 76 year-old gentleman who has a history of morbid obesity, hypertension, obstructive sleep apnea, and permanent atrial fibrillation.  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  An echo Doppler study showed an EF demonstrated normal systolic function but he had severe left ventricular hypertrophy.  I suspect he had diastolic heart  failure.  He was noted to have mild dilatation of his aortic root and ascending aorta.  He had mild to moderate left atrial dilatation.  He has been on chronic anticoagulation with Eliquis currently at 5 mg twice a day.  Since his last evaluation with me he has developed pneumonia and also has had COPD exacerbation.  He apparently was told that he was aspirating and underwent an ENT evaluation as well as follow-up pulmonary evaluation with Dr. [name].  His blood pressure today is stable.  He has been on prednisone daily for polymyalgia.  He has been on losartan 25 mg daily, carvedilol 18.75 mg in the morning and 12.5 mg in the evening and furosemide 20 mg daily.  I recommended for the next 3 days he increase his Lasix to 40 mg and then alternate 40 with 20 mg every other day pending upon his leg edema.  I have recommended he continue using his BiPAP on a daily basis.  We discussed the importance of weight loss with his morbid obesity and BMI of 43.4.  He will follow-up with Dr. Dennard Schaumann who is his primary physician.  In 6 months I have recommended he undergo a follow-up echo Doppler study for reassessment of LV systolic and diastolic function and I will see him at that time for follow-up evaluation.   [name], MD, [facility]  04/26/2021 2:40 PM

## 2021-04-24 NOTE — Telephone Encounter (Signed)
Patient is experiencing diarrhea and nausea.  Advised he stop his medication until otherwise advised.  Patient has an appointment with heart doctor this afternoon and will discuss with them as well.  Please advise.

## 2021-04-26 ENCOUNTER — Encounter: Payer: Self-pay | Admitting: Cardiovascular Disease

## 2021-04-30 ENCOUNTER — Other Ambulatory Visit (HOSPITAL_COMMUNITY): Payer: Self-pay

## 2021-05-01 ENCOUNTER — Other Ambulatory Visit (HOSPITAL_COMMUNITY): Payer: Self-pay

## 2021-05-12 ENCOUNTER — Telehealth: Payer: Self-pay

## 2021-05-12 ENCOUNTER — Other Ambulatory Visit (HOSPITAL_COMMUNITY): Payer: Self-pay

## 2021-05-12 ENCOUNTER — Other Ambulatory Visit: Payer: Self-pay | Admitting: Family Medicine

## 2021-05-12 MED ORDER — CLOTRIMAZOLE-BETAMETHASONE 1-0.05 % EX CREA
1.0000 "application " | TOPICAL_CREAM | Freq: Two times a day (BID) | CUTANEOUS | 0 refills | Status: DC
  Filled 2021-05-12: qty 30, 15d supply, fill #0

## 2021-05-12 NOTE — Telephone Encounter (Signed)
Pt's wife called to report pt has continued rash in his groin area that is red and itchy. They would like to know if there is something you can prescribe to help?  Please advise, thank you!

## 2021-05-12 NOTE — Telephone Encounter (Signed)
[place] to advise

## 2021-05-13 NOTE — Telephone Encounter (Signed)
[place] to advise

## 2021-05-20 ENCOUNTER — Other Ambulatory Visit (HOSPITAL_COMMUNITY): Payer: Self-pay

## 2021-06-03 ENCOUNTER — Other Ambulatory Visit (HOSPITAL_COMMUNITY): Payer: Self-pay

## 2021-06-11 ENCOUNTER — Other Ambulatory Visit: Payer: Self-pay

## 2021-06-11 ENCOUNTER — Other Ambulatory Visit (HOSPITAL_COMMUNITY): Payer: Self-pay

## 2021-06-11 ENCOUNTER — Encounter: Payer: Self-pay | Admitting: Family Medicine

## 2021-06-11 ENCOUNTER — Ambulatory Visit (INDEPENDENT_AMBULATORY_CARE_PROVIDER_SITE_OTHER): Payer: PPO | Admitting: Family Medicine

## 2021-06-11 VITALS — BP 128/78 | HR 88 | Temp 97.2°F | Resp 18 | Ht 71.0 in | Wt 312.0 lb

## 2021-06-11 DIAGNOSIS — I509 Heart failure, unspecified: Secondary | ICD-10-CM

## 2021-06-11 DIAGNOSIS — J449 Chronic obstructive pulmonary disease, unspecified: Secondary | ICD-10-CM

## 2021-06-11 DIAGNOSIS — M545 Low back pain, unspecified: Secondary | ICD-10-CM

## 2021-06-11 DIAGNOSIS — M353 Polymyalgia rheumatica: Secondary | ICD-10-CM

## 2021-06-11 DIAGNOSIS — Z0000 Encounter for general adult medical examination without abnormal findings: Secondary | ICD-10-CM | POA: Diagnosis not present

## 2021-06-11 DIAGNOSIS — M159 Polyosteoarthritis, unspecified: Secondary | ICD-10-CM | POA: Diagnosis not present

## 2021-06-11 LAB — CBC WITH DIFFERENTIAL/PLATELET
Absolute Monocytes: 847 cells/uL (ref 200–950)
Basophils Absolute: 71 cells/uL (ref 0–200)
Basophils Relative: 0.7 %
Eosinophils Absolute: 306 cells/uL (ref 15–500)
Eosinophils Relative: 3 %
HCT: 49.3 % (ref 38.5–50.0)
Hemoglobin: 16.5 g/dL (ref 13.2–17.1)
Lymphs Abs: 2377 cells/uL (ref 850–3900)
MCH: 31.9 pg (ref 27.0–33.0)
MCHC: 33.5 g/dL (ref 32.0–36.0)
MCV: 95.2 fL (ref 80.0–100.0)
MPV: 11.4 fL (ref 7.5–12.5)
Monocytes Relative: 8.3 %
Neutro Abs: 6599 cells/uL (ref 1500–7800)
Neutrophils Relative %: 64.7 %
Platelets: 141 10*3/uL (ref 140–400)
RBC: 5.18 10*6/uL (ref 4.20–5.80)
RDW: 13.6 % (ref 11.0–15.0)
Total Lymphocyte: 23.3 %
WBC: 10.2 10*3/uL (ref 3.8–10.8)

## 2021-06-11 LAB — COMPLETE METABOLIC PANEL WITH GFR
AG Ratio: 1.5 (calc) (ref 1.0–2.5)
ALT: 17 U/L (ref 9–46)
AST: 15 U/L (ref 10–35)
Albumin: 3.8 g/dL (ref 3.6–5.1)
Alkaline phosphatase (APISO): 50 U/L (ref 35–144)
BUN: 12 mg/dL (ref 7–25)
CO2: 29 mmol/L (ref 20–32)
Calcium: 9.1 mg/dL (ref 8.6–10.3)
Chloride: 105 mmol/L (ref 98–110)
Creat: 0.99 mg/dL (ref 0.70–1.28)
Globulin: 2.5 g/dL (calc) (ref 1.9–3.7)
Glucose, Bld: 95 mg/dL (ref 65–99)
Potassium: 3.9 mmol/L (ref 3.5–5.3)
Sodium: 142 mmol/L (ref 135–146)
Total Bilirubin: 0.6 mg/dL (ref 0.2–1.2)
Total Protein: 6.3 g/dL (ref 6.1–8.1)
eGFR: 78 mL/min/{1.73_m2} (ref >=60)

## 2021-06-11 LAB — LIPID PANEL
Cholesterol: 150 mg/dL (ref <200)
HDL: 59 mg/dL (ref >=40)
LDL Cholesterol (Calc): 76 mg/dL (calc)
Non-HDL Cholesterol (Calc): 91 mg/dL (calc) (ref <130)
Total CHOL/HDL Ratio: 2.5 (calc) (ref <5.0)
Triglycerides: 70 mg/dL (ref <150)

## 2021-06-11 MED ORDER — OXYCODONE-ACETAMINOPHEN 7.5-325 MG PO TABS
1.0000 | ORAL_TABLET | ORAL | 0 refills | Status: DC | PRN
  Filled 2021-06-11: qty 30, 5d supply, fill #0

## 2021-06-11 NOTE — Progress Notes (Signed)
Wt Readings from Last 3 Encounters:  06/11/21 (!) 312 lb (141.5 kg)  04/24/21 (!) 311 lb 6.4 oz (141.3 kg)  04/17/21 (!) 310 lb (140.6 kg)      Subjective:    Patient ID: Garrett Munoz, male    DOB: 25-Sep-1945, 76 y.o.   MRN: [id number] Patient is here today for complete physical exam.  I reviewed his shot record.  His shots are up-to-date except for his COVID vaccination. Immunization History  Administered Date(s) Administered   Fluad Quad(high Dose 65+) 12/20/2018, 01/09/2020, 01/28/2021   Influenza, High Dose Seasonal PF 01/04/2017   Influenza,inj,Quad PF,6+ Mos 01/04/2018   Influenza-Unspecified 01/18/2016, 01/24/2017   Moderna Sars-Covid-2 Vaccination 05/01/2019, 05/28/2019, 12/25/2019   Pneumococcal Conjugate-13 01/04/2017, 02/17/2017   Pneumococcal-Unspecified 02/17/2017   Zoster Recombinat (Shingrix) 12/07/2017, 02/13/2018   Patient is hesitant to receive the COVID booster however he has a history of PMR on chronic steroids, diastolic heart failure, atrial fibrillation, morbid obesity, and COPD.  Therefore I strongly encouraged the patient to get the COVID booster.  Patient had a colonoscopy in 2021 per his report and he states that he was "turned loose".  Due to his age he does not require prostate cancer screening.  Today on exam he has +2 edema in both legs distal to the knee.  His wife states this is because he is been sedentary for the last few days due to severe back pain.  He reports pain in the center of his back roughly at the level of L5.  He also denies any sciatica.  However movement causes intense pain.  It hurts for him to stand, sit up, stand up, or turn or twist.  Pain sounds musculoskeletal.  He denies any dysuria or urgency or frequency.  echo 2022- 1. Left ventricular ejection fraction, by estimation, is 55 to 60%. The  left ventricle has normal function. The left ventricle has no regional  wall motion abnormalities. Left ventricular diastolic parameters are   indeterminate.   2. Right ventricular systolic function is mildly reduced. The right  ventricular size is normal.   3. The mitral valve is normal in structure. Trivial mitral valve  regurgitation. No evidence of mitral stenosis.   4. The aortic valve is tricuspid. There is mild calcification of the  aortic valve. Aortic valve regurgitation is not visualized. No aortic  stenosis is present.   5. The inferior vena cava is normal in size with greater than 50%  respiratory variability, suggesting right atrial pressure of 3 mmHg  MBS- 2022 Pharyngeal swallow marked by decreased adequacy and timing of swallow with liquids allowing laryngeal penetration during the swallow of nectar and thin and mild silent aspiration.  In addition, pt pyriform sinus retention appears to spill into airway post-swallow - as noted in larynx/trachea when flouro turned back on.  Recommend advance diet to dys3/thin - prefer water with meals.  Tight chin tuck posture with liquids with continued multiple swallows to clear oropharyngeal retention as much able.  Past Medical History:  Diagnosis Date   Atrial fibrillation ([place])    Bronchitis    hx   Cataract    right eye   CHF NYHA class III, acute on chronic, diastolic (HCC)    COPD (chronic obstructive pulmonary disease) (HCC)    DDD (degenerative disc disease), lumbosacral    Dysrhythmia    irregular heatbeat   Eye worm    right eye   Gout    History of kidney stones    Hypertension  Left knee DJD    Morbid obesity with BMI of 40.0-44.9, adult (HCC)    Pneumonia    hx   Prediabetes    Radicular syndrome of left leg    S/P total knee replacement    right   Sleep apnea    can't use sleep study 10 yrs ago   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    Past Surgical History:  Procedure Laterality Date   CARDIOVERSION N/A 03/04/2015   Procedure: CARDIOVERSION;  Surgeon: [name], MD;  Location: [facility];  Service: Cardiovascular;  Laterality: N/A;    CHOLECYSTECTOMY     COLONOSCOPY N/A 01/01/2016   Procedure: COLONOSCOPY;  Surgeon: [name], MD;  [facility];  Service: Endoscopy;  Laterality: N/A;  1:45   COLONOSCOPY N/A 01/14/2017   Procedure: COLONOSCOPY;  Surgeon: [name], MD;  [facility];  Service: Endoscopy;  Laterality: N/A;  12:05   EYE EXAMINATION UNDER ANESTHESIA W/ RETINAL CRYOTHERAPY AND RETINAL LASER  07/2011   EYE SURGERY  02/2011   cat rt   KNEE ARTHROSCOPY     bilateral   LUMBAR LAMINECTOMY/DECOMPRESSION MICRODISCECTOMY N/A 08/18/2017   Procedure: Bilateral microlumbar decompression Lumbar four-Lumbar five;  Surgeon: [name], MD;  Location: [facility];  Service: Orthopedics;  Laterality: N/A;   POLYPECTOMY  01/01/2016   Procedure: POLYPECTOMY;  Surgeon: [name], MD;  [facility];  Service: Endoscopy;;  colon   RETINAL DETACHMENT SURGERY  03/2011   rt   TOTAL KNEE ARTHROPLASTY  2003   rt   TOTAL KNEE ARTHROPLASTY  02/07/2012   Procedure: TOTAL KNEE ARTHROPLASTY;  Surgeon: [name], MD;  Location: [place];  Service: Orthopedics;  Laterality: Left;   Current Outpatient Medications on File Prior to Visit  Medication Sig Dispense Refill   albuterol (ACCUNEB) 1.25 MG/3ML nebulizer solution Take 1 ampule by nebulization every 4 (four) hours as needed for wheezing.     albuterol (VENTOLIN HFA) 108 (90 Base) MCG/ACT inhaler Inhale 2 puffs into the lungs every 6 (six) hours as needed for wheezing or shortness of breath. 8.5 g 0   apixaban (ELIQUIS) 5 MG TABS tablet Take 1 tablet (5 mg total) by mouth 2 (two) times daily. 180 tablet 1   carvedilol (COREG) 12.5 MG tablet Take 1.5 tablets (18.75 mg total) by mouth every morning and 1 tablet (12.5 mg) in the evening. 135 tablet 3   clotrimazole-betamethasone (LOTRISONE) cream Apply 1 application topically 2 (two) times daily. 30 g 0   fluticasone (FLONASE) 50 MCG/ACT nasal spray SHAKE LIQUID AND USE 2 SPRAYS IN EACH  NOSTRIL DAILY (Patient taking differently: Place 2 sprays into both nostrils daily as needed for allergies or rhinitis.) 16 g 6   furosemide (LASIX) 20 MG tablet Take 2 tablets (40 mg total) by mouth daily for 3 days. Then change to 2 tablets (40 mg total) daily alternating with 1 tablet (20 mg total) daily. 50 tablet 3   gabapentin (NEURONTIN) 300 MG capsule TAKE 2 CAPSULES (600 MG TOTAL) BY MOUTH 3 (THREE) TIMES DAILY. (Patient taking differently: Take 600 mg by mouth 3 (three) times daily.) 540 capsule 3   losartan (COZAAR) 25 MG tablet TAKE 1 TABLET (25 MG TOTAL) BY MOUTH DAILY. (Patient taking differently: Take 25 mg by mouth daily.) 90 tablet 3   magic mouthwash w/lidocaine SOLN Swish and spit 17mL PO TID x5 days, then PRN- mouth pain. Steroid, Benadryl, Nystatin, Lidocaine 1:1 ratio. 100  mL 0   ondansetron (ZOFRAN) 4 MG tablet Take 1 tablet (4 mg total) by mouth every 8 (eight) hours as needed for nausea or vomiting. 20 tablet 0   predniSONE (DELTASONE) 10 MG tablet Take 1 tablet (10 mg total) by mouth daily with breakfast. 90 tablet 3   temazepam (RESTORIL) 30 MG capsule Take 1 capsule (30 mg total) by mouth at bedtime as needed for sleep 30 capsule 2   No current facility-administered medications on file prior to visit.   No Known Allergies Social History   Socioeconomic History   Marital status: Married    Spouse name: Not on file   Number of children: Not on file   Years of education: Not on file   Highest education level: Not on file  Occupational History   Not on file  Tobacco Use   Smoking status: Former    Packs/day: 2.00    Years: 15.00    Pack years: 30.00    Types: Cigarettes    Quit date: 02/02/1992    Years since quitting: 29.3   Smokeless tobacco: Never   Tobacco comments:    occ alcohol  Vaping Use   Vaping Use: Never used  Substance and Sexual Activity   Alcohol use: Yes    Alcohol/week: 0.0 standard drinks    Comment: occasional   Drug use: No   Sexual  activity: Not on file  Other Topics Concern   Not on file  Social History Narrative   Not on file   Social Determinants of Health   Financial Resource Strain: Not on file  Food Insecurity: Not on file  Transportation Needs: Not on file  Physical Activity: Not on file  Stress: Not on file  Social Connections: Not on file  Intimate Partner Violence: Not on file      Review of Systems  All other systems reviewed and are negative.     Objective:   Physical Exam Vitals reviewed.  Constitutional:      General: He is not in acute distress.    Appearance: Normal appearance. He is well-developed. He is obese. He is not ill-appearing, toxic-appearing or diaphoretic.  HENT:     Head: Normocephalic and atraumatic.     Right Ear: Tympanic membrane, ear canal and external ear normal.     Left Ear: Tympanic membrane, ear canal and external ear normal.     Nose: Nose normal. No congestion or rhinorrhea.     Mouth/Throat:     Pharynx: No oropharyngeal exudate or posterior oropharyngeal erythema.  Eyes:     General: No scleral icterus.       Right eye: No discharge.        Left eye: No discharge.  Neck:     Thyroid: No thyromegaly.     Vascular: No carotid bruit or JVD.     Trachea: No tracheal deviation.  Cardiovascular:     Rate and Rhythm: Normal rate and regular rhythm.     Heart sounds: Normal heart sounds. No murmur heard.   No friction rub. No gallop.  Pulmonary:     Effort: Pulmonary effort is normal.     Breath sounds: Normal breath sounds. No wheezing, rhonchi or rales.  Chest:     Chest wall: No tenderness.  Abdominal:     General: Bowel sounds are normal. There is no distension.     Palpations: Abdomen is soft. There is no mass.     Tenderness: There is no abdominal tenderness. There  is no guarding or rebound.  Musculoskeletal:     Cervical back: Neck supple.     Lumbar back: Spasms and tenderness present. Decreased range of motion.       Back:     Right hip:  No tenderness or bony tenderness. Normal range of motion.     Left hip: No tenderness. Normal range of motion.     Right lower leg: Edema present.     Left lower leg: Edema present.  Lymphadenopathy:     Cervical: No cervical adenopathy.  Skin:    Coloration: Skin is not jaundiced or pale.     Findings: No bruising, erythema, lesion or rash.  Neurological:     Mental Status: He is alert and oriented to person, place, and time.     Cranial Nerves: No cranial nerve deficit.     Motor: No weakness or abnormal muscle tone.     Coordination: Coordination normal.     Deep Tendon Reflexes: Reflexes normal.  Psychiatric:        Behavior: Behavior normal.        Thought Content: Thought content normal.        Judgment: Judgment normal.          Assessment & Plan:  Chronic obstructive pulmonary disease, unspecified COPD type ([place]) - Plan: CBC with Differential/Platelet, Lipid panel, COMPLETE METABOLIC PANEL WITH GFR  Polymyalgia rheumatica ([place]) - Plan: CBC with Differential/Platelet, Lipid panel, COMPLETE METABOLIC PANEL WITH GFR  Chronic congestive heart failure, unspecified heart failure type ([place]) - Plan: CBC with Differential/Platelet, Lipid panel, COMPLETE METABOLIC PANEL WITH GFR  Acute midline low back pain without sciatica - Plan: Urinalysis, Routine w reflex microscopic  Generalized osteoarthritis  General medical exam I believe the pain in his back is musculoskeletal.  I will give the patient Percocet to be used as needed for pain as he is unable to take NSAIDs due to Eliquis.  I will check a urinalysis but I do not feel that this is a UTI or kidney stone.  Anticipate gradual improvement over the next 2 weeks.  I will check a CBC, CMP, lipid panel.  Goal LDL cholesterol is less than 100.  Strongly recommended a COVID booster.  Patient does not require colonoscopy.  He does not require a PSA.  The remainder of his immunizations are up-to-date.  He denies any falls or memory loss  or depression.  I recommended increasing Lasix to 40 mg a day up to 20 mg a day for the next 3 days due to fluid retention and then decreasing his dose back to 20 mg a day which is his baseline dose

## 2021-06-12 LAB — URINALYSIS, ROUTINE W REFLEX MICROSCOPIC
Bilirubin Urine: NEGATIVE
Glucose, UA: NEGATIVE
Hgb urine dipstick: NEGATIVE
Ketones, ur: NEGATIVE
Leukocytes,Ua: NEGATIVE
Nitrite: NEGATIVE
Protein, ur: NEGATIVE
Specific Gravity, Urine: 1.02 (ref 1.001–1.035)
pH: 5.5 (ref 5.0–8.0)

## 2021-06-16 ENCOUNTER — Other Ambulatory Visit: Payer: Self-pay

## 2021-06-16 ENCOUNTER — Encounter: Payer: Self-pay | Admitting: Family Medicine

## 2021-06-16 ENCOUNTER — Other Ambulatory Visit (HOSPITAL_COMMUNITY): Payer: Self-pay

## 2021-06-16 ENCOUNTER — Ambulatory Visit (INDEPENDENT_AMBULATORY_CARE_PROVIDER_SITE_OTHER): Payer: PPO | Admitting: Family Medicine

## 2021-06-16 VITALS — BP 112/68 | HR 86 | Temp 97.3°F | Resp 18 | Ht 71.0 in | Wt 312.0 lb

## 2021-06-16 DIAGNOSIS — M5136 Other intervertebral disc degeneration, lumbar region: Secondary | ICD-10-CM

## 2021-06-16 MED ORDER — PREDNISONE 20 MG PO TABS
ORAL_TABLET | ORAL | 0 refills | Status: AC
  Filled 2021-06-16: qty 12, 6d supply, fill #0

## 2021-06-16 NOTE — Progress Notes (Signed)
Wt Readings from Last 3 Encounters:  06/16/21 (!) 312 lb (141.5 kg)  06/11/21 (!) 312 lb (141.5 kg)  04/24/21 (!) 311 lb 6.4 oz (141.3 kg)     Subjective:    Patient ID: Garrett Munoz, male    DOB: 11-Oct-1945, 76 y.o.   MRN: [id number] Patient presents today complaining of back pain.  The last imaging we have of his back is an MRI from 2019.  Please see the results below:  L1-L2: Minimal disc bulge and endplate spurring. Mild facet hypertrophy. No stenosis.   L2-L3: Disc desiccation. Mild mostly far lateral disc bulging. Mild facet and ligament flavum hypertrophy. Mild endplate spurring. Borderline to mild L2 foraminal stenosis has not significantly changed.   L3-L4: Disc desiccation. Endplate spurring. Increased circumferential disc bulge with broad-based posterior component. Mild to moderate facet and ligament flavum hypertrophy. Chronic trace left facet joint fluid. Increased flattening of the ventral thecal sac but no spinal or lateral recess stenosis. Mild bilateral L3 foraminal stenosis has not significantly changed.   L4-L5: Chronic disc desiccation. The right paracentral disc herniations seen in 2011 does not persist. There is a small residual component of left paracentral disc protrusion best seen on series 3, image 8 increased mild circumferential disc bulge. Mild to moderate facet and ligament flavum hypertrophy. Improved thecal sac and right lateral recess patency. Mild spinal stenosis. Mild to moderate left and moderate right L4 neural foraminal stenosis has increased.   L5-S1: Chronic disc desiccation, and evidence now of ankylosis through the disc space. Bulky but mostly anterior and far lateral endplate spurring. Broad-based posterior component is stable since 2011. Mild facet hypertrophy. No spinal or lateral recess stenosis. Stable mild to moderate right L5 foraminal stenosis.    Patient underwent discectomy of L4-L5 in 2019.  This was performed by Dr.  [name].  He request a referral back to see Dr. Nelva Bush for cortisone injections in the back.  About a month ago, he developed pain in the center of his back roughly at the level of L4 and L5.  He denies any numbness or tingling radiating into his legs.  He denies any paresthesias or radiculopathy.  He denies any leg weakness or saddle anesthesia.  Sharp pain.  It is worse when he lays down.  He is having to sleep sitting in a chair.  He denies any dysuria or hematuria.  Recent urinalysis was unremarkable.  He denies any fevers or chills.  Patient has no tenderness to palpation over the spinous processes in the lumbar spine.  He does have some mild tenderness to palpation of the paraspinal muscles.   Past Medical History:  Diagnosis Date   Atrial fibrillation ([place])    Bronchitis    hx   Cataract    right eye   CHF NYHA class III, acute on chronic, diastolic (HCC)    COPD (chronic obstructive pulmonary disease) (HCC)    DDD (degenerative disc disease), lumbosacral    Dysrhythmia    irregular heatbeat   Eye worm    right eye   Gout    History of kidney stones    Hypertension    Left knee DJD    Morbid obesity with BMI of 40.0-44.9, adult (HCC)    Pneumonia    hx   Prediabetes    Radicular syndrome of left leg    S/P total knee replacement    right   Sleep apnea    can't use sleep study 10 yrs ago   Stones  in the urinary tract    Past Surgical History:  Procedure Laterality Date   CARDIOVERSION N/A 03/04/2015   Procedure: CARDIOVERSION;  Surgeon: [name], MD;  Location: [place];  Service: Cardiovascular;  Laterality: N/A;   CHOLECYSTECTOMY     COLONOSCOPY N/A 01/01/2016   Procedure: COLONOSCOPY;  Surgeon: [name], MD;  [facility];  Service: Endoscopy;  Laterality: N/A;  1:45   COLONOSCOPY N/A 01/14/2017   Procedure: COLONOSCOPY;  Surgeon: [name], MD;  [facility];  Service: Endoscopy;  Laterality: N/A;  12:05   EYE EXAMINATION  UNDER ANESTHESIA W/ RETINAL CRYOTHERAPY AND RETINAL LASER  07/2011   EYE SURGERY  02/2011   cat rt   KNEE ARTHROSCOPY     bilateral   LUMBAR LAMINECTOMY/DECOMPRESSION MICRODISCECTOMY N/A 08/18/2017   Procedure: Bilateral microlumbar decompression Lumbar four-Lumbar five;  Surgeon: [name], MD;  Location: [facility];  Service: Orthopedics;  Laterality: N/A;   POLYPECTOMY  01/01/2016   Procedure: POLYPECTOMY;  Surgeon: [name], MD;  [facility];  Service: Endoscopy;;  colon   RETINAL DETACHMENT SURGERY  03/2011   rt   TOTAL KNEE ARTHROPLASTY  2003   rt   TOTAL KNEE ARTHROPLASTY  02/07/2012   Procedure: TOTAL KNEE ARTHROPLASTY;  Surgeon: [name], MD;  Location: [facility];  Service: Orthopedics;  Laterality: Left;   Current Outpatient Medications on File Prior to Visit  Medication Sig Dispense Refill   albuterol (ACCUNEB) 1.25 MG/3ML nebulizer solution Take 1 ampule by nebulization every 4 (four) hours as needed for wheezing.     albuterol (VENTOLIN HFA) 108 (90 Base) MCG/ACT inhaler Inhale 2 puffs into the lungs every 6 (six) hours as needed for wheezing or shortness of breath. 8.5 g 0   apixaban (ELIQUIS) 5 MG TABS tablet Take 1 tablet (5 mg total) by mouth 2 (two) times daily. 180 tablet 1   carvedilol (COREG) 12.5 MG tablet Take 1.5 tablets (18.75 mg total) by mouth every morning and 1 tablet (12.5 mg) in the evening. 135 tablet 3   clotrimazole-betamethasone (LOTRISONE) cream Apply 1 application topically 2 (two) times daily. 30 g 0   fluticasone (FLONASE) 50 MCG/ACT nasal spray SHAKE LIQUID AND USE 2 SPRAYS IN EACH NOSTRIL DAILY 16 g 6   furosemide (LASIX) 20 MG tablet Take 2 tablets (40 mg total) by mouth daily for 3 days. Then change to 2 tablets (40 mg total) daily alternating with 1 tablet (20 mg total) daily. 50 tablet 3   losartan (COZAAR) 25 MG tablet TAKE 1 TABLET (25 MG TOTAL) BY MOUTH DAILY. 90 tablet 3   magic mouthwash w/lidocaine SOLN Swish and spit 33mL  PO TID x5 days, then PRN- mouth pain. Steroid, Benadryl, Nystatin, Lidocaine 1:1 ratio. 100 mL 0   ondansetron (ZOFRAN) 4 MG tablet Take 1 tablet (4 mg total) by mouth every 8 (eight) hours as needed for nausea or vomiting. 20 tablet 0   oxyCODONE-acetaminophen (PERCOCET) 7.5-325 MG tablet Take 1 tablet by mouth every 4 (four) hours as needed for severe pain. 30 tablet 0   predniSONE (DELTASONE) 10 MG tablet Take 1 tablet (10 mg total) by mouth daily with breakfast. 90 tablet 3   temazepam (RESTORIL) 30 MG capsule Take 1 capsule (30 mg total) by mouth at bedtime as needed for sleep 30 capsule 2   gabapentin (NEURONTIN) 300 MG capsule TAKE 2 CAPSULES (600 MG TOTAL) BY MOUTH 3 (THREE) TIMES DAILY. 540 capsule  3   No current facility-administered medications on file prior to visit.   No Known Allergies Social History   Socioeconomic History   Marital status: Married    Spouse name: Not on file   Number of children: Not on file   Years of education: Not on file   Highest education level: Not on file  Occupational History   Not on file  Tobacco Use   Smoking status: Former    Packs/day: 2.00    Years: 15.00    Pack years: 30.00    Types: Cigarettes    Quit date: 02/02/1992    Years since quitting: 29.3   Smokeless tobacco: Never   Tobacco comments:    occ alcohol  Vaping Use   Vaping Use: Never used  Substance and Sexual Activity   Alcohol use: Yes    Alcohol/week: 0.0 standard drinks    Comment: occasional   Drug use: No   Sexual activity: Not on file  Other Topics Concern   Not on file  Social History Narrative   Not on file   Social Determinants of Health   Financial Resource Strain: Not on file  Food Insecurity: Not on file  Transportation Needs: Not on file  Physical Activity: Not on file  Stress: Not on file  Social Connections: Not on file  Intimate Partner Violence: Not on file      Review of Systems  All other systems reviewed and are negative.      Objective:   Physical Exam Vitals reviewed.  Constitutional:      General: He is not in acute distress.    Appearance: He is well-developed. He is not diaphoretic.  Neck:     Thyroid: No thyromegaly.     Vascular: No JVD.     Trachea: No tracheal deviation.  Cardiovascular:     Rate and Rhythm: Normal rate. Rhythm irregularly irregular.     Heart sounds: Normal heart sounds.  Pulmonary:     Effort: Pulmonary effort is normal.     Breath sounds: Decreased air movement present. Examination of the right-upper field reveals decreased breath sounds. Examination of the left-upper field reveals decreased breath sounds. Examination of the right-middle field reveals decreased breath sounds. Examination of the left-middle field reveals decreased breath sounds. Examination of the right-lower field reveals decreased breath sounds. Examination of the left-lower field reveals decreased breath sounds. Decreased breath sounds present. No wheezing, rhonchi or rales.  Chest:     Chest wall: No tenderness.  Abdominal:     Tenderness: There is no rebound.  Musculoskeletal:     Cervical back: Neck supple.     Lumbar back: Tenderness present. No spasms or bony tenderness. Decreased range of motion.       Back:     Right hip: No tenderness or bony tenderness. Normal range of motion.     Left hip: No tenderness. Normal range of motion.     Right lower leg: No edema.     Left lower leg: No edema.  Lymphadenopathy:     Cervical: No cervical adenopathy.  Skin:    Coloration: Skin is not pale.     Findings: No erythema or rash.  Neurological:     Mental Status: He is alert and oriented to person, place, and time.     Cranial Nerves: No cranial nerve deficit.     Motor: No abnormal muscle tone.     Coordination: Coordination normal.     Deep Tendon Reflexes: Reflexes  normal.  Psychiatric:        Behavior: Behavior normal.        Thought Content: Thought content normal.        Judgment: Judgment  normal.          Assessment & Plan:  DDD (degenerative disc disease), lumbar - Plan: Ambulatory referral to Orthopedic Surgery I suspect the majority of his pain is due to facet arthropathy in the lumbar spine.  I suspect the most significant level is L3-L4 based on the MRI results.  I will gladly consult orthopedics as I believe he may benefit from a facet joint injection at L3-L4.  However I will also temporarily start the patient on a higher dose of prednisone.  He is currently on 10 mg a day for PMR.  I will temporarily give the patient a burst for the next week.  He will take 60 mg a day for 2 days, 40 mg a day for 2 days, then 20 mg a day for 3 days and then stop.  He can then resume the 10 mg that he previously takes.  I will defer an x-ray to orthopedics as I anticipate that they will order one regardless and I do not want the patient to be charged for 2 separate x-rays

## 2021-06-17 ENCOUNTER — Other Ambulatory Visit (HOSPITAL_COMMUNITY): Payer: Self-pay

## 2021-06-29 ENCOUNTER — Other Ambulatory Visit (HOSPITAL_COMMUNITY): Payer: Self-pay

## 2021-06-29 ENCOUNTER — Other Ambulatory Visit: Payer: Self-pay | Admitting: Family Medicine

## 2021-06-30 ENCOUNTER — Other Ambulatory Visit (HOSPITAL_COMMUNITY): Payer: Self-pay

## 2021-06-30 DIAGNOSIS — M5416 Radiculopathy, lumbar region: Secondary | ICD-10-CM | POA: Diagnosis not present

## 2021-06-30 MED ORDER — LOSARTAN POTASSIUM 25 MG PO TABS
25.0000 mg | ORAL_TABLET | Freq: Every day | ORAL | 3 refills | Status: AC
  Filled 2021-06-30: qty 90, 90d supply, fill #0
  Filled 2021-09-28: qty 90, 90d supply, fill #1

## 2021-06-30 MED ORDER — TEMAZEPAM 30 MG PO CAPS
30.0000 mg | ORAL_CAPSULE | Freq: Every evening | ORAL | 2 refills | Status: DC | PRN
Start: 2021-06-30 — End: 2021-10-27
  Filled 2021-06-30 (×2): qty 30, 30d supply, fill #0
  Filled 2021-08-28: qty 30, 30d supply, fill #1
  Filled 2021-09-28: qty 30, 30d supply, fill #2

## 2021-06-30 NOTE — Telephone Encounter (Signed)
LOV 06/16/21 ?Last refill 04/02/21, #30, 0 refills ? ?Please review, thanks! ? ?

## 2021-07-16 ENCOUNTER — Telehealth: Payer: Self-pay

## 2021-07-16 NOTE — Telephone Encounter (Signed)
Pt's wife called wanted to know if pt could increase his Prednisone up to '20mg'$  for a few days due pains.  ? ?Please advise ?

## 2021-07-17 NOTE — Telephone Encounter (Signed)
Spoke with pt. Per pt understands and voiced that he only takes it as needed ?

## 2021-07-29 ENCOUNTER — Telehealth: Payer: Self-pay

## 2021-07-29 NOTE — Telephone Encounter (Signed)
Pt's wife called today stated that pt is in severe pain throughout his body. Wife also stated that his stomach looks swollen and feels really tight and painful. Wife stated that pt c/o feeling cold as well, per wife, took pt temp. Was not running a temp. O2-94% and b/p was at 118/64. Wife also stated that he did take his pain meds. As well.  ? ?Wife suggested for pt to go to Urgent Care however pt refuse to. After speaking with wife, I advice her to tell pt that I would encourage him to go the Urgent Care so that they can check him out, especially his stomach. Wife agrees, and will try again to encourage him to go. Wife will keep up-dated.  ? ? ?

## 2021-08-03 ENCOUNTER — Ambulatory Visit
Admission: RE | Admit: 2021-08-03 | Discharge: 2021-08-03 | Disposition: A | Discharge status: Home / Self Care | Payer: PPO | Source: Ambulatory Visit | Attending: Family Medicine | Admitting: Family Medicine

## 2021-08-03 ENCOUNTER — Ambulatory Visit (INDEPENDENT_AMBULATORY_CARE_PROVIDER_SITE_OTHER): Payer: PPO | Admitting: Family Medicine

## 2021-08-03 ENCOUNTER — Other Ambulatory Visit (HOSPITAL_COMMUNITY): Payer: Self-pay

## 2021-08-03 VITALS — BP 118/78 | HR 67 | Temp 97.3°F | Ht 71.0 in | Wt 290.6 lb

## 2021-08-03 DIAGNOSIS — J9811 Atelectasis: Secondary | ICD-10-CM | POA: Diagnosis not present

## 2021-08-03 DIAGNOSIS — R051 Acute cough: Secondary | ICD-10-CM | POA: Diagnosis not present

## 2021-08-03 DIAGNOSIS — M545 Low back pain, unspecified: Secondary | ICD-10-CM

## 2021-08-03 DIAGNOSIS — R634 Abnormal weight loss: Secondary | ICD-10-CM | POA: Diagnosis not present

## 2021-08-03 DIAGNOSIS — J189 Pneumonia, unspecified organism: Secondary | ICD-10-CM | POA: Diagnosis not present

## 2021-08-03 MED ORDER — AMOXICILLIN-POT CLAVULANATE 875-125 MG PO TABS
2.0000 | ORAL_TABLET | Freq: Two times a day (BID) | ORAL | 0 refills | Status: DC
  Filled 2021-08-03: qty 40, 10d supply, fill #0

## 2021-08-03 MED ORDER — AMOXICILLIN-POT CLAVULANATE ER 1000-62.5 MG PO TB12
2.0000 | ORAL_TABLET | Freq: Two times a day (BID) | ORAL | 0 refills | Status: DC
  Filled 2021-08-03: qty 40, 10d supply, fill #0

## 2021-08-03 NOTE — Progress Notes (Signed)
Wt Readings from Last 3 Encounters:  ?08/03/21 290 lb 9.6 oz (131.8 kg)  ?06/16/21 (!) 312 lb (141.5 kg)  ?06/11/21 (!) 312 lb (141.5 kg)  ? ? ? ?Subjective:  ? ? Patient ID: Garrett Munoz, male    DOB: 1945-10-23, 76 y.o.   MRN: [id number] ?06/16/21 ?Patient presents today complaining of back pain.  The last imaging we have of his back is an MRI from 2019.  Please see the results below: ? ?L1-L2: Minimal disc bulge and endplate spurring. Mild facet ?hypertrophy. No stenosis. ?  ?L2-L3: Disc desiccation. Mild mostly far lateral disc bulging. Mild ?facet and ligament flavum hypertrophy. Mild endplate spurring. ?Borderline to mild L2 foraminal stenosis has not significantly ?changed. ?  ?L3-L4: Disc desiccation. Endplate spurring. Increased ?circumferential disc bulge with broad-based posterior component. ?Mild to moderate facet and ligament flavum hypertrophy. Chronic ?trace left facet joint fluid. Increased flattening of the ventral ?thecal sac but no spinal or lateral recess stenosis. Mild bilateral ?L3 foraminal stenosis has not significantly changed. ?  ?L4-L5: Chronic disc desiccation. The right paracentral disc ?herniations seen in 2011 does not persist. There is a small residual ?component of left paracentral disc protrusion best seen on series 3, ?image 8 increased mild circumferential disc bulge. Mild to moderate ?facet and ligament flavum hypertrophy. Improved thecal sac and right ?lateral recess patency. Mild spinal stenosis. Mild to moderate left ?and moderate right L4 neural foraminal stenosis has increased. ?  ?L5-S1: Chronic disc desiccation, and evidence now of ankylosis ?through the disc space. Bulky but mostly anterior and far lateral ?endplate spurring. Broad-based posterior component is stable since ?2011. Mild facet hypertrophy. No spinal or lateral recess stenosis. ?Stable mild to moderate right L5 foraminal stenosis. ?  ? ?Patient underwent discectomy of L4-L5 in 2019.  This was performed by  Dr. [name].  He request a referral back to see Dr. Nelva Bush for cortisone injections in the back.  About a month ago, he developed pain in the center of his back roughly at the level of L4 and L5.  He denies any numbness or tingling radiating into his legs.  He denies any paresthesias or radiculopathy.  He denies any leg weakness or saddle anesthesia.  Sharp pain.  It is worse when he lays down.  He is having to sleep sitting in a chair.  He denies any dysuria or hematuria.  Recent urinalysis was unremarkable.  He denies any fevers or chills.  Patient has no tenderness to palpation over the spinous processes in the lumbar spine.  He does have some mild tenderness to palpation of the paraspinal muscles.  At that time, my plan was: ? ?I suspect the majority of his pain is due to facet arthropathy in the lumbar spine.  I suspect the most significant level is L3-L4 based on the MRI results.  I will gladly consult orthopedics as I believe he may benefit from a facet joint injection at L3-L4.  However I will also temporarily start the patient on a higher dose of prednisone.  He is currently on 10 mg a day for PMR.  I will temporarily give the patient a burst for the next week.  He will take 60 mg a day for 2 days, 40 mg a day for 2 days, then 20 mg a day for 3 days and then stop.  He can then resume the 10 mg that he previously takes.  I will defer an x-ray to orthopedics as I anticipate that they will order one  regardless and I do not want the patient to be charged for 2 separate x-rays ?  ? ?08/03/21 ?February, the patient has lost 22 pounds!  This is unintentional.  He reports diffuse pain all over his body however specifically he reports pain over the lateral hips bilaterally.  He describes pain when he tries to lay on his side at night on either side.  He also reports chronic pain in his lower back.  He saw Dr. Nelva Bush who referred him back to his back surgeon without performing a cortisone injection.  He states that he is  getting a worsening cough.  He reports occasionally bloody and purulent sputum over the last week.  He reports shortness of breath.  However on his exam today, he has some diminished breath sounds bilaterally but there are no other concerning physical exam findings.  Therefore I have an afebrile patient with purulent and bloody sputum who is lost 22 pounds who reports diffuse pain all over his body.  In January I did a chest x-ray that showed an opacity in the right lung.  This has not had a follow-up x-ray ?Past Medical History:  ?Diagnosis Date  ? Atrial fibrillation ([place])   ? Bronchitis   ? hx  ? Cataract   ? right eye  ? CHF NYHA class III, acute on chronic, diastolic (HCC)   ? COPD (chronic obstructive pulmonary disease) ([place])   ? DDD (degenerative disc disease), lumbosacral   ? Dysrhythmia   ? irregular heatbeat  ? Eye worm   ? right eye  ? Gout   ? History of kidney stones   ? Hypertension   ? Left knee DJD   ? Morbid obesity with BMI of 40.0-44.9, adult ([place])   ? Pneumonia   ? hx  ? Prediabetes   ? Radicular syndrome of left leg   ? 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 Sleep apnea   ? can't use sleep study 10 yrs ago  ? 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   ? ?Past Surgical History:  ?Procedure Laterality Date  ? CARDIOVERSION N/A 03/04/2015  ? Procedure: CARDIOVERSION;  Surgeon: [name], MD;  Location: [place];  Service: Cardiovascular;  Laterality: N/A;  ? CHOLECYSTECTOMY    ? COLONOSCOPY N/A 01/01/2016  ? Procedure: COLONOSCOPY;  Surgeon: [name], MD;  [facility];  Service: Endoscopy;  Laterality: N/A;  1:45  ? COLONOSCOPY N/A 01/14/2017  ? Procedure: COLONOSCOPY;  Surgeon: [name], MD;  [facility];  Service: Endoscopy;  Laterality: N/A;  12:05  ? EYE EXAMINATION UNDER ANESTHESIA W/ RETINAL CRYOTHERAPY AND RETINAL LASER  07/2011  ? EYE SURGERY  02/2011  ? cat rt  ? KNEE ARTHROSCOPY    ? bilateral  ? LUMBAR LAMINECTOMY/DECOMPRESSION MICRODISCECTOMY N/A  08/18/2017  ? Procedure: Bilateral microlumbar decompression Lumbar four-Lumbar five;  Surgeon: [name], MD;  Location: [place];  Service: Orthopedics;  Laterality: N/A;  ? POLYPECTOMY  01/01/2016  ? Procedure: POLYPECTOMY;  Surgeon: [name], MD;  [facility];  Service: Endoscopy;;  colon  ? RETINAL DETACHMENT SURGERY  03/2011  ? rt  ? TOTAL KNEE ARTHROPLASTY  2003  ? rt  ? TOTAL KNEE ARTHROPLASTY  02/07/2012  ? Procedure: TOTAL KNEE ARTHROPLASTY;  Surgeon: [name], MD;  Location: [place];  Service: Orthopedics;  Laterality: Left;  ? ?Current Outpatient Medications on File Prior to Visit  ?Medication Sig Dispense Refill  ?  albuterol (ACCUNEB) 1.25 MG/3ML nebulizer solution Take 1 ampule by nebulization every 4 (four) hours as needed for wheezing.    ? albuterol (VENTOLIN HFA) 108 (90 Base) MCG/ACT inhaler Inhale 2 puffs into the lungs every 6 (six) hours as needed for wheezing or shortness of breath. 8.5 g 0  ? apixaban (ELIQUIS) 5 MG TABS tablet Take 1 tablet (5 mg total) by mouth 2 (two) times daily. 180 tablet 1  ? carvedilol (COREG) 12.5 MG tablet Take 1.5 tablets (18.75 mg total) by mouth every morning and 1 tablet (12.5 mg) in the evening. 135 tablet 3  ? clotrimazole-betamethasone (LOTRISONE) cream Apply 1 application topically 2 (two) times daily. 30 g 0  ? fluticasone (FLONASE) 50 MCG/ACT nasal spray SHAKE LIQUID AND USE 2 SPRAYS IN EACH NOSTRIL DAILY 16 g 6  ? furosemide (LASIX) 20 MG tablet Take 2 tablets (40 mg total) by mouth daily for 3 days. Then change to 2 tablets (40 mg total) daily alternating with 1 tablet (20 mg total) daily. 50 tablet 3  ? gabapentin (NEURONTIN) 300 MG capsule TAKE 2 CAPSULES (600 MG TOTAL) BY MOUTH 3 (THREE) TIMES DAILY. 540 capsule 3  ? losartan (COZAAR) 25 MG tablet Take 1 tablet (25 mg total) by mouth daily. 90 tablet 3  ? magic mouthwash w/lidocaine SOLN Swish and spit 45m PO TID x5 days, then PRN- mouth pain. Steroid, Benadryl, Nystatin,  Lidocaine 1:1 ratio. 100 mL 0  ? ondansetron (ZOFRAN) 4 MG tablet Take 1 tablet (4 mg total) by mouth every 8 (eight) hours as needed for nausea or vomiting. 20 tablet 0  ? oxyCODONE-acetaminophen (PERCOCET) 7.5-325

## 2021-08-04 ENCOUNTER — Other Ambulatory Visit (HOSPITAL_COMMUNITY): Payer: Self-pay

## 2021-08-07 ENCOUNTER — Telehealth: Payer: Self-pay

## 2021-08-07 NOTE — Telephone Encounter (Signed)
Call pt and spoke with w/wife, per Dr. Dennard Schaumann Can use prednisone 40 mg poqday for 7 days  ? ?Wife voiced understanding.  ?

## 2021-08-07 NOTE — Telephone Encounter (Signed)
Per pt's wife, pt is taking antibiotics however don't see to be working, she also stated that his chest even hurts now from coughing.  ? ?Pt's wife wants to know if you can give something stronger.  ? ?

## 2021-08-07 NOTE — Telephone Encounter (Signed)
Pt's spouse called asking if it was possible for dr to send in something stronger for pt to take. Pt's wife states that he is not getting any better. Please advise ? ?Cb#: [phone] ?

## 2021-08-21 ENCOUNTER — Other Ambulatory Visit: Payer: Self-pay | Admitting: Family Medicine

## 2021-08-21 ENCOUNTER — Other Ambulatory Visit (HOSPITAL_COMMUNITY): Payer: Self-pay

## 2021-08-21 MED ORDER — OXYCODONE-ACETAMINOPHEN 7.5-325 MG PO TABS
1.0000 | ORAL_TABLET | ORAL | 0 refills | Status: DC | PRN
  Filled 2021-08-21: qty 30, 5d supply, fill #0

## 2021-08-28 ENCOUNTER — Other Ambulatory Visit (HOSPITAL_COMMUNITY): Payer: Self-pay

## 2021-08-31 ENCOUNTER — Other Ambulatory Visit (HOSPITAL_COMMUNITY): Payer: Self-pay

## 2021-09-03 ENCOUNTER — Other Ambulatory Visit (HOSPITAL_COMMUNITY): Payer: Self-pay

## 2021-09-03 ENCOUNTER — Ambulatory Visit (INDEPENDENT_AMBULATORY_CARE_PROVIDER_SITE_OTHER): Payer: PPO | Admitting: Family Medicine

## 2021-09-03 VITALS — BP 120/80 | HR 42 | Temp 97.7°F | Ht 71.0 in | Wt 312.0 lb

## 2021-09-03 DIAGNOSIS — M48061 Spinal stenosis, lumbar region without neurogenic claudication: Secondary | ICD-10-CM

## 2021-09-03 DIAGNOSIS — M5136 Other intervertebral disc degeneration, lumbar region: Secondary | ICD-10-CM | POA: Diagnosis not present

## 2021-09-03 DIAGNOSIS — M5416 Radiculopathy, lumbar region: Secondary | ICD-10-CM | POA: Diagnosis not present

## 2021-09-03 MED ORDER — OXYCODONE-ACETAMINOPHEN 10-325 MG PO TABS
1.0000 | ORAL_TABLET | Freq: Four times a day (QID) | ORAL | 0 refills | Status: DC | PRN
  Filled 2021-09-03: qty 47, 12d supply, fill #0
  Filled 2021-09-03: qty 43, 11d supply, fill #0

## 2021-09-03 NOTE — Progress Notes (Signed)
Subjective:    Patient ID: Garrett Munoz, male    DOB: May 15, 1945, 76 y.o.   MRN: [id number] Patient presents today with bilateral low back pain radiating into the posterior hips bilaterally.  He states that he cannot stand for more than 5 minutes without severe pain radiating into his posterior hips.  He has to lay down due to the severity of the pain.  He reports pain when he sleeps at night.  He has no tenderness to palpation over the greater trochanters bilaterally.  I am able to flex and externally rotate and internally rotate both hips without triggering any anterior hip pain or pain in the joint itself but he does complain of pain in his lower back with movement.  He denies any saddle anesthesia or bowel or bladder incontinence.  He denies any leg weakness.  However he does have a positive straight leg raise bilaterally today.  Patient has a history of a laminectomy due to nerve impingement several years ago.  He has not had an MRI since 2019.  However this low back pain has been going on now for several months and is steadily getting worse.  He states that he has been in pain for the last 4 years however over the last 6 months it is gotten unbearable.  He is requesting cortisone shot from his hip however this does not seem to be a hip related pathology  Past Medical History:  Diagnosis Date   Atrial fibrillation (HCC)    Bronchitis    hx   Cataract    right eye   CHF NYHA class III, acute on chronic, diastolic (HCC)    COPD (chronic obstructive pulmonary disease) (HCC)    DDD (degenerative disc disease), lumbosacral    Dysrhythmia    irregular heatbeat   Eye worm    right eye   Gout    History of kidney stones    Hypertension    Left knee DJD    Morbid obesity with BMI of 40.0-44.9, adult (HCC)    Pneumonia    hx   Prediabetes    Radicular syndrome of left leg    S/P total knee replacement    right   Sleep apnea    can't use sleep study 10 yrs ago   Stones in the  urinary tract    Past Surgical History:  Procedure Laterality Date   CARDIOVERSION N/A 03/04/2015   Procedure: CARDIOVERSION;  Surgeon: [name], MD;  Location: [place];  Service: Cardiovascular;  Laterality: N/A;   CHOLECYSTECTOMY     COLONOSCOPY N/A 01/01/2016   Procedure: COLONOSCOPY;  Surgeon: [name], MD;  [facility];  Service: Endoscopy;  Laterality: N/A;  1:45   COLONOSCOPY N/A 01/14/2017   Procedure: COLONOSCOPY;  Surgeon: [name], MD;  [facility];  Service: Endoscopy;  Laterality: N/A;  12:05   EYE EXAMINATION UNDER ANESTHESIA W/ RETINAL CRYOTHERAPY AND RETINAL LASER  07/2011   EYE SURGERY  02/2011   cat rt   KNEE ARTHROSCOPY     bilateral   LUMBAR LAMINECTOMY/DECOMPRESSION MICRODISCECTOMY N/A 08/18/2017   Procedure: Bilateral microlumbar decompression Lumbar four-Lumbar five;  Surgeon: [name], MD;  Location: [place];  Service: Orthopedics;  Laterality: N/A;   POLYPECTOMY  01/01/2016   Procedure: POLYPECTOMY;  Surgeon: [name], MD;  [facility];  Service: Endoscopy;;  colon   RETINAL DETACHMENT SURGERY  03/2011   rt   TOTAL  KNEE ARTHROPLASTY  2003   rt   TOTAL KNEE ARTHROPLASTY  02/07/2012   Procedure: TOTAL KNEE ARTHROPLASTY;  Surgeon: [name], MD;  Location: [place];  Service: Orthopedics;  Laterality: Left;   Current Outpatient Medications on File Prior to Visit  Medication Sig Dispense Refill   albuterol (ACCUNEB) 1.25 MG/3ML nebulizer solution Take 1 ampule by nebulization every 4 (four) hours as needed for wheezing.     albuterol (VENTOLIN HFA) 108 (90 Base) MCG/ACT inhaler Inhale 2 puffs into the lungs every 6 (six) hours as needed for wheezing or shortness of breath. 8.5 g 0   amoxicillin-clavulanate (AUGMENTIN XR) 1000-62.5 MG 12 hr tablet Take 2 tablets by mouth 2 (two) times daily. 40 tablet 0   apixaban (ELIQUIS) 5 MG TABS tablet Take 1 tablet (5 mg total) by mouth 2 (two) times daily.  180 tablet 1   carvedilol (COREG) 12.5 MG tablet Take 1.5 tablets (18.75 mg total) by mouth every morning and 1 tablet (12.5 mg) in the evening. 135 tablet 3   clotrimazole-betamethasone (LOTRISONE) cream Apply 1 application topically 2 (two) times daily. 30 g 0   fluticasone (FLONASE) 50 MCG/ACT nasal spray SHAKE LIQUID AND USE 2 SPRAYS IN EACH NOSTRIL DAILY 16 g 6   furosemide (LASIX) 20 MG tablet Take 2 tablets (40 mg total) by mouth daily for 3 days. Then change to 2 tablets (40 mg total) daily alternating with 1 tablet (20 mg total) daily. 50 tablet 3   gabapentin (NEURONTIN) 300 MG capsule TAKE 2 CAPSULES (600 MG TOTAL) BY MOUTH 3 (THREE) TIMES DAILY. 540 capsule 3   losartan (COZAAR) 25 MG tablet Take 1 tablet (25 mg total) by mouth daily. 90 tablet 3   magic mouthwash w/lidocaine SOLN Swish and spit 18m PO TID x5 days, then PRN- mouth pain. Steroid, Benadryl, Nystatin, Lidocaine 1:1 ratio. 100 mL 0   ondansetron (ZOFRAN) 4 MG tablet Take 1 tablet (4 mg total) by mouth every 8 (eight) hours as needed for nausea or vomiting. 20 tablet 0   oxyCODONE-acetaminophen (PERCOCET) 7.5-325 MG tablet Take 1 tablet by mouth every 4 (four) hours as needed for severe pain. 30 tablet 0   predniSONE (DELTASONE) 10 MG tablet Take 1 tablet (10 mg total) by mouth daily with breakfast. 90 tablet 3   temazepam (RESTORIL) 30 MG capsule Take 1 capsule (30 mg total) by mouth at bedtime as needed for sleep 30 capsule 2   No current facility-administered medications on file prior to visit.   No Known Allergies Social History   Socioeconomic History   Marital status: Married    Spouse name: Not on file   Number of children: Not on file   Years of education: Not on file   Highest education level: Not on file  Occupational History   Not on file  Tobacco Use   Smoking status: Former    Packs/day: 2.00    Years: 15.00    Pack years: 30.00    Types: Cigarettes    Quit date: 02/02/1992    Years since  quitting: 29.6   Smokeless tobacco: Never   Tobacco comments:    occ alcohol  Vaping Use   Vaping Use: Never used  Substance and Sexual Activity   Alcohol use: Yes    Alcohol/week: 0.0 standard drinks    Comment: occasional   Drug use: No   Sexual activity: Not on file  Other Topics Concern   Not on file  Social History Narrative  Not on file   Social Determinants of Health   Financial Resource Strain: Not on file  Food Insecurity: Not on file  Transportation Needs: Not on file  Physical Activity: Not on file  Stress: Not on file  Social Connections: Not on file  Intimate Partner Violence: Not on file      Review of Systems  All other systems reviewed and are negative.     Objective:   Physical Exam Vitals reviewed.  Constitutional:      General: He is not in acute distress.    Appearance: He is well-developed. He is not diaphoretic.  Neck:     Thyroid: No thyromegaly.     Vascular: No JVD.     Trachea: No tracheal deviation.  Cardiovascular:     Rate and Rhythm: Normal rate. Rhythm irregularly irregular.     Heart sounds: Normal heart sounds.  Pulmonary:     Effort: Pulmonary effort is normal.     Breath sounds: Decreased air movement present. Examination of the right-middle field reveals decreased breath sounds. Examination of the right-lower field reveals decreased breath sounds. Examination of the left-lower field reveals decreased breath sounds. Decreased breath sounds present. No wheezing, rhonchi or rales.  Chest:     Chest wall: No tenderness.  Musculoskeletal:     Cervical back: Neck supple.     Lumbar back: Tenderness present. No swelling, lacerations, spasms or bony tenderness. Decreased range of motion. Positive right straight leg raise test and positive left straight leg raise test.       Back:     Right hip: No tenderness or bony tenderness. Normal range of motion.     Left hip: No tenderness. Normal range of motion.     Right lower leg: No  edema.     Left lower leg: No edema.  Lymphadenopathy:     Cervical: No cervical adenopathy.  Skin:    Coloration: Skin is not pale.     Findings: No erythema or rash.  Neurological:     Mental Status: He is alert and oriented to person, place, and time.     Cranial Nerves: No cranial nerve deficit.     Motor: No abnormal muscle tone.     Coordination: Coordination normal.  Psychiatric:        Behavior: Behavior normal.        Thought Content: Thought content normal.        Judgment: Judgment normal.          Assessment & Plan:  DDD (degenerative disc disease), lumbar - Plan: MR Lumbar Spine Wo Contrast  Lumbar radiculopathy - Plan: MR Lumbar Spine Wo Contrast  Spinal stenosis of lumbar region, unspecified whether neurogenic claudication present - Plan: MR Lumbar Spine Wo Contrast Based on his inability to stand for more than 5 or 10 minutes, the bilateral posterior hip pain which I suspect is bilateral lumbar radiculopathy and the constant worsening low back pain, I suspect that the patient likely has lumbar spinal stenosis.  Proceed with an MRI of the lower back to evaluate further.  Meanwhile increase his Percocet strength to 10/325 1 p.o. every 6 hours as needed pain, number 90 tablets/month.

## 2021-09-22 ENCOUNTER — Other Ambulatory Visit (HOSPITAL_COMMUNITY): Payer: PPO

## 2021-09-28 ENCOUNTER — Other Ambulatory Visit: Payer: Self-pay | Admitting: Family Medicine

## 2021-09-29 ENCOUNTER — Other Ambulatory Visit: Payer: Self-pay | Admitting: Cardiovascular Disease

## 2021-09-29 ENCOUNTER — Other Ambulatory Visit (HOSPITAL_COMMUNITY): Payer: Self-pay

## 2021-09-29 MED ORDER — GABAPENTIN 300 MG PO CAPS
600.0000 mg | ORAL_CAPSULE | Freq: Three times a day (TID) | ORAL | 3 refills | Status: AC
  Filled 2021-09-29: qty 540, 90d supply, fill #0

## 2021-09-29 MED ORDER — FUROSEMIDE 20 MG PO TABS
20.0000 mg | ORAL_TABLET | Freq: Every day | ORAL | 6 refills | Status: DC
  Filled 2021-09-29 – 2021-09-30 (×2): qty 50, 50d supply, fill #0

## 2021-09-29 NOTE — Telephone Encounter (Signed)
Last filled 04/06/21 09/03/21 ov

## 2021-09-30 ENCOUNTER — Other Ambulatory Visit (HOSPITAL_COMMUNITY): Payer: Self-pay

## 2021-09-30 ENCOUNTER — Other Ambulatory Visit: Payer: Self-pay | Admitting: Family Medicine

## 2021-09-30 ENCOUNTER — Other Ambulatory Visit: Payer: Self-pay | Admitting: Cardiovascular Disease

## 2021-09-30 NOTE — Telephone Encounter (Signed)
Pt's wife called in to inquire about the status of these refills for pt. Please advise.  Cb#: [phone]

## 2021-10-01 ENCOUNTER — Telehealth: Payer: Self-pay

## 2021-10-01 ENCOUNTER — Telehealth: Payer: Self-pay | Admitting: Cardiovascular Disease

## 2021-10-01 ENCOUNTER — Other Ambulatory Visit (HOSPITAL_COMMUNITY): Payer: Self-pay

## 2021-10-01 ENCOUNTER — Other Ambulatory Visit: Payer: Self-pay | Admitting: Family Medicine

## 2021-10-01 ENCOUNTER — Other Ambulatory Visit: Payer: Self-pay

## 2021-10-01 MED ORDER — LEVOCETIRIZINE DIHYDROCHLORIDE 5 MG PO TABS
5.0000 mg | ORAL_TABLET | Freq: Every evening | ORAL | 0 refills | Status: DC
  Filled 2021-10-01: qty 30, 30d supply, fill #0

## 2021-10-01 MED ORDER — FUROSEMIDE 20 MG PO TABS
20.0000 mg | ORAL_TABLET | Freq: Every day | ORAL | 3 refills | Status: AC
  Filled 2021-10-01: qty 90, 90d supply, fill #0

## 2021-10-01 NOTE — Telephone Encounter (Signed)
Pt's wife states that pt has not been feeling well lately. She states that pt has been really tired and weak. Pt states that he thinks that it's something going on with his heart. They would like a call back. Please advise

## 2021-10-01 NOTE — Telephone Encounter (Signed)
Wife reports patient has been tired, weak, and with lack of energy for 2 months. This AM BP 128/84, P 78, sat 92%, in afib. Patient refuses to wear CPAP. Dr. Claiborne Billings advised. No new orders at this time. Patietn refuses to see APP. Scheduled patient with Dr. Claiborne Billings for 10/14/21. Recommended to wife that if patient's condition worsens, to take him to the ED. She voiced understanding.

## 2021-10-01 NOTE — Telephone Encounter (Signed)
Left message to call back  

## 2021-10-01 NOTE — Telephone Encounter (Signed)
Patient c/o Palpitations:  High priority if patient c/o lightheadedness, shortness of breath, or chest pain  How long have you had palpitations/irregular HR/ Afib? Are you having the symptoms now?  Few months now, afib.  Are you currently experiencing lightheadedness, SOB or CP? no  Do you have a history of afib (atrial fibrillation) or irregular heart rhythm? yes  Have you checked your BP or HR? (document readings if available):  POA has taken them, but did not document readings   Are you experiencing any other symptoms? Fatigue, diarrhea, and nausea   Pt c/o swelling: STAT is pt has developed SOB within 24 hours  If swelling, where is the swelling located? Ankles and feet  How much weight have you gained and in what time span? Same weight  Have you gained 3 pounds in a day or 5 pounds in a week? no  Do you have a log of your daily weights (if so, list)? no  Are you currently taking a fluid pill? Yes lasix '10mg'$  a day  Are you currently SOB? no  Have you traveled recently? No  Pt's POA did state that he has experience SOB just not at the moment.

## 2021-10-01 NOTE — Telephone Encounter (Signed)
Patient will see Dr.Kelly on 06/28

## 2021-10-01 NOTE — Telephone Encounter (Signed)
Pt's wife called and wanted to know if you can send Rx for Lasix for 1 tablet for 90 days?  Pt's wife stated that pt no longer has any fluids and doing well.   Please advice?

## 2021-10-02 ENCOUNTER — Telehealth: Payer: Self-pay

## 2021-10-02 NOTE — Telephone Encounter (Signed)
Pt's spouse brought in handicap placard form to be filled out by pcp. Please call pt's spouse when ppw available for pick up.  Cb#: [phone]

## 2021-10-02 NOTE — Telephone Encounter (Signed)
handicap placard form in Dr. Samella Parr folder

## 2021-10-06 NOTE — Telephone Encounter (Signed)
handicap placard form completed per Dr. Dennard Schaumann, sent to front desk to call pt for pick up 10/06/21

## 2021-10-12 ENCOUNTER — Ambulatory Visit (HOSPITAL_COMMUNITY)
Admission: RE | Admit: 2021-10-12 | Discharge: 2021-10-12 | Disposition: A | Discharge status: Home / Self Care | Payer: PPO | Source: Ambulatory Visit | Attending: Cardiovascular Disease | Admitting: Cardiovascular Disease

## 2021-10-12 DIAGNOSIS — I371 Nonrheumatic pulmonary valve insufficiency: Secondary | ICD-10-CM | POA: Diagnosis not present

## 2021-10-12 DIAGNOSIS — Z87891 Personal history of nicotine dependence: Secondary | ICD-10-CM | POA: Diagnosis not present

## 2021-10-12 DIAGNOSIS — I517 Cardiomegaly: Secondary | ICD-10-CM

## 2021-10-12 DIAGNOSIS — I509 Heart failure, unspecified: Secondary | ICD-10-CM | POA: Insufficient documentation

## 2021-10-12 DIAGNOSIS — I071 Rheumatic tricuspid insufficiency: Secondary | ICD-10-CM | POA: Diagnosis not present

## 2021-10-12 DIAGNOSIS — J449 Chronic obstructive pulmonary disease, unspecified: Secondary | ICD-10-CM | POA: Insufficient documentation

## 2021-10-12 DIAGNOSIS — I4891 Unspecified atrial fibrillation: Secondary | ICD-10-CM | POA: Diagnosis not present

## 2021-10-12 DIAGNOSIS — I110 Hypertensive heart disease with heart failure: Secondary | ICD-10-CM | POA: Insufficient documentation

## 2021-10-12 LAB — ECHOCARDIOGRAM COMPLETE
Area-P 1/2: 4.37 cm2
S' Lateral: 3.5 cm

## 2021-10-14 ENCOUNTER — Ambulatory Visit: Payer: PPO | Admitting: Cardiovascular Disease

## 2021-10-14 ENCOUNTER — Encounter: Payer: Self-pay | Admitting: Cardiovascular Disease

## 2021-10-14 DIAGNOSIS — I5032 Chronic diastolic (congestive) heart failure: Secondary | ICD-10-CM | POA: Diagnosis not present

## 2021-10-14 DIAGNOSIS — I517 Cardiomegaly: Secondary | ICD-10-CM

## 2021-10-14 DIAGNOSIS — I4891 Unspecified atrial fibrillation: Secondary | ICD-10-CM

## 2021-10-14 DIAGNOSIS — I4821 Permanent atrial fibrillation: Secondary | ICD-10-CM

## 2021-10-14 DIAGNOSIS — Z7901 Long term (current) use of anticoagulants: Secondary | ICD-10-CM | POA: Diagnosis not present

## 2021-10-14 DIAGNOSIS — G4733 Obstructive sleep apnea (adult) (pediatric): Secondary | ICD-10-CM | POA: Diagnosis not present

## 2021-10-14 DIAGNOSIS — I10 Essential (primary) hypertension: Secondary | ICD-10-CM | POA: Diagnosis not present

## 2021-10-14 NOTE — Patient Instructions (Signed)
Medication Instructions:  Your Physician recommend you continue on your current medication as directed.    *If you need a refill on your cardiac medications before your next appointment, please call your pharmacy*   Lab Work: None ordered today   Testing/Procedures: None ordered today   Follow-Up: At [facility], you and your health needs are our priority.  As part of our continuing mission to provide you with exceptional heart care, we have created designated Provider Care Teams.  These Care Teams include your primary Cardiologist (physician) and Advanced Practice Providers (APPs -  Physician Assistants and Nurse Practitioners) who all work together to provide you with the care you need, when you need it.  We recommend signing up for the patient portal called "MyChart".  Sign up information is provided on this After Visit Summary.  MyChart is used to connect with patients for Virtual Visits (Telemedicine).  Patients are able to view lab/test results, encounter notes, upcoming appointments, etc.  Non-urgent messages can be sent to your provider as well.   To learn more about what you can do with MyChart, go to [email or web address].    Your next appointment:   1 year(s)  The format for your next appointment:   In Person  Provider:   Thomas Kelly, MD       

## 2021-10-14 NOTE — Progress Notes (Signed)
Patient ID: Garrett Munoz, male   DOB: 06-22-1945, 76 y.o.   MRN: [id number]     HPI: Garrett Munoz is a 76 y.o. male who presents to the office today for a 5 month follow-up evaluation.  Mr. Garrett Munoz has a history of hypertension, and remotely had taken blood pressure medications for several years but none since the last 20 years. He has a history of prior hypertension, obstructive sleep apnea, untreated due to previous intolerance to full face mask, as well as obesity.  He admits to having an intermittent irregular rhythm.  He was scheduled to undergo elective surgery on his right foot hammertoe by Dr. Fritzi Mandes.  Surgery was canceled due to concerns for possible Mobitz type II block with possible atrial flutter.  He was seen by me for preoperative evaluation on 01/30/2015.  At that time, his ECG demonstrated atrial fibrillation with ventricular rate at approximately 100 bpm.  He was noted have small Q wave in lead 3.  He was started on Toprol 25 mg for 4 days and this was titrated up to 50 mg.  He also was started on eloquence 5 mg twice a day for anticoagulation.  A 2-D echo Doppler study on February 18 2015 revealed an ejection fraction of 55-60%.  There was moderate left ventricular hypertrophy.  The left atrium was moderately dilated.  A nuclear perfusion study done on 02/13/2015 was low risk without ischemia with only a mild apical defect, probably attenuation. He denies any chest pain.  He denies shortness of breath.  He is been unable to walk secondary to his toe abnormality.  He admits to daytime sleepiness and snoring.  He had not utilized CPAP therapy in years.  He is status post cataract surgery with lens implant.    He underwent cardioversion on March 04, 2015 and was successfully converted back to sinus rhythm.  On 04/10/2015 he was admitted with community-acquired pneumonia and was found to be back in atrial fibrillation.  He denies any episodes of chest pain. He has been on eliquis 5 mg  twice a day for anticoagulation , carvedilol 6.25 mg twice a day.  He is unaware of his rhythm being abnormal. He is not sleeping well but has not been using his CPAP. He feels fatigued.  When I saw him in March 2017 he continued to be in atrial fibrillation.  After much discussion, concerning another attempt at trying to convert into sinus rhythm versus staying in permanent atrial fibrillation he opted to stay in permanent atrial fibrillation.  He has continued to be on anticoagulation therapy.  I saw him in October 2018 he continued to have difficulty with low back pain as well as swelling in his hands due to arthritis.  He sees Dr. Dennard Schaumann for primary care.  He denied any recent episodes of chest pain.  He denies palpitations.  He has not been successful with weight loss and his BMI has increased up to 42.3.  Laboratory in April 2018 had shown glucose increased at 107.  Lipid studies revealed a cholesterol of 134, triglycerides 103, HDL 50, and LDL 63.    I saw him in November 2019.  At that time, he was not using CPAP.  He was not sleeping well and often would have to take naps.  He is atrial fibrillation rate was controlled on carvedilol 18.75 mg in the morning and 12.5 mg in the evening and he continued to be on Eliquis without bleeding.  During that evaluation we discussed  the possibility of reevaluating his sleep apnea.  I discussed new mask technology however he opted against doing this.  I saw him in November 2020 and over the prior year he denied any recurrent chest pain.  He was having difficulty with low back discomfort which was limiting his walking.  He also underwent carpal tunnel surgery of his right hand.  He admits to some weight gain.  He is not been exercising during this Covid 19 pandemic.  He had laboratory in May 2020.  TSH was 0.88.  Hemoglobin hematocrit were stable at 16.8 and 48.1.  Glucose was 99.  Renal function was normal with a BUN of 13 and a creatinine of 0.88.  LFTs were  normal.  Lipid studies in 2019 showed a total cholesterol 138 LDL cholesterol 70 HDL 53 and triglycerides 73.   I last saw him on May 26, 2020.  Since my prior evaluation he was hospitalized in November 2021 with CHF exacerbation.  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  He had not been using his CPAP and apparently in the hospital did not like BiPAP.  An echo Doppler study on March 09, 2020 showed an EF at 60 to 65%.  There was severe LVH.  Diastolic function could not be assessed.  He had mild to moderate left atrial dilatation.  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  Following his hospitalization he was evaluated by Dr. Halford Chessman and he has been scheduled for a subsequent home sleep study to reassess his previously diagnosed and untreated sleep apnea.  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 Vernelle Emerald.  He has been on methotrexate weekly.  During his evaluation with me, his blood pressure was elevated and with his diastolic dysfunction I recommended initiation of low-dose losartan at 25 mg daily.  I last saw him on April 24, 2021.  Since his prior evaluation he  had pneumonia on 3 instances secondary to aspiration.  He  was evaluated ENT and pulmonary was felt to have aspiration.  He admits to some increased shortness of breath and leg swelling.  He has been evaluated by Dr. [name] for his COPD and was treated with BiPAP therapy.  He has not been using this consistently.  He was on losartan 25 mg, carvedilol 18.75 mg in the morning and 12.5 mg in the evening and furosemide 20 mg daily.  With his leg edema I recommended for the next 3 days increase Lasix to 40 mg daily and then alternate 40 with 20 every other day depending upon his leg edema.  I recommended he continue to use BiPAP on a daily basis.  6 months I recommended a follow-up echo Doppler study  assessment of LV function.  He underwent an echo Doppler study on October 12, 2021 which showed his EF stable at 60 to 65% with mild concentric LVH.  Diastolic parameters were indeterminate.  There was mild dilatation of aortic root at 42 mm and of the ascending aorta at 41 mm.  Presently, he feels well.  His shortness of breath has improved.  He has not been very successful with weight loss.  He presents for follow-up evaluation.   Past Medical History:  Diagnosis Date   Atrial fibrillation ([place])    Bronchitis    hx   Cataract    right eye   CHF NYHA class  III, acute on chronic, diastolic (HCC)    COPD (chronic obstructive pulmonary disease) (HCC)    DDD (degenerative disc disease), lumbosacral    Dysrhythmia    irregular heatbeat   Eye worm    right eye   Gout    History of kidney stones    Hypertension    Left knee DJD    Morbid obesity with BMI of 40.0-44.9, adult (HCC)    Pneumonia    hx   Prediabetes    Radicular syndrome of left leg    S/P total knee replacement    right   Sleep apnea    can't use sleep study 10 yrs ago   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     Past Surgical History:  Procedure Laterality Date   CARDIOVERSION N/A 03/04/2015   Procedure: CARDIOVERSION;  Surgeon: [name], MD;  Location: [facility];  Service: Cardiovascular;  Laterality: N/A;   CHOLECYSTECTOMY     COLONOSCOPY N/A 01/01/2016   Procedure: COLONOSCOPY;  Surgeon: [name], MD;  [facility];  Service: Endoscopy;  Laterality: N/A;  1:45   COLONOSCOPY N/A 01/14/2017   Procedure: COLONOSCOPY;  Surgeon: [name], MD;  [facility];  Service: Endoscopy;  Laterality: N/A;  12:05   EYE EXAMINATION UNDER ANESTHESIA W/ RETINAL CRYOTHERAPY AND RETINAL LASER  07/2011   EYE SURGERY  02/2011   cat rt   KNEE ARTHROSCOPY     bilateral   LUMBAR LAMINECTOMY/DECOMPRESSION MICRODISCECTOMY N/A 08/18/2017   Procedure: Bilateral microlumbar decompression Lumbar four-Lumbar  five;  Surgeon: [name], MD;  Location: [facility];  Service: Orthopedics;  Laterality: N/A;   POLYPECTOMY  01/01/2016   Procedure: POLYPECTOMY;  Surgeon: [name], MD;  [facility];  Service: Endoscopy;;  colon   RETINAL DETACHMENT SURGERY  03/2011   rt   TOTAL KNEE ARTHROPLASTY  2003   rt   TOTAL KNEE ARTHROPLASTY  02/07/2012   Procedure: TOTAL KNEE ARTHROPLASTY;  Surgeon: [name], MD;  Location: [facility];  Service: Orthopedics;  Laterality: Left;    No Known Allergies  Current Outpatient Medications  Medication Sig Dispense Refill   albuterol (ACCUNEB) 1.25 MG/3ML nebulizer solution Take 1 ampule by nebulization every 4 (four) hours as needed for wheezing.     albuterol (VENTOLIN HFA) 108 (90 Base) MCG/ACT inhaler Inhale 2 puffs into the lungs every 6 (six) hours as needed for wheezing or shortness of breath. 8.5 g 0   allopurinol (ZYLOPRIM) 300 MG tablet Take 1 tablet by mouth daily.     apixaban (ELIQUIS) 5 MG TABS tablet Take 1 tablet (5 mg total) by mouth 2 (two) times daily. 180 tablet 1   carvedilol (COREG) 12.5 MG tablet Take 1.5 tablets (18.75 mg total) by mouth every morning and 1 tablet (12.5 mg) in the evening. 135 tablet 3   fluticasone (FLONASE) 50 MCG/ACT nasal spray SHAKE LIQUID AND USE 2 SPRAYS IN EACH NOSTRIL DAILY 16 g 6   furosemide (LASIX) 20 MG tablet Take 1 tablet (20 mg total) by mouth daily. 90 tablet 3   gabapentin (NEURONTIN) 300 MG capsule Take 2 capsules (600 mg total) by mouth 3 (three) times daily. 540 capsule 3   levocetirizine (XYZAL ALLERGY 24HR) 5 MG tablet Take 1 tablet (5 mg total) by mouth every evening. 30 tablet 0   losartan (COZAAR) 25 MG tablet Take 1 tablet (25 mg total) by mouth daily. 90 tablet 3   magic  mouthwash w/lidocaine SOLN Swish and spit 5m PO TID x5 days, then PRN- mouth pain. Steroid, Benadryl, Nystatin, Lidocaine 1:1 ratio. 100 mL 0   predniSONE (DELTASONE) 10 MG tablet Take 1 tablet (10 mg total) by mouth  daily with breakfast. 90 tablet 3   temazepam (RESTORIL) 30 MG capsule Take 1 capsule (30 mg total) by mouth at bedtime as needed for sleep 30 capsule 2   HYDROcodone-acetaminophen (NORCO) 10-325 MG tablet Take 1 tablet by mouth 3 (three) times daily as needed for pain 15 tablet 0   No current facility-administered medications for this visit.    Social History   Socioeconomic History   Marital status: Married    Spouse name: Not on file   Number of children: Not on file   Years of education: Not on file   Highest education level: Not on file  Occupational History   Not on file  Tobacco Use   Smoking status: Former    Packs/day: 2.00    Years: 15.00    Total pack years: 30.00    Types: Cigarettes    Quit date: 02/02/1992    Years since quitting: 29.7   Smokeless tobacco: Never   Tobacco comments:    occ alcohol  Vaping Use   Vaping Use: Never used  Substance and Sexual Activity   Alcohol use: Yes    Alcohol/week: 0.0 standard drinks of alcohol    Comment: occasional   Drug use: No   Sexual activity: Not on file  Other Topics Concern   Not on file  Social History Narrative   Not on file   Social Determinants of Health   Financial Resource Strain: Low Risk  (09/07/2019)   Overall Financial Resource Strain (CARDIA)    Difficulty of Paying Living Expenses: Not very hard  Food Insecurity: Not on file  Transportation Needs: Not on file  Physical Activity: Not on file  Stress: Not on file  Social Connections: Not on file  Intimate Partner Violence: Not on file   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  He is married.   Family History  Problem Relation Age of Onset   Lung cancer Father    Family history is notable that his father died at 89with lung CA and his mother died at 836  She had an irregular heart rhythm.  He has 2 sisters and one is deceased secondary to cancer and 1 brother.  ROS General: Negative; No  fevers, chills, or night sweats.  Positive for morbid obesity. HEENT: He has a right eye lens  implant after cataract surgery No changes in vision or hearing, sinus congestion, difficulty swallowing Pulmonary: Recent recurrent pneumonias felt to be contributed by aspiration Cardiovascular: See HPI:  GI: Negative; No nausea, vomiting, diarrhea, or abdominal pain GU: Negative; No dysuria, hematuria, or difficulty voiding Musculoskeletal: History of remote right total knee replacement and left total knee replacement.  Neck low back discomfort.  Status post right carpal tunnel surgery recent low back pain from lumbar spine; positive for polymyalgia rheumatica Hematologic: Negative; no easy bruising, bleeding Endocrine: Negative; no heat/cold intolerance; no diabetes, Neuro: Negative; no changes in balance, headaches Skin: Negative; No rashes or skin lesions Psychiatric: Negative; No behavioral problems, depression Sleep: Positive for sleep apnea; positive for snoring and hypersomnolence. Other comprehensive 14 point system review is negative   Physical Exam BP 108/64   Pulse 67   Ht _0  (1.803 m)  Wt (!) 311 lb 12.8 oz (141.4 kg)   SpO2 100%   BMI 43.49 kg/m    Repeat blood pressure by me was 112/68  Wt Readings from Last 3 Encounters:  10/14/21 (!) 311 lb 12.8 oz (141.4 kg)  09/03/21 (!) 312 lb (141.5 kg)  08/03/21 290 lb 9.6 oz (131.8 kg)    General: Alert, oriented, no distress.  Morbid obesity Skin: normal turgor, no rashes, warm and dry HEENT: Normocephalic, atraumatic. Pupils equal round and reactive to light; sclera anicteric; extraocular muscles intact;  Nose without nasal septal hypertrophy Mouth/Parynx benign; Mallinpatti scale 3 Neck: Thick neck; no JVD, no carotid bruits; normal carotid upstroke Lungs: clear to ausculatation and percussion; no wheezing or rales Chest wall: without tenderness to palpitation Heart: PMI not displaced, irregularly irregular consistent  with atrial fibrillation, s1 s2 normal, 1/6 systolic murmur, no diastolic murmur, no rubs, gallops, thrills, or heaves Abdomen: soft, nontender; no hepatosplenomehaly, BS+; abdominal aorta nontender and not dilated by palpation. Back: no CVA tenderness Pulses 2+ Musculoskeletal: full range of motion, normal strength, no joint deformities Extremities: Improvement in prior bilateral lower extremity edema, no clubbing cyanosis, Homan's sign negative  Neurologic: grossly nonfocal; Cranial nerves grossly wnl Psychologic: Normal mood and affect    October 14, 2021 ECG (independently read by me): Atrial fibrillation at 67; IRBBB  April 24, 2021 ECG (independently read by me): Atrial fibrillation at 69, IRBBB  May 26, 2020 ECG (independently read by me): Atrial fibrillation at 67, IRBBB; QTc 399 msec  November 2020 ECG (independently read by me): Atrial Fibrillation at 63  November 2019 ECG (independently read by me): Atrial fibrillation 81 bpm.  QTc interval 422 ms.  Early transition.  October 2018 ECG (independently read by me): Atrial fibrillation with ventricular rate at 79 bpm.  March 2018 ECG (independently read by me): Atrial fibrillation at 72 bpm with PVC.  September 2017 ECG (independently read by me): Atrial fibrillation with ventricular rate at 10 2 bpm.  Occasional unifocal PVCs versus a bare complex.  March 2017 ECG (independently read by me):  Atrial fibrillation at 83 bpm.  02/21/2015 ECG (independently read by me): Atrial fibrillation at 99 bpm.  01/30/2015 ECG (independently read by me): Atrial fibrillation at a approximately 90-100 bpm.  Small Q wave in lead 3.  Early transition.  LABS:     Latest Ref Rng & Units 06/11/2021    9:40 AM 01/12/2021    4:39 PM 01/08/2021    4:24 AM  BMP  Glucose 65 - 99 mg/dL 95  109  104   BUN 7 - 25 mg/dL _0 Creatinine 0.70 - 1.28 mg/dL 0.99  0.99  0.94   BUN/Creat Ratio 6 - 22 (calc) NOT APPLICABLE  NOT APPLICABLE     Sodium 025 - 146 mmol/L 142  140  138   Potassium 3.5 - 5.3 mmol/L 3.9  4.5  3.3   Chloride 98 - 110 mmol/L 105  93  88   CO2 20 - 32 mmol/L 29  37  43   Calcium 8.6 - 10.3 mg/dL 9.1  9.5  9.0        Latest Ref Rng & Units 06/11/2021    9:40 AM 01/12/2021    4:39 PM 01/05/2021    8:15 AM  Hepatic Function  Total Protein 6.1 - 8.1 g/dL 6.3  6.3  6.3   Albumin 3.5 - 5.0 g/dL   3.1   AST 10 - 35  U/L 15  20  33   ALT 9 - 46 U/L _0 Alk Phosphatase 38 - 126 U/L   51   Total Bilirubin 0.2 - 1.2 mg/dL 0.6  0.4  1.8        Latest Ref Rng & Units 06/11/2021    9:40 AM 01/12/2021    4:39 PM 01/07/2021    3:34 AM  CBC  WBC 3.8 - 10.8 Thousand/uL 10.2  9.0  8.7   Hemoglobin 13.2 - 17.1 g/dL 16.5  15.9  16.1   Hematocrit 38.5 - 50.0 % 49.3  46.7  48.5   Platelets 140 - 400 Thousand/uL 141  214  142    Lab Results  Component Value Date   MCV 95.2 06/11/2021   MCV 93.4 01/12/2021   MCV 97.6 01/07/2021    Lab Results  Component Value Date   TSH 0.978 10/16/2020    BNP    Component Value Date/Time   BNP 125.9 (H) 12/29/2020 0839   BNP 102 (H) 06/04/2019 1026    ProBNP No results found for: "PROBNP"   Lipid Panel     Component Value Date/Time   CHOL 150 06/11/2021 0940   CHOL 147 12/05/2015 0901   TRIG 70 06/11/2021 0940   HDL 59 06/11/2021 0940   HDL 55 12/05/2015 0901   CHOLHDL 2.5 06/11/2021 0940   VLDL 21 07/23/2016 0805   LDLCALC 76 06/11/2021 0940     RADIOLOGY: ECHOCARDIOGRAM COMPLETE  Result Date: 10/12/2021    ECHOCARDIOGRAM REPORT   Patient Name:   Garrett Munoz Date of Exam: 10/12/2021 Medical Rec #:  [id number]       Height:       71.0 in Accession #:    [id number]      Weight:       312.0 lb Date of Birth:  Jul 22, 1945      BSA:          2.547 m Patient Age:    76 years        BP:           124/84 mmHg Patient Gender: M               HR:           63 bpm. Exam Location:  Outpatient Procedure: 2D Echo, 3D Echo, Cardiac Doppler, Color Doppler and  Strain Analysis Indications:    Left ventricular hypertrophy [268341]                 Aortic root dilation ([place]) [[id number]]  History:        Patient has prior history of Echocardiogram examinations, most                 recent 12/30/2020. CHF, COPD, Arrythmias:Atrial Fibrillation;                 Risk Factors:Former Smoker, Sleep Apnea and Hypertension.  Sonographer:    Darlina Sicilian RDCS Referring Phys: [place]  1. Left ventricular ejection fraction, by estimation, is 60 to 65%. The left ventricle has normal function. The left ventricle has no regional wall motion abnormalities. There is mild concentric left ventricular hypertrophy. Left ventricular diastolic parameters are indeterminate.  2. Right ventricular systolic function is mildly reduced. The right ventricular size is mildly enlarged. Tricuspid regurgitation signal is inadequate for assessing PA pressure.  3. The mitral valve is normal in structure. No evidence of mitral  valve regurgitation. No evidence of mitral stenosis.  4. The aortic valve is tricuspid. Aortic valve regurgitation is trivial. No aortic stenosis is present.  5. Aortic dilatation noted. There is mild dilatation of the aortic root, measuring 42 mm. There is mild dilatation of the ascending aorta, measuring 41 mm.  6. The inferior vena cava is dilated in size with >50% respiratory variability, suggesting right atrial pressure of 8 mmHg. FINDINGS  Left Ventricle: Left ventricular ejection fraction, by estimation, is 60 to 65%. The left ventricle has normal function. The left ventricle has no regional wall motion abnormalities. The left ventricular internal cavity size was normal in size. There is  mild concentric left ventricular hypertrophy. Left ventricular diastolic parameters are indeterminate. Right Ventricle: The right ventricular size is mildly enlarged. Right ventricular systolic function is mildly reduced. Tricuspid regurgitation signal is inadequate for  assessing PA pressure. The tricuspid regurgitant velocity is 2.59 m/s, and with an assumed right atrial pressure of 8 mmHg, the estimated right ventricular systolic pressure is 50.5 mmHg. Left Atrium: Left atrial size was normal in size. Right Atrium: Right atrial size was normal in size. Pericardium: There is no evidence of pericardial effusion. Mitral Valve: The mitral valve is normal in structure. No evidence of mitral valve regurgitation. No evidence of mitral valve stenosis. Tricuspid Valve: The tricuspid valve is normal in structure. Tricuspid valve regurgitation is mild . No evidence of tricuspid stenosis. Aortic Valve: The aortic valve is tricuspid. Aortic valve regurgitation is trivial. No aortic stenosis is present. Pulmonic Valve: The pulmonic valve was normal in structure. Pulmonic valve regurgitation is mild. No evidence of pulmonic stenosis. Aorta: Aortic dilatation noted. There is mild dilatation of the aortic root, measuring 42 mm. There is mild dilatation of the ascending aorta, measuring 41 mm. Venous: The inferior vena cava is dilated in size with greater than 50% respiratory variability, suggesting right atrial pressure of 8 mmHg. IAS/Shunts: No atrial level shunt detected by color flow Doppler.  LEFT VENTRICLE PLAX 2D LVIDd:         4.80 cm   Diastology LVIDs:         3.50 cm   LV e' medial:    8.55 cm/s LV PW:         1.20 cm   LV E/e' medial:  11.1 LV IVS:        1.40 cm   LV e' lateral:   13.73 cm/s LVOT diam:     2.40 cm   LV E/e' lateral: 6.9 LV SV:         66 LV SV Index:   26 LVOT Area:     4.52 cm                           3D Volume EF:                          3D EF:        60 %                          LV EDV:       171 ml                          LV ESV:       68 ml  LV SV:        103 ml RIGHT VENTRICLE RV Basal diam:  4.20 cm RV Mid diam:    3.90 cm RV S prime:     13.10 cm/s TAPSE (M-mode): 1.6 cm LEFT ATRIUM             Index        RIGHT ATRIUM            Index LA diam:        4.40 cm 1.73 cm/m   RA Area:     17.10 cm LA Vol (A2C):   63.5 ml 24.93 ml/m  RA Volume:   42.80 ml  16.80 ml/m LA Vol (A4C):   62.7 ml 24.62 ml/m LA Biplane Vol: 63.7 ml 25.01 ml/m  AORTIC VALVE             PULMONIC VALVE LVOT Vmax:   84.40 cm/s  PR End Diast Vel: 2.34 msec LVOT Vmean:  53.100 cm/s LVOT VTI:    0.146 m  AORTA Ao Root diam: 4.00 cm Ao Asc diam:  3.80 cm MITRAL VALVE               TRICUSPID VALVE MV Area (PHT): 4.37 cm    TR Peak grad:   26.8 mmHg MV Decel Time: 174 msec    TR Vmax:        259.00 cm/s MV E velocity: 94.60 cm/s                            SHUNTS                            Systemic VTI:  0.15 m                            Systemic Diam: 2.40 cm Kirk Ruths MD Electronically signed by Kirk Ruths MD Signature Date/Time: 10/12/2021/11:57:05 AM    Final     IMPRESSION:  1. Permanent atrial fibrillation ([place])   2. Essential hypertension   3. Chronic anticoagulation - Eliquis   4. OSA (obstructive sleep apnea)   5. Morbid obesity, unspecified obesity type ([place])   6. Chronic diastolic heart failure ([place])   7. LVH (left ventricular hypertrophy)     ASSESSMENT AND PLAN: Mr. Cougar Imel is a 76 year-old gentleman who has a history of morbid obesity, hypertension, obstructive sleep apnea, and permanent atrial fibrillation.  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  An echo Doppler study showed an EF demonstrated normal systolic function but he had severe left ventricular hypertrophy.  I suspect he had diastolic heart failure.  He was noted to have mild dilatation of his aortic root and ascending aorta.  He had mild to moderate left atrial dilatation.  He has been on chronic anticoagulation with Eliquis currently at 5 mg twice a day. He developed pneumonia and also has had COPD exacerbation.  He  was told that he was aspirating and underwent an ENT  evaluation as well as follow-up pulmonary evaluation with Dr. [name].  He continues to be on prednisone for his polymyalgia rheumatica.  When last seen by me he had bilateral lower extremity edema which has improved with increased furosemide.  His blood pressure today is stable on his current regimen of losartan, furosemide,  carvedilol.  He is on albuterol as needed.  I reviewed his most recent echo Doppler study from October 12, 2021 and LV function remains stable with mild LVH.  There is mild dilatation of his aortic root and ascending aorta.  He has BiPAP for his obstructive sleep apnea and daily use was again strongly recommended.  His atrial fibrillation rate is well controlled at 67 on his current dose of carvedilol.  BMI is consistent with morbid obesity at 43.5 and weight loss and exercise is recommended.  Since he has improved, I will cancel his appointment scheduled for next month.  He will follow-up with Dr. Dennard Schaumann.  I will see him in 1 year for follow-up Cardiologic evaluation or sooner as needed.   [name], MD, [facility]  10/20/2021 9:02 AM

## 2021-10-16 ENCOUNTER — Other Ambulatory Visit (HOSPITAL_COMMUNITY): Payer: Self-pay

## 2021-10-16 DIAGNOSIS — I4891 Unspecified atrial fibrillation: Secondary | ICD-10-CM | POA: Diagnosis not present

## 2021-10-16 DIAGNOSIS — M5136 Other intervertebral disc degeneration, lumbar region: Secondary | ICD-10-CM | POA: Diagnosis not present

## 2021-10-16 DIAGNOSIS — M5459 Other low back pain: Secondary | ICD-10-CM | POA: Diagnosis not present

## 2021-10-16 DIAGNOSIS — I509 Heart failure, unspecified: Secondary | ICD-10-CM | POA: Diagnosis not present

## 2021-10-16 DIAGNOSIS — Z79891 Long term (current) use of opiate analgesic: Secondary | ICD-10-CM | POA: Diagnosis not present

## 2021-10-16 DIAGNOSIS — M545 Low back pain, unspecified: Secondary | ICD-10-CM | POA: Diagnosis not present

## 2021-10-16 MED ORDER — HYDROCODONE-ACETAMINOPHEN 10-325 MG PO TABS
1.0000 | ORAL_TABLET | Freq: Three times a day (TID) | ORAL | 0 refills | Status: DC | PRN
  Filled 2021-10-16: qty 15, 5d supply, fill #0

## 2021-10-19 DIAGNOSIS — M545 Low back pain, unspecified: Secondary | ICD-10-CM | POA: Diagnosis not present

## 2021-10-20 ENCOUNTER — Encounter: Payer: Self-pay | Admitting: Cardiovascular Disease

## 2021-10-27 ENCOUNTER — Other Ambulatory Visit: Payer: Self-pay | Admitting: Family Medicine

## 2021-10-28 ENCOUNTER — Other Ambulatory Visit (HOSPITAL_COMMUNITY): Payer: Self-pay

## 2021-10-28 MED ORDER — LEVOCETIRIZINE DIHYDROCHLORIDE 5 MG PO TABS
5.0000 mg | ORAL_TABLET | Freq: Every evening | ORAL | 0 refills | Status: DC
  Filled 2021-10-28: qty 30, 30d supply, fill #0

## 2021-10-28 NOTE — Telephone Encounter (Signed)
LOV 09/03/21 Last refill 06/30/21, #30, 2 refills  Please review, thanks!

## 2021-10-29 ENCOUNTER — Other Ambulatory Visit (HOSPITAL_COMMUNITY): Payer: Self-pay

## 2021-10-29 MED ORDER — TEMAZEPAM 30 MG PO CAPS
30.0000 mg | ORAL_CAPSULE | Freq: Every evening | ORAL | 2 refills | Status: AC | PRN
  Filled 2021-10-29: qty 30, 30d supply, fill #0
  Filled 2021-11-27: qty 30, 30d supply, fill #1

## 2021-10-30 ENCOUNTER — Other Ambulatory Visit: Payer: Self-pay | Admitting: Cardiovascular Disease

## 2021-10-30 ENCOUNTER — Other Ambulatory Visit: Payer: Self-pay | Admitting: Specialist

## 2021-10-30 DIAGNOSIS — G8929 Other chronic pain: Secondary | ICD-10-CM

## 2021-10-30 DIAGNOSIS — M545 Low back pain, unspecified: Secondary | ICD-10-CM | POA: Diagnosis not present

## 2021-11-04 ENCOUNTER — Other Ambulatory Visit (HOSPITAL_COMMUNITY): Payer: Self-pay

## 2021-11-04 ENCOUNTER — Other Ambulatory Visit: Payer: Self-pay | Admitting: Family Medicine

## 2021-11-04 ENCOUNTER — Other Ambulatory Visit: Payer: Self-pay | Admitting: Cardiovascular Disease

## 2021-11-04 ENCOUNTER — Other Ambulatory Visit: Payer: Self-pay

## 2021-11-04 ENCOUNTER — Telehealth: Payer: Self-pay | Admitting: *Deleted

## 2021-11-04 ENCOUNTER — Other Ambulatory Visit: Payer: Self-pay | Admitting: Cardiology

## 2021-11-04 NOTE — Telephone Encounter (Signed)
   Pre-operative Risk Assessment    Patient Name: Garrett Munoz  DOB: 05/04/1945 MRN: [id number]     [name] THIS IS THE 2ND REQUEST, THOUGHT THIS IS THE FIRST REQUEST THAT I HAVE SEEN  Request for Surgical Clearance    Procedure:   NERVE ROOT BLOCK INJECTION  Date of Surgery:  Clearance TBD                                 Surgeon:  NOT LISTED Surgeon's Group or Practice Name:  [facility]  Phone number:  [phone] Fax number:  [phone]   Type of Clearance Requested:   - Medical  - Pharmacy:  Hold Apixaban (Eliquis) x 2 DAYS PRIOR   Type of Anesthesia:  Local    Additional requests/questions:    [name]   11/04/2021, 5:36 PM

## 2021-11-05 ENCOUNTER — Other Ambulatory Visit (HOSPITAL_COMMUNITY): Payer: Self-pay

## 2021-11-05 MED ORDER — OXYCODONE-ACETAMINOPHEN 10-325 MG PO TABS
1.0000 | ORAL_TABLET | Freq: Four times a day (QID) | ORAL | 0 refills | Status: DC | PRN
  Filled 2021-11-05: qty 90, 30d supply, fill #0

## 2021-11-06 NOTE — Telephone Encounter (Signed)
Dr. Claiborne Billings, you saw this patient on 10/14/21 and we have now received request for nerve root block injection. Could you please comment on medical clearance for this patient? Please send your response to  p cv div preop  Thank you, Emmaline Life, NP-C    11/06/2021, 9:02 AM [place] [address], [address] Office [phone] Fax [phone]

## 2021-11-06 NOTE — Telephone Encounter (Signed)
Our office received another clearance request today stating 3rd attempt. Clearance was faxed over to [place] on Wed 11/04/21 after 5 pm, which was entered in and sent to pre op for review. I have asked the pre op provider if she can review this pt next.   I did leave a message for Tye Maryland the surgery scheduler at [facility] that I never saw the first fax come over, as I stated I saw the fax stating 2nd attempt, which I entered in to epic and sent right to pre op. Today we received another stating 3rd attempt. I left a message for Tye Maryland that our practice strives on caring for our pt's in all of their needs in a timely manner. It is not our intentions to delay any of our pt's surgeries or procedures. Once the pt has been cleared we will be sure to fax over the clearance notes.

## 2021-11-06 NOTE — Telephone Encounter (Signed)
Patient with diagnosis of A Fib on Eliquis for anticoagulation.    Procedure: NERVE ROOT BLOCK INJECTION Date of procedure: TBD   CHA2DS2-VASc Score = 4  This indicates a 4.8% annual risk of stroke. The patient's score is based upon: CHF History: 1 HTN History: 1 Diabetes History: 0 Stroke History: 0 Vascular Disease History: 0 Age Score: 2 Gender Score: 0    CrCl 90 mL/min using adjusted body weight Platelet count 141K   Per office protocol, patient can hold Eliquis for 2 days prior to procedure.     **This guidance is not considered finalized until pre-operative APP has relayed final recommendations.**

## 2021-11-09 NOTE — Telephone Encounter (Signed)
I will forward this to pre op pool for final notes before I call the pt.

## 2021-11-09 NOTE — Telephone Encounter (Signed)
I will forward clearance notes to requesting office.

## 2021-11-09 NOTE — Telephone Encounter (Signed)
   Name: Garrett Munoz  DOB: [birth year]  MRN: [id number]   Primary Cardiologist: Shelva Majestic, MD  Chart reviewed as part of pre-operative protocol coverage. Patient was contacted 11/09/2021 in reference to pre-operative risk assessment for pending surgery as outlined below.  Spoke with patient's wife (DPR on file).  Garrett Munoz was last seen on 10/14/2021 by Dr. Claiborne Billings.  Since that day, Garrett Munoz has done well from a cardiac standpoint.  He has not had any new symptoms.  He is able to complete >4 METS.  Therefore, based on ACC/AHA guidelines, the patient would be at acceptable risk for the planned procedure without further cardiovascular testing.   Patient with diagnosis of A Fib on Eliquis for anticoagulation.     Procedure: NERVE ROOT BLOCK INJECTION Date of procedure: TBD     CHA2DS2-VASc Score = 4  This indicates a 4.8% annual risk of stroke. The patient's score is based upon: CHF History: 1 HTN History: 1 Diabetes History: 0 Stroke History: 0 Vascular Disease History: 0 Age Score: 2 Gender Score: 0     CrCl 90 mL/min using adjusted body weight Platelet count 141K     Per office protocol, patient can hold Eliquis for 2 days prior to procedure. Please resume Eliquis as soon as possible postprocedure, at the discretion of the surgeon.  The patient was advised that if he develops new symptoms prior to surgery to contact our office to arrange for a follow-up visit, and he verbalized understanding.  I will route this recommendation to the requesting party via Epic fax function and remove from pre-op pool. Please call with questions.  Lenna Sciara, NP 11/09/2021, 10:51 AM

## 2021-11-09 NOTE — Telephone Encounter (Signed)
Spouse calling to f/u on Clearance as to whether or not pt's Eliquis can be held. Please advise

## 2021-11-12 ENCOUNTER — Ambulatory Visit
Admission: RE | Admit: 2021-11-12 | Discharge: 2021-11-12 | Disposition: A | Discharge status: Home / Self Care | Payer: PPO | Source: Ambulatory Visit | Attending: Specialist | Admitting: Specialist

## 2021-11-12 DIAGNOSIS — M545 Low back pain, unspecified: Secondary | ICD-10-CM

## 2021-11-12 DIAGNOSIS — M5126 Other intervertebral disc displacement, lumbar region: Secondary | ICD-10-CM | POA: Diagnosis not present

## 2021-11-12 DIAGNOSIS — M47817 Spondylosis without myelopathy or radiculopathy, lumbosacral region: Secondary | ICD-10-CM | POA: Diagnosis not present

## 2021-11-12 MED ORDER — IOPAMIDOL (ISOVUE-M 200) INJECTION 41%
1.0000 mL | Freq: Once | INTRAMUSCULAR | Status: AC
  Administered 2021-11-12: 1 mL via EPIDURAL

## 2021-11-12 MED ORDER — METHYLPREDNISOLONE ACETATE 40 MG/ML INJ SUSP (RADIOLOG
80.0000 mg | Freq: Once | INTRAMUSCULAR | Status: AC
  Administered 2021-11-12: 80 mg via EPIDURAL

## 2021-11-12 NOTE — Discharge Instructions (Signed)

## 2021-11-23 ENCOUNTER — Other Ambulatory Visit (HOSPITAL_COMMUNITY): Payer: PPO

## 2021-11-27 ENCOUNTER — Other Ambulatory Visit (HOSPITAL_COMMUNITY): Payer: Self-pay

## 2021-11-27 ENCOUNTER — Other Ambulatory Visit: Payer: Self-pay | Admitting: Family Medicine

## 2021-11-27 NOTE — Telephone Encounter (Signed)
Requested medication (s) are due for refill today:   Yes  Requested medication (s) are on the active medication list:   Yes  Future visit scheduled:   No   Last ordered: 10/28/2021 #30, 0 refills  Returned because a new rx is needed.   Requested Prescriptions  Pending Prescriptions Disp Refills   levocetirizine (XYZAL ALLERGY 24HR) 5 MG tablet 30 tablet 0    Sig: Take 1 tablet (5 mg total) by mouth every evening.     Ear, Nose, and Throat:  Antihistamines - levocetirizine dihydrochloride Passed - 11/27/2021  9:52 AM      Passed - Cr in normal range and within 360 days    Creat  Date Value Ref Range Status  06/11/2021 0.99 0.70 - 1.28 mg/dL Final         Passed - eGFR is 10 or above and within 360 days    GFR, Est African American  Date Value Ref Range Status  05/15/2020 99 > OR = 60 mL/min/1.8m Final   GFR, Est Non African American  Date Value Ref Range Status  05/15/2020 85 > OR = 60 mL/min/1.729mFinal   GFR, Estimated  Date Value Ref Range Status  01/08/2021 >60 >60 mL/min Final    Comment:    (NOTE) Calculated using the CKD-EPI Creatinine Equation (2021)    eGFR  Date Value Ref Range Status  06/11/2021 78 > OR = 60 mL/min/1.7359minal    Comment:    The eGFR is based on the CKD-EPI 2021 equation. To calculate  the new eGFR from a previous Creatinine or Cystatin C result, go to https://www.kidney.org/professionals/ kdoqi/gfr%5Fcalculator          Passed - Valid encounter within last 12 months    Recent Outpatient Visits           2 months ago DDD (degenerative disc disease), lumbar   [facility]   3 months ago Acute cough   [place] [place]   5 months ago DDD (degenerative disc disease), lumbar   [place]   5 months ago Chronic obstructive pulmonary disease, unspecified COPD type ([place] [facility]    7 months ago Chronic obstructive pulmonary disease, unspecified COPD type ([facility] [facility] [place] [place] [place]

## 2021-11-30 ENCOUNTER — Telehealth: Payer: Self-pay | Admitting: Pharmacist

## 2021-11-30 ENCOUNTER — Other Ambulatory Visit (HOSPITAL_COMMUNITY): Payer: Self-pay

## 2021-11-30 ENCOUNTER — Ambulatory Visit: Payer: PPO | Admitting: Cardiovascular Disease

## 2021-11-30 MED ORDER — LEVOCETIRIZINE DIHYDROCHLORIDE 5 MG PO TABS
5.0000 mg | ORAL_TABLET | Freq: Every evening | ORAL | 3 refills | Status: AC
  Filled 2021-11-30: qty 30, 30d supply, fill #0

## 2021-11-30 NOTE — Progress Notes (Signed)
Chronic Care Management Pharmacy Assistant   Name: Garrett Munoz  MRN: [id number] DOB: 28-May-1945   Reason for Encounter: Disease State - Hypertension Call     Recent office visits:  09/03/21 Jenna Luo, MD - Family Medicine - DDD - oxyCODONE-acetaminophen (PERCOCET) 10-325 MG tablet prescribed. MRI Lumbar spine ordered. Follow up as needed.   08/03/21 Jenna Luo, MD - Family Medicine - CXR ordered. amoxicillin-clavulanate (AUGMENTIN XR) 1000-62.5 MG 12 hr tablet prescribed. CT chest ordered. Follow up as needed.   06/16/21 Jenna Luo, MD - Family Medicine - DDD - Referral to Orthopedic surgery. predniSONE (DELTASONE) 20 MG tablet prescribed. Follow up as needed.   06/11/21 Gentry Roch, MD - Family Medicine - COPD - Labs were ordered. oxyCODONE-acetaminophen (PERCOCET) 7.5-325 MG tablet prescribed. Follow up as needed.   Recent consult visits:  10/30/21 Patric Dykes - Orthopedics - No notes available.  10/16/21 [name], MD - Orthopedics - No notes available.   10/14/21 Shelva Majestic, MD - Cardiology - Afib - No medication changes. Follow up in 1 year.   06/30/21 [name], MD - Physical Medicine - Radiculopathy Lumbar spine - No notes available.    Hospital visits:  None in previous 6 months  Medications: Outpatient Encounter Medications as of 11/30/2021  Medication Sig   albuterol (ACCUNEB) 1.25 MG/3ML nebulizer solution Take 1 ampule by nebulization every 4 (four) hours as needed for wheezing.   albuterol (VENTOLIN HFA) 108 (90 Base) MCG/ACT inhaler Inhale 2 puffs into the lungs every 6 (six) hours as needed for wheezing or shortness of breath.   allopurinol (ZYLOPRIM) 300 MG tablet Take 1 tablet by mouth daily.   apixaban (ELIQUIS) 5 MG TABS tablet Take 1 tablet (5 mg total) by mouth 2 (two) times daily.   carvedilol (COREG) 12.5 MG tablet Take 1.5 tablets (18.75 mg total) by mouth every morning and 1 tablet (12.5 mg) in the evening.   fluticasone  (FLONASE) 50 MCG/ACT nasal spray SHAKE LIQUID AND USE 2 SPRAYS IN EACH NOSTRIL DAILY   furosemide (LASIX) 20 MG tablet Take 1 tablet (20 mg total) by mouth daily.   gabapentin (NEURONTIN) 300 MG capsule Take 2 capsules (600 mg total) by mouth 3 (three) times daily.   HYDROcodone-acetaminophen (NORCO) 10-325 MG tablet Take 1 tablet by mouth 3 (three) times daily as needed for pain   levocetirizine (XYZAL ALLERGY 24HR) 5 MG tablet Take 1 tablet (5 mg total) by mouth every evening.   losartan (COZAAR) 25 MG tablet Take 1 tablet (25 mg total) by mouth daily.   magic mouthwash w/lidocaine SOLN Swish and spit 15m PO TID x5 days, then PRN- mouth pain. Steroid, Benadryl, Nystatin, Lidocaine 1:1 ratio.   oxyCODONE-acetaminophen (PERCOCET) 10-325 MG tablet Take 1 tablet by mouth every 6 (six) hours as needed for pain.   predniSONE (DELTASONE) 10 MG tablet Take 1 tablet (10 mg total) by mouth daily with breakfast.   temazepam (RESTORIL) 30 MG capsule Take 1 capsule (30 mg total) by mouth at bedtime as needed for sleep   No facility-administered encounter medications on file as of 11/30/2021.    Current antihypertensive regimen:  carvedilol (COREG) 12.5 MG tablet furosemide (LASIX) 20 MG tablet losartan (COZAAR) 25 MG tablet   How often are you checking your Blood Pressure?   Current home BP readings:     What recent interventions/DTPs have been made by any provider to improve Blood Pressure control since last CPP Visit:  Patient reported no changes in medication  regimen since last visit with CPP   Any recent hospitalizations or ED visits since last visit with CPP? Patient has not had any hospitalizations or ED visits since last visit with CPP.    What diet changes have been made to improve Blood Pressure Control?     What exercise is being done to improve your Blood Pressure Control?     Adherence Review: Is the patient currently on ACE/ARB medication? Yes Does the patient have >5 day gap  between last estimated fill dates? No    Care Gaps  AWV: done 10/08/20 Colonoscopy: done 01/14/17 DM Eye Exam: N/A DM Foot Exam: N/A Microalbumin: N/A HbgAIC: N/A DEXA: N/A Mammogram: N/A   Star Rating Drugs: Losartan (COZAAR) 25 MG tablet - last filled 10/01/21   No future appointments.   Multiple attempts were made to contact patient. Attempts were unsuccessful. / ls,CMA   Jobe Gibbon, [place] Pharmacist Assistant  [phone]

## 2021-11-30 NOTE — Telephone Encounter (Signed)
Pt's wife called in inquiring about a refill of this med levocetirizine (XYZAL ALLERGY 24HR) 5 MG tablet [[id number]]  DISCONTINUED  Please call cb#: [phone]

## 2021-12-01 ENCOUNTER — Other Ambulatory Visit (HOSPITAL_COMMUNITY): Payer: Self-pay

## 2021-12-10 ENCOUNTER — Ambulatory Visit (INDEPENDENT_AMBULATORY_CARE_PROVIDER_SITE_OTHER): Payer: PPO | Admitting: Family Medicine

## 2021-12-10 ENCOUNTER — Other Ambulatory Visit (HOSPITAL_COMMUNITY): Payer: Self-pay

## 2021-12-10 VITALS — BP 118/70 | HR 57 | Temp 97.5°F | Ht 71.0 in | Wt 317.0 lb

## 2021-12-10 DIAGNOSIS — R051 Acute cough: Secondary | ICD-10-CM | POA: Diagnosis not present

## 2021-12-10 DIAGNOSIS — N3281 Overactive bladder: Secondary | ICD-10-CM | POA: Diagnosis not present

## 2021-12-10 DIAGNOSIS — J189 Pneumonia, unspecified organism: Secondary | ICD-10-CM

## 2021-12-10 DIAGNOSIS — M545 Low back pain, unspecified: Secondary | ICD-10-CM | POA: Diagnosis not present

## 2021-12-10 MED ORDER — OXYBUTYNIN CHLORIDE 5 MG PO TABS
5.0000 mg | ORAL_TABLET | Freq: Every day | ORAL | 1 refills | Status: AC
  Filled 2021-12-10: qty 30, 30d supply, fill #0

## 2021-12-10 MED ORDER — AMOXICILLIN-POT CLAVULANATE 875-125 MG PO TABS
1.0000 | ORAL_TABLET | Freq: Two times a day (BID) | ORAL | 0 refills | Status: AC
  Filled 2021-12-10: qty 20, 10d supply, fill #0

## 2021-12-10 NOTE — Progress Notes (Signed)
Subjective:    Patient ID: Garrett Munoz, male    DOB: June 13, 1945, 76 y.o.   MRN: [id number] Patient reports a cough that is positive for green and blood-tinged sputum.  He reports increasing chest congestion.  This been going on for more than a week.  He has a history of aspiration pneumonia.  Today on his physical exam, there is right basilar crackles and rails with rhonchorous breath sounds.  He denies any fevers or chills or pleurisy.  He also reports urinary frequency.  He states that he has to get up at night every hour to urinate.  He states that he has sudden urges to urinate.  He reports a strong urinary stream.  This sounds more like bladder spasms due to overactive bladder Past Medical History:  Diagnosis Date   Atrial fibrillation (HCC)    Bronchitis    hx   Cataract    right eye   CHF NYHA class III, acute on chronic, diastolic (HCC)    COPD (chronic obstructive pulmonary disease) (HCC)    DDD (degenerative disc disease), lumbosacral    Dysrhythmia    irregular heatbeat   Eye worm    right eye   Gout    History of kidney stones    Hypertension    Left knee DJD    Morbid obesity with BMI of 40.0-44.9, adult (HCC)    Pneumonia    hx   Prediabetes    Radicular syndrome of left leg    S/P total knee replacement    right   Sleep apnea    can't use sleep study 10 yrs ago   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    Past Surgical History:  Procedure Laterality Date   CARDIOVERSION N/A 03/04/2015   Procedure: CARDIOVERSION;  Surgeon: [name], MD;  Location: [place];  Service: Cardiovascular;  Laterality: N/A;   CHOLECYSTECTOMY     COLONOSCOPY N/A 01/01/2016   Procedure: COLONOSCOPY;  Surgeon: [name], MD;  [facility];  Service: Endoscopy;  Laterality: N/A;  1:45   COLONOSCOPY N/A 01/14/2017   Procedure: COLONOSCOPY;  Surgeon: [name], MD;  [facility];  Service: Endoscopy;  Laterality: N/A;  12:05   EYE EXAMINATION UNDER  ANESTHESIA W/ RETINAL CRYOTHERAPY AND RETINAL LASER  07/2011   EYE SURGERY  02/2011   cat rt   KNEE ARTHROSCOPY     bilateral   LUMBAR LAMINECTOMY/DECOMPRESSION MICRODISCECTOMY N/A 08/18/2017   Procedure: Bilateral microlumbar decompression Lumbar four-Lumbar five;  Surgeon: [name], MD;  Location: [place];  Service: Orthopedics;  Laterality: N/A;   POLYPECTOMY  01/01/2016   Procedure: POLYPECTOMY;  Surgeon: [name], MD;  [facility];  Service: Endoscopy;;  colon   RETINAL DETACHMENT SURGERY  03/2011   rt   TOTAL KNEE ARTHROPLASTY  2003   rt   TOTAL KNEE ARTHROPLASTY  02/07/2012   Procedure: TOTAL KNEE ARTHROPLASTY;  Surgeon: [name], MD;  Location: [place];  Service: Orthopedics;  Laterality: Left;   Current Outpatient Medications on File Prior to Visit  Medication Sig Dispense Refill   albuterol (ACCUNEB) 1.25 MG/3ML nebulizer solution Take 1 ampule by nebulization every 4 (four) hours as needed for wheezing.     albuterol (VENTOLIN HFA) 108 (90 Base) MCG/ACT inhaler Inhale 2 puffs into the lungs every 6 (six) hours as needed for wheezing or shortness of breath. 8.5 g 0   allopurinol (ZYLOPRIM) 300 MG tablet  Take 1 tablet by mouth daily.     apixaban (ELIQUIS) 5 MG TABS tablet Take 1 tablet (5 mg total) by mouth 2 (two) times daily. 180 tablet 1   carvedilol (COREG) 12.5 MG tablet Take 1.5 tablets (18.75 mg total) by mouth every morning and 1 tablet (12.5 mg) in the evening. 135 tablet 3   fluticasone (FLONASE) 50 MCG/ACT nasal spray SHAKE LIQUID AND USE 2 SPRAYS IN EACH NOSTRIL DAILY 16 g 6   furosemide (LASIX) 20 MG tablet Take 1 tablet (20 mg total) by mouth daily. 90 tablet 3   gabapentin (NEURONTIN) 300 MG capsule Take 2 capsules (600 mg total) by mouth 3 (three) times daily. 540 capsule 3   HYDROcodone-acetaminophen (NORCO) 10-325 MG tablet Take 1 tablet by mouth 3 (three) times daily as needed for pain 15 tablet 0   levocetirizine (XYZAL ALLERGY 24HR) 5  MG tablet Take 1 tablet (5 mg total) by mouth every evening. 30 tablet 3   losartan (COZAAR) 25 MG tablet Take 1 tablet (25 mg total) by mouth daily. 90 tablet 3   magic mouthwash w/lidocaine SOLN Swish and spit 39m PO TID x5 days, then PRN- mouth pain. Steroid, Benadryl, Nystatin, Lidocaine 1:1 ratio. 100 mL 0   oxyCODONE-acetaminophen (PERCOCET) 10-325 MG tablet Take 1 tablet by mouth every 6 (six) hours as needed for pain. 90 tablet 0   predniSONE (DELTASONE) 10 MG tablet Take 1 tablet (10 mg total) by mouth daily with breakfast. 90 tablet 3   temazepam (RESTORIL) 30 MG capsule Take 1 capsule (30 mg total) by mouth at bedtime as needed for sleep 30 capsule 2   No current facility-administered medications on file prior to visit.   No Known Allergies Social History   Socioeconomic History   Marital status: Married    Spouse name: Not on file   Number of children: Not on file   Years of education: Not on file   Highest education level: Not on file  Occupational History   Not on file  Tobacco Use   Smoking status: Former    Packs/day: 2.00    Years: 15.00    Total pack years: 30.00    Types: Cigarettes    Quit date: 02/02/1992    Years since quitting: 29.8   Smokeless tobacco: Never   Tobacco comments:    occ alcohol  Vaping Use   Vaping Use: Never used  Substance and Sexual Activity   Alcohol use: Yes    Alcohol/week: 0.0 standard drinks of alcohol    Comment: occasional   Drug use: No   Sexual activity: Not on file  Other Topics Concern   Not on file  Social History Narrative   Not on file   Social Determinants of Health   Financial Resource Strain: Low Risk  (09/07/2019)   Overall Financial Resource Strain (CARDIA)    Difficulty of Paying Living Expenses: Not very hard  Food Insecurity: Not on file  Transportation Needs: Not on file  Physical Activity: Not on file  Stress: Not on file  Social Connections: Not on file  Intimate Partner Violence: Not on file       Review of Systems  All other systems reviewed and are negative.      Objective:   Physical Exam Vitals reviewed.  Constitutional:      General: He is not in acute distress.    Appearance: He is well-developed. He is not diaphoretic.  Neck:     Thyroid: No thyromegaly.  Vascular: No JVD.     Trachea: No tracheal deviation.  Cardiovascular:     Rate and Rhythm: Normal rate. Rhythm irregularly irregular.     Heart sounds: Normal heart sounds.  Pulmonary:     Effort: Pulmonary effort is normal.     Breath sounds: Decreased air movement present. Examination of the right-lower field reveals rhonchi and rales. Rhonchi and rales present. No decreased breath sounds or wheezing.  Chest:     Chest wall: No tenderness.  Abdominal:     Tenderness: There is no rebound.  Musculoskeletal:     Cervical back: Neck supple.     Lumbar back: Tenderness present. No spasms or bony tenderness. Decreased range of motion.       Back:     Right hip: No tenderness or bony tenderness. Normal range of motion.     Left hip: No tenderness. Normal range of motion.     Right lower leg: No edema.     Left lower leg: No edema.  Lymphadenopathy:     Cervical: No cervical adenopathy.  Skin:    Coloration: Skin is not pale.     Findings: No erythema or rash.  Neurological:     Mental Status: He is alert and oriented to person, place, and time.     Cranial Nerves: No cranial nerve deficit.     Motor: No abnormal muscle tone.     Coordination: Coordination normal.     Deep Tendon Reflexes: Reflexes normal.  Psychiatric:        Behavior: Behavior normal.        Thought Content: Thought content normal.        Judgment: Judgment normal.           Assessment & Plan:  OAB (overactive bladder)  Acute cough  Pneumonia of right lower lobe due to infectious organism Patient reports purulent and bloody sputum and he has a history of aspiration pneumonia.  I will start the patient on Augmentin  875 mg twice daily for 10 days.  I will treat overactive bladder with oxybutynin 5 mg p.o. nightly.  Increase the frequency of the medication beneficial.

## 2021-12-11 ENCOUNTER — Other Ambulatory Visit: Payer: Self-pay | Admitting: Specialist

## 2021-12-14 ENCOUNTER — Other Ambulatory Visit: Payer: Self-pay | Admitting: Specialist

## 2021-12-14 DIAGNOSIS — G8929 Other chronic pain: Secondary | ICD-10-CM

## 2021-12-15 ENCOUNTER — Other Ambulatory Visit: Payer: Self-pay

## 2021-12-15 ENCOUNTER — Other Ambulatory Visit (HOSPITAL_COMMUNITY): Payer: Self-pay

## 2021-12-15 DIAGNOSIS — M545 Low back pain, unspecified: Secondary | ICD-10-CM | POA: Diagnosis not present

## 2021-12-15 DIAGNOSIS — M353 Polymyalgia rheumatica: Secondary | ICD-10-CM | POA: Diagnosis not present

## 2021-12-15 DIAGNOSIS — M5416 Radiculopathy, lumbar region: Secondary | ICD-10-CM | POA: Diagnosis not present

## 2021-12-15 MED ORDER — AMITRIPTYLINE HCL 50 MG PO TABS
50.0000 mg | ORAL_TABLET | Freq: Every day | ORAL | 1 refills | Status: AC
  Filled 2021-12-15: qty 30, 30d supply, fill #0

## 2021-12-15 MED ORDER — HYDROCODONE-ACETAMINOPHEN 10-325 MG PO TABS
1.0000 | ORAL_TABLET | Freq: Three times a day (TID) | ORAL | 0 refills | Status: AC | PRN
  Filled 2021-12-15: qty 15, 5d supply, fill #0

## 2021-12-16 ENCOUNTER — Other Ambulatory Visit (HOSPITAL_COMMUNITY): Payer: Self-pay

## 2021-12-17 ENCOUNTER — Ambulatory Visit
Admission: RE | Admit: 2021-12-17 | Discharge: 2021-12-17 | Disposition: A | Discharge status: Home / Self Care | Payer: PPO | Source: Ambulatory Visit | Attending: Specialist | Admitting: Specialist

## 2021-12-17 DIAGNOSIS — M545 Low back pain, unspecified: Secondary | ICD-10-CM

## 2021-12-17 DIAGNOSIS — M47817 Spondylosis without myelopathy or radiculopathy, lumbosacral region: Secondary | ICD-10-CM | POA: Diagnosis not present

## 2021-12-17 MED ORDER — IOPAMIDOL (ISOVUE-M 200) INJECTION 41%
1.0000 mL | Freq: Once | INTRAMUSCULAR | Status: AC
  Administered 2021-12-17: 1 mL via EPIDURAL

## 2021-12-17 MED ORDER — METHYLPREDNISOLONE ACETATE 40 MG/ML INJ SUSP (RADIOLOG
80.0000 mg | Freq: Once | INTRAMUSCULAR | Status: AC
  Administered 2021-12-17: 80 mg via EPIDURAL

## 2021-12-17 NOTE — Discharge Instructions (Signed)

## 2022-01-14 ENCOUNTER — Other Ambulatory Visit (HOSPITAL_COMMUNITY): Payer: Self-pay

## 2022-01-17 DEATH — deceased

## 2022-09-26 IMAGING — DX DG CHEST 1V PORT
1 series · 1 of 1 positions shown · non-contrast
Comparison: 06/04/2019

CLINICAL DATA: Shortness of breath

EXAM:
PORTABLE CHEST 1 VIEW

[chest ap]
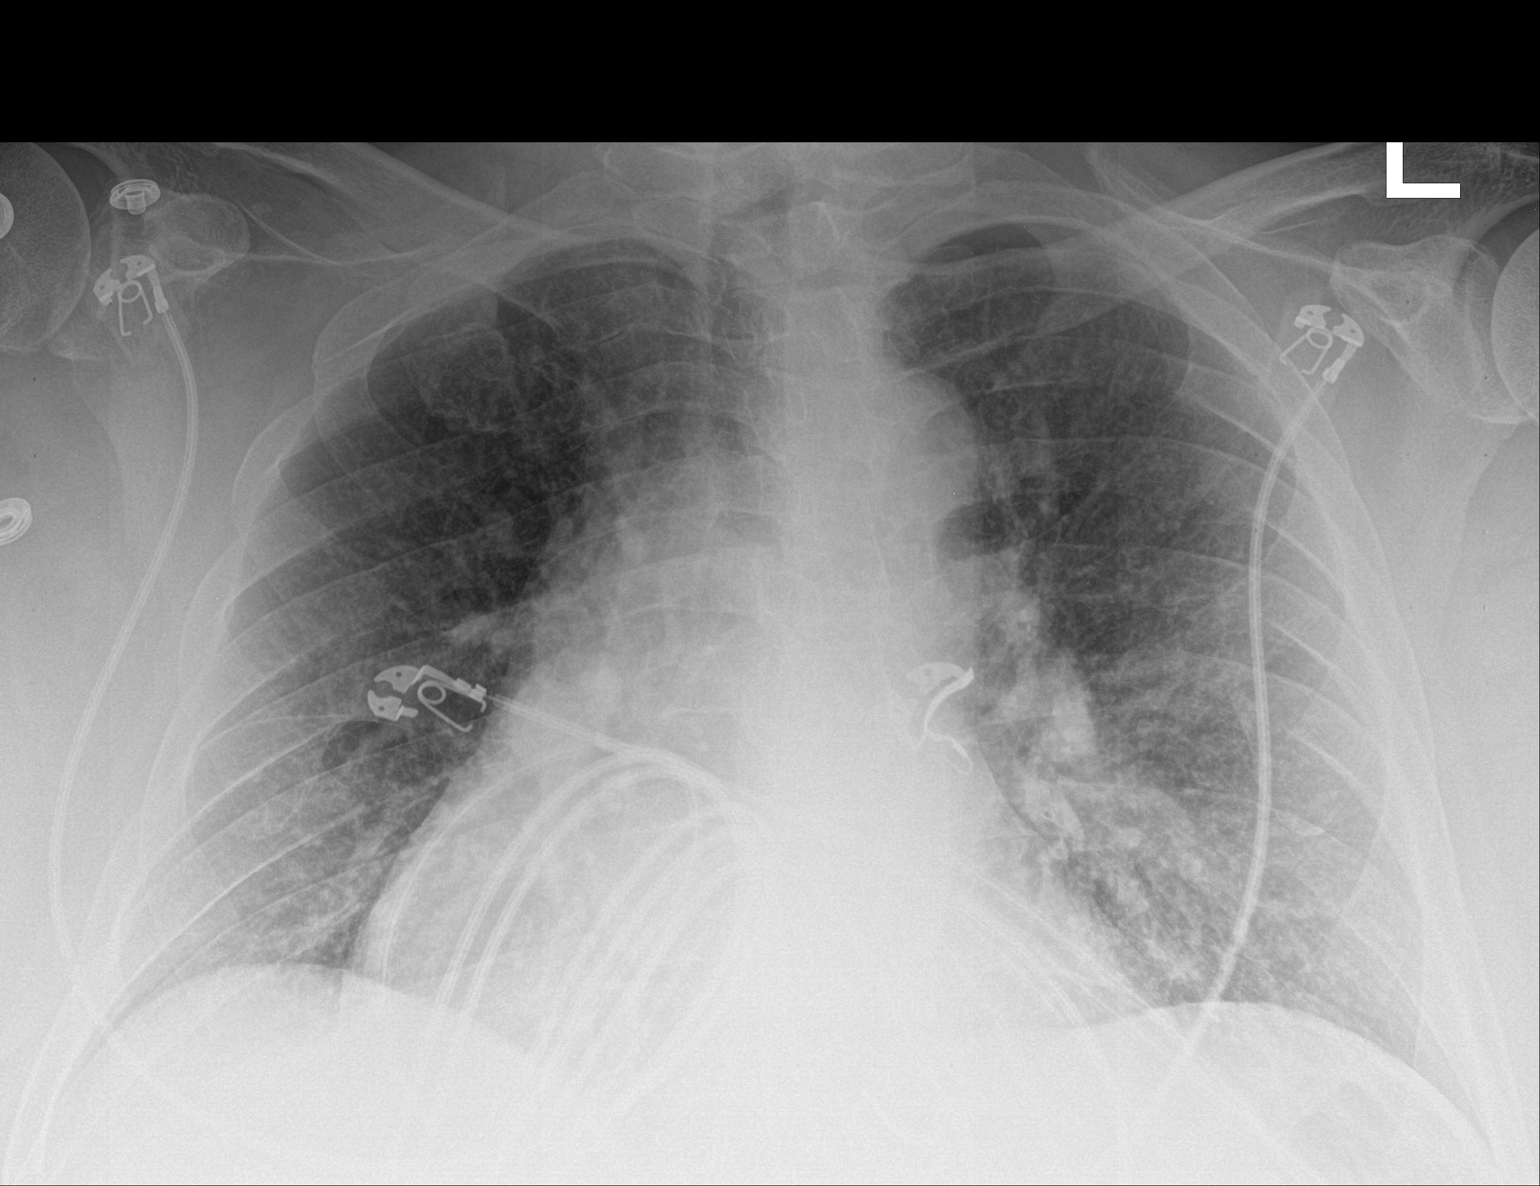

[1 of 1 positions shown; findings below may reference images not displayed]

FINDINGS: Increased interstitial prominence with ill-defined patchy density.
Central pulmonary vascular congestion. No pleural effusion or
pneumothorax. Cardiomegaly.
IMPRESSION: Findings suggestive of pulmonary edema.  Cardiomegaly.
